# Patient Record
Sex: Female | Born: 1975 | Race: Black or African American | Hispanic: No | Smoking: Former smoker
Health system: Southern US, Community
[De-identification: ages and names within clinical notes are randomized; demographics above are authoritative.]

## PROBLEM LIST (undated history)

## (undated) ENCOUNTER — Inpatient Hospital Stay (HOSPITAL_COMMUNITY): Payer: Self-pay

## (undated) DIAGNOSIS — E079 Disorder of thyroid, unspecified: Secondary | ICD-10-CM

## (undated) DIAGNOSIS — F319 Bipolar disorder, unspecified: Secondary | ICD-10-CM

## (undated) DIAGNOSIS — I10 Essential (primary) hypertension: Secondary | ICD-10-CM

## (undated) DIAGNOSIS — E119 Type 2 diabetes mellitus without complications: Secondary | ICD-10-CM

## (undated) DIAGNOSIS — G709 Myoneural disorder, unspecified: Secondary | ICD-10-CM

## (undated) DIAGNOSIS — F419 Anxiety disorder, unspecified: Secondary | ICD-10-CM

## (undated) DIAGNOSIS — G4700 Insomnia, unspecified: Secondary | ICD-10-CM

## (undated) DIAGNOSIS — F329 Major depressive disorder, single episode, unspecified: Secondary | ICD-10-CM

## (undated) DIAGNOSIS — L508 Other urticaria: Secondary | ICD-10-CM

## (undated) DIAGNOSIS — F32A Depression, unspecified: Secondary | ICD-10-CM

## (undated) HISTORY — PX: THYROID SURGERY: SHX805

## (undated) HISTORY — PX: TUBAL LIGATION: SHX77

## (undated) HISTORY — PX: OTHER SURGICAL HISTORY: SHX169

---

## 2013-03-21 ENCOUNTER — Encounter (HOSPITAL_COMMUNITY): Payer: Self-pay | Admitting: Emergency Medicine

## 2013-03-21 ENCOUNTER — Emergency Department (HOSPITAL_COMMUNITY)
Admission: EM | Admit: 2013-03-21 | Discharge: 2013-03-21 | Disposition: A | Discharge status: Home / Self Care | Payer: Medicare Other | Attending: Emergency Medicine | Admitting: Emergency Medicine

## 2013-03-21 ENCOUNTER — Emergency Department (HOSPITAL_COMMUNITY): Payer: Medicare Other

## 2013-03-21 DIAGNOSIS — M545 Low back pain, unspecified: Secondary | ICD-10-CM | POA: Insufficient documentation

## 2013-03-21 DIAGNOSIS — E079 Disorder of thyroid, unspecified: Secondary | ICD-10-CM | POA: Insufficient documentation

## 2013-03-21 DIAGNOSIS — R319 Hematuria, unspecified: Secondary | ICD-10-CM | POA: Insufficient documentation

## 2013-03-21 DIAGNOSIS — F319 Bipolar disorder, unspecified: Secondary | ICD-10-CM | POA: Insufficient documentation

## 2013-03-21 DIAGNOSIS — M549 Dorsalgia, unspecified: Secondary | ICD-10-CM

## 2013-03-21 DIAGNOSIS — I10 Essential (primary) hypertension: Secondary | ICD-10-CM | POA: Insufficient documentation

## 2013-03-21 DIAGNOSIS — F3289 Other specified depressive episodes: Secondary | ICD-10-CM | POA: Insufficient documentation

## 2013-03-21 DIAGNOSIS — Z3202 Encounter for pregnancy test, result negative: Secondary | ICD-10-CM | POA: Insufficient documentation

## 2013-03-21 DIAGNOSIS — F329 Major depressive disorder, single episode, unspecified: Secondary | ICD-10-CM | POA: Insufficient documentation

## 2013-03-21 DIAGNOSIS — R109 Unspecified abdominal pain: Secondary | ICD-10-CM | POA: Insufficient documentation

## 2013-03-21 DIAGNOSIS — F17200 Nicotine dependence, unspecified, uncomplicated: Secondary | ICD-10-CM | POA: Insufficient documentation

## 2013-03-21 DIAGNOSIS — Z880 Allergy status to penicillin: Secondary | ICD-10-CM | POA: Insufficient documentation

## 2013-03-21 DIAGNOSIS — F411 Generalized anxiety disorder: Secondary | ICD-10-CM | POA: Insufficient documentation

## 2013-03-21 HISTORY — DX: Insomnia, unspecified: G47.00

## 2013-03-21 HISTORY — DX: Bipolar disorder, unspecified: F31.9

## 2013-03-21 HISTORY — DX: Major depressive disorder, single episode, unspecified: F32.9

## 2013-03-21 HISTORY — DX: Anxiety disorder, unspecified: F41.9

## 2013-03-21 HISTORY — DX: Depression, unspecified: F32.A

## 2013-03-21 HISTORY — DX: Essential (primary) hypertension: I10

## 2013-03-21 HISTORY — DX: Disorder of thyroid, unspecified: E07.9

## 2013-03-21 LAB — CBC WITH DIFFERENTIAL/PLATELET
Basophils Absolute: 0 10*3/uL (ref 0.0–0.1)
Basophils Relative: 0 % (ref 0–1)
Eosinophils Relative: 3 % (ref 0–5)
HCT: 44.8 % (ref 36.0–46.0)
Lymphocytes Relative: 23 % (ref 12–46)
MCHC: 33.3 g/dL (ref 30.0–36.0)
MCV: 79.6 fL (ref 78.0–100.0)
Monocytes Absolute: 0.4 10*3/uL (ref 0.1–1.0)
Monocytes Relative: 5 % (ref 3–12)
RDW: 15.2 % (ref 11.5–15.5)

## 2013-03-21 LAB — URINALYSIS, ROUTINE W REFLEX MICROSCOPIC
Ketones, ur: NEGATIVE mg/dL
Leukocytes, UA: NEGATIVE
Nitrite: NEGATIVE
Protein, ur: 100 mg/dL — AB

## 2013-03-21 LAB — BASIC METABOLIC PANEL
BUN: 13 mg/dL (ref 6–23)
CO2: 25 mEq/L (ref 19–32)
Calcium: 9.4 mg/dL (ref 8.4–10.5)
Creatinine, Ser: 0.91 mg/dL (ref 0.50–1.10)

## 2013-03-21 MED ORDER — OXYCODONE-ACETAMINOPHEN 5-325 MG PO TABS: 2.0000 | ORAL_TABLET | ORAL | Status: DC | PRN

## 2013-03-21 MED ORDER — METHOCARBAMOL 750 MG PO TABS: 750.0000 mg | ORAL_TABLET | Freq: Four times a day (QID) | ORAL | Status: DC

## 2013-03-21 MED ORDER — IBUPROFEN 200 MG PO TABS
600.0000 mg | ORAL_TABLET | Freq: Once | ORAL | Status: AC
  Administered 2013-03-21: 600 mg via ORAL
  Filled 2013-03-21: qty 3

## 2013-03-21 NOTE — ED Provider Notes (Signed)
CSN: [id number]     Arrival date & time 03/21/13  1637 History     First MD Initiated Contact with Patient 03/21/13 1713     Chief Complaint  Patient presents with  . Flank Pain   (Consider location/radiation/quality/duration/timing/severity/associated sxs/prior Treatment) Patient is a 37 y.o. female presenting with flank pain. The history is provided by the patient.  Flank Pain   patient here complaining of bilateral lower lumbar pain x8 weeks characterized as sharp and worse with standing. Also notes intermittent hematuria without fever chills or vomiting. No treatment used prior to arrival. Has not been seen by a provider. Symptoms have been intermittent and nothing makes them worse. No vaginal bleeding or discharge. No abdominal pain. Denies any dysuria. No prior history of same. Denies any colicky component to her symptoms  Past Medical History  Diagnosis Date  . Hypertension   . Anxiety   . Depression   . Bipolar 1 disorder   . Insomnia   . Thyroid disease    Past Surgical History  Procedure Laterality Date  . Thyroid surgery     No family history on file. History  Substance Use Topics  . Smoking status: Current Every Day Smoker -- 0.50 packs/day    Types: Cigarettes  . Smokeless tobacco: Not on file  . Alcohol Use: No   OB History   Grav Para Term Preterm Abortions TAB SAB Ect Mult Living                 Review of Systems  Genitourinary: Positive for flank pain.  All other systems reviewed and are negative.    Allergies  Penicillins  Home Medications   Current Outpatient Rx  Name  Route  Sig  Dispense  Refill  . Aspirin-Salicylamide-Caffeine (BC HEADACHE PO)   Oral   Take 2 packets by mouth every 4 (four) hours.          BP 149/94  Pulse 89  Temp(Src) 98.8 F (37.1 C) (Oral)  Resp 18  SpO2 97% Physical Exam  Nursing note and vitals reviewed. Constitutional: She is oriented to person, place, and time. She appears well-developed and  well-nourished.  Non-toxic appearance. No distress.  HENT:  Head: Normocephalic and atraumatic.  Eyes: Conjunctivae, EOM and lids are normal. Pupils are equal, round, and reactive to light.  Neck: Normal range of motion. Neck supple. No tracheal deviation present. No mass present.  Cardiovascular: Normal rate, regular rhythm and normal heart sounds.  Exam reveals no gallop.   No murmur heard. Pulmonary/Chest: Effort normal and breath sounds normal. No stridor. No respiratory distress. She has no decreased breath sounds. She has no wheezes. She has no rhonchi. She has no rales.  Abdominal: Soft. Normal appearance and bowel sounds are normal. She exhibits no distension. There is no tenderness. There is no rebound and no CVA tenderness.  Musculoskeletal: Normal range of motion. She exhibits no edema and no tenderness.       Arms: Neurological: She is alert and oriented to person, place, and time. She has normal strength. No cranial nerve deficit or sensory deficit. GCS eye subscore is 4. GCS verbal subscore is 5. GCS motor subscore is 6.  Skin: Skin is warm and dry. No abrasion and no rash noted.  Psychiatric: She has a normal mood and affect. Her speech is normal and behavior is normal.    ED Course   Procedures (including critical care time)  Labs Reviewed  URINALYSIS, ROUTINE W REFLEX MICROSCOPIC  No results found. No diagnosis found.  MDM  Pt with likely musculoskeletal back pain, no evidence of uti, stable for d/c  Toy Baker, MD 03/21/13 [phone]

## 2013-03-21 NOTE — ED Notes (Signed)
Pt states that she has bilat flank pain and blood in her urine that has been going on for 8 weeks. Pt gets Depo shot and thought it was her menstrual cycle.  Pt doesn't think it is now since the blood is only when she urinates.  Pt also c/o headaches and was getting injections in her head from neurologist but that was while she lived in [place] and has now moved to [place].

## 2013-04-27 ENCOUNTER — Emergency Department (HOSPITAL_COMMUNITY)
Admission: EM | Admit: 2013-04-27 | Discharge: 2013-04-28 | Disposition: A | Discharge status: Home / Self Care | Payer: Medicare Other | Attending: Emergency Medicine | Admitting: Emergency Medicine

## 2013-04-27 DIAGNOSIS — K089 Disorder of teeth and supporting structures, unspecified: Secondary | ICD-10-CM | POA: Insufficient documentation

## 2013-04-27 DIAGNOSIS — Z862 Personal history of diseases of the blood and blood-forming organs and certain disorders involving the immune mechanism: Secondary | ICD-10-CM | POA: Insufficient documentation

## 2013-04-27 DIAGNOSIS — Z8669 Personal history of other diseases of the nervous system and sense organs: Secondary | ICD-10-CM | POA: Insufficient documentation

## 2013-04-27 DIAGNOSIS — Z8639 Personal history of other endocrine, nutritional and metabolic disease: Secondary | ICD-10-CM | POA: Insufficient documentation

## 2013-04-27 DIAGNOSIS — Z8659 Personal history of other mental and behavioral disorders: Secondary | ICD-10-CM | POA: Insufficient documentation

## 2013-04-27 DIAGNOSIS — Z792 Long term (current) use of antibiotics: Secondary | ICD-10-CM | POA: Insufficient documentation

## 2013-04-27 DIAGNOSIS — K047 Periapical abscess without sinus: Secondary | ICD-10-CM | POA: Insufficient documentation

## 2013-04-27 DIAGNOSIS — R599 Enlarged lymph nodes, unspecified: Secondary | ICD-10-CM | POA: Insufficient documentation

## 2013-04-27 DIAGNOSIS — Z880 Allergy status to penicillin: Secondary | ICD-10-CM | POA: Insufficient documentation

## 2013-04-27 DIAGNOSIS — I10 Essential (primary) hypertension: Secondary | ICD-10-CM | POA: Insufficient documentation

## 2013-04-27 DIAGNOSIS — Z79899 Other long term (current) drug therapy: Secondary | ICD-10-CM | POA: Insufficient documentation

## 2013-04-27 DIAGNOSIS — F17200 Nicotine dependence, unspecified, uncomplicated: Secondary | ICD-10-CM | POA: Insufficient documentation

## 2013-04-28 ENCOUNTER — Encounter (HOSPITAL_COMMUNITY): Payer: Self-pay | Admitting: Family Medicine

## 2013-04-28 MED ORDER — CLINDAMYCIN HCL 300 MG PO CAPS: 300.0000 mg | ORAL_CAPSULE | Freq: Three times a day (TID) | ORAL | Status: DC

## 2013-04-28 MED ORDER — HYDROCODONE-ACETAMINOPHEN 5-325 MG PO TABS
2.0000 | ORAL_TABLET | Freq: Once | ORAL | Status: AC
  Administered 2013-04-28: 2 via ORAL
  Filled 2013-04-28: qty 2

## 2013-04-28 MED ORDER — HYDROCODONE-ACETAMINOPHEN 5-325 MG PO TABS: 1.0000 | ORAL_TABLET | ORAL | Status: DC | PRN

## 2013-04-28 NOTE — ED Provider Notes (Signed)
Medical screening examination/treatment/procedure(s) were performed by non-physician practitioner and as supervising physician I was immediately available for consultation/collaboration.  Martel Galvan R. Zema Lizardo, MD 04/28/13 0712 

## 2013-04-28 NOTE — ED Notes (Signed)
Patient states that she "has 4 bad teeth, one has broken off and I have a bubble on my gum." C/o left face and jaw pain. Has taken clove oil, tea tree oil, soaked in whiskey and BC powder without relief of symptoms.

## 2013-04-28 NOTE — ED Provider Notes (Signed)
CSN: [id number]     Arrival date & time 04/27/13  2248 History   None    Chief Complaint  Patient presents with  . Abscess   (Consider location/radiation/quality/duration/timing/severity/associated sxs/prior Treatment) HPI History provided by pt.   Pt presents w/ severe L upper dental pain w/ radiation to jaw and entire L side of face x 2 days.  Broke this tooth in her sleep last week.  Has been applying clove oil, tea tree oil, cotton soaked in whiskey and bc powder w/out relief.  No associated fever, nasal congestion, sore throat.  Does not currently have a dentist.  Past Medical History  Diagnosis Date  . Hypertension   . Anxiety   . Depression   . Bipolar 1 disorder   . Insomnia   . Thyroid disease    Past Surgical History  Procedure Laterality Date  . Thyroid surgery     No family history on file. History  Substance Use Topics  . Smoking status: Current Every Day Smoker -- 0.50 packs/day    Types: Cigarettes  . Smokeless tobacco: Not on file  . Alcohol Use: No   OB History   Grav Para Term Preterm Abortions TAB SAB Ect Mult Living                 Review of Systems  All other systems reviewed and are negative.    Allergies  Penicillins  Home Medications   Current Outpatient Rx  Name  Route  Sig  Dispense  Refill  . Aspirin-Salicylamide-Caffeine (BC HEADACHE PO)   Oral   Take 2 packets by mouth every 4 (four) hours.         . clindamycin (CLEOCIN) 300 MG capsule   Oral   Take 1 capsule (300 mg total) by mouth 3 (three) times daily.   21 capsule   0   . HYDROcodone-acetaminophen (NORCO/VICODIN) 5-325 MG per tablet   Oral   Take 1 tablet by mouth every 4 (four) hours as needed for pain.   20 tablet   0   . methocarbamol (ROBAXIN-750) 750 MG tablet   Oral   Take 1 tablet (750 mg total) by mouth 4 (four) times daily.   30 tablet   0   . oxyCODONE-acetaminophen (PERCOCET/ROXICET) 5-325 MG per tablet   Oral   Take 2 tablets by mouth every 4  (four) hours as needed for pain.   16 tablet   0    BP 144/93  Pulse 83  Temp(Src) 99.1 F (37.3 C) (Oral)  Resp 14  SpO2 100%  LMP 04/27/2013 Physical Exam  Nursing note and vitals reviewed. Constitutional: She is oriented to person, place, and time. She appears well-developed and well-nourished. No distress.  HENT:  Head: Normocephalic and atraumatic.  Left upper second premolar w/ Rennis Harding type 3 fracture and advanced carie.  Severely ttp w/ guarding.  Diffusely poor dentition and gingivitis.  No edema buccal mucosa.  Diffuse L-facial tenderness.  Full ROM jaw w/out pain.   Eyes:  Normal appearance  Neck: Normal range of motion.  Cardiovascular: Normal rate.   Pulmonary/Chest: Effort normal and breath sounds normal.  Musculoskeletal: Normal range of motion.  Lymphadenopathy:    She has cervical adenopathy.  Neurological: She is alert and oriented to person, place, and time.  Psychiatric: She has a normal mood and affect. Her behavior is normal.    ED Course  Procedures (including critical care time) Labs Review Labs Reviewed - No data to display Imaging  Review No results found.  MDM   1. Periapical abscess    Healthy 36yo F presents w/ L upper dental pain.  Exam concerning for periapical abscess.  Prescribed clindamycin and hydrocodone and dentist referral line provided.  Return precautions discussed. 2:46 AM     Otilio Miu, PA-C 04/28/13 [phone]

## 2013-08-19 ENCOUNTER — Encounter (HOSPITAL_COMMUNITY): Payer: Self-pay | Admitting: Emergency Medicine

## 2013-08-19 DIAGNOSIS — F17200 Nicotine dependence, unspecified, uncomplicated: Secondary | ICD-10-CM | POA: Insufficient documentation

## 2013-08-19 DIAGNOSIS — E079 Disorder of thyroid, unspecified: Secondary | ICD-10-CM | POA: Insufficient documentation

## 2013-08-19 DIAGNOSIS — F411 Generalized anxiety disorder: Secondary | ICD-10-CM | POA: Insufficient documentation

## 2013-08-19 DIAGNOSIS — G4700 Insomnia, unspecified: Secondary | ICD-10-CM | POA: Insufficient documentation

## 2013-08-19 DIAGNOSIS — I10 Essential (primary) hypertension: Secondary | ICD-10-CM | POA: Insufficient documentation

## 2013-08-19 DIAGNOSIS — IMO0002 Reserved for concepts with insufficient information to code with codable children: Secondary | ICD-10-CM | POA: Insufficient documentation

## 2013-08-19 DIAGNOSIS — T7840XA Allergy, unspecified, initial encounter: Secondary | ICD-10-CM | POA: Insufficient documentation

## 2013-08-19 DIAGNOSIS — F329 Major depressive disorder, single episode, unspecified: Secondary | ICD-10-CM | POA: Insufficient documentation

## 2013-08-19 DIAGNOSIS — Y929 Unspecified place or not applicable: Secondary | ICD-10-CM | POA: Insufficient documentation

## 2013-08-19 DIAGNOSIS — F3289 Other specified depressive episodes: Secondary | ICD-10-CM | POA: Insufficient documentation

## 2013-08-19 DIAGNOSIS — Y999 Unspecified external cause status: Secondary | ICD-10-CM | POA: Insufficient documentation

## 2013-08-19 DIAGNOSIS — F319 Bipolar disorder, unspecified: Secondary | ICD-10-CM | POA: Insufficient documentation

## 2013-08-19 NOTE — ED Notes (Signed)
This evening about 3pm she noticed her arms looked swollen.  Painful to touch.  She noticed this after she was holding her daughter

## 2013-08-20 ENCOUNTER — Emergency Department (HOSPITAL_COMMUNITY)
Admission: EM | Admit: 2013-08-20 | Discharge: 2013-08-20 | Disposition: A | Discharge status: Home / Self Care | Payer: Medicare Other | Attending: Emergency Medicine | Admitting: Emergency Medicine

## 2013-08-20 DIAGNOSIS — T7840XA Allergy, unspecified, initial encounter: Secondary | ICD-10-CM

## 2013-08-20 MED ORDER — PREDNISONE 20 MG PO TABS
60.0000 mg | ORAL_TABLET | Freq: Once | ORAL | Status: AC
  Administered 2013-08-20: 60 mg via ORAL
  Filled 2013-08-20: qty 3

## 2013-08-20 MED ORDER — FAMOTIDINE 20 MG PO TABS
20.0000 mg | ORAL_TABLET | Freq: Once | ORAL | Status: AC
  Administered 2013-08-20: 20 mg via ORAL
  Filled 2013-08-20: qty 1

## 2013-08-20 MED ORDER — PREDNISONE 20 MG PO TABS
20.0000 mg | ORAL_TABLET | Freq: Every day | ORAL | Status: DC
Start: 2013-08-20 — End: 2013-10-24

## 2013-08-20 MED ORDER — HYDROCODONE-ACETAMINOPHEN 5-325 MG PO TABS: 1.0000 | ORAL_TABLET | ORAL | Status: DC | PRN

## 2013-08-20 NOTE — Discharge Instructions (Signed)
Take prednisone as prescribed.  Take pepcid AC twice a day for the next 3 days and benadryl at least twice, up to four times a day, for the next three days.  You should also elevate and use cold compresses.  Avoid scratching if possible.  Return to the ER if you develop fever, pain worsens or swelling/redness spreads.

## 2013-08-20 NOTE — ED Provider Notes (Signed)
CSN: [id number]     Arrival date & time 08/19/13  2327 History   None    Chief Complaint  Patient presents with  . Swollen Arms    (Consider location/radiation/quality/duration/timing/severity/associated sxs/prior Treatment) HPI History provided by pt.   Pt developed pruritis, pain and edema of bilateral proximal forearms at 3pm today.  Sx have gradually worsened and pain aggravated by elbow flexion and palpation.  Has not taken anything for sx.  No associated lip/tonge edema, throat tightness, fever, other skin changes.  Only known allergy is penicillin and no new contacts.   Past Medical History  Diagnosis Date  . Hypertension   . Anxiety   . Depression   . Bipolar 1 disorder   . Insomnia   . Thyroid disease    Past Surgical History  Procedure Laterality Date  . Thyroid surgery     History reviewed. No pertinent family history. History  Substance Use Topics  . Smoking status: Current Every Day Smoker -- 0.50 packs/day    Types: Cigarettes  . Smokeless tobacco: Not on file  . Alcohol Use: No   OB History   Grav Para Term Preterm Abortions TAB SAB Ect Mult Living                 Review of Systems  All other systems reviewed and are negative.    Allergies  Penicillins  Home Medications   Current Outpatient Rx  Name  Route  Sig  Dispense  Refill  . Aspirin-Salicylamide-Caffeine (BC HEADACHE PO)   Oral   Take 2 packets by mouth every 4 (four) hours.         . clindamycin (CLEOCIN) 300 MG capsule   Oral   Take 1 capsule (300 mg total) by mouth 3 (three) times daily.   21 capsule   0   . HYDROcodone-acetaminophen (NORCO/VICODIN) 5-325 MG per tablet   Oral   Take 1 tablet by mouth every 4 (four) hours as needed for pain.   20 tablet   0   . HYDROcodone-acetaminophen (NORCO/VICODIN) 5-325 MG per tablet   Oral   Take 1 tablet by mouth every 4 (four) hours as needed for moderate pain.   6 tablet   0   . methocarbamol (ROBAXIN-750) 750 MG tablet   Oral  Take 1 tablet (750 mg total) by mouth 4 (four) times daily.   30 tablet   0   . oxyCODONE-acetaminophen (PERCOCET/ROXICET) 5-325 MG per tablet   Oral   Take 2 tablets by mouth every 4 (four) hours as needed for pain.   16 tablet   0   . predniSONE (DELTASONE) 20 MG tablet   Oral   Take 1 tablet (20 mg total) by mouth daily.   10 tablet   0    BP 153/90  Pulse 91  Temp(Src) 98.1 F (36.7 C) (Oral)  Resp 18  Ht 5\' 6"  (1.676 m)  Wt 177 lb (80.287 kg)  BMI 28.58 kg/m2  SpO2 100%  LMP 08/19/2013 Physical Exam  Nursing note and vitals reviewed. Constitutional: She is oriented to person, place, and time. She appears well-developed and well-nourished. No distress.  HENT:  Head: Normocephalic and atraumatic.  Mouth/Throat: Oropharynx is clear and moist and mucous membranes are normal. No posterior oropharyngeal edema.  No lip or tongue edema.  Eyes:  Normal appearance  Neck: Normal range of motion.  Cardiovascular: Normal rate and regular rhythm.   Pulmonary/Chest: Effort normal and breath sounds normal. No stridor. No  respiratory distress.  Musculoskeletal: Normal range of motion.  Edema proximal aspect of flexor surface of both left and right forearms w/ overlying erythema.  Mildly ttp.  2+ radial pulse and distal sensation intact bilaterally.      Neurological: She is alert and oriented to person, place, and time.  Skin: Skin is warm and dry. No rash noted.  Psychiatric: She has a normal mood and affect. Her behavior is normal.    ED Course  Procedures (including critical care time) Labs Review Labs Reviewed - No data to display Imaging Review No results found.  EKG Interpretation   None       MDM   1. Allergic reaction, initial encounter    38yo F presents w/ atraumatic edema, pruritis, pain and erythema bilateral proximal forearms.  Suspect allergic contact dermatitis.  Pt received prednisone and pepcid in ED.  She will pick up benadryl on way home.   Recommended ice and elevation and prescribed 6 vicodin for the pain, that I suspect is secondary to the edema.  Strict return precautions discussed.  1:24 AM   Remer Macho, PA-C 08/20/13 0124

## 2013-08-21 NOTE — ED Provider Notes (Signed)
Medical screening examination/treatment/procedure(s) were performed by non-physician practitioner and as supervising physician I was immediately available for consultation/collaboration.  EKG Interpretation   None         Julianne Rice, MD 08/21/13 [phone]

## 2013-10-24 ENCOUNTER — Inpatient Hospital Stay (HOSPITAL_COMMUNITY)
Admission: AD | Admit: 2013-10-24 | Discharge: 2013-10-24 | Disposition: A | Discharge status: Home / Self Care | Payer: Medicare Other | Source: Ambulatory Visit | Attending: Obstetrics & Gynecology | Admitting: Obstetrics & Gynecology

## 2013-10-24 ENCOUNTER — Encounter (HOSPITAL_COMMUNITY): Payer: Self-pay | Admitting: *Deleted

## 2013-10-24 DIAGNOSIS — Z87898 Personal history of other specified conditions: Secondary | ICD-10-CM

## 2013-10-24 DIAGNOSIS — IMO0002 Reserved for concepts with insufficient information to code with codable children: Secondary | ICD-10-CM

## 2013-10-24 DIAGNOSIS — R300 Dysuria: Secondary | ICD-10-CM | POA: Insufficient documentation

## 2013-10-24 DIAGNOSIS — N888 Other specified noninflammatory disorders of cervix uteri: Secondary | ICD-10-CM

## 2013-10-24 LAB — URINALYSIS, ROUTINE W REFLEX MICROSCOPIC
Bilirubin Urine: NEGATIVE
Glucose, UA: NEGATIVE mg/dL
HGB URINE DIPSTICK: NEGATIVE
Ketones, ur: NEGATIVE mg/dL
Leukocytes, UA: NEGATIVE
NITRITE: NEGATIVE
PH: 5.5 (ref 5.0–8.0)
Protein, ur: NEGATIVE mg/dL
UROBILINOGEN UA: 0.2 mg/dL (ref 0.0–1.0)

## 2013-10-24 LAB — WET PREP, GENITAL
CLUE CELLS WET PREP: NONE SEEN
TRICH WET PREP: NONE SEEN
Yeast Wet Prep HPF POC: NONE SEEN

## 2013-10-24 LAB — COMPREHENSIVE METABOLIC PANEL
ALT: 14 U/L (ref 0–35)
AST: 15 U/L (ref 0–37)
Albumin: 3.5 g/dL (ref 3.5–5.2)
Alkaline Phosphatase: 100 U/L (ref 39–117)
BUN: 9 mg/dL (ref 6–23)
CHLORIDE: 103 meq/L (ref 96–112)
CO2: 24 meq/L (ref 19–32)
CREATININE: 0.71 mg/dL (ref 0.50–1.10)
Calcium: 9.1 mg/dL (ref 8.4–10.5)
GFR calc Af Amer: >90 mL/min (ref >90)
GLUCOSE: 102 mg/dL — AB (ref 70–99)
Potassium: 3.9 mEq/L (ref 3.7–5.3)
Sodium: 138 mEq/L (ref 137–147)
Total Protein: 7 g/dL (ref 6.0–8.3)

## 2013-10-24 LAB — POCT PREGNANCY, URINE: Preg Test, Ur: NEGATIVE

## 2013-10-24 MED ORDER — PHENAZOPYRIDINE HCL 100 MG PO TABS
100.0000 mg | ORAL_TABLET | Freq: Once | ORAL | Status: DC
Start: 2013-10-24 — End: 2013-10-24

## 2013-10-24 NOTE — MAU Provider Note (Signed)
Attestation of Attending Supervision of Advanced Practitioner (CNM/NP): Evaluation and management procedures were performed by the Advanced Practitioner under my supervision and collaboration. I have reviewed the Advanced Practitioner's note and chart, and I agree with the management and plan.  Urijah Arko H. 10:42 PM

## 2013-10-24 NOTE — MAU Note (Signed)
Pt presents with complaints of having UTI symptoms for approximately 2 months, burning with urination, pain with intercourse but she has been evaluated at a physicians office here in [place] but the pain is still there and is not getting any better.

## 2013-10-24 NOTE — MAU Provider Note (Signed)
History     CSN: [id number]  Arrival date and time: 10/24/13 0867   First Provider Initiated Contact with Patient 10/24/13 0913      No chief complaint on file.  HPI  Crystal Mora is a 38 y.o. female who presents to MAU with "problems with my kidneys". Pt has been going back and fourth to her [facility]?) for the last 2 months for a "kidney infection". Pt was placed on antibiotics; followed up in 2 weeks, urine was unchanged and patient's antibiotics were changed again. Pt again, went back 3 weeks later and was told again that she had a kidney infection. Pt was given a third antibiotic and tramadol for pain.   The pain is in her back; bilateral and in her lower abdomen. She has slight burning during intercourse and urination.  Pt feels she is urinating less than normal. She drinks 3-4 bottle of water per day along with tea; she urinates 1-2 times per day.    OB History   Grav Para Term Preterm Abortions TAB SAB Ect Mult Living   10 8 3 5 2 2  0 0 0 8      Past Medical History  Diagnosis Date  . Hypertension   . Anxiety   . Depression   . Bipolar 1 disorder   . Insomnia   . Thyroid disease     Past Surgical History  Procedure Laterality Date  . Thyroid surgery    . Cesarean section    . Tubal ligation      History reviewed. No pertinent family history.  History  Substance Use Topics  . Smoking status: Current Every Day Smoker -- 0.50 packs/day    Types: Cigarettes  . Smokeless tobacco: Not on file  . Alcohol Use: No    Allergies:  Allergies  Allergen Reactions  . Penicillins Hives and Itching    childhood  . Vicodin [Hydrocodone-Acetaminophen] Hives and Itching    Pt states tolerated with benadryl    Prescriptions prior to admission  Medication Sig Dispense Refill  . amitriptyline (ELAVIL) 10 MG tablet Take 30 mg by mouth at bedtime.      . Aspirin-Salicylamide-Caffeine (BC HEADACHE PO) Take 2 packets by mouth daily as needed  (headaches).       Marland Kitchen OVER THE COUNTER MEDICATION Take 1 each by mouth daily. Pt states she uses an OTC B-Complex powder daily.      Marland Kitchen PRESCRIPTION MEDICATION Take 1 tablet by mouth 2 (two) times daily. Pt states she is taking a medication to treat Hypertension; unknown drug/strength pharmacy has no record of BP medications      . QUEtiapine Fumarate (SEROQUEL XR) 150 MG 24 hr tablet Take 75 mg by mouth every morning.      . traMADol (ULTRAM) 50 MG tablet Take 50 mg by mouth every 6 (six) hours as needed for moderate pain.      . traZODone (DESYREL) 150 MG tablet Take 150 mg by mouth at bedtime.       Results for orders placed during the hospital encounter of 10/24/13 (from the past 48 hour(s))  URINALYSIS, ROUTINE W REFLEX MICROSCOPIC     Status: Abnormal   Collection Time    10/24/13  8:20 AM      Result Value Ref Range   Color, Urine YELLOW  YELLOW   APPearance CLEAR  CLEAR   Specific Gravity, Urine >1.030 (*) 1.005 - 1.030   pH 5.5  5.0 - 8.0  Glucose, UA NEGATIVE  NEGATIVE mg/dL   Hgb urine dipstick NEGATIVE  NEGATIVE   Bilirubin Urine NEGATIVE  NEGATIVE   Ketones, ur NEGATIVE  NEGATIVE mg/dL   Protein, ur NEGATIVE  NEGATIVE mg/dL   Urobilinogen, UA 0.2  0.0 - 1.0 mg/dL   Nitrite NEGATIVE  NEGATIVE   Leukocytes, UA NEGATIVE  NEGATIVE   Comment: MICROSCOPIC NOT DONE ON URINES WITH NEGATIVE PROTEIN, BLOOD, LEUKOCYTES, NITRITE, OR GLUCOSE <1000 mg/dL.  POCT PREGNANCY, URINE     Status: None   Collection Time    10/24/13  8:51 AM      Result Value Ref Range   Preg Test, Ur NEGATIVE  NEGATIVE   Comment:            THE SENSITIVITY OF THIS     METHODOLOGY IS >24 mIU/mL  WET PREP, GENITAL     Status: Abnormal   Collection Time    10/24/13  9:20 AM      Result Value Ref Range   Yeast Wet Prep HPF POC NONE SEEN  NONE SEEN   Trich, Wet Prep NONE SEEN  NONE SEEN   Clue Cells Wet Prep HPF POC NONE SEEN  NONE SEEN   WBC, Wet Prep HPF POC FEW (*) NONE SEEN   Comment: FEW BACTERIA SEEN    COMPREHENSIVE METABOLIC PANEL     Status: Abnormal   Collection Time    10/24/13 10:25 AM      Result Value Ref Range   Sodium 138  137 - 147 mEq/L   Potassium 3.9  3.7 - 5.3 mEq/L   Chloride 103  96 - 112 mEq/L   CO2 24  19 - 32 mEq/L   Glucose, Bld 102 (*) 70 - 99 mg/dL   BUN 9  6 - 23 mg/dL   Creatinine, Ser 0.71  0.50 - 1.10 mg/dL   Calcium 9.1  8.4 - 10.5 mg/dL   Total Protein 7.0  6.0 - 8.3 g/dL   Albumin 3.5  3.5 - 5.2 g/dL   AST 15  0 - 37 U/L   ALT 14  0 - 35 U/L   Alkaline Phosphatase 100  39 - 117 U/L   Total Bilirubin <0.2 (*) 0.3 - 1.2 mg/dL   Comment: REPEATED TO VERIFY   GFR calc non Af Amer >90  >90 mL/min   GFR calc Af Amer >90  >90 mL/min   Comment: (NOTE)     The eGFR has been calculated using the CKD EPI equation.     This calculation has not been validated in all clinical situations.     eGFR's persistently <90 mL/min signify possible Chronic Kidney     Disease.    Review of Systems  Constitutional: Negative for fever and chills.  Gastrointestinal: Positive for abdominal pain (+ Bilateral lower abdominal pain ). Negative for nausea and vomiting.  Musculoskeletal: Positive for back pain (+Bilateral lower back pain ).   Physical Exam   Blood pressure 132/71, pulse 92, temperature 98.7 F (37.1 C), temperature source Oral, resp. rate 18, last menstrual period 09/28/2013.  Physical Exam  Constitutional: She is oriented to person, place, and time. She appears well-developed and well-nourished. No distress.  HENT:  Head: Normocephalic.  Eyes: Pupils are equal, round, and reactive to light.  Neck: Neck supple.  GI: Soft. There is no CVA tenderness.  Genitourinary: Cervix exhibits friability. Cervix exhibits no motion tenderness and no discharge.    Speculum exam: Vagina - Small amount of creamy  discharge, no odor Cervix - + contact bleeding, cervix appears friable  Bimanual exam: Cervix closed, no CMT  Uterus non tender, normal size Adnexa non  tender, no masses bilaterally GC/Chlam, wet prep done Chaperone present for exam.  2 mm size cyst like lesion at 10 o'clock on the cervix; non tender to touch; pale color.    Musculoskeletal: Normal range of motion.  Neurological: She is alert and oriented to person, place, and time.  Skin: Skin is warm. She is not diaphoretic.  Psychiatric: Her behavior is normal.    MAU Course  Procedures None  MDM Wet prep UA: no sign of infection. High SG.  Upt HSV culture  CMP; normal kidney function . Cyst likely a nabothian cyst; low suspicion for genital herpes; culture sent as a precautionary.   Assessment and Plan   Assessment:  1. History of painful urination   2. Friable cervix    Plan:  Discharge home in stable condition Pt has a PCP and is encouraged to follow up with PCP regarding painful urination if continues   Darrelyn Hillock Rasch, NP  10/24/2013, 5:16 PM

## 2013-10-24 NOTE — Discharge Instructions (Signed)
Dysuria Dysuria is the medical term for pain with urination. There are many causes for dysuria, but urinary tract infection is the most common. If a urinalysis was performed it can show that there is a urinary tract infection. A urine culture confirms that you or your child is sick. You will need to follow up with a healthcare provider because:  If a urine culture was done you will need to know the culture results and treatment recommendations.  If the urine culture was positive, you or your child will need to be put on antibiotics or know if the antibiotics prescribed are the right antibiotics for your urinary tract infection.  If the urine culture is negative (no urinary tract infection), then other causes may need to be explored or antibiotics need to be stopped. Today laboratory work may have been done and there does not seem to be an infection. If cultures were done they will take at least 24 to 48 hours to be completed. Today x-rays may have been taken and they read as normal. No cause can be found for the problems. The x-rays may be re-read by a radiologist and you will be contacted if additional findings are made. You or your child may have been put on medications to help with this problem until you can see your primary caregiver. If the problems get better, see your primary caregiver if the problems return. If you were given antibiotics (medications which kill germs), take all of the mediations as directed for the full course of treatment.  If laboratory work was done, you need to find the results. Leave a telephone number where you can be reached. If this is not possible, make sure you find out how you are to get test results. HOME CARE INSTRUCTIONS   Drink lots of fluids. For adults, drink eight, 8 ounce glasses of clear juice or water a day. For children, replace fluids as suggested by your caregiver.  Empty the bladder often. Avoid holding urine for long periods of time.  After a bowel  movement, women should cleanse front to back, using each tissue only once.  Empty your bladder before and after sexual intercourse.  Take all the medicine given to you until it is gone. You may feel better in a few days, but TAKE ALL MEDICINE.  Avoid caffeine, tea, alcohol and carbonated beverages, because they tend to irritate the bladder.  In men, alcohol may irritate the prostate.  Only take over-the-counter or prescription medicines for pain, discomfort, or fever as directed by your caregiver.  If your caregiver has given you a follow-up appointment, it is very important to keep that appointment. Not keeping the appointment could result in a chronic or permanent injury, pain, and disability. If there is any problem keeping the appointment, you must call back to this facility for assistance. SEEK IMMEDIATE MEDICAL CARE IF:   Back pain develops.  A fever develops.  There is nausea (feeling sick to your stomach) or vomiting (throwing up).  Problems are no better with medications or are getting worse. MAKE SURE YOU:   Understand these instructions.  Will watch your condition.  Will get help right away if you are not doing well or get worse. Document Released: 04/27/2004 Document Revised: 10/22/2011 Document Reviewed: 03/04/2008 [facility] Patient Information 2014 [place].  Urine Culture Collection, Female  You will collect a sample of pee (urine) in a cup. Read the instructions below before beginning. If you have any questions, ask the nurse before you  begin. Follow the instructions carefully. 1. Wash your hands with soap and water and dry them thoroughly. 2. Open the lid of the cup. Be careful not to touch the inside. 3. Clean the private (genital) area.  Sit over the toilet. Use the fingers of one hand to separate and hold open the folds of the skin in your private area.  Clean the pee (urinary) opening and surrounding area with the gauze, wiping from front to back.  Throw away the gauze in the trash, not the toilet. Repeat this step two times. 4. With the folds of skin still separated, pee a small amount into toilet. STOP, then pee into the cup. Fill the cup half way. 5. Put the lid on the cup tightly. 6. Wash your hands with soap and water. 7. If you were given a label, put the label on the cup. 8. Give the cup to the nurse. Document Released: 07/12/2008 Document Revised: 07/16/2012 Document Reviewed: 07/12/2008 [facility] Patient Information 2014 [place].

## 2013-10-25 LAB — URINE CULTURE: Special Requests: NORMAL

## 2013-10-26 LAB — GC/CHLAMYDIA PROBE AMP
CT PROBE, AMP APTIMA: NEGATIVE
GC PROBE AMP APTIMA: NEGATIVE

## 2013-10-26 LAB — HERPES SIMPLEX VIRUS CULTURE
Culture: NOT DETECTED
Special Requests: NORMAL

## 2014-01-27 ENCOUNTER — Emergency Department (HOSPITAL_COMMUNITY)
Admission: EM | Admit: 2014-01-27 | Discharge: 2014-01-27 | Disposition: A | Discharge status: Home / Self Care | Payer: Medicare Other | Attending: Emergency Medicine | Admitting: Emergency Medicine

## 2014-01-27 ENCOUNTER — Encounter (HOSPITAL_COMMUNITY): Payer: Self-pay | Admitting: Emergency Medicine

## 2014-01-27 DIAGNOSIS — F411 Generalized anxiety disorder: Secondary | ICD-10-CM | POA: Insufficient documentation

## 2014-01-27 DIAGNOSIS — IMO0002 Reserved for concepts with insufficient information to code with codable children: Secondary | ICD-10-CM | POA: Diagnosis not present

## 2014-01-27 DIAGNOSIS — I10 Essential (primary) hypertension: Secondary | ICD-10-CM | POA: Diagnosis not present

## 2014-01-27 DIAGNOSIS — F3289 Other specified depressive episodes: Secondary | ICD-10-CM | POA: Diagnosis not present

## 2014-01-27 DIAGNOSIS — F17200 Nicotine dependence, unspecified, uncomplicated: Secondary | ICD-10-CM | POA: Diagnosis not present

## 2014-01-27 DIAGNOSIS — G4700 Insomnia, unspecified: Secondary | ICD-10-CM | POA: Insufficient documentation

## 2014-01-27 DIAGNOSIS — Z79899 Other long term (current) drug therapy: Secondary | ICD-10-CM | POA: Diagnosis not present

## 2014-01-27 DIAGNOSIS — M79603 Pain in arm, unspecified: Secondary | ICD-10-CM

## 2014-01-27 DIAGNOSIS — Z862 Personal history of diseases of the blood and blood-forming organs and certain disorders involving the immune mechanism: Secondary | ICD-10-CM | POA: Insufficient documentation

## 2014-01-27 DIAGNOSIS — Z8639 Personal history of other endocrine, nutritional and metabolic disease: Secondary | ICD-10-CM | POA: Insufficient documentation

## 2014-01-27 DIAGNOSIS — M79609 Pain in unspecified limb: Secondary | ICD-10-CM | POA: Diagnosis not present

## 2014-01-27 DIAGNOSIS — Z880 Allergy status to penicillin: Secondary | ICD-10-CM | POA: Insufficient documentation

## 2014-01-27 DIAGNOSIS — F329 Major depressive disorder, single episode, unspecified: Secondary | ICD-10-CM | POA: Insufficient documentation

## 2014-01-27 DIAGNOSIS — L0291 Cutaneous abscess, unspecified: Secondary | ICD-10-CM

## 2014-01-27 MED ORDER — OXYCODONE-ACETAMINOPHEN 5-325 MG PO TABS: 2.0000 | ORAL_TABLET | Freq: Four times a day (QID) | ORAL | Status: DC | PRN

## 2014-01-27 MED ORDER — SULFAMETHOXAZOLE-TRIMETHOPRIM 800-160 MG PO TABS: 1.0000 | ORAL_TABLET | Freq: Two times a day (BID) | ORAL | Status: DC

## 2014-01-27 MED ORDER — ENOXAPARIN SODIUM 100 MG/ML ~~LOC~~ SOLN
1.0000 mg/kg | Freq: Once | SUBCUTANEOUS | Status: AC
  Administered 2014-01-27: 80 mg via SUBCUTANEOUS
  Filled 2014-01-27: qty 1

## 2014-01-27 NOTE — Discharge Instructions (Signed)

## 2014-01-27 NOTE — ED Provider Notes (Signed)
CSN: [id number]     Arrival date & time 01/27/14  1929 History  This chart was scribed for non-physician practitioner Montine Circle, PA-C working with Richarda Blade, MD by Eston Mould, ED Scribe. This patient was seen in room TR09C/TR09C and the patient's care was started at 8:34 PM .   Chief Complaint  Patient presents with  . Abscess   The history is provided by the patient. No language interpreter was used.   HPI Comments: Crystal Mora is a 38 y.o. female who presents to the Emergency Department complaining of abscess to L axilla. Pt states she had a "hair bump" to her L axilla; states she popped the bump and is still having drainage. She also c/o pain with extension; states she is unable to extend her L arm to baseline due to having unusual pain. She has not taken anything to alleviate her symptoms. Denies fevers, chills, nausea, or vomiting.  Past Medical History  Diagnosis Date  . Hypertension   . Anxiety   . Depression   . Bipolar 1 disorder   . Insomnia   . Thyroid disease    Past Surgical History  Procedure Laterality Date  . Thyroid surgery    . Cesarean section    . Tubal ligation     History reviewed. No pertinent family history. History  Substance Use Topics  . Smoking status: Current Every Day Smoker -- 0.50 packs/day    Types: Cigarettes  . Smokeless tobacco: Not on file  . Alcohol Use: No   OB History   Grav Para Term Preterm Abortions TAB SAB Ect Mult Living   10 8 3 5 2 2  0 0 0 8     Review of Systems  Constitutional: Negative for fever and chills.  Respiratory: Negative for shortness of breath.   Cardiovascular: Negative for chest pain.  Gastrointestinal: Negative for nausea, vomiting, diarrhea and constipation.  Genitourinary: Negative for dysuria.  Skin: Positive for wound.    Allergies  Penicillins and Vicodin  Home Medications   Prior to Admission medications   Medication Sig Start Date End Date Taking? Authorizing  Provider  amitriptyline (ELAVIL) 10 MG tablet Take 30 mg by mouth at bedtime.    Historical Provider, MD  Aspirin-Salicylamide-Caffeine (BC HEADACHE PO) Take 2 packets by mouth daily as needed (headaches).     Historical Provider, MD  OVER THE COUNTER MEDICATION Take 1 each by mouth daily. Pt states she uses an OTC B-Complex powder daily.    Historical Provider, MD  PRESCRIPTION MEDICATION Take 1 tablet by mouth 2 (two) times daily. Pt states she is taking a medication to treat Hypertension; unknown drug/strength pharmacy has no record of BP medications    Historical Provider, MD  QUEtiapine Fumarate (SEROQUEL XR) 150 MG 24 hr tablet Take 75 mg by mouth every morning.    Historical Provider, MD  traMADol (ULTRAM) 50 MG tablet Take 50 mg by mouth every 6 (six) hours as needed for moderate pain.    Historical Provider, MD  traZODone (DESYREL) 150 MG tablet Take 150 mg by mouth at bedtime.    Historical Provider, MD   BP 140/88  Pulse 95  Temp(Src) 98.9 F (37.2 C) (Oral)  Resp 16  Wt 176 lb 14.4 oz (80.241 kg)  SpO2 100%  Physical Exam  Nursing note and vitals reviewed. Constitutional: She is oriented to person, place, and time. She appears well-developed and well-nourished. No distress.  HENT:  Head: Normocephalic and atraumatic.  Eyes: EOM are  normal.  Neck: Neck supple. No tracheal deviation present.  Cardiovascular: Normal rate.   Pulmonary/Chest: Effort normal. No respiratory distress.  Musculoskeletal: Normal range of motion.  L arm pain with full extension and moderate tenderness to palpation over the medial aspect. No erythema.  Neurological: She is alert and oriented to person, place, and time.  Skin: Skin is warm and dry.  1 x 4 cm abscess to the L axilla with mild discharge. No surrounding erythema or cellulitis.   Psychiatric: She has a normal mood and affect. Her behavior is normal.   ED Course  Procedures (including critical care time) DIAGNOSTIC STUDIES: Oxygen  Saturation is 100% on RA, normal by my interpretation.    COORDINATION OF CARE: 8:38 PM-Discussed treatment plan which includes speak with attending for further tx. Pt agreed to plan.   9:22 PM-Dr. Jeneen Rinks evaluated pt.  Some concern for upper extremity DVT.  Will treat with lovenox and drain abscess.  Recommend returning tomorrow for [place].  9:46 PM- INCISION AND DRAINAGE Performed by: Montine Circle, PA-C Consent: Verbal consent obtained. Risks and benefits: risks, benefits and alternatives were discussed  Sterile Prep and Drape  Type: abscess  Body area: L axilla  Local anesthetic: lidocaine 2 % with epinephrine  Anesthetic total: 2.5 ml  Incision: 11 Blade  Complexity: complex Blunt dissection to breakup loculations  Drainage amount: minimal   Flushed with copious amount of sterile saline  Patient tolerance: Patient tolerated the procedure well with no immediate complications.  9:58 PM-Will discharge with pain medication and abx. Informed pt to return tomorrow morning for a repeat ultrasound. Pt agreed to plan.  Labs Review Labs Reviewed - No data to display  Imaging Review No results found.   EKG Interpretation None     MDM   Final diagnoses:  Abscess  Arm pain    Patient with abscess in the left axilla. This was drained in the emergency department. She also has some tenderness over the left arm, with a palpable venous cord. Concern for extremity DVT. Patient seen by and discussed with Dr. Jeneen Rinks. Will treat with Lovenox. Recommend ultrasound morning.  I personally performed the services described in this documentation, which was scribed in my presence. The recorded information has been reviewed and is accurate.     Montine Circle, PA-C 01/28/14 [phone]

## 2014-01-27 NOTE — ED Provider Notes (Signed)
Patient seen and evaluated. I agree with incision and drainage of her axillary abscess that appears to be secondary to her hidradenitis supperativa.  Tender cord down her arm. On limited bedside ultrasound does not show obvious noncompressible vein. However, we'll ask her to return in the a.m. for formal ultrasound. We'll give single dose Lovenox tonight until definitive study in the morning.  Tanna Furry, MD 01/27/14 2127

## 2014-01-27 NOTE — ED Notes (Signed)
PA assistant  at bedside.

## 2014-01-27 NOTE — ED Notes (Signed)
Pt states a hair follicle was infected under her left arm. Pt states that it popped and drained. Pt states foul smell as well. Pt has applied ointment and tried OTC medications with no relief from pain.

## 2014-01-27 NOTE — ED Notes (Signed)
PA at bedside.

## 2014-01-28 ENCOUNTER — Other Ambulatory Visit (HOSPITAL_COMMUNITY): Payer: Self-pay | Admitting: Emergency Medicine

## 2014-01-28 ENCOUNTER — Ambulatory Visit (HOSPITAL_COMMUNITY)
Admission: RE | Admit: 2014-01-28 | Discharge: 2014-01-28 | Disposition: A | Discharge status: Home / Self Care | Payer: Medicare Other | Source: Ambulatory Visit | Attending: Diagnostic Radiology | Admitting: Diagnostic Radiology

## 2014-01-28 DIAGNOSIS — M79609 Pain in unspecified limb: Secondary | ICD-10-CM

## 2014-01-28 DIAGNOSIS — M25529 Pain in unspecified elbow: Secondary | ICD-10-CM

## 2014-01-28 NOTE — Progress Notes (Signed)
*  Preliminary Results* Left upper extremity venous duplex completed. Left upper extremity is negative for deep and superficial vein thrombosis.  01/28/2014 9:55 AM  Maudry Mayhew, RVT, RDCS, RDMS

## 2014-01-31 NOTE — ED Provider Notes (Signed)
Medical screening examination/treatment/procedure(s) were conducted as a shared visit with non-physician practitioner(s) and myself.  I personally evaluated the patient during the encounter.   EKG Interpretation None      Patient seen and evaluated.  Discussed care with PA R. Browning.  Agree with I&D.  Pt to return in am for Doppler.  Single dose lovenox given.  Tanna Furry, MD 01/31/14 [phone]

## 2014-06-14 ENCOUNTER — Encounter (HOSPITAL_COMMUNITY): Payer: Self-pay | Admitting: Emergency Medicine

## 2014-08-28 ENCOUNTER — Emergency Department (HOSPITAL_COMMUNITY): Payer: Medicare Other

## 2014-08-28 ENCOUNTER — Emergency Department (HOSPITAL_COMMUNITY)
Admission: EM | Admit: 2014-08-28 | Discharge: 2014-08-28 | Disposition: A | Discharge status: Home / Self Care | Payer: Medicare Other | Attending: Emergency Medicine | Admitting: Emergency Medicine

## 2014-08-28 ENCOUNTER — Encounter (HOSPITAL_COMMUNITY): Payer: Self-pay

## 2014-08-28 DIAGNOSIS — R0602 Shortness of breath: Secondary | ICD-10-CM | POA: Diagnosis not present

## 2014-08-28 DIAGNOSIS — F319 Bipolar disorder, unspecified: Secondary | ICD-10-CM | POA: Insufficient documentation

## 2014-08-28 DIAGNOSIS — Z3202 Encounter for pregnancy test, result negative: Secondary | ICD-10-CM | POA: Diagnosis not present

## 2014-08-28 DIAGNOSIS — F419 Anxiety disorder, unspecified: Secondary | ICD-10-CM | POA: Diagnosis not present

## 2014-08-28 DIAGNOSIS — I10 Essential (primary) hypertension: Secondary | ICD-10-CM | POA: Insufficient documentation

## 2014-08-28 DIAGNOSIS — Z880 Allergy status to penicillin: Secondary | ICD-10-CM | POA: Insufficient documentation

## 2014-08-28 DIAGNOSIS — R05 Cough: Secondary | ICD-10-CM | POA: Insufficient documentation

## 2014-08-28 DIAGNOSIS — Z720 Tobacco use: Secondary | ICD-10-CM | POA: Diagnosis not present

## 2014-08-28 DIAGNOSIS — Z7982 Long term (current) use of aspirin: Secondary | ICD-10-CM | POA: Insufficient documentation

## 2014-08-28 DIAGNOSIS — Z79899 Other long term (current) drug therapy: Secondary | ICD-10-CM | POA: Insufficient documentation

## 2014-08-28 DIAGNOSIS — R109 Unspecified abdominal pain: Secondary | ICD-10-CM | POA: Diagnosis not present

## 2014-08-28 DIAGNOSIS — Z8639 Personal history of other endocrine, nutritional and metabolic disease: Secondary | ICD-10-CM | POA: Diagnosis not present

## 2014-08-28 DIAGNOSIS — M545 Low back pain, unspecified: Secondary | ICD-10-CM

## 2014-08-28 DIAGNOSIS — G4700 Insomnia, unspecified: Secondary | ICD-10-CM | POA: Insufficient documentation

## 2014-08-28 DIAGNOSIS — R059 Cough, unspecified: Secondary | ICD-10-CM

## 2014-08-28 LAB — URINALYSIS, ROUTINE W REFLEX MICROSCOPIC
Bilirubin Urine: NEGATIVE
Glucose, UA: NEGATIVE mg/dL
Hgb urine dipstick: NEGATIVE
Ketones, ur: NEGATIVE mg/dL
Nitrite: NEGATIVE
PH: 6 (ref 5.0–8.0)
Protein, ur: NEGATIVE mg/dL
SPECIFIC GRAVITY, URINE: 1.023 (ref 1.005–1.030)
UROBILINOGEN UA: 0.2 mg/dL (ref 0.0–1.0)

## 2014-08-28 LAB — CBC WITH DIFFERENTIAL/PLATELET
Basophils Absolute: 0 10*3/uL (ref 0.0–0.1)
Basophils Relative: 0 % (ref 0–1)
EOS PCT: 2 % (ref 0–5)
Eosinophils Absolute: 0.1 10*3/uL (ref 0.0–0.7)
HEMATOCRIT: 37.3 % (ref 36.0–46.0)
HEMOGLOBIN: 11.9 g/dL — AB (ref 12.0–15.0)
LYMPHS PCT: 18 % (ref 12–46)
Lymphs Abs: 1.3 10*3/uL (ref 0.7–4.0)
MCH: 23.6 pg — ABNORMAL LOW (ref 26.0–34.0)
MCHC: 31.9 g/dL (ref 30.0–36.0)
MCV: 74 fL — ABNORMAL LOW (ref 78.0–100.0)
MONO ABS: 0.4 10*3/uL (ref 0.1–1.0)
MONOS PCT: 6 % (ref 3–12)
NEUTROS ABS: 5.1 10*3/uL (ref 1.7–7.7)
Neutrophils Relative %: 74 % (ref 43–77)
Platelets: 207 10*3/uL (ref 150–400)
RBC: 5.04 MIL/uL (ref 3.87–5.11)
RDW: 15.7 % — ABNORMAL HIGH (ref 11.5–15.5)
WBC: 6.9 10*3/uL (ref 4.0–10.5)

## 2014-08-28 LAB — URINE MICROSCOPIC-ADD ON

## 2014-08-28 LAB — BASIC METABOLIC PANEL
ANION GAP: 6 (ref 5–15)
BUN: 5 mg/dL — AB (ref 6–23)
CHLORIDE: 105 meq/L (ref 96–112)
CO2: 27 mmol/L (ref 19–32)
Calcium: 8.7 mg/dL (ref 8.4–10.5)
Creatinine, Ser: 0.74 mg/dL (ref 0.50–1.10)
GFR calc Af Amer: >90 mL/min (ref >90)
GFR calc non Af Amer: >90 mL/min (ref >90)
Glucose, Bld: 91 mg/dL (ref 70–99)
Potassium: 3.4 mmol/L — ABNORMAL LOW (ref 3.5–5.1)
SODIUM: 138 mmol/L (ref 135–145)

## 2014-08-28 LAB — D-DIMER, QUANTITATIVE: D-Dimer, Quant: 0.39 ug/mL-FEU (ref 0.00–0.48)

## 2014-08-28 LAB — PREGNANCY, URINE: Preg Test, Ur: NEGATIVE

## 2014-08-28 MED ORDER — OXYCODONE-ACETAMINOPHEN 5-325 MG PO TABS: 2.0000 | ORAL_TABLET | ORAL | Status: DC | PRN

## 2014-08-28 MED ORDER — IBUPROFEN 800 MG PO TABS: 800.0000 mg | ORAL_TABLET | Freq: Three times a day (TID) | ORAL | Status: DC

## 2014-08-28 NOTE — ED Notes (Signed)
Pt reports she has been having right side flank pain and shortness of breath X2 days. Pt denies pain/burning during urination. Ambulatory to room B19.

## 2014-08-28 NOTE — Discharge Instructions (Signed)

## 2014-08-28 NOTE — ED Provider Notes (Signed)
CSN: [id number]     Arrival date & time 08/28/14  7902 History   First MD Initiated Contact with Patient 08/28/14 0757     Chief Complaint  Patient presents with  . Flank Pain  . Shortness of Breath     (Consider location/radiation/quality/duration/timing/severity/associated sxs/prior Treatment) Patient is a 39 y.o. female presenting with back pain. The history is provided by the patient. No language interpreter was used.  Back Pain Location:  Lumbar spine Quality:  Aching Radiates to:  Does not radiate Pain severity:  Moderate Onset quality:  Gradual Timing:  Constant Progression:  Worsening Chronicity:  New Relieved by:  Nothing Worsened by:  Nothing tried Ineffective treatments:  None tried Associated symptoms: no abdominal pain, no abdominal swelling, no chest pain, no dysuria and no fever   Risk factors: not pregnant   Pt complains of pain in her right back.   Pt reports she feels like she has a kidney infection but she also has pain when she takes a deep breath.  Past Medical History  Diagnosis Date  . Hypertension   . Anxiety   . Depression   . Bipolar 1 disorder   . Insomnia   . Thyroid disease    Past Surgical History  Procedure Laterality Date  . Thyroid surgery    . Cesarean section    . Tubal ligation     History reviewed. No pertinent family history. History  Substance Use Topics  . Smoking status: Current Every Day Smoker -- 0.50 packs/day    Types: Cigarettes  . Smokeless tobacco: Not on file  . Alcohol Use: Yes     Comment: Rare   OB History    Gravida Para Term Preterm AB TAB SAB Ectopic Multiple Living   10 8 3 5 2 2  0 0 0 8     Review of Systems  Constitutional: Negative for fever.  Cardiovascular: Negative for chest pain.  Gastrointestinal: Negative for abdominal pain.  Genitourinary: Negative for dysuria.  Musculoskeletal: Positive for back pain.  All other systems reviewed and are negative.     Allergies  Penicillins and  Vicodin  Home Medications   Prior to Admission medications   Medication Sig Start Date End Date Taking? Authorizing Provider  amitriptyline (ELAVIL) 10 MG tablet Take 30 mg by mouth at bedtime.    Historical Provider, MD  Aspirin-Salicylamide-Caffeine (BC HEADACHE PO) Take 2 packets by mouth daily as needed (headaches).     Historical Provider, MD  OVER THE COUNTER MEDICATION Take 1 each by mouth daily. Pt states she uses an OTC B-Complex powder daily.    Historical Provider, MD  oxyCODONE-acetaminophen (PERCOCET/ROXICET) 5-325 MG per tablet Take 2 tablets by mouth every 6 (six) hours as needed for severe pain. 01/27/14   Montine Circle, PA-C  PRESCRIPTION MEDICATION Take 1 tablet by mouth 2 (two) times daily. Pt states she is taking a medication to treat Hypertension; unknown drug/strength pharmacy has no record of BP medications    Historical Provider, MD  QUEtiapine Fumarate (SEROQUEL XR) 150 MG 24 hr tablet Take 75 mg by mouth every morning.    Historical Provider, MD  sulfamethoxazole-trimethoprim (SEPTRA DS) 800-160 MG per tablet Take 1 tablet by mouth every 12 (twelve) hours. 01/27/14   Montine Circle, PA-C  traMADol (ULTRAM) 50 MG tablet Take 50 mg by mouth every 6 (six) hours as needed for moderate pain.    Historical Provider, MD  traZODone (DESYREL) 150 MG tablet Take 150 mg by mouth at bedtime.  Historical Provider, MD   BP 132/82 mmHg  Pulse 103  Temp(Src) 98.1 F (36.7 C) (Oral)  Resp 18  SpO2 99%  LMP 08/08/2014 Physical Exam  Constitutional: She is oriented to person, place, and time. She appears well-developed and well-nourished.  HENT:  Head: Normocephalic and atraumatic.  Right Ear: External ear normal.  Left Ear: External ear normal.  Nose: Nose normal.  Mouth/Throat: Oropharynx is clear and moist.  Eyes: Conjunctivae and EOM are normal. Pupils are equal, round, and reactive to light.  Neck: Normal range of motion.  Cardiovascular: Normal rate and normal heart  sounds.   Pulmonary/Chest: Effort normal.  Abdominal: She exhibits no distension.  Musculoskeletal:  Tender right flank, pain with movement  Neurological: She is alert and oriented to person, place, and time.  Skin: Skin is warm.  Psychiatric: She has a normal mood and affect.  Nursing note and vitals reviewed.   ED Course  Procedures (including critical care time) Labs Review Labs Reviewed - No data to display  Imaging Review Dg Chest 2 View  08/28/2014   CLINICAL DATA:  39 year old female with right-sided flank pain and shortness of breath for the past 2 days  EXAM: CHEST  2 VIEW  COMPARISON:  Prior CT abdomen/ pelvis 03/21/2013  FINDINGS: The lungs are clear and negative for focal airspace consolidation, pulmonary edema or suspicious pulmonary nodule. No pleural effusion or pneumothorax. Cardiac and mediastinal contours are within normal limits. No acute fracture or lytic or blastic osseous lesions. The visualized upper abdominal bowel gas pattern is unremarkable.  IMPRESSION: Negative chest x-ray.   Electronically Signed   By: Jacqulynn Cadet M.D.   On: 08/28/2014 09:17   D dimer is normal.  Urine no blood negative urine  pregancy  EKG Interpretation None     MDM   Final diagnoses:  Cough  Midline low back pain without sciatica    Pt given rx for ibuprofen and percocet Pt advised follow up with her Md for recheck    Fransico Meadow, PA-C 08/28/14 [place], MD 08/28/14 1430

## 2014-10-31 ENCOUNTER — Emergency Department (HOSPITAL_COMMUNITY)
Admission: EM | Admit: 2014-10-31 | Discharge: 2014-11-01 | Disposition: A | Discharge status: Home / Self Care | Payer: Medicare Other | Attending: Emergency Medicine | Admitting: Emergency Medicine

## 2014-10-31 DIAGNOSIS — Z79899 Other long term (current) drug therapy: Secondary | ICD-10-CM | POA: Diagnosis not present

## 2014-10-31 DIAGNOSIS — Z720 Tobacco use: Secondary | ICD-10-CM | POA: Insufficient documentation

## 2014-10-31 DIAGNOSIS — G4700 Insomnia, unspecified: Secondary | ICD-10-CM | POA: Diagnosis not present

## 2014-10-31 DIAGNOSIS — Z7982 Long term (current) use of aspirin: Secondary | ICD-10-CM | POA: Insufficient documentation

## 2014-10-31 DIAGNOSIS — F319 Bipolar disorder, unspecified: Secondary | ICD-10-CM | POA: Insufficient documentation

## 2014-10-31 DIAGNOSIS — Z8639 Personal history of other endocrine, nutritional and metabolic disease: Secondary | ICD-10-CM | POA: Diagnosis not present

## 2014-10-31 DIAGNOSIS — K047 Periapical abscess without sinus: Secondary | ICD-10-CM | POA: Diagnosis not present

## 2014-10-31 DIAGNOSIS — R519 Headache, unspecified: Secondary | ICD-10-CM

## 2014-10-31 DIAGNOSIS — I10 Essential (primary) hypertension: Secondary | ICD-10-CM | POA: Insufficient documentation

## 2014-10-31 DIAGNOSIS — F419 Anxiety disorder, unspecified: Secondary | ICD-10-CM | POA: Diagnosis not present

## 2014-10-31 DIAGNOSIS — R51 Headache: Secondary | ICD-10-CM | POA: Insufficient documentation

## 2014-11-01 ENCOUNTER — Encounter (HOSPITAL_COMMUNITY): Payer: Self-pay | Admitting: Emergency Medicine

## 2014-11-01 MED ORDER — CLINDAMYCIN HCL 150 MG PO CAPS: 300.0000 mg | ORAL_CAPSULE | Freq: Three times a day (TID) | ORAL | Status: DC

## 2014-11-01 MED ORDER — TRAMADOL HCL 50 MG PO TABS: 50.0000 mg | ORAL_TABLET | Freq: Four times a day (QID) | ORAL | Status: DC | PRN

## 2014-11-01 NOTE — Discharge Instructions (Signed)
Sinus Headache A sinus headache is when your sinuses become clogged or swollen. Sinus headaches can range from mild to severe.  CAUSES A sinus headache can have different causes, such as:  Colds.  Sinus infections.  Allergies. SYMPTOMS  Symptoms of a sinus headache may vary and can include:  Headache.  Pain or pressure in the face.  Congested or runny nose.  Fever.  Inability to smell.  Pain in upper teeth. Weather changes can make symptoms worse. TREATMENT  The treatment of a sinus headache depends on the cause.  Sinus pain caused by a sinus infection may be treated with antibiotic medicine.  Sinus pain caused by allergies may be helped by allergy medicines (antihistamines) and medicated nasal sprays.  Sinus pain caused by congestion may be helped by flushing the nose and sinuses with saline solution. HOME CARE INSTRUCTIONS   If antibiotics are prescribed, take them as directed. Finish them even if you start to feel better.  Only take over-the-counter or prescription medicines for pain, discomfort, or fever as directed by your caregiver.  If you have congestion, use a nasal spray to help reduce pressure. SEEK IMMEDIATE MEDICAL CARE IF:  You have a fever.  You have headaches more than once a week.  You have sensitivity to light or sound.  You have repeated nausea and vomiting.  You have vision problems.  You have sudden, severe pain in your face or head.  You have a seizure.  You are confused.  Your sinus headaches do not get better after treatment. Many people think they have a sinus headache when they actually have migraines or tension headaches. MAKE SURE YOU:   Understand these instructions.  Will watch your condition.  Will get help right away if you are not doing well or get worse. Document Released: 09/06/2004 Document Revised: 10/22/2011 Document Reviewed: 10/28/2010 [facility] Patient Information 2015 [place], [place]. This information is not  intended to replace advice given to you by your health care provider. Make sure you discuss any questions you have with your health care provider. Abscessed Tooth An abscessed tooth is an infection around your tooth. It may be caused by holes or damage to the tooth (cavity) or a dental disease. An abscessed tooth causes mild to very bad pain in and around the tooth. See your dentist right away if you have tooth or gum pain. HOME CARE  Take your medicine as told. Finish it even if you start to feel better.  Do not drive after taking pain medicine.  Rinse your mouth (gargle) often with salt water ( teaspoon salt in 8 ounces of warm water).  Do not apply heat to the outside of your face. GET HELP RIGHT AWAY IF:   You have a temperature by mouth above 102 F (38.9 C), not controlled by medicine.  You have chills and a very bad headache.  You have problems breathing or swallowing.  Your mouth will not open.  You develop puffiness (swelling) on the neck or around the eye.  Your pain is not helped by medicine.  Your pain is getting worse instead of better. MAKE SURE YOU:   Understand these instructions.  Will watch your condition.  Will get help right away if you are not doing well or get worse. Document Released: 01/16/2008 Document Revised: 10/22/2011 Document Reviewed: 11/07/2010 [facility] Patient Information 2015 [place], [place]. This information is not intended to replace advice given to you by your health care provider. Make sure you discuss any questions you have  with your health care provider. ° °

## 2014-11-01 NOTE — ED Notes (Signed)
C/o frontal headache and nausea since 3pm.  States she took her BP prior to arrival and it was elevated.  No neuro deficits noted.

## 2014-11-01 NOTE — ED Provider Notes (Signed)
CSN: [id number]     Arrival date & time 10/31/14  2355 History   First MD Initiated Contact with Patient 11/01/14 0002     Chief Complaint  Patient presents with  . Hypertension  . Headache     (Consider location/radiation/quality/duration/timing/severity/associated sxs/prior Treatment) Patient is a 39 y.o. female presenting with hypertension and headaches. The history is provided by the patient. No language interpreter was used.  Hypertension This is a new problem. Associated symptoms include headaches. Associated symptoms comments: She presents with concern over elevated blood pressure after multiple checks with a BP cuff at home. She has a history of pre-eclampsia but did not require ongoing medications for blood pressure. She denies CP, SOB. She has a headache she describes as frontal, worse with standing position and states it felt worse when her blood pressure was elevated and is still present but better now. No nausea, vomiting, vision changes. No syncope or near syncope. Marland Kitchen  Headache   Past Medical History  Diagnosis Date  . Anxiety   . Depression   . Bipolar 1 disorder   . Insomnia   . Thyroid disease   . Hypertension     states she has not took BP medication for 2 years   Past Surgical History  Procedure Laterality Date  . Thyroid surgery    . Cesarean section    . Tubal ligation     No family history on file. History  Substance Use Topics  . Smoking status: Current Every Day Smoker -- 0.50 packs/day    Types: Cigarettes  . Smokeless tobacco: Not on file  . Alcohol Use: Yes     Comment: Rare   OB History    Gravida Para Term Preterm AB TAB SAB Ectopic Multiple Living   10 8 3 5 2 2  0 0 0 8     Review of Systems  Neurological: Positive for headaches.      Allergies  Penicillins and Vicodin  Home Medications   Prior to Admission medications   Medication Sig Start Date End Date Taking? Authorizing Provider  amitriptyline (ELAVIL) 10 MG tablet Take 30  mg by mouth at bedtime.    Historical Provider, MD  Aspirin-Salicylamide-Caffeine (BC HEADACHE PO) Take 2 packets by mouth daily as needed (headaches).     Historical Provider, MD  buPROPion (WELLBUTRIN SR) 150 MG 12 hr tablet Take 150 mg by mouth daily.  07/09/14   Historical Provider, MD  busPIRone (BUSPAR) 10 MG tablet Take 10 mg by mouth 2 (two) times daily. 06/03/14   Historical Provider, MD  ibuprofen (ADVIL,MOTRIN) 800 MG tablet Take 1 tablet (800 mg total) by mouth 3 (three) times daily. 08/28/14   Fransico Meadow, PA-C  lamoTRIgine (LAMICTAL) 200 MG tablet Take 200 mg by mouth daily. 07/09/14   Historical Provider, MD  OVER THE COUNTER MEDICATION Take 1 each by mouth daily. Pt states she uses an OTC B-Complex powder daily.    Historical Provider, MD  oxyCODONE-acetaminophen (PERCOCET/ROXICET) 5-325 MG per tablet Take 2 tablets by mouth every 4 (four) hours as needed for severe pain. 08/28/14   Fransico Meadow, PA-C  PARoxetine (PAXIL) 20 MG tablet Take 20 mg by mouth daily. 07/09/14   Historical Provider, MD  QUEtiapine Fumarate (SEROQUEL XR) 150 MG 24 hr tablet Take 75 mg by mouth every morning.    Historical Provider, MD  sulfamethoxazole-trimethoprim (SEPTRA DS) 800-160 MG per tablet Take 1 tablet by mouth every 12 (twelve) hours. Patient not taking: Reported on  08/28/2014 01/27/14   Montine Circle, PA-C  traZODone (DESYREL) 150 MG tablet Take 150 mg by mouth at bedtime.    Historical Provider, MD  verapamil (CALAN-SR) 120 MG CR tablet Take 120 mg by mouth daily.  07/19/14   Historical Provider, MD   BP 137/89 mmHg  Pulse 90  Temp(Src) 98.3 F (36.8 C) (Oral)  Resp 18  Ht 5\' 5"  (1.651 m)  Wt 170 lb (77.111 kg)  BMI 28.29 kg/m2  SpO2 100%  LMP 10/25/2014 Physical Exam  Constitutional: She is oriented to person, place, and time. She appears well-developed and well-nourished. No distress.  HENT:  Head: Normocephalic and atraumatic.  Nose: Mucosal edema present. Right sinus exhibits  frontal sinus tenderness. Left sinus exhibits frontal sinus tenderness.  Abscess over #11.   Eyes: Conjunctivae are normal. Pupils are equal, round, and reactive to light.  Neck: Normal range of motion. Neck supple.  Cardiovascular: Normal rate.   No murmur heard. Pulmonary/Chest: Effort normal. She has no wheezes. She has no rales.  Abdominal: Soft. There is no tenderness.  Musculoskeletal: Normal range of motion.  Neurological: She is oriented to person, place, and time. Coordination normal.  CN's 3-12 grossly intact. No deficits of coordination.   Skin: Skin is warm and dry.  Psychiatric: She has a normal mood and affect.    ED Course  Procedures (including critical care time) Labs Review Labs Reviewed - No data to display  Imaging Review No results found.   EKG Interpretation None      MDM   Final diagnoses:  None    1. Sinus headache 2. Dental abscess  Blood pressure is normal here - no concern for hypertensive crisis. Headache appears to be sinus, possibly related to dental abscess but unclear. No fever. No facial swelling. Will treat with abx for abscess, provide pain relief (Ultram).     Charlann Lange, PA-C 11/01/14 0038  Veatrice Kells, MD 11/01/14 [phone]

## 2014-12-20 ENCOUNTER — Emergency Department (HOSPITAL_COMMUNITY)
Admission: EM | Admit: 2014-12-20 | Discharge: 2014-12-20 | Disposition: A | Discharge status: Home / Self Care | Payer: Medicare Other | Attending: Emergency Medicine | Admitting: Emergency Medicine

## 2014-12-20 ENCOUNTER — Encounter (HOSPITAL_COMMUNITY): Payer: Self-pay | Admitting: Emergency Medicine

## 2014-12-20 DIAGNOSIS — Z8639 Personal history of other endocrine, nutritional and metabolic disease: Secondary | ICD-10-CM | POA: Diagnosis not present

## 2014-12-20 DIAGNOSIS — Z880 Allergy status to penicillin: Secondary | ICD-10-CM | POA: Insufficient documentation

## 2014-12-20 DIAGNOSIS — F319 Bipolar disorder, unspecified: Secondary | ICD-10-CM | POA: Insufficient documentation

## 2014-12-20 DIAGNOSIS — I10 Essential (primary) hypertension: Secondary | ICD-10-CM | POA: Diagnosis not present

## 2014-12-20 DIAGNOSIS — G4700 Insomnia, unspecified: Secondary | ICD-10-CM | POA: Diagnosis not present

## 2014-12-20 DIAGNOSIS — F419 Anxiety disorder, unspecified: Secondary | ICD-10-CM | POA: Insufficient documentation

## 2014-12-20 DIAGNOSIS — Z792 Long term (current) use of antibiotics: Secondary | ICD-10-CM | POA: Insufficient documentation

## 2014-12-20 DIAGNOSIS — L500 Allergic urticaria: Secondary | ICD-10-CM | POA: Insufficient documentation

## 2014-12-20 DIAGNOSIS — Z720 Tobacco use: Secondary | ICD-10-CM | POA: Insufficient documentation

## 2014-12-20 DIAGNOSIS — Z79899 Other long term (current) drug therapy: Secondary | ICD-10-CM | POA: Insufficient documentation

## 2014-12-20 DIAGNOSIS — R21 Rash and other nonspecific skin eruption: Secondary | ICD-10-CM | POA: Diagnosis present

## 2014-12-20 DIAGNOSIS — L509 Urticaria, unspecified: Secondary | ICD-10-CM

## 2014-12-20 MED ORDER — FAMOTIDINE 20 MG PO TABS
10.0000 mg | ORAL_TABLET | Freq: Once | ORAL | Status: AC
  Administered 2014-12-20: 10 mg via ORAL
  Filled 2014-12-20: qty 1

## 2014-12-20 MED ORDER — PREDNISONE 20 MG PO TABS
20.0000 mg | ORAL_TABLET | Freq: Once | ORAL | Status: AC
  Administered 2014-12-20: 20 mg via ORAL
  Filled 2014-12-20: qty 1

## 2014-12-20 MED ORDER — LORATADINE 10 MG PO TABS: 10.0000 mg | ORAL_TABLET | Freq: Every day | ORAL | Status: DC

## 2014-12-20 MED ORDER — PREDNISONE 10 MG PO TABS
20.0000 mg | ORAL_TABLET | Freq: Every day | ORAL | Status: DC
Start: 2014-12-20 — End: 2014-12-29

## 2014-12-20 MED ORDER — FAMOTIDINE 20 MG PO TABS: 20.0000 mg | ORAL_TABLET | Freq: Two times a day (BID) | ORAL | Status: DC

## 2014-12-20 MED ORDER — LORATADINE 10 MG PO TABS
10.0000 mg | ORAL_TABLET | Freq: Every day | ORAL | Status: DC
  Administered 2014-12-20: 10 mg via ORAL
  Filled 2014-12-20: qty 1

## 2014-12-20 NOTE — Discharge Instructions (Signed)
Angioedema °Angioedema is a sudden swelling of tissues, often of the skin. It can occur on the face or genitals or in the abdomen or other body parts. The swelling usually develops over a short period and gets better in 24 to 48 hours. It often begins during the night and is found when the person wakes up. The person may also get red, itchy patches of skin (hives). Angioedema can be dangerous if it involves swelling of the air passages.  °Depending on the cause, episodes of angioedema may only happen once, come back in unpredictable patterns, or repeat for several years and then gradually fade away.  °CAUSES  °Angioedema can be caused by an allergic reaction to various triggers. It can also result from nonallergic causes, including reactions to drugs, immune system disorders, viral infections, or an abnormal gene that is passed to you from your parents (hereditary). For some people with angioedema, the cause is unknown.  °Some things that can trigger angioedema include:  °· Foods.   °· Medicines, such as ACE inhibitors, ARBs, nonsteroidal anti-inflammatory agents, or estrogen.   °· Latex.   °· Animal saliva.   °· Insect stings.   °· Dyes used in X-rays.   °· Mild injury.   °· Dental work. °· Surgery. °· Stress.   °· Sudden changes in temperature.   °· Exercise. °SIGNS AND SYMPTOMS  °· Swelling of the skin. °· Hives. If these are present, there is also intense itching. °· Redness in the affected area.   °· Pain in the affected area. °· Swollen lips or tongue. °· Breathing problems. This may happen if the air passages swell. °· Wheezing. °If internal organs are involved, there may be:  °· Nausea.   °· Abdominal pain.   °· Vomiting.   °· Difficulty swallowing.   °· Difficulty passing urine. °DIAGNOSIS  °· Your health care provider will examine the affected area and take a medical and family history. °· Various tests may be done to help determine the cause. Tests may include: °¨ Allergy skin tests to see if the problem  is an allergic reaction.   °¨ Blood tests to check for hereditary angioedema.   °¨ Tests to check for underlying diseases that could cause the condition.   °· A review of your medicines, including over-the-counter medicines, may be done. °TREATMENT  °Treatment will depend on the cause of the angioedema. Possible treatments include:  °· Removal of anything that triggered the condition (such as stopping certain medicines).   °· Medicines to treat symptoms or prevent attacks. Medicines given may include:   °¨ Antihistamines.   °¨ Epinephrine injection.   °¨ Steroids.   °· Hospitalization may be required for severe attacks. If the air passages are affected, it can be an emergency. Tubes may need to be placed to keep the airway open. °HOME CARE INSTRUCTIONS  °· Take all medicines as directed by your health care provider. °· If you were given medicines for emergency allergy treatment, always carry them with you. °· Wear a medical bracelet as directed by your health care provider.   °· Avoid known triggers. °SEEK MEDICAL CARE IF:  °· You have repeat attacks of angioedema.   °· Your attacks are more frequent or more severe despite preventive measures.   °· You have hereditary angioedema and are considering having children. It is important to discuss with your health care provider the risks of passing the condition on to your children. °SEEK IMMEDIATE MEDICAL CARE IF:  °· You have severe swelling of the mouth, tongue, or lips. °· You have difficulty breathing.   °· You have difficulty swallowing.   °· You faint. °MAKE   SURE YOU:  Understand these instructions.  Will watch your condition.  Will get help right away if you are not doing well or get worse. Document Released: 10/08/2001 Document Revised: 12/14/2013 Document Reviewed: 03/23/2013 [facility] Patient Information 2015 [place], [place]. This information is not intended to replace advice given to you by your health care provider. Make sure you discuss any questions  you have with your health care provider.  Please read the formation above. If you develop new worsening signs or symptoms please call [phone]. Please take medication as directed, follow up with her primary care provider this week or [facility] for further evaluation and management.

## 2014-12-20 NOTE — ED Notes (Signed)
Pt. reports allergic reaction to [place] food eaten 2 days ago , reports generalized itchy rashes and lips tingling , airway intact / respirations unlabored .

## 2014-12-20 NOTE — ED Notes (Signed)
Pt states she is currently on

## 2014-12-20 NOTE — ED Provider Notes (Signed)
CSN: [id number]     Arrival date & time 12/20/14  2133 History   None    Chief Complaint  Patient presents with  . Rash  . Allergic Reaction   HPI   39 year old female presents today with complaints of urticaria. Patient reports that Friday night after eating at a steakhouse she developed urticaria to her chest, back, arms, and buttock. She reports that  this has happened previously after consuming cheese. She put she was seen in the emergency room and discharged home with appropriate medication. She reports she believes that it was the shrimp she was eating, no previous reaction to shrimp. Addition to urticaria she reports tingling in her lips with minor swelling, no swelling of her tongue throat, denies difficulty breathing, shortness of breath, chest pain, increased salivation, changes in bowel or bladder functioning. She denies fever, exposure to abnormal food or drinks in addition to those noted above, no new exposures, no changes in medication, not taking any Ace inhibitors, no ibuprofen use. Patient denies headache, chest pain, changes in vision, abdominal pain, or any other concerning signs or symptoms. Ports that she try taking Benadryl at home with no relief of itching and hives. Patient questioned about pregnancy status reports she is currently on her menstrual cycle, states she is not pregnant.   Past Medical History  Diagnosis Date  . Anxiety   . Depression   . Bipolar 1 disorder   . Insomnia   . Thyroid disease   . Hypertension     states she has not took BP medication for 2 years   Past Surgical History  Procedure Laterality Date  . Thyroid surgery    . Cesarean section    . Tubal ligation     No family history on file. History  Substance Use Topics  . Smoking status: Current Every Day Smoker -- 0.50 packs/day    Types: Cigarettes  . Smokeless tobacco: Not on file  . Alcohol Use: Yes     Comment: Rare   OB History    Gravida Para Term Preterm AB TAB SAB Ectopic  Multiple Living   '10 8 3 5 2 2 '$ 0 0 0 8     Review of Systems  All other systems reviewed and are negative.   Allergies  Penicillins and Vicodin  Home Medications   Prior to Admission medications   Medication Sig Start Date End Date Taking? Authorizing Provider  amitriptyline (ELAVIL) 10 MG tablet Take 30 mg by mouth at bedtime.    Historical Provider, MD  Aspirin-Salicylamide-Caffeine (BC HEADACHE PO) Take 2 packets by mouth daily as needed (headaches).     Historical Provider, MD  buPROPion (WELLBUTRIN SR) 150 MG 12 hr tablet Take 150 mg by mouth daily.  07/09/14   Historical Provider, MD  busPIRone (BUSPAR) 10 MG tablet Take 10 mg by mouth 2 (two) times daily. 06/03/14   Historical Provider, MD  clindamycin (CLEOCIN) 150 MG capsule Take 2 capsules (300 mg total) by mouth 3 (three) times daily. 11/01/14   Charlann Lange, PA-C  ibuprofen (ADVIL,MOTRIN) 800 MG tablet Take 1 tablet (800 mg total) by mouth 3 (three) times daily. 08/28/14   Fransico Meadow, PA-C  lamoTRIgine (LAMICTAL) 200 MG tablet Take 200 mg by mouth daily. 07/09/14   Historical Provider, MD  OVER THE COUNTER MEDICATION Take 1 each by mouth daily. Pt states she uses an OTC B-Complex powder daily.    Historical Provider, MD  oxyCODONE-acetaminophen (PERCOCET/ROXICET) 5-325 MG per tablet Take  2 tablets by mouth every 4 (four) hours as needed for severe pain. 08/28/14   Fransico Meadow, PA-C  PARoxetine (PAXIL) 20 MG tablet Take 20 mg by mouth daily. 07/09/14   Historical Provider, MD  predniSONE (DELTASONE) 10 MG tablet Take 2 tablets (20 mg total) by mouth daily. 12/20/14   Okey Regal, PA-C  QUEtiapine Fumarate (SEROQUEL XR) 150 MG 24 hr tablet Take 75 mg by mouth every morning.    Historical Provider, MD  sulfamethoxazole-trimethoprim (SEPTRA DS) 800-160 MG per tablet Take 1 tablet by mouth every 12 (twelve) hours. Patient not taking: Reported on 08/28/2014 01/27/14   Montine Circle, PA-C  traMADol (ULTRAM) 50 MG tablet Take 1  tablet (50 mg total) by mouth every 6 (six) hours as needed. 11/01/14   Charlann Lange, PA-C  traZODone (DESYREL) 150 MG tablet Take 150 mg by mouth at bedtime.    Historical Provider, MD  verapamil (CALAN-SR) 120 MG CR tablet Take 120 mg by mouth daily.  07/19/14   Historical Provider, MD   BP 156/95 mmHg  Pulse 82  Temp(Src) 98.5 F (36.9 C) (Oral)  Resp 16  Ht '5\' 5"'$  (1.651 m)  Wt 170 lb (77.111 kg)  BMI 28.29 kg/m2  SpO2 99%  LMP 12/17/2014 Physical Exam  Constitutional: She is oriented to person, place, and time. She appears well-developed and well-nourished.  HENT:  Head: Normocephalic and atraumatic.  Mouth/Throat: Uvula is midline and mucous membranes are normal. No oral lesions. No trismus in the jaw. No uvula swelling. No oropharyngeal exudate, posterior oropharyngeal edema, posterior oropharyngeal erythema or tonsillar abscesses.  No visible swelling to lips, mouth, throat  Eyes: Pupils are equal, round, and reactive to light.  Neck: Normal range of motion. Neck supple. No JVD present. No tracheal deviation present. No thyromegaly present.  Cardiovascular: Normal rate, regular rhythm, normal heart sounds and intact distal pulses.  Exam reveals no gallop and no friction rub.   No murmur heard. Pulmonary/Chest: Effort normal and breath sounds normal. No stridor. No respiratory distress. She has no wheezes. She has no rales. She exhibits no tenderness.  Abdominal: Soft. There is no tenderness.  Musculoskeletal: Normal range of motion.  Lymphadenopathy:    She has no cervical adenopathy.  Neurological: She is alert and oriented to person, place, and time. Coordination normal.  Skin: Skin is warm and dry.  Urticaria to chest, arms, back, abdomen, buttocks, no signs of infection  Psychiatric: She has a normal mood and affect. Her behavior is normal. Judgment and thought content normal.  Nursing note and vitals reviewed.   ED Course  Procedures (including critical care  time) Labs Review Labs Reviewed - No data to display  Imaging Review No results found.   EKG Interpretation None      MDM   Final diagnoses:  Urticaria    Labs: None indicated  Imaging: None indicated  Consults: None indicated  Therapeutics: Loratadine, prednisone, famotidine  Assessment: Urticaria  Plan: Patient presents with urticaria, with complaints of angioedema to her lips. No obvious signs of angioedema to lips or oropharynx. Patient denies any respiratory complaints, no wheezing. Patient was treated with ranitidine, famotidine, prednisone. She is discharged home with instructions to continue using H1/ H2 antihistamine, and prednisone therapy. Patient is instructed to monitor for worsening signs symptoms including respiratory distress, swelling of the face mouth tongue, call 911 immediately if any of the above symptoms present. Patient is instructed follow-up with current health and wellness if urticaria symptoms continue to persist  in spite of outpatient therapy. Patient verbalized her understanding to today's plan, she verbalized understanding potentially lethal outcomes if follow-up evaluation does not taken. Patient is advised to avoid shrimp or any other foods that may cause a return of symptoms. Patient had no further questions at the time of discharge.      Okey Regal, PA-C 12/20/14 [place], MD 12/21/14 [phone]

## 2014-12-28 ENCOUNTER — Emergency Department (HOSPITAL_COMMUNITY)
Admission: EM | Admit: 2014-12-28 | Discharge: 2014-12-29 | Disposition: A | Discharge status: Home / Self Care | Payer: Medicare Other | Attending: Emergency Medicine | Admitting: Emergency Medicine

## 2014-12-28 ENCOUNTER — Encounter (HOSPITAL_COMMUNITY): Payer: Self-pay | Admitting: Emergency Medicine

## 2014-12-28 DIAGNOSIS — Z792 Long term (current) use of antibiotics: Secondary | ICD-10-CM | POA: Diagnosis not present

## 2014-12-28 DIAGNOSIS — Z8669 Personal history of other diseases of the nervous system and sense organs: Secondary | ICD-10-CM | POA: Diagnosis not present

## 2014-12-28 DIAGNOSIS — Z8639 Personal history of other endocrine, nutritional and metabolic disease: Secondary | ICD-10-CM | POA: Insufficient documentation

## 2014-12-28 DIAGNOSIS — R21 Rash and other nonspecific skin eruption: Secondary | ICD-10-CM | POA: Diagnosis present

## 2014-12-28 DIAGNOSIS — I10 Essential (primary) hypertension: Secondary | ICD-10-CM | POA: Diagnosis not present

## 2014-12-28 DIAGNOSIS — Z720 Tobacco use: Secondary | ICD-10-CM | POA: Insufficient documentation

## 2014-12-28 DIAGNOSIS — Z880 Allergy status to penicillin: Secondary | ICD-10-CM | POA: Insufficient documentation

## 2014-12-28 DIAGNOSIS — F419 Anxiety disorder, unspecified: Secondary | ICD-10-CM | POA: Diagnosis not present

## 2014-12-28 DIAGNOSIS — L509 Urticaria, unspecified: Secondary | ICD-10-CM | POA: Insufficient documentation

## 2014-12-28 DIAGNOSIS — R110 Nausea: Secondary | ICD-10-CM | POA: Insufficient documentation

## 2014-12-28 DIAGNOSIS — F319 Bipolar disorder, unspecified: Secondary | ICD-10-CM | POA: Diagnosis not present

## 2014-12-28 DIAGNOSIS — Z79899 Other long term (current) drug therapy: Secondary | ICD-10-CM | POA: Insufficient documentation

## 2014-12-28 MED ORDER — DIPHENHYDRAMINE HCL 25 MG PO CAPS
25.0000 mg | ORAL_CAPSULE | Freq: Once | ORAL | Status: AC
  Administered 2014-12-28: 25 mg via ORAL
  Filled 2014-12-28: qty 1

## 2014-12-28 MED ORDER — ONDANSETRON 4 MG PO TBDP
4.0000 mg | ORAL_TABLET | Freq: Once | ORAL | Status: AC
  Administered 2014-12-28: 4 mg via ORAL
  Filled 2014-12-28: qty 1

## 2014-12-28 MED ORDER — DIPHENHYDRAMINE HCL 50 MG/ML IJ SOLN
25.0000 mg | Freq: Once | INTRAMUSCULAR | Status: AC
  Administered 2014-12-28: 25 mg via INTRAVENOUS
  Filled 2014-12-28: qty 1

## 2014-12-28 MED ORDER — PREDNISONE 20 MG PO TABS
60.0000 mg | ORAL_TABLET | Freq: Once | ORAL | Status: AC
  Administered 2014-12-28: 60 mg via ORAL
  Filled 2014-12-28: qty 3

## 2014-12-28 MED ORDER — SODIUM CHLORIDE 0.9 % IV BOLUS (SEPSIS)
1000.0000 mL | Freq: Once | INTRAVENOUS | Status: AC
  Administered 2014-12-28: 1000 mL via INTRAVENOUS

## 2014-12-28 MED ORDER — ONDANSETRON HCL 4 MG/2ML IJ SOLN
4.0000 mg | Freq: Once | INTRAMUSCULAR | Status: AC
  Administered 2014-12-28: 4 mg via INTRAVENOUS
  Filled 2014-12-28: qty 2

## 2014-12-28 NOTE — ED Notes (Signed)
Pt. reports itchy rashes/hives at neck , arms and chest onset today unrelieved by OTC Benadryl , airway intact / respirations unlabored . Denies fever or chills.

## 2014-12-28 NOTE — ED Provider Notes (Signed)
CSN: [id number]     Arrival date & time 12/28/14  2103 History  This chart was scribed for Dahlia Bailiff, PA-C, working with Daleen Bo, MD by Girtha Hake, ED Scribe. The patient was seen in [facility]. The patient's care was started at 9:23 PM.     Chief Complaint  Patient presents with  . Urticaria   Patient is a 39 y.o. female presenting with urticaria. The history is provided by the patient. No language interpreter was used.  Urticaria Pertinent negatives include no chest pain, no abdominal pain and no shortness of breath.   HPI Comments: Crystal Mora is a 39 y.o. female who presents to the Emergency Department complaining of a rash on her arms, face, neck, and back beginning approximately 6-7 hours ago. She reports associated itching and also complains of nausea. Patient reports that she has taken Benadryl with no relief of her symptoms. She believes that she is having an allergic reaction but is unsure what caused it. Patient visited the ED last week for similar symptoms and was prescribed prednisone and Claritin. She reports that these medications were effective at treating her rash, but she reports when the medication regimen and that her symptoms have returned and worsened. She denies nausea, dysphagia, shortness of breath, vomiting, dizziness, lightheadedness, weakness. Patient reports hoarseness of her voice.  Past Medical History  Diagnosis Date  . Anxiety   . Depression   . Bipolar 1 disorder   . Insomnia   . Thyroid disease   . Hypertension     states she has not took BP medication for 2 years   Past Surgical History  Procedure Laterality Date  . Thyroid surgery    . Cesarean section    . Tubal ligation     No family history on file. History  Substance Use Topics  . Smoking status: Current Every Day Smoker -- 0.50 packs/day    Types: Cigarettes  . Smokeless tobacco: Not on file  . Alcohol Use: Yes     Comment: Rare   OB History    Gravida Para Term  Preterm AB TAB SAB Ectopic Multiple Living   '10 8 3 5 2 2 '$ 0 0 0 8     Review of Systems  Constitutional: Negative for fever.  HENT: Positive for voice change. Negative for trouble swallowing.   Eyes: Negative for visual disturbance.  Respiratory: Negative for shortness of breath.   Cardiovascular: Negative for chest pain.  Gastrointestinal: Positive for nausea. Negative for vomiting and abdominal pain.  Genitourinary: Negative for dysuria.  Musculoskeletal: Negative for neck pain.  Skin: Positive for rash.  Neurological: Negative for dizziness, weakness and numbness.  Psychiatric/Behavioral: Negative.       Allergies  Penicillins and Vicodin  Home Medications   Prior to Admission medications   Medication Sig Start Date End Date Taking? Authorizing Provider  amitriptyline (ELAVIL) 10 MG tablet Take 30 mg by mouth at bedtime.    Historical Provider, MD  Aspirin-Salicylamide-Caffeine (BC HEADACHE PO) Take 2 packets by mouth daily as needed (headaches).     Historical Provider, MD  buPROPion (WELLBUTRIN SR) 150 MG 12 hr tablet Take 150 mg by mouth daily.  07/09/14   Historical Provider, MD  busPIRone (BUSPAR) 10 MG tablet Take 10 mg by mouth 2 (two) times daily. 06/03/14   Historical Provider, MD  clindamycin (CLEOCIN) 150 MG capsule Take 2 capsules (300 mg total) by mouth 3 (three) times daily. 11/01/14   Charlann Lange, PA-C  famotidine (PEPCID)  40 MG tablet Take 1 tablet (40 mg total) by mouth daily. 12/29/14   Dahlia Bailiff, PA-C  ibuprofen (ADVIL,MOTRIN) 800 MG tablet Take 1 tablet (800 mg total) by mouth 3 (three) times daily. 08/28/14   Fransico Meadow, PA-C  lamoTRIgine (LAMICTAL) 200 MG tablet Take 200 mg by mouth daily. 07/09/14   Historical Provider, MD  loratadine (CLARITIN) 10 MG tablet Take 1 tablet (10 mg total) by mouth daily. 12/20/14   Okey Regal, PA-C  OVER THE COUNTER MEDICATION Take 1 each by mouth daily. Pt states she uses an OTC B-Complex powder daily.    Historical  Provider, MD  oxyCODONE-acetaminophen (PERCOCET/ROXICET) 5-325 MG per tablet Take 2 tablets by mouth every 4 (four) hours as needed for severe pain. 08/28/14   Fransico Meadow, PA-C  PARoxetine (PAXIL) 20 MG tablet Take 20 mg by mouth daily. 07/09/14   Historical Provider, MD  predniSONE (DELTASONE) 20 MG tablet 3 tabs po daily x 3 days, then 2 tabs x 3 days, then 1.5 tabs x 3 days, then 1 tab x 3 days, then 0.5 tabs x 3 days 12/29/14   Dahlia Bailiff, PA-C  QUEtiapine Fumarate (SEROQUEL XR) 150 MG 24 hr tablet Take 75 mg by mouth every morning.    Historical Provider, MD  sulfamethoxazole-trimethoprim (SEPTRA DS) 800-160 MG per tablet Take 1 tablet by mouth every 12 (twelve) hours. Patient not taking: Reported on 08/28/2014 01/27/14   Montine Circle, PA-C  traMADol (ULTRAM) 50 MG tablet Take 1 tablet (50 mg total) by mouth every 6 (six) hours as needed. 11/01/14   Charlann Lange, PA-C  traZODone (DESYREL) 150 MG tablet Take 150 mg by mouth at bedtime.    Historical Provider, MD  verapamil (CALAN-SR) 120 MG CR tablet Take 120 mg by mouth daily.  07/19/14   Historical Provider, MD   Triage Vitals: BP 155/96 mmHg  Pulse 99  Temp(Src) 98.1 F (36.7 C) (Oral)  Resp 16  SpO2 99%  LMP 12/17/2014 Physical Exam  Constitutional: She is oriented to person, place, and time. She appears well-developed and well-nourished. No distress.  HENT:  Head: Normocephalic and atraumatic.  Mouth/Throat: Oropharynx is clear and moist. No oropharyngeal exudate.  Eyes: Right eye exhibits no discharge. Left eye exhibits no discharge. No scleral icterus.  Neck: Normal range of motion and full passive range of motion without pain. Neck supple. No spinous process tenderness and no muscular tenderness present. No rigidity. No edema, no erythema and normal range of motion present. No Brudzinski's sign and no Kernig's sign noted.  Cardiovascular: Normal rate, regular rhythm and normal heart sounds.   No murmur heard. Pulmonary/Chest:  Effort normal and breath sounds normal. No accessory muscle usage. No tachypnea. No respiratory distress. She has no wheezes. She has no rales.  Lungs clear equally bilaterally.  Abdominal: Soft. There is no tenderness.  Musculoskeletal: Normal range of motion. She exhibits no edema or tenderness.  Neurological: She is alert and oriented to person, place, and time. No cranial nerve deficit. Coordination normal.  Skin: Skin is warm and dry. Rash noted. She is not diaphoretic.  Maculo-papular raised non-erythematous lesions to anterior forearms bilaterally, neck circumferentially, upper chest, upper back, and upper abdomen.  Psychiatric: She has a normal mood and affect.  Nursing note and vitals reviewed.   ED Course  Procedures (including critical care time) DIAGNOSTIC STUDIES: Oxygen Saturation is 99% on room air, normal by my interpretation.    COORDINATION OF CARE:    Labs Review Labs  Reviewed - No data to display  Imaging Review No results found.   EKG Interpretation None      MDM   Final diagnoses:  Urticaria    No evidence of SJS or necrotizing fasciitis. Due to pruritic and not painful nature of blisters do not suspect pemphigus vulgaris. Pustules do not resemble scabies as per pt hx.  No blisters, no pustules, no warmth, no draining sinus tracts, no superficial abscesses, no bullous impetigo, no vesicles, no desquamation, no target lesions with dusky purpura or a central bulla. Not tender to touch. Rash is urticarial in appearance, patient does describe some hoarseness of her voice and throat pain, continues to deny dysphasia or shortness of breath. With multiple examinations, there is no angioedema noted, patient remains hemodynamically stable. Oral medications were attempted here in the ER, however patient vomited 2. IV medications were given in place. After symptomatically therapy patient reports  improvement of her symptoms. On reexamination patient still does not  have any signs or symptoms of anaphylaxis. She is hemodynamically stable and in no acute distress. Patient discharged with instruction to follow up with lobe hour allergy clinic. Patient placed on prednisone taper. Return precautions discussed with patient, patient verbalizes understanding and agreement of this plan. I encouraged patient to call or return to the ER with any worsening of symptoms or should she have any questions or concerns.  I personally performed the services described in this documentation, which was scribed in my presence. The recorded information has been reviewed and is accurate.  BP 128/79 mmHg  Pulse 108  Temp(Src) 99.8 F (37.7 C) (Oral)  Resp 18  SpO2 100%  LMP 12/17/2014  Signed,  Dahlia Bailiff, PA-C 2:33 AM  Patient seen and discussed with Dr. Rolland Porter, M.D.  Dahlia Bailiff, PA-C 12/29/14 8833  Rolland Porter, MD 12/29/14 [phone]

## 2014-12-29 DIAGNOSIS — L509 Urticaria, unspecified: Secondary | ICD-10-CM | POA: Diagnosis not present

## 2014-12-29 MED ORDER — METHYLPREDNISOLONE SODIUM SUCC 125 MG IJ SOLR
125.0000 mg | Freq: Once | INTRAMUSCULAR | Status: AC
  Administered 2014-12-29: 125 mg via INTRAVENOUS
  Filled 2014-12-29: qty 2

## 2014-12-29 MED ORDER — PREDNISONE 20 MG PO TABS: ORAL_TABLET | ORAL | Status: DC

## 2014-12-29 MED ORDER — FAMOTIDINE 40 MG PO TABS: 40.0000 mg | ORAL_TABLET | Freq: Every day | ORAL | Status: DC

## 2014-12-29 NOTE — Discharge Instructions (Signed)
Hives Hives are itchy, red, swollen areas of the skin. They can vary in size and location on your body. Hives can come and go for hours or several days (acute hives) or for several weeks (chronic hives). Hives do not spread from person to person (noncontagious). They may get worse with scratching, exercise, and emotional stress. CAUSES   Allergic reaction to food, additives, or drugs.  Infections, including the common cold.  Illness, such as vasculitis, lupus, or thyroid disease.  Exposure to sunlight, heat, or cold.  Exercise.  Stress.  Contact with chemicals. SYMPTOMS   Red or white swollen patches on the skin. The patches may change size, shape, and location quickly and repeatedly.  Itching.  Swelling of the hands, feet, and face. This may occur if hives develop deeper in the skin. DIAGNOSIS  Your caregiver can usually tell what is wrong by performing a physical exam. Skin or blood tests may also be done to determine the cause of your hives. In some cases, the cause cannot be determined. TREATMENT  Mild cases usually get better with medicines such as antihistamines. Severe cases may require an emergency epinephrine injection. If the cause of your hives is known, treatment includes avoiding that trigger.  HOME CARE INSTRUCTIONS   Avoid causes that trigger your hives.  Take antihistamines as directed by your caregiver to reduce the severity of your hives. Non-sedating or low-sedating antihistamines are usually recommended. Do not drive while taking an antihistamine.  Take any other medicines prescribed for itching as directed by your caregiver.  Wear loose-fitting clothing.  Keep all follow-up appointments as directed by your caregiver. SEEK MEDICAL CARE IF:   You have persistent or severe itching that is not relieved with medicine.  You have painful or swollen joints. SEEK IMMEDIATE MEDICAL CARE IF:   You have a fever.  Your tongue or lips are swollen.  You have  trouble breathing or swallowing.  You feel tightness in the throat or chest.  You have abdominal pain. These problems may be the first sign of a life-threatening allergic reaction. Call your local emergency services (911 in U.S.). MAKE SURE YOU:   Understand these instructions.  Will watch your condition.  Will get help right away if you are not doing well or get worse. Document Released: 07/30/2005 Document Revised: 08/04/2013 Document Reviewed: 10/23/2011 [facility] Patient Information 2015 [place], [place]. This information is not intended to replace advice given to you by your health care provider. Make sure you discuss any questions you have with your health care provider.   Emergency Department Resource Guide 1) Find a Doctor and Pay Out of Pocket Although you won't have to find out who is covered by your insurance plan, it is a good idea to ask around and get recommendations. You will then need to call the office and see if the doctor you have chosen will accept you as a new patient and what types of options they offer for patients who are self-pay. Some doctors offer discounts or will set up payment plans for their patients who do not have insurance, but you will need to ask so you aren't surprised when you get to your appointment.  2) Contact Your Local Health Department Not all health departments have doctors that can see patients for sick visits, but many do, so it is worth a call to see if yours does. If you don't know where your local health department is, you can check in your phone book. The CDC also has a  tool to help you locate your state's health department, and many state websites also have listings of all of their local health departments.  3) Find a [facility] If your illness is not likely to be very severe or complicated, you may want to try a walk in clinic. These are popping up all over the country in pharmacies, drugstores, and shopping centers. They're usually  staffed by nurse practitioners or physician assistants that have been trained to treat common illnesses and complaints. They're usually fairly quick and inexpensive. However, if you have serious medical issues or chronic medical problems, these are probably not your best option.  No Primary Care Doctor: - Call Health Connect at  [phone] - they can help you locate a primary care doctor that  accepts your insurance, provides certain services, etc. - Physician Referral Service- [phone]  Chronic Pain Problems: Organization         Address  Phone   Notes  [facility]  [phone] Patients need to be referred by their primary care doctor.   Medication Assistance: Organization         Address  Phone   Notes  [facility]ication [facility] [address] [place], [place] [address] [phone] --Must be a resident of [facility] -- Must have NO insurance coverage whatsoever (no Medicaid/ Medicare, etc.) -- The pt. MUST have a primary care doctor that directs their care regularly and follows them in the community   [facility]ist  [phone]   [employer]  [phone]    Agencies that provide inexpensive medical care: Organization         Address  Phone   Notes  [place]  [phone]   [facility]    [phone]   [facility] [place], [place] [address] [phone]   [place] [address] [place] [phone]   Planned Parenthood    [phone]   [facility]    [phone]   [place] and [place] [address], [place] Phone:  [phone], Fax:  [phone] Hours of Operation:  9 am - 6 pm, M-F.  Also accepts Medicaid/Medicare and self-pay.  [facility] for [place] [address], [address], [place] Phone: [phone], Fax: [phone]. Hours of  Operation:  8:30 am - 5:30 pm, M-F.  Also accepts Medicaid and self-pay.  [facility] [place] [address], [place] Phone: [phone]   [place], [place], [place] [phone], Ext. [phone] Mondays & Thursdays: 7-9 AM.  First 15 patients are seen on a first come, first serve basis.    [place] Providers:  Organization         Address  Phone   Notes  [facility] [address], [place] [phone] Also accepts self-pay patients.  [facility] [address], [place]  [phone]   [place], [address], [place] [phone]   [facility] [address], [place] [phone]   Lucianne Lei [address], [place]   [phone] Only accepts [place] Access [place] patients after they have their name applied to their card.   Self-Pay (no insurance) in [place]:  Organization         Address  Phone   Notes  Sickle Cell Patients, [facility] [place] [phone]   [facility] [place] [phone]   [facility] [place]  [place], [address], [place] [phone]   [facility]/Dr. Osei-Bonsu  [address] [place] or [address], [place] [phone] Phone number for both [place] and [place] locations is the same.  [facility] [address] [place] [phone]   [facility] [address] [place] or [address] [phone]   [facility] [address] [place] [phone], phone; [phone], fax Sees patients 1st and 3rd Saturday of every month.  Must not qualify for public or private insurance (i.e. Medicaid, Medicare,  [place] Health Choice, Veterans' Benefits)  Household income should be no more than 200% of the poverty level The clinic cannot treat you if you are pregnant or think you are pregnant  Sexually transmitted diseases are not treated at the clinic.    Dental Care: Organization         Address  Phone  Notes  [facility] [place] [phone] Accepts children up to age 29 who are enrolled in [place] or [place]; pregnant women with a Medicaid card; and children who have applied for Medicaid or [place] Health Choice, but were declined, whose parents can pay a reduced fee at time of service.  [facility] Department of [facility]  [address], [place] [phone] Accepts children up to age 75 who are enrolled in [place] or [place]; pregnant women with a Medicaid card; and children who have applied for Medicaid or [place] Health Choice, but were declined, whose parents can pay a reduced fee at time of service.  [place] Adult [facility] Access PROGRAM  [place] [phone] Patients are seen by appointment only. Walk-ins are not accepted. [place] will see patients 81 years of age and older. Monday - Tuesday (8am-5pm) Most Wednesdays (8:30-5pm) $30 per visit, cash only  [facility] Adult Dental Access PROGRAM  [address], [facility] [phone] Patients are seen by appointment only. Walk-ins are not accepted. [place] will see patients 61 years of age and older. One Wednesday Evening (Monthly: Volunteer Based).  $30 per visit, cash only  [place]  [phone] for adults; Children under age 69, call Graduate Pediatric Dentistry at [phone]. Children aged 25-14, please call [phone] to request a pediatric application.  Dental services are provided in all areas of dental care including fillings, crowns and bridges,  complete and partial dentures, implants, gum treatment, root canals, and extractions. Preventive care is also provided. Treatment is provided to both adults and children. Patients are selected via a lottery and there is often a waiting list.   [facility] [address], [place]  [phone] [email or web address]   [facility] [address] Eugenio Saenz, [place] [phone], Ext. [phone] Second and Fourth Thursday of each month, opens at 6:30 AM; Clinic ends at 9 AM.  Patients are seen on a first-come first-served basis, and a limited number are seen during each clinic.  [facility]  [address] Hillard Danker [place], [place] [phone]   Eligibility Requirements You must have lived in [place], [place], or [place] counties for at least the last three months.   You cannot be eligible for state or federal sponsored [employer], including [employer], [place], or [employer].   You generally cannot be eligible for healthcare insurance through your employer.    How to apply: Eligibility screenings are held every Tuesday and Wednesday afternoon from 1:00 pm until 4:00 pm. You do not need an appointment for the interview!  [facility] [address] [place], [place]   [place]  [place] Department  [place]  [phone]    Behavioral Health Resources in the Community: Intensive Outpatient Programs Organization         Address  Phone  Notes  [place] [address], [place], [place] [phone]   [facility] [address] [place], [place]   ADS: Alcohol & Drug Svcs [address], [place], [place]   [place] [address]  [place], [place] or [phone]   Substance Abuse Resources Organization          Address  Phone  Notes  Alcohol and Drug Services  [phone]   [place]  [phone]   [place]   Chinita Pester  [phone]   Residential & Outpatient Substance Abuse Program  [phone]   Psychological Services Organization         Address  Phone  Notes  [facility] [place]  [place]  [phone]   [place] [address] [place] or [phone]    Mobile Crisis Teams Organization         Address  Phone  Notes  [facility], Mobile Crisis Care Unit  [phone]   [facility] Psychotherapeutic Services  [address] [place], [place]   [place] [address], [place] [phone]    Self-Help/Support Groups Organization         Address  Phone             Notes  [place]. of [place] - variety of support groups  [place] Call for more information  Narcotics Anonymous (NA), [facility] [address], [employer] [place]  2 meetings at this location   Special educational needs teacher         Address  Phone  Notes  ASAP Residential Treatment [place],    [place]  [phone])[phone]   [facility]  [address], [place] [address], [place], [place]   [place], [place] [phone] Admissions: 8am-3pm M-F  Incentives Substance [place] [address]    [place], [place] [phone]   [facility] [address] Jadene Pierini [place], [place]   [facility] [address],  [place], [place]   [facility] Programs - Intensive Outpatient [address], [place], [place]   [facility] ([place].,  [place], [place] or [phone]   [facility]idential Treatment Services (RTS) [address] [place], [place] Accepts Medicaid  [facility] [place] [address]  [place] [phone])648-7522 Substance Abuse/Addiction  [place] Resources Organization         Address  Phone  Notes  [facility]  [phone]   Domenic Schwab, PhD [address] Arlis Porta [place], [place]   [phone] or [phone]   [place], [place] [phone]   [address], [place], [place] [phone] Insurance/Medicaid/sponsorship through [facility] and Families [address], [address] [place]                                    [place], [place] [phone] [place] [address] [place], [place] [phone]    Dr. Adele Schilder  [phone]   [facility] Dept. 1) [address], [place] 2) [place] 3)  [place] [address], [place] [phone]  [phone]   [facility] [phone] or [phone] (After Hours)

## 2014-12-29 NOTE — ED Provider Notes (Signed)
Patient was seen in the ED on April 9 with urticarial type rash. She was discharged on low-dose prednisone and Claritin with some improvement. However she reports about 2 PM this afternoon she started getting more itching and rash. She is noted to have rash on her neck and lower face, along her bra line and on her legs. She denies any trouble breathing but states it's hard to swallow and she indicates underneath her tonsillar areas and coming around to the front of her neck. She denies having swelling of her lips or tongue. She does not appear to be short of breath. She denies anything new in her home environment. She denies any changes in her medications.  Medical screening examination/treatment/procedure(s) were conducted as a shared visit with non-physician practitioner(s) and myself.  I personally evaluated the patient during the encounter.   EKG Interpretation None       Rolland Porter, MD, Barb[place] Or, MD 12/29/14 [phone]

## 2015-04-26 ENCOUNTER — Other Ambulatory Visit (HOSPITAL_COMMUNITY): Payer: Self-pay | Admitting: Nurse Practitioner

## 2015-04-26 DIAGNOSIS — Z3682 Encounter for antenatal screening for nuchal translucency: Secondary | ICD-10-CM

## 2015-04-26 DIAGNOSIS — Z3A13 13 weeks gestation of pregnancy: Secondary | ICD-10-CM

## 2015-04-26 DIAGNOSIS — O09521 Supervision of elderly multigravida, first trimester: Secondary | ICD-10-CM

## 2015-05-12 ENCOUNTER — Ambulatory Visit (HOSPITAL_COMMUNITY)
Admission: RE | Admit: 2015-05-12 | Discharge: 2015-05-12 | Disposition: A | Discharge status: Home / Self Care | Payer: Medicare Other | Source: Ambulatory Visit | Attending: Family Medicine | Admitting: Family Medicine

## 2015-05-12 ENCOUNTER — Other Ambulatory Visit: Payer: Self-pay | Admitting: Family Medicine

## 2015-05-12 ENCOUNTER — Encounter: Payer: Self-pay | Admitting: Family Medicine

## 2015-05-12 ENCOUNTER — Ambulatory Visit (INDEPENDENT_AMBULATORY_CARE_PROVIDER_SITE_OTHER): Payer: Medicare Other | Admitting: Family Medicine

## 2015-05-12 DIAGNOSIS — O0992 Supervision of high risk pregnancy, unspecified, second trimester: Secondary | ICD-10-CM

## 2015-05-12 DIAGNOSIS — Z3A08 8 weeks gestation of pregnancy: Secondary | ICD-10-CM | POA: Insufficient documentation

## 2015-05-12 DIAGNOSIS — N831 Corpus luteum cyst: Secondary | ICD-10-CM | POA: Diagnosis not present

## 2015-05-12 DIAGNOSIS — O208 Other hemorrhage in early pregnancy: Secondary | ICD-10-CM | POA: Insufficient documentation

## 2015-05-12 DIAGNOSIS — O021 Missed abortion: Secondary | ICD-10-CM | POA: Diagnosis present

## 2015-05-12 DIAGNOSIS — O3481 Maternal care for other abnormalities of pelvic organs, first trimester: Secondary | ICD-10-CM | POA: Insufficient documentation

## 2015-05-12 DIAGNOSIS — O3680X0 Pregnancy with inconclusive fetal viability, not applicable or unspecified: Secondary | ICD-10-CM

## 2015-05-12 DIAGNOSIS — O10919 Unspecified pre-existing hypertension complicating pregnancy, unspecified trimester: Secondary | ICD-10-CM

## 2015-05-12 LAB — POCT URINALYSIS DIP (DEVICE)
GLUCOSE, UA: NEGATIVE mg/dL
LEUKOCYTES UA: NEGATIVE
NITRITE: NEGATIVE
Specific Gravity, Urine: >=1.03 (ref 1.005–1.030)
UROBILINOGEN UA: 0.2 mg/dL (ref 0.0–1.0)
pH: 6 (ref 5.0–8.0)

## 2015-05-12 MED ORDER — MISOPROSTOL 200 MCG PO TABS: 600.0000 ug | ORAL_TABLET | Freq: Once | ORAL | Status: DC

## 2015-05-12 NOTE — Progress Notes (Signed)
Initial prenatal education packet given Breastfeeding tip of the week reviewed Flu vaccine declined FHT unable to determine

## 2015-05-12 NOTE — Progress Notes (Signed)
Subjective:  Crystal Mora is a 39 y.o. [id number] at 11w5dby LMP.  She presented for her initial OB appt.  She denies vaginal bleeding, vaginal discharge.  Unable to get FHT in clinic.  Sent patient for [place]  The following portions of the patient's history were reviewed and updated as appropriate: allergies, current medications, past family history, past medical history, past social history, past surgical history and problem list.   Objective:   Filed Vitals:   05/12/15 0827  BP: 124/63  Pulse: 85  Temp: 98.7 F (37.1 C)  Weight: 190 lb 9.6 oz (86.456 kg)    Fetal Status:           General:  Alert, oriented and cooperative. Patient is in no acute distress.  Skin: Skin is warm and dry. No rash noted.   Cardiovascular: Normal heart rate noted  Respiratory: Normal respiratory effort, no problems with respiration noted  Abdomen: Soft, gravid, appropriate for gestational age. Pain/Pressure: Present     Pelvic: Vag. Bleeding: None     Cervical exam deferred        Extremities: Normal range of motion.  Edema: None  Mental Status: Normal mood and affect. Normal behavior. Normal judgment and thought content.   Urinalysis: Urine Protein: 3+ Urine Glucose: Negative  Assessment and Plan:  Pregnancy: [id number] 163w5d1. Missed abortion No FHT on [place] failed pregnancy.  Discussed options.  Will give Cytotec '600mg'$  orally.  Discussed symptoms and what to look for.  F/u 1 week. - [place] Transvaginal; Future Return in about 1 week (around 05/19/2015) for Missed AB.   JaTruett MainlandDO

## 2015-05-16 ENCOUNTER — Inpatient Hospital Stay (HOSPITAL_COMMUNITY): Admission: RE | Admit: 2015-05-16 | Payer: Medicare Other | Source: Ambulatory Visit

## 2015-05-16 ENCOUNTER — Ambulatory Visit (HOSPITAL_COMMUNITY): Payer: Medicare Other

## 2015-05-23 ENCOUNTER — Encounter: Payer: Self-pay | Admitting: Obstetrics & Gynecology

## 2015-05-23 ENCOUNTER — Ambulatory Visit (INDEPENDENT_AMBULATORY_CARE_PROVIDER_SITE_OTHER): Payer: Medicare Other | Admitting: Obstetrics & Gynecology

## 2015-05-23 VITALS — BP 128/72 | HR 94 | Temp 98.5°F | Ht 66.0 in | Wt 187.0 lb

## 2015-05-23 DIAGNOSIS — O021 Missed abortion: Secondary | ICD-10-CM

## 2015-05-23 NOTE — Progress Notes (Signed)
CLINIC ENCOUNTER NOTE  History:  39 y.o. [id number] here today for follow up after recent diagnosis and medical management of early missed abortion. She reports passing products of conception, has minimal lochia currently.  Feels appropriately sad, does not want to talk to anyone as she already has a psychiatrist.  Wants Clomid so she can conceive quickly; it took her two years to conceive this pregnancy. She denies any abnormal vaginal discharge, bleeding, pelvic pain or other concerns.   Past Medical History  Diagnosis Date  . Anxiety   . Depression   . Bipolar 1 disorder ([place])   . Insomnia   . Thyroid disease   . Hypertension     states she has not took BP medication for 2 years    Past Surgical History  Procedure Laterality Date  . Thyroid surgery    . Cesarean section    . Tubal ligation    . Tubal reversal Bilateral    The following portions of the patient's history were reviewed and updated as appropriate: allergies, current medications, past family history, past medical history, past social history, past surgical history and problem list.    Review of Systems:  Pertinent items noted in HPI and remainder of comprehensive ROS otherwise negative.  Objective:  Physical Exam BP 128/72 mmHg  Pulse 94  Temp(Src) 98.5 F (36.9 C) (Oral)  Ht '5\' 6"'$  (1.676 m)  Wt 187 lb (84.823 kg)  BMI 30.20 kg/m2  LMP 02/12/2015  Breastfeeding? Unknown CONSTITUTIONAL: Well-developed, well-nourished female in no acute distress.  HENT:  Normocephalic, atraumatic. External right and left ear normal. Oropharynx is clear and moist EYES: Conjunctivae and EOM are normal. Pupils are equal, round, and reactive to light. No scleral icterus.  NECK: Normal range of motion, supple, no masses SKIN: Skin is warm and dry. No rash noted. Not diaphoretic. No erythema. No pallor. [place]: Alert and oriented to person, place, and time. Normal reflexes, muscle tone coordination. No cranial nerve deficit  noted. PSYCHIATRIC: Normal mood and affect. Normal behavior. Normal judgment and thought content. CARDIOVASCULAR: Normal heart rate noted RESPIRATORY: Effort and breath sounds normal, no problems with respiration noted ABDOMEN: Soft, no distention noted.   PELVIC: Deferred MUSCULOSKELETAL: Normal range of motion. No edema noted.  Labs and Imaging US Ob Comp Less 14 Wks  05/12/2015   CLINICAL DATA:  Pregnant, absent fetal heart tones  EXAM: OBSTETRIC <14 WK [place] AND TRANSVAGINAL OB US  TECHNIQUE: Both transabdominal and transvaginal ultrasound examinations were performed for complete evaluation of the gestation as well as the maternal uterus, adnexal regions, and pelvic cul-de-sac. Transvaginal technique was performed to assess early pregnancy.  COMPARISON:  None.  FINDINGS: Intrauterine gestational sac: Visualize, irregular in shape with angular margins.  Yolk sac:  Present  Embryo:  Present  Cardiac Activity: Absent  MSD: 36.9  mm   9 w   0 d  CRL:  15.8  mm   8 w   0 d  Maternal uterus/adnexae: Moderate chorionic hemorrhage (chorionic bump sign).  Right ovary is not discretely visualized.  Left ovary is notable for a corpus luteal cyst.  No free fluid.  IMPRESSION: Single intrauterine gestation, measuring 8 weeks 0 days by crown-rump length, without cardiac activity.  Findings meet definitive criteria for failed pregnancy. This follows SRU consensus guidelines: Diagnostic Criteria for Nonviable Pregnancy Early in the First Trimester. Alison Stalling J Med [phone].  These results will be called to the ordering clinician or representative by the Radiologist Assistant, and  communication documented in the PACS or zVision Dashboard.   Electronically Signed   By: Julian Hy M.D.   On: 05/12/2015 10:47   US Ob Transvaginal  05/12/2015   CLINICAL DATA:  Pregnant, absent fetal heart tones  EXAM: OBSTETRIC <14 WK [place] AND TRANSVAGINAL OB US  TECHNIQUE: Both transabdominal and transvaginal ultrasound  examinations were performed for complete evaluation of the gestation as well as the maternal uterus, adnexal regions, and pelvic cul-de-sac. Transvaginal technique was performed to assess early pregnancy.  COMPARISON:  None.  FINDINGS: Intrauterine gestational sac: Visualize, irregular in shape with angular margins.  Yolk sac:  Present  Embryo:  Present  Cardiac Activity: Absent  MSD: 36.9  mm   9 w   0 d  CRL:  15.8  mm   8 w   0 d  Maternal uterus/adnexae: Moderate chorionic hemorrhage (chorionic bump sign).  Right ovary is not discretely visualized.  Left ovary is notable for a corpus luteal cyst.  No free fluid.  IMPRESSION: Single intrauterine gestation, measuring 8 weeks 0 days by crown-rump length, without cardiac activity.  Findings meet definitive criteria for failed pregnancy. This follows SRU consensus guidelines: Diagnostic Criteria for Nonviable Pregnancy Early in the First Trimester. Alison Stalling J Med [phone].  These results will be called to the ordering clinician or representative by the Radiologist Assistant, and communication documented in the PACS or zVision Dashboard.   Electronically Signed   By: Julian Hy M.D.   On: 05/12/2015 10:47    Assessment & Plan:  Missed abortion Resolved.  Patient to continue prenatal vitamins as she wants to conceive again.  Told to call once she is pregnant. Counseled about recurrent SAB risk. Patient was told there was no current indication for Clomid; she was told she can follow up with any REI office for a second opinion and evaluation. Routine preventative health maintenance measures emphasized. Please refer to After Visit Summary for other counseling recommendations.    Total face-to-face time with patient: 15 minutes. Over 50% of encounter was spent on counseling and coordination of care.   Verita Schneiders, MD, [place] Attending Obstetrician & Gynecologist, [place] for  [facility]

## 2015-05-23 NOTE — Patient Instructions (Signed)
Return to clinic for any scheduled appointments or for any gynecologic concerns as needed.   

## 2015-06-06 ENCOUNTER — Encounter: Payer: Self-pay | Admitting: *Deleted

## 2015-06-09 ENCOUNTER — Encounter (HOSPITAL_COMMUNITY): Payer: Self-pay | Admitting: *Deleted

## 2015-06-09 ENCOUNTER — Emergency Department (HOSPITAL_COMMUNITY)
Admission: EM | Admit: 2015-06-09 | Discharge: 2015-06-09 | Disposition: A | Discharge status: Home / Self Care | Payer: Medicare Other | Attending: Emergency Medicine | Admitting: Emergency Medicine

## 2015-06-09 DIAGNOSIS — Z720 Tobacco use: Secondary | ICD-10-CM | POA: Insufficient documentation

## 2015-06-09 DIAGNOSIS — J069 Acute upper respiratory infection, unspecified: Secondary | ICD-10-CM | POA: Diagnosis not present

## 2015-06-09 DIAGNOSIS — Z8669 Personal history of other diseases of the nervous system and sense organs: Secondary | ICD-10-CM | POA: Diagnosis not present

## 2015-06-09 DIAGNOSIS — F319 Bipolar disorder, unspecified: Secondary | ICD-10-CM | POA: Diagnosis not present

## 2015-06-09 DIAGNOSIS — Z79899 Other long term (current) drug therapy: Secondary | ICD-10-CM | POA: Insufficient documentation

## 2015-06-09 DIAGNOSIS — Z8639 Personal history of other endocrine, nutritional and metabolic disease: Secondary | ICD-10-CM | POA: Diagnosis not present

## 2015-06-09 DIAGNOSIS — Z880 Allergy status to penicillin: Secondary | ICD-10-CM | POA: Insufficient documentation

## 2015-06-09 DIAGNOSIS — K047 Periapical abscess without sinus: Secondary | ICD-10-CM | POA: Diagnosis not present

## 2015-06-09 DIAGNOSIS — I10 Essential (primary) hypertension: Secondary | ICD-10-CM | POA: Insufficient documentation

## 2015-06-09 DIAGNOSIS — J029 Acute pharyngitis, unspecified: Secondary | ICD-10-CM | POA: Diagnosis present

## 2015-06-09 MED ORDER — CLINDAMYCIN HCL 150 MG PO CAPS: 300.0000 mg | ORAL_CAPSULE | Freq: Three times a day (TID) | ORAL | Status: DC

## 2015-06-09 NOTE — ED Notes (Signed)
Peds agreed to see pt

## 2015-06-09 NOTE — ED Notes (Signed)
Pt in c/o sore throat since yesterday, no distress noted, denies fever

## 2015-06-09 NOTE — ED Notes (Signed)
Patient is alert and orientedx4.  Patient was explained discharge instructions and they understood them with no questions.   

## 2015-06-09 NOTE — ED Provider Notes (Signed)
CSN: [id number]     Arrival date & time 06/09/15  1651 History  By signing my name below, I, Meriel Pica, attest that this documentation has been prepared under the direction and in the presence of Debroah Baller, NP.  Electronically Signed: Meriel Pica, ED Scribe. 06/09/2015. 5:41 PM.  Chief Complaint  Patient presents with  . Sore Throat   Patient is a 39 y.o. female presenting with pharyngitis. The history is provided by the patient. No language interpreter was used.  Sore Throat This is a new problem. The current episode started yesterday. The problem occurs constantly. The problem has not changed since onset.Pertinent negatives include no abdominal pain and no shortness of breath. The symptoms are aggravated by swallowing. Nothing relieves the symptoms. Treatments tried: Dayquil; peppermint and lemon tea. The treatment provided no relief.   HPI Comments: Crystal Mora is a 39 y.o. female who presents to the Emergency Department complaining of a sudden onset, constant, 5/10 sore throat that she describes as a tingling/tight feeling onset 1 day ago. She attributes her pain to an abscess to the roof of her mouth that she inserted a needle in a drained herself yesterday. Pt notes a history of oral abscesses. She associates swelling to her anterior neck, pain with swallowing but no difficulty swallowing, a mild cough, and chills.  Pt has been drinking apple cider vinegar, lemon and peppermint tea, and taking dayquil without relief. She is positive for a sick contact with daughter who is ill with a URI. Denies any urinary symptoms, fevers, nausea, vomiting, or diarrhea.   Past Medical History  Diagnosis Date  . Anxiety   . Depression   . Bipolar 1 disorder ([place])   . Insomnia   . Thyroid disease   . Hypertension     states she has not took BP medication for 2 years   Past Surgical History  Procedure Laterality Date  . Thyroid surgery    . Cesarean section    . Tubal ligation    .  Tubal reversal Bilateral    Family History  Problem Relation Age of Onset  . Depression Mother   . Mental illness Mother   . Asthma Mother   . COPD Mother   . Diabetes Mother   . Mental illness Brother   . Mental illness Daughter   . Diabetes Maternal Aunt   . Mental illness Maternal Aunt   . Depression Maternal Aunt   . Diabetes Maternal Grandmother    Social History  Substance Use Topics  . Smoking status: Current Every Day Smoker -- 0.50 packs/day    Types: Cigarettes  . Smokeless tobacco: Never Used  . Alcohol Use: No     Comment: Rare   OB History    Gravida Para Term Preterm AB TAB SAB Ectopic Multiple Living   '11 8 3 5 2 2 '$ 0 0 0 8     Review of Systems  Constitutional: Negative for fever.  HENT: Positive for dental problem and sore throat. Negative for trouble swallowing.        Abscess to mouth  Respiratory: Positive for cough. Negative for shortness of breath.   Gastrointestinal: Negative for nausea, vomiting, abdominal pain and diarrhea.  Genitourinary: Negative for dysuria, urgency and frequency.  All other systems reviewed and are negative.  Allergies  Penicillins and Vicodin  Home Medications   Prior to Admission medications   Medication Sig Start Date End Date Taking? Authorizing Provider  cholecalciferol (VITAMIN D) 1000 UNITS tablet  Take 1,000 Units by mouth daily.    Historical Provider, MD  clindamycin (CLEOCIN) 150 MG capsule Take 2 capsules (300 mg total) by mouth 3 (three) times daily. 06/09/15   Addilee Neu Bunnie Pion, NP  loratadine (CLARITIN) 10 MG tablet Take 1 tablet (10 mg total) by mouth daily. 12/20/14   Okey Regal, PA-C  nicotine (NICOTROL) 10 MG inhaler Inhale 1 continuous puffing into the lungs as needed for smoking cessation.    Historical Provider, MD  Prenatal Vit-Fe Fumarate-FA (PRENATAL MULTIVITAMIN) TABS tablet Take 1 tablet by mouth daily at 12 noon.    Historical Provider, MD  ranitidine (ZANTAC) 150 MG capsule Take 150 mg by mouth 3  (three) times daily.    Historical Provider, MD  verapamil (CALAN-SR) 120 MG CR tablet Take 120 mg by mouth daily.  07/19/14   Historical Provider, MD   BP 133/65 mmHg  Pulse 82  Temp(Src) 98.7 F (37.1 C) (Oral)  Resp 20  Wt 188 lb (85.276 kg)  SpO2 100%  LMP 02/12/2015 Physical Exam  Constitutional: She is oriented to person, place, and time. She appears well-developed and well-nourished. No distress.  HENT:  Right Ear: Tympanic membrane and external ear normal.  Left Ear: Tympanic membrane and external ear normal.  Nose: Nose normal.  Mouth/Throat: Uvula is midline and oropharynx is clear and moist. No oropharyngeal exudate.  Small abscess above the right central incisor. No oropharynx swelling. Mild cervical LAD, no anterior swelling of the neck.   Eyes: Conjunctivae and EOM are normal. Pupils are equal, round, and reactive to light.  Neck: Neck supple.  Cardiovascular: Normal rate and regular rhythm.   Pulmonary/Chest: Effort normal and breath sounds normal.  Abdominal: Soft. There is no tenderness.  Musculoskeletal: Normal range of motion.  Lymphadenopathy:    She has cervical adenopathy.  Neurological: She is alert and oriented to person, place, and time. No cranial nerve deficit.  Skin: Skin is warm and dry.  Psychiatric: She has a normal mood and affect. Her behavior is normal.  Nursing note and vitals reviewed.   ED Course  Procedures  DIAGNOSTIC STUDIES: Oxygen Saturation is 100% on RA, normal by my interpretation.    COORDINATION OF CARE: 5:36 PM Discussed treatment plan with pt at bedside and pt agreed to plan. Will prescribe an oral antibiotic course.   MDM  39 y.o. female with congestion and abscessed tooth stable for d/c without fever and does not appear toxic. Will treat with antibiotics and she will take ibuprofen as needed for pain. She will follow up with her dentist as soon as possible. She will continue to take OTC medication for her URI symptoms.    Final diagnoses:  Dental abscess  URI (upper respiratory infection)   I personally performed the services described in this documentation, which was scribed in my presence. The recorded information has been reviewed and is accurate.    [place], NP 06/10/15 [place], MD 06/14/15 [phone]

## 2015-07-25 DIAGNOSIS — H5203 Hypermetropia, bilateral: Secondary | ICD-10-CM | POA: Insufficient documentation

## 2015-07-25 DIAGNOSIS — H0288A Meibomian gland dysfunction right eye, upper and lower eyelids: Secondary | ICD-10-CM | POA: Insufficient documentation

## 2015-07-25 DIAGNOSIS — L508 Other urticaria: Secondary | ICD-10-CM | POA: Insufficient documentation

## 2015-07-25 DIAGNOSIS — G4440 Drug-induced headache, not elsewhere classified, not intractable: Secondary | ICD-10-CM | POA: Insufficient documentation

## 2015-07-25 DIAGNOSIS — L732 Hidradenitis suppurativa: Secondary | ICD-10-CM | POA: Insufficient documentation

## 2015-07-25 DIAGNOSIS — F99 Mental disorder, not otherwise specified: Secondary | ICD-10-CM | POA: Insufficient documentation

## 2015-07-25 DIAGNOSIS — L501 Idiopathic urticaria: Secondary | ICD-10-CM | POA: Insufficient documentation

## 2015-09-18 ENCOUNTER — Inpatient Hospital Stay (HOSPITAL_COMMUNITY): Payer: Medicare Other

## 2015-09-18 ENCOUNTER — Inpatient Hospital Stay (HOSPITAL_COMMUNITY)
Admission: AD | Admit: 2015-09-18 | Discharge: 2015-09-19 | Disposition: A | Discharge status: Home / Self Care | Payer: Medicare Other | Source: Ambulatory Visit | Attending: Obstetrics & Gynecology | Admitting: Obstetrics & Gynecology

## 2015-09-18 ENCOUNTER — Encounter (HOSPITAL_COMMUNITY): Payer: Self-pay

## 2015-09-18 DIAGNOSIS — O26891 Other specified pregnancy related conditions, first trimester: Secondary | ICD-10-CM | POA: Diagnosis not present

## 2015-09-18 DIAGNOSIS — Z3A01 Less than 8 weeks gestation of pregnancy: Secondary | ICD-10-CM | POA: Insufficient documentation

## 2015-09-18 DIAGNOSIS — O3680X0 Pregnancy with inconclusive fetal viability, not applicable or unspecified: Secondary | ICD-10-CM

## 2015-09-18 DIAGNOSIS — O99331 Smoking (tobacco) complicating pregnancy, first trimester: Secondary | ICD-10-CM | POA: Insufficient documentation

## 2015-09-18 DIAGNOSIS — R102 Pelvic and perineal pain: Secondary | ICD-10-CM | POA: Insufficient documentation

## 2015-09-18 LAB — URINALYSIS, ROUTINE W REFLEX MICROSCOPIC
Bilirubin Urine: NEGATIVE
Glucose, UA: NEGATIVE mg/dL
Hgb urine dipstick: NEGATIVE
Ketones, ur: NEGATIVE mg/dL
LEUKOCYTES UA: NEGATIVE
NITRITE: NEGATIVE
Protein, ur: 100 mg/dL — AB
pH: 6.5 (ref 5.0–8.0)

## 2015-09-18 LAB — CBC
HEMATOCRIT: 32.4 % — AB (ref 36.0–46.0)
HEMOGLOBIN: 10.1 g/dL — AB (ref 12.0–15.0)
MCH: 21.9 pg — ABNORMAL LOW (ref 26.0–34.0)
MCHC: 31.2 g/dL (ref 30.0–36.0)
MCV: 70.3 fL — AB (ref 78.0–100.0)
Platelets: 268 10*3/uL (ref 150–400)
RBC: 4.61 MIL/uL (ref 3.87–5.11)
RDW: 17.6 % — ABNORMAL HIGH (ref 11.5–15.5)
WBC: 8.5 10*3/uL (ref 4.0–10.5)

## 2015-09-18 LAB — URINE MICROSCOPIC-ADD ON: WBC, UA: NONE SEEN WBC/hpf (ref 0–5)

## 2015-09-18 LAB — WET PREP, GENITAL
Clue Cells Wet Prep HPF POC: NONE SEEN
SPERM: NONE SEEN
Trich, Wet Prep: NONE SEEN
YEAST WET PREP: NONE SEEN

## 2015-09-18 LAB — POCT PREGNANCY, URINE: Preg Test, Ur: POSITIVE — AB

## 2015-09-18 LAB — HCG, QUANTITATIVE, PREGNANCY: hCG, Beta Chain, Quant, S: 4599 m[IU]/mL — ABNORMAL HIGH (ref <5)

## 2015-09-18 NOTE — Discharge Instructions (Signed)
Ectopic Pregnancy An ectopic pregnancy is when the fertilized egg attaches (implants) outside the uterus. Most ectopic pregnancies occur in the fallopian tube. Rarely do ectopic pregnancies occur on the ovary, intestine, pelvis, or cervix. In an ectopic pregnancy, the fertilized egg does not have the ability to develop into a normal, healthy baby.  A ruptured ectopic pregnancy is one in which the fallopian tube gets torn or bursts and results in internal bleeding. Often there is intense abdominal pain, and sometimes, vaginal bleeding. Having an ectopic pregnancy can be life threatening. If left untreated, this dangerous condition can lead to a blood transfusion, abdominal surgery, or even death. CAUSES  Damage to the fallopian tubes is the suspected cause in most ectopic pregnancies.  RISK FACTORS Depending on your circumstances, the risk of having an ectopic pregnancy will vary. The level of risk can be divided into three categories. High Risk  You have gone through infertility treatment.  You have had a previous ectopic pregnancy.  You have had previous tubal surgery.  You have had previous surgery to have the fallopian tubes tied (tubal ligation).  You have tubal problems or diseases.  You have been exposed to DES. DES is a medicine that was used until 1971 and had effects on babies whose mothers took the medicine.  You become pregnant while using an intrauterine device (IUD) for birth control. Moderate Risk  You have a history of infertility.  You have a history of a sexually transmitted infection (STI).  You have a history of pelvic inflammatory disease (PID).  You have scarring from endometriosis.  You have multiple sexual partners.  You smoke. Low Risk  You have had previous pelvic surgery.  You use vaginal douching.  You became sexually active before 39 years of age. SIGNS AND SYMPTOMS  An ectopic pregnancy should be suspected in anyone who has missed a period and  has abdominal pain or bleeding.  You may experience normal pregnancy symptoms, such as:  Nausea.  Tiredness.  Breast tenderness.  Other symptoms may include:  Pain with intercourse.  Irregular vaginal bleeding or spotting.  Cramping or pain on one side or in the lower abdomen.  Fast heartbeat.  Passing out while having a bowel movement.  Symptoms of a ruptured ectopic pregnancy and internal bleeding may include:  Sudden, severe pain in the abdomen and pelvis.  Dizziness or fainting.  Pain in the shoulder area. DIAGNOSIS  Tests that may be performed include:  A pregnancy test.  An ultrasound test.  Testing the specific level of pregnancy hormone in the bloodstream.  Taking a sample of uterus tissue (dilation and curettage, D&C).  Surgery to perform a visual exam of the inside of the abdomen using a thin, lighted tube with a tiny camera on the end (laparoscope). TREATMENT  An injection of a medicine called methotrexate may be given. This medicine causes the pregnancy tissue to be absorbed. It is given if:  The diagnosis is made early.  The fallopian tube has not ruptured.  You are considered to be a good candidate for the medicine. Usually, pregnancy hormone blood levels are checked after methotrexate treatment. This is to be sure the medicine is effective. It may take 4-6 weeks for the pregnancy to be absorbed (though most pregnancies will be absorbed by 3 weeks). Surgical treatment may be needed. A laparoscope may be used to remove the pregnancy tissue. If severe internal bleeding occurs, a cut (incision) may be made in the lower abdomen (laparotomy), and the ectopic  pregnancy is removed. This stops the bleeding. Part of the fallopian tube, or the whole tube, may be removed as well (salpingectomy). After surgery, pregnancy hormone tests may be done to be sure there is no pregnancy tissue left. You may receive a Rho (D) immune globulin shot if you are Rh negative and  the father is Rh positive, or if you do not know the Rh type of the father. This is to prevent problems with any future pregnancy. SEEK IMMEDIATE MEDICAL CARE IF:  You have any symptoms of an ectopic pregnancy. This is a medical emergency. MAKE SURE YOU:  Understand these instructions.  Will watch your condition.  Will get help right away if you are not doing well or get worse.   This information is not intended to replace advice given to you by your health care provider. Make sure you discuss any questions you have with your health care provider.   Document Released: 09/06/2004 Document Revised: 08/20/2014 Document Reviewed: 02/26/2013 Elsevier Interactive Patient Education [employer].

## 2015-09-18 NOTE — MAU Note (Signed)
Pt presents complaining of lower abdominal pain that started today. 3 +HPT. Also having nausea.

## 2015-09-18 NOTE — MAU Provider Note (Signed)
History     CSN: [id number]  Arrival date and time: 09/18/15 2145   First Provider Initiated Contact with Patient 09/18/15 2216      Chief Complaint  Patient presents with  . Abdominal Pain  . Nausea   HPI  Pelvic Pain The patient's primary symptoms include pelvic pain. This is a new problem. The current episode started today. The problem occurs constantly. The problem has been unchanged. Pain severity now: 7/10. The problem affects both sides. She is pregnant. Associated symptoms include abdominal pain. Pertinent negatives include no chills, constipation, diarrhea, dysuria, fever, frequency, nausea, urgency or vomiting. There has been no bleeding. Nothing aggravates the symptoms. Treatments tried: BC powder  The treatment provided no relief. She is sexually active. It is unknown whether or not her partner has an STD. She uses nothing for contraception. Menstrual history: LMP 08/05/15     Past Medical History  Diagnosis Date  . Anxiety   . Depression   . Bipolar 1 disorder ([place])   . Insomnia   . Thyroid disease   . Hypertension     states she has not took BP medication for 2 years    Past Surgical History  Procedure Laterality Date  . Thyroid surgery    . Cesarean section    . Tubal ligation    . Tubal reversal Bilateral     Family History  Problem Relation Age of Onset  . Depression Mother   . Mental illness Mother   . Asthma Mother   . COPD Mother   . Diabetes Mother   . Mental illness Brother   . Mental illness Daughter   . Diabetes Maternal Aunt   . Mental illness Maternal Aunt   . Depression Maternal Aunt   . Diabetes Maternal Grandmother     Social History  Substance Use Topics  . Smoking status: Current Every Day Smoker -- 0.50 packs/day    Types: Cigarettes  . Smokeless tobacco: Never Used  . Alcohol Use: No     Comment: Rare    Allergies:  Allergies  Allergen Reactions  . Penicillins Hives, Itching and Swelling    Has patient had a PCN  reaction causing immediate rash, facial/tongue/throat swelling, SOB or lightheadedness with hypotension: No Has patient had a PCN reaction causing severe rash involving mucus membranes or skin necrosis: No Has patient had a PCN reaction that required hospitalization No Has patient had a PCN reaction occurring within the last 10 years: Yes If all of the above answers are "NO", then may proceed with Cephalosporin use.   . Vicodin [Hydrocodone-Acetaminophen] Hives and Itching    Pt states tolerated with benadryl    Prescriptions prior to admission  Medication Sig Dispense Refill Last Dose  . Aspirin-Salicylamide-Caffeine (BC HEADACHE POWDER PO) Take 1 Package by mouth daily as needed (headache).   09/18/2015 at Unknown time  . BIOTIN PO Take 1 tablet by mouth daily.   09/18/2015 at Unknown time  . clindamycin (CLEOCIN) 150 MG capsule Take 2 capsules (300 mg total) by mouth 3 (three) times daily. (Patient not taking: Reported on 09/18/2015) 42 capsule 0 Completed Course at Unknown time  . loratadine (CLARITIN) 10 MG tablet Take 1 tablet (10 mg total) by mouth daily. (Patient not taking: Reported on 09/18/2015) 30 tablet 0 Not Taking at Unknown time    ROS Physical Exam   Blood pressure 148/82, pulse 105, temperature 98.6 F (37 C), temperature source Oral, resp. rate 18, height '5\' 6"'$  (1.676 m), weight  82.555 kg (182 lb), last menstrual period 08/05/2015, unknown if currently breastfeeding.  Physical Exam  Nursing note and vitals reviewed. Constitutional: She is oriented to person, place, and time. She appears well-developed and well-nourished. No distress.  HENT:  Head: Normocephalic.  Cardiovascular: Normal rate.   Respiratory: Effort normal and breath sounds normal.  GI: Bowel sounds are normal. There is no tenderness. There is no rebound.  Genitourinary:  External: no lesion Vagina: small amount of white discharge Cervix: pink, smooth, no CMT Uterus: NSSC Adnexa: NT   Neurological: She  is alert and oriented to person, place, and time.  Skin: Skin is warm and dry.  Psychiatric: She has a normal mood and affect.   Results for orders placed or performed during the hospital encounter of 09/18/15 (from the past 24 hour(s))  Urinalysis, Routine w reflex microscopic (not at [facility])     Status: Abnormal   Collection Time: 09/18/15  9:50 PM  Result Value Ref Range   Color, Urine YELLOW YELLOW   APPearance CLEAR CLEAR   Specific Gravity, Urine >1.030 (H) 1.005 - 1.030   pH 6.5 5.0 - 8.0   Glucose, UA NEGATIVE NEGATIVE mg/dL   Hgb urine dipstick NEGATIVE NEGATIVE   Bilirubin Urine NEGATIVE NEGATIVE   Ketones, ur NEGATIVE NEGATIVE mg/dL   Protein, ur 100 (A) NEGATIVE mg/dL   Nitrite NEGATIVE NEGATIVE   Leukocytes, UA NEGATIVE NEGATIVE  Urine microscopic-add on     Status: Abnormal   Collection Time: 09/18/15  9:50 PM  Result Value Ref Range   Squamous Epithelial / LPF 0-5 (A) NONE SEEN   WBC, UA NONE SEEN 0 - 5 WBC/hpf   RBC / HPF 0-5 0 - 5 RBC/hpf   Bacteria, UA FEW (A) NONE SEEN  Pregnancy, urine POC     Status: Abnormal   Collection Time: 09/18/15  9:56 PM  Result Value Ref Range   Preg Test, Ur POSITIVE (A) NEGATIVE  Wet prep, genital     Status: Abnormal   Collection Time: 09/18/15 10:44 PM  Result Value Ref Range   Yeast Wet Prep HPF POC NONE SEEN NONE SEEN   Trich, Wet Prep NONE SEEN NONE SEEN   Clue Cells Wet Prep HPF POC NONE SEEN NONE SEEN   WBC, Wet Prep HPF POC MODERATE (A) NONE SEEN   Sperm NONE SEEN   CBC     Status: Abnormal   Collection Time: 09/18/15 10:49 PM  Result Value Ref Range   WBC 8.5 4.0 - 10.5 K/uL   RBC 4.61 3.87 - 5.11 MIL/uL   Hemoglobin 10.1 (L) 12.0 - 15.0 g/dL   HCT 32.4 (L) 36.0 - 46.0 %   MCV 70.3 (L) 78.0 - 100.0 fL   MCH 21.9 (L) 26.0 - 34.0 pg   MCHC 31.2 30.0 - 36.0 g/dL   RDW 17.6 (H) 11.5 - 15.5 %   Platelets 268 150 - 400 K/uL  ABO/Rh     Status: None (Preliminary result)   Collection Time: 09/18/15 10:49 PM  Result  Value Ref Range   ABO/RH(D) B POS   hCG, quantitative, pregnancy     Status: Abnormal   Collection Time: 09/18/15 10:49 PM  Result Value Ref Range   hCG, Beta Chain, Quant, S 4599 (H) <5 mIU/mL   US Ob Comp Less 14 Wks  09/18/2015  CLINICAL DATA:  Acute onset of pelvic pain.  Initial encounter. EXAM: OBSTETRIC <14 WK [place] AND TRANSVAGINAL OB US TECHNIQUE: Both transabdominal and transvaginal ultrasound  examinations were performed for complete evaluation of the gestation as well as the maternal uterus, adnexal regions, and pelvic cul-de-sac. Transvaginal technique was performed to assess early pregnancy. COMPARISON:  None. FINDINGS: Intrauterine gestational sac: Visualized/normal in shape. Yolk sac:  No Embryo:  No Cardiac Activity: N/A MSD: 6.5  mm   5 w   2  d Subchorionic hemorrhage:  None visualized. Maternal uterus/adnexae: The uterus is otherwise unremarkable. The ovaries are within normal limits. The right ovary measures 3.4 x 2.0 x 2.4 cm, while the left ovary measures 3.7 x 2.5 x 2.2 cm. No suspicious adnexal masses are seen; there is no evidence for ovarian torsion. No free fluid is seen within the pelvic cul-de-sac. IMPRESSION: Single intrauterine gestational sac noted, with a mean sac diameter of 7 mm, corresponding to a gestational age of [redacted] weeks 2 days. This matches the gestational age of [redacted] weeks 2 days by LMP, reflecting an estimated date of delivery of May 11, 2016. No yolk sac or embryo is yet seen, still within normal limits. Electronically Signed   By: Garald Balding M.D.   On: 09/18/2015 23:46   US Ob Transvaginal  09/18/2015  CLINICAL DATA:  Acute onset of pelvic pain.  Initial encounter. EXAM: OBSTETRIC <14 WK [place] AND TRANSVAGINAL OB US TECHNIQUE: Both transabdominal and transvaginal ultrasound examinations were performed for complete evaluation of the gestation as well as the maternal uterus, adnexal regions, and pelvic cul-de-sac. Transvaginal technique was performed to assess  early pregnancy. COMPARISON:  None. FINDINGS: Intrauterine gestational sac: Visualized/normal in shape. Yolk sac:  No Embryo:  No Cardiac Activity: N/A MSD: 6.5  mm   5 w   2  d Subchorionic hemorrhage:  None visualized. Maternal uterus/adnexae: The uterus is otherwise unremarkable. The ovaries are within normal limits. The right ovary measures 3.4 x 2.0 x 2.4 cm, while the left ovary measures 3.7 x 2.5 x 2.2 cm. No suspicious adnexal masses are seen; there is no evidence for ovarian torsion. No free fluid is seen within the pelvic cul-de-sac. IMPRESSION: Single intrauterine gestational sac noted, with a mean sac diameter of 7 mm, corresponding to a gestational age of [redacted] weeks 2 days. This matches the gestational age of [redacted] weeks 2 days by LMP, reflecting an estimated date of delivery of May 11, 2016. No yolk sac or embryo is yet seen, still within normal limits. Electronically Signed   By: Garald Balding M.D.   On: 09/18/2015 23:46     MAU Course  Procedures  MDM   Assessment and Plan   1. Pregnancy, location unknown   2. Pelvic pain affecting pregnancy in first trimester, antepartum    DC home Comfort measures reviewed  1st Trimester precautions  Bleeding precautions Ectopic precautions RX: none  Return to MAU as needed FU with OB as planned  Follow-up Information    Follow up with [facility].   Specialty:  Obstetrics and Gynecology   Why:  Wed 09/21/15 at 11:00 for blood work   Contact information:   [place] [phone]        Mathis Bud 09/18/2015, 11:05 PM

## 2015-09-19 DIAGNOSIS — O26891 Other specified pregnancy related conditions, first trimester: Secondary | ICD-10-CM | POA: Diagnosis not present

## 2015-09-19 LAB — ABO/RH: ABO/RH(D): B POS

## 2015-09-19 LAB — HIV ANTIBODY (ROUTINE TESTING W REFLEX): HIV SCREEN 4TH GENERATION: NONREACTIVE

## 2015-09-19 LAB — RPR: RPR Ser Ql: NONREACTIVE

## 2015-09-19 LAB — GC/CHLAMYDIA PROBE AMP (~~LOC~~) NOT AT ARMC
CHLAMYDIA, DNA PROBE: NEGATIVE
NEISSERIA GONORRHEA: NEGATIVE

## 2015-09-21 ENCOUNTER — Ambulatory Visit (INDEPENDENT_AMBULATORY_CARE_PROVIDER_SITE_OTHER): Payer: Medicare Other | Admitting: Family

## 2015-09-21 DIAGNOSIS — O0281 Inappropriate change in quantitative human chorionic gonadotropin (hCG) in early pregnancy: Secondary | ICD-10-CM | POA: Diagnosis present

## 2015-09-21 DIAGNOSIS — O3680X0 Pregnancy with inconclusive fetal viability, not applicable or unspecified: Secondary | ICD-10-CM

## 2015-09-21 LAB — HCG, QUANTITATIVE, PREGNANCY: hCG, Beta Chain, Quant, S: 5452 m[IU]/mL — ABNORMAL HIGH

## 2015-09-21 NOTE — Progress Notes (Signed)
Crystal Mora  is a 40 y.o. [id number] at 68w5dwho [facility]. The patient was seen in MAU on 09/18/15 for pelvic pain and had quant hCG of 4599 and UKoreashowed gestational sac with mean sac diameter of 798m  No yolk sac or embryo seen. She denies pain, vaginal bleeding or fever today.   OB History  Gravida Para Term Preterm AB SAB TAB Ectopic Multiple Living  '12 8 3 5 3 1 2 '$ 0 0 8    # Outcome Date GA Lbr Len/2nd Weight Sex Delivery Anes PTL Lv  12 Current           11 Term 09/24/12 [id number] CS-LTranv   Y  10 Term 08/25/09 37w12w0d CS-LTranv   Y  9 Term 09/01/08 37w073w0dCS-LTranv   Y  8 Preterm 06/25/07 53w0d29w0dS-LTranv   Y  7 Preterm 09/17/02 [redacted]w[redacted]d 26w0dg-Spont   Y  6 Preterm 12/27/00 [redacted]w[redacted]d  [redacted]w[redacted]d-Spont   Y  5 Preterm 08/14/98 [redacted]w[redacted]d   32w0dSpont   Y  4 Preterm 12/04/94 [redacted]w[redacted]d   F11w0dpont   Y  3 SAB           2 TAB           1 TAB               Past Medical History  Diagnosis Date  . Anxiety   . Depression   . Bipolar 1 disorder ([facility])   . IBrooksmnia   . Thyroid disease   . Hypertension     states she has not took BP medication for 2 years     LMP 08/05/2015  CONSTITUTIONAL: Well-developed, well-nourished female in no acute distress.  ENT: External right and left ear normal.  EYES: EOM intact, conjunctivae normal.  MUSCULOSKELETAL: Normal range of motion.  CARDIOVASCULAR: Regular heart rate RESPIRATORY: Normal effort NEUROLOGICAL: Alert and oriented to person, place, and time.  SKIN: Skin is warm and dry. No rash noted. Not diaphoretic. No erythema. No pallor. PSYCH: Normal mood and affect. Normal behavior. Normal judgment and thought content.  Results for orders placed or performed in visit on 09/21/15 (from the past 24 hour(s))  hCG, quantitative, pregnancy     Status: Abnormal   Collection Time: 09/21/15  8:36 AM  Result Value Ref Range   hCG, Beta Chain, Quant, S 5452 (H) <5 mIU/mL     A: Inappropriate rise in quant hCG after 48 hours  P: Discharge home First trimester/ectopic precautions discussed Patient will return for follow-up US in 1 we[place]. Order placed and RN to schedule. [facility]  [name] may return to MAU as needed or if her condition were to change or worsen   Harve Spradley N Gwen Pounds2017 10:11 AM

## 2015-09-28 ENCOUNTER — Ambulatory Visit (INDEPENDENT_AMBULATORY_CARE_PROVIDER_SITE_OTHER): Payer: Medicare Other | Admitting: Advanced Practice Midwife

## 2015-09-28 ENCOUNTER — Ambulatory Visit (HOSPITAL_COMMUNITY)
Admission: RE | Admit: 2015-09-28 | Discharge: 2015-09-28 | Disposition: A | Discharge status: Home / Self Care | Payer: Medicare Other | Source: Ambulatory Visit | Attending: Family | Admitting: Family

## 2015-09-28 DIAGNOSIS — Z36 Encounter for antenatal screening of mother: Secondary | ICD-10-CM | POA: Diagnosis present

## 2015-09-28 DIAGNOSIS — O208 Other hemorrhage in early pregnancy: Secondary | ICD-10-CM | POA: Insufficient documentation

## 2015-09-28 DIAGNOSIS — Z3A01 Less than 8 weeks gestation of pregnancy: Secondary | ICD-10-CM | POA: Diagnosis not present

## 2015-09-28 DIAGNOSIS — O020 Blighted ovum and nonhydatidiform mole: Secondary | ICD-10-CM

## 2015-09-28 DIAGNOSIS — O3680X0 Pregnancy with inconclusive fetal viability, not applicable or unspecified: Secondary | ICD-10-CM

## 2015-09-28 MED ORDER — OXYCODONE-ACETAMINOPHEN 5-325 MG PO TABS: 1.0000 | ORAL_TABLET | Freq: Four times a day (QID) | ORAL | Status: DC | PRN

## 2015-09-28 MED ORDER — PROMETHAZINE HCL 25 MG PO TABS: 25.0000 mg | ORAL_TABLET | Freq: Four times a day (QID) | ORAL | Status: DC | PRN

## 2015-09-28 MED ORDER — MISOPROSTOL 200 MCG PO TABS: ORAL_TABLET | ORAL | Status: DC

## 2015-09-28 NOTE — Progress Notes (Signed)
Subjective:  Ultrasounds Results Note  SUBJECTIVE HPI:  [name] is a 40 y.o. [id number] at 64w5dby LMP [facility]. The patient denies abdominal pain or vaginal bleeding.  Upon review of the patient's records, patient was first seen in MAU on 09/18/15 for abd pain.   BHCG on that day was 4500.  Ultrasound showed 5.2 GS w/out YS.  Last seen in MAU on 09/21/15. BHCG was 5400.  Repeat ultrasound was performed earlier today.   Past Medical History  Diagnosis Date  . Anxiety   . Depression   . Bipolar 1 disorder ([place]   . Insomnia   . Thyroid disease   . Hypertension     states she has not took BP medication for 2 years   Past Surgical History  Procedure Laterality Date  . Thyroid surgery    . Cesarean section    . Tubal ligation    . Tubal reversal Bilateral    Social History   Social History  . Marital Status: Divorced    Spouse Name: N/A  . Number of Children: N/A  . Years of Education: N/A   Occupational History  . Not on file.   Social History Main Topics  . Smoking status: Current Every Day Smoker -- 0.50 packs/day    Types: Cigarettes  . Smokeless tobacco: Never Used  . Alcohol Use: No     Comment: Rare  . Drug Use: No  . Sexual Activity: Yes    Birth Control/ Protection: None   Other Topics Concern  . Not on file   Social History Narrative   Current Outpatient Prescriptions on File Prior to Visit  Medication Sig Dispense Refill  . Aspirin-Salicylamide-Caffeine (BC HEADACHE POWDER PO) Take 1 Package by mouth daily as needed (headache).    .Marland KitchenBIOTIN PO Take 1 tablet by mouth daily.     No current facility-administered medications on file prior to visit.   Allergies  Allergen Reactions  . Penicillins Hives, Itching and Swelling    Has patient had a PCN reaction causing immediate rash, facial/tongue/throat swelling, SOB or lightheadedness with hypotension: No Has patient had a PCN  reaction causing severe rash involving mucus membranes or skin necrosis: No Has patient had a PCN reaction that required hospitalization No Has patient had a PCN reaction occurring within the last 10 years: Yes If all of the above answers are "NO", then may proceed with Cephalosporin use.   . Vicodin [Hydrocodone-Acetaminophen] Hives and Itching    Pt states tolerated with benadryl    I have reviewed patient's Past Medical Hx, Surgical Hx, Family Hx, Social Hx, medications and allergies.   Review of Systems Review of Systems  Constitutional: Negative for fever and chills.  Gastrointestinal: Negative for nausea, vomiting, abdominal pain, diarrhea and constipation.  Genitourinary: Negative for dysuria.  Musculoskeletal: Negative for back pain.  Neurological: Negative for dizziness and weakness.    Physical Exam  LMP 08/05/2015  GENERAL: Well-developed, well-nourished female in no acute distress.  HEENT: Normocephalic, atraumatic.   LUNGS: Effort normal HEART: Regular rate  SKIN: Warm, dry and without erythema PSYCH: Normal mood and affect NEURO: Alert and oriented x 4 PELVIC: Deferred  LAB RESULTS No results found for this or any previous visit (from the past 24 hour(s)).  IMAGING [place]Ob Comp Less 14 Wks  09/18/2015  CLINICAL DATA:  Acute onset of pelvic pain.  Initial encounter. EXAM: OBSTETRIC <14 WK [place]  TRANSVAGINAL OB US TECHNIQUE: Both transabdominal and transvaginal ultrasound examinations were performed for complete evaluation of the gestation as well as the maternal uterus, adnexal regions, and pelvic cul-de-sac. Transvaginal technique was performed to assess early pregnancy. COMPARISON:  None. FINDINGS: Intrauterine gestational sac: Visualized/normal in shape. Yolk sac:  No Embryo:  No Cardiac Activity: N/A MSD: 6.5  mm   5 w   2  d Subchorionic hemorrhage:  None visualized. Maternal uterus/adnexae: The uterus is otherwise unremarkable. The ovaries are within normal  limits. The right ovary measures 3.4 x 2.0 x 2.4 cm, while the left ovary measures 3.7 x 2.5 x 2.2 cm. No suspicious adnexal masses are seen; there is no evidence for ovarian torsion. No free fluid is seen within the pelvic cul-de-sac. IMPRESSION: Single intrauterine gestational sac noted, with a mean sac diameter of 7 mm, corresponding to a gestational age of [redacted] weeks 2 days. This matches the gestational age of [redacted] weeks 2 days by LMP, reflecting an estimated date of delivery of May 11, 2016. No yolk sac or embryo is yet seen, still within normal limits. Electronically Signed   By: Garald Balding M.D.   On: 09/18/2015 23:46   US Ob Transvaginal  09/28/2015  CLINICAL DATA:  Pregnancy of unknown anatomic location EXAM: TRANSVAGINAL OB ULTRASOUND TECHNIQUE: Transvaginal ultrasound was performed for complete evaluation of the gestation as well as the maternal uterus, adnexal regions, and pelvic cul-de-sac. COMPARISON:  09/18/2015. FINDINGS: Intrauterine gestational sac: Single Yolk sac:  No Embryo:  No Cardiac Activity: Not applicable MSD: 12  mm   6 w   0  d Subchorionic hemorrhage:  Small measuring 2.7 x 1.4 x 2.9 cm. Maternal uterus/adnexae: Right ovary: Normal Left ovary: Normal Other :None Free fluid:  Trace IMPRESSION: 1. Single intrauterine gestational sac within though he of sac or embryo. Findings are suspicious but not yet definitive for failed pregnancy. Recommend follow-up US in 10-14 days for definitive diagnosis. This recommendation follows SRU consensus guidelines: Diagnostic Criteria for Nonviable Pregnancy Early in the First Trimester. [employer] Med 2013; 701:7793-90. 2. Small subchorionic hemorrhage. Electronically Signed   By: Kerby Moors M.D.   On: 09/28/2015 08:48   US Ob Transvaginal  09/18/2015  CLINICAL DATA:  Acute onset of pelvic pain.  Initial encounter. EXAM: OBSTETRIC <14 WK [place] AND TRANSVAGINAL OB US TECHNIQUE: Both transabdominal and transvaginal ultrasound examinations were  performed for complete evaluation of the gestation as well as the maternal uterus, adnexal regions, and pelvic cul-de-sac. Transvaginal technique was performed to assess early pregnancy. COMPARISON:  None. FINDINGS: Intrauterine gestational sac: Visualized/normal in shape. Yolk sac:  No Embryo:  No Cardiac Activity: N/A MSD: 6.5  mm   5 w   2  d Subchorionic hemorrhage:  None visualized. Maternal uterus/adnexae: The uterus is otherwise unremarkable. The ovaries are within normal limits. The right ovary measures 3.4 x 2.0 x 2.4 cm, while the left ovary measures 3.7 x 2.5 x 2.2 cm. No suspicious adnexal masses are seen; there is no evidence for ovarian torsion. No free fluid is seen within the pelvic cul-de-sac. IMPRESSION: Single intrauterine gestational sac noted, with a mean sac diameter of 7 mm, corresponding to a gestational age of [redacted] weeks 2 days. This matches the gestational age of [redacted] weeks 2 days by LMP, reflecting an estimated date of delivery of May 11, 2016. No yolk sac or embryo is yet seen, still within normal limits. Electronically Signed   By: Francoise Schaumann.D.  On: 09/18/2015 23:46         Early Intrauterine Pregnancy Failure  _x__  Documented intrauterine pregnancy failure less than or equal to [redacted] weeks gestation  _x__  No serious current illness  _x__  Baseline Hgb greater than or equal to 10g/dl  _x__  Patient has easily accessible transportation to the hospital  _x__  Clear preference  _x__  Practitioner/physician deems patient reliable  _x__  Counseling by practitioner or physician  _NA__  Patient education by RN  _Verbal consent__  Consent form signed  _NA__  Rho-Gam given by RN if indicated  _x__ Medication dispensed   _x__   Cytotec 800 mcg  _x_   Intravaginally or buccally by patient at home         __   Intravaginally by RN in MAU        __   Rectally by patient at home        __   Rectally by RN in MAU  ___  Ibuprofen 600 mg 1 tablet by mouth every 6  hours as needed #30  _x__  Hydrocodone/acetaminophen 5/325 mg by mouth every 4 to 6 hours as needed  _x__  Phenergan 12.5 mg by mouth every 4 hours as needed for nausea    ASSESSMENT 1. Anembryonic pregnancy     PLAN Discussed minimal increase in GS size, no appearance of YS or FP in 10 days and very inadequate rise in quant suggestive of failed pregnancy. Pt asked for Cytotec. Dicussed option for further expectant management, but pt declined. Requesting workup for SAB x 2. Will refer to Dr. Kerin Perna.  Discharge home in stable condition Patient advised to start/continue taking prenatal vitamins since TTC. Support given. Bleeding precautions. MAU for emergencies   Medication List       This list is accurate as of: 09/28/15 10:19 AM.  Always use your most recent med list.               BC HEADACHE POWDER PO  Take 1 Package by mouth daily as needed (headache).     BIOTIN PO  Take 1 tablet by mouth daily.     misoprostol 200 MCG tablet  Commonly known as:  CYTOTEC  Place four tablets in between your gums and cheeks (two tablets on each side) as instructed OR insert four tablets vaginally     oxyCODONE-acetaminophen 5-325 MG tablet  Commonly known as:  PERCOCET/ROXICET  Take 1-2 tablets by mouth every 6 (six) hours as needed.     promethazine 25 MG tablet  Commonly known as:  PHENERGAN  Take 1 tablet (25 mg total) by mouth every 6 (six) hours as needed for nausea or vomiting.         [place], CNM  09/28/2015  10:14 AM

## 2015-09-28 NOTE — Patient Instructions (Signed)
Incomplete Miscarriage A miscarriage is the sudden loss of an unborn baby (fetus) before the 20th week of pregnancy. In an incomplete miscarriage, parts of the fetus or placenta (afterbirth) remain in the body.  Having a miscarriage can be an emotional experience. Talk with your health care provider about any questions you may have about miscarrying, the grieving process, and your future pregnancy plans. CAUSES   Problems with the fetal chromosomes that make it impossible for the baby to develop normally. Problems with the baby's genes or chromosomes are most often the result of errors that occur by chance as the embryo divides and grows. The problems are not inherited from the parents.  Infection of the cervix or uterus.  Hormone problems.  Problems with the cervix, such as having an incompetent cervix. This is when the tissue in the cervix is not strong enough to hold the pregnancy.  Problems with the uterus, such as an abnormally shaped uterus, uterine fibroids, or congenital abnormalities.  Certain medical conditions.  Smoking, drinking alcohol, or taking illegal drugs.  Trauma. SYMPTOMS   Vaginal bleeding or spotting, with or without cramps or pain.  Pain or cramping in the abdomen or lower back.  Passing fluid, tissue, or blood clots from the vagina. DIAGNOSIS  Your health care provider will perform a physical exam. You may also have an ultrasound to confirm the miscarriage. Blood or urine tests may also be ordered. TREATMENT   Usually, a dilation and curettage (D&C) procedure is performed. During a D&C procedure, the cervix is widened (dilated) and any remaining fetal or placental tissue is gently removed from the uterus.  Antibiotic medicines are prescribed if there is an infection. Other medicines may be given to reduce the size of the uterus (contract) if there is a lot of bleeding.  If you have Rh negative blood and your baby was Rh positive, you will need a Rho (D)  immune globulin shot. This shot will protect any future baby from having Rh blood problems in future pregnancies.  You may be confined to bed rest. This means you should stay in bed and only get up to use the bathroom. HOME CARE INSTRUCTIONS   Rest as directed by your health care provider.  Restrict activity as directed by your health care provider. You may be allowed to continue light activity if curettage was not done but you require further treatment.  Keep track of the number of pads you use each day. Keep track of how soaked (saturated) they are. Record this information.  Do not  use tampons.  Do not douche or have sexual intercourse until approved by your health care provider.  Keep all follow-up appointments for reevaluation and continuing management.  Only take over-the-counter or prescription medicines for pain, fever, or discomfort as directed by your health care provider.  Take antibiotic medicine as directed by your health care provider. Make sure you finish it even if you start to feel better. SEEK IMMEDIATE MEDICAL CARE IF:   You experience severe cramps in your stomach, back, or abdomen.  You have an unexplained temperature (make sure to record these temperatures).  You pass large clots or tissue (save these for your health care provider to inspect).  Your bleeding increases.  You become light-headed, weak, or have fainting episodes. MAKE SURE YOU:   Understand these instructions.  Will watch your condition.  Will get help right away if you are not doing well or get worse.   This information is not intended to  replace advice given to you by your health care provider. Make sure you discuss any questions you have with your health care provider.   Document Released: 07/30/2005 Document Revised: 08/20/2014 Document Reviewed: 02/26/2013 Elsevier Interactive Patient Education [employer].

## 2015-10-13 ENCOUNTER — Ambulatory Visit: Payer: Medicare Other | Admitting: Obstetrics & Gynecology

## 2015-11-10 DIAGNOSIS — K219 Gastro-esophageal reflux disease without esophagitis: Secondary | ICD-10-CM | POA: Insufficient documentation

## 2015-12-02 ENCOUNTER — Emergency Department (HOSPITAL_COMMUNITY)
Admission: EM | Admit: 2015-12-02 | Discharge: 2015-12-02 | Disposition: A | Discharge status: Home / Self Care | Payer: Medicare Other | Attending: Emergency Medicine | Admitting: Emergency Medicine

## 2015-12-02 ENCOUNTER — Encounter (HOSPITAL_COMMUNITY): Payer: Self-pay | Admitting: Emergency Medicine

## 2015-12-02 DIAGNOSIS — Z8659 Personal history of other mental and behavioral disorders: Secondary | ICD-10-CM | POA: Diagnosis not present

## 2015-12-02 DIAGNOSIS — Z880 Allergy status to penicillin: Secondary | ICD-10-CM | POA: Diagnosis not present

## 2015-12-02 DIAGNOSIS — Z8669 Personal history of other diseases of the nervous system and sense organs: Secondary | ICD-10-CM | POA: Insufficient documentation

## 2015-12-02 DIAGNOSIS — R21 Rash and other nonspecific skin eruption: Secondary | ICD-10-CM | POA: Insufficient documentation

## 2015-12-02 DIAGNOSIS — Z79899 Other long term (current) drug therapy: Secondary | ICD-10-CM | POA: Insufficient documentation

## 2015-12-02 DIAGNOSIS — I10 Essential (primary) hypertension: Secondary | ICD-10-CM | POA: Diagnosis not present

## 2015-12-02 DIAGNOSIS — L539 Erythematous condition, unspecified: Secondary | ICD-10-CM | POA: Insufficient documentation

## 2015-12-02 DIAGNOSIS — F17210 Nicotine dependence, cigarettes, uncomplicated: Secondary | ICD-10-CM | POA: Insufficient documentation

## 2015-12-02 DIAGNOSIS — Z8639 Personal history of other endocrine, nutritional and metabolic disease: Secondary | ICD-10-CM | POA: Diagnosis not present

## 2015-12-02 LAB — CBC WITH DIFFERENTIAL/PLATELET
BASOS ABS: 0 10*3/uL (ref 0.0–0.1)
BASOS PCT: 0 %
EOS ABS: 0.1 10*3/uL (ref 0.0–0.7)
Eosinophils Relative: 1 %
HCT: 32.2 % — ABNORMAL LOW (ref 36.0–46.0)
Hemoglobin: 9.6 g/dL — ABNORMAL LOW (ref 12.0–15.0)
LYMPHS PCT: 24 %
Lymphs Abs: 2.4 10*3/uL (ref 0.7–4.0)
MCH: 21 pg — AB (ref 26.0–34.0)
MCHC: 29.8 g/dL — AB (ref 30.0–36.0)
MCV: 70.5 fL — ABNORMAL LOW (ref 78.0–100.0)
MONO ABS: 0.5 10*3/uL (ref 0.1–1.0)
Monocytes Relative: 5 %
NEUTROS ABS: 6.8 10*3/uL (ref 1.7–7.7)
NEUTROS PCT: 70 %
PLATELETS: 338 10*3/uL (ref 150–400)
RBC: 4.57 MIL/uL (ref 3.87–5.11)
RDW: 17 % — ABNORMAL HIGH (ref 11.5–15.5)
WBC: 9.8 10*3/uL (ref 4.0–10.5)

## 2015-12-02 LAB — COMPREHENSIVE METABOLIC PANEL
ALK PHOS: 78 U/L (ref 38–126)
ALT: 11 U/L — AB (ref 14–54)
ANION GAP: 9 (ref 5–15)
AST: 18 U/L (ref 15–41)
Albumin: 2.7 g/dL — ABNORMAL LOW (ref 3.5–5.0)
BILIRUBIN TOTAL: 0.3 mg/dL (ref 0.3–1.2)
BUN: 9 mg/dL (ref 6–20)
CALCIUM: 8.2 mg/dL — AB (ref 8.9–10.3)
CO2: 22 mmol/L (ref 22–32)
CREATININE: 0.94 mg/dL (ref 0.44–1.00)
Chloride: 105 mmol/L (ref 101–111)
Glucose, Bld: 120 mg/dL — ABNORMAL HIGH (ref 65–99)
Potassium: 3.2 mmol/L — ABNORMAL LOW (ref 3.5–5.1)
SODIUM: 136 mmol/L (ref 135–145)
TOTAL PROTEIN: 5.8 g/dL — AB (ref 6.5–8.1)

## 2015-12-02 MED ORDER — DEXAMETHASONE 4 MG PO TABS
10.0000 mg | ORAL_TABLET | Freq: Once | ORAL | Status: AC
  Administered 2015-12-02: 10 mg via ORAL
  Filled 2015-12-02: qty 3

## 2015-12-02 MED ORDER — HYDROXYZINE HCL 25 MG PO TABS: 25.0000 mg | ORAL_TABLET | Freq: Four times a day (QID) | ORAL | Status: DC

## 2015-12-02 MED ORDER — HYDROXYZINE HCL 25 MG PO TABS
50.0000 mg | ORAL_TABLET | Freq: Once | ORAL | Status: AC
  Administered 2015-12-02: 50 mg via ORAL
  Filled 2015-12-02: qty 2

## 2015-12-02 NOTE — ED Notes (Signed)
Started 2 weeks ago with itching hives-like rash. Started this am with swelling, tongue and lips tingling.

## 2015-12-02 NOTE — Discharge Instructions (Signed)

## 2015-12-02 NOTE — ED Provider Notes (Signed)
CSN: [id number]     Arrival date & time 12/02/15  9678 History   First MD Initiated Contact with Patient 12/02/15 [phone]     Chief Complaint  Patient presents with  . Allergic Reaction     (Consider location/radiation/quality/duration/timing/severity/associated sxs/prior Treatment) Patient is a 40 y.o. female presenting with allergic reaction and general illness. The history is provided by the patient, a relative and a parent.  Allergic Reaction Presenting symptoms: rash   Presenting symptoms: no wheezing   Severity:  Moderate Prior allergic episodes:  No prior episodes Context comment:  Unknown Relieved by:  Nothing Worsened by:  Nothing tried Ineffective treatments:  None tried Illness Severity:  Mild Onset quality:  Gradual Duration:  2 months Timing:  Intermittent Progression:  Waxing and waning Associated symptoms: rash   Associated symptoms: no chest pain, no congestion, no fever, no headaches, no myalgias, no nausea, no rhinorrhea, no shortness of breath, no vomiting and no wheezing    40 yo F with A chief complaint of an itchy rash. This been going on for at least 2 months. Patient has seen her family doctor as well as an allergist. The allergist that this was not allergic reaction. Patient has continued to have symptoms. Usually worse in the morning. Takes Benadryl with some improvement. Was just on a 21 day steroid taper and feels like it has recurred.  Past Medical History  Diagnosis Date  . Anxiety   . Depression   . Bipolar 1 disorder ([place])   . Insomnia   . Thyroid disease   . Hypertension     states she has not took BP medication for 2 years   Past Surgical History  Procedure Laterality Date  . Thyroid surgery    . Cesarean section    . Tubal ligation    . Tubal reversal Bilateral    Family History  Problem Relation Age of Onset  . Depression Mother   . Mental illness Mother   . Asthma Mother   . COPD Mother   . Diabetes Mother   . Mental illness  Brother   . Mental illness Daughter   . Diabetes Maternal Aunt   . Mental illness Maternal Aunt   . Depression Maternal Aunt   . Diabetes Maternal Grandmother    Social History  Substance Use Topics  . Smoking status: Current Every Day Smoker -- 0.50 packs/day    Types: Cigarettes  . Smokeless tobacco: Never Used  . Alcohol Use: Yes     Comment: Rare   OB History    Gravida Para Term Preterm AB TAB SAB Ectopic Multiple Living   '12 8 3 5 3 2 1 '$ 0 0 8     Review of Systems  Constitutional: Negative for fever and chills.  HENT: Negative for congestion and rhinorrhea.   Eyes: Negative for redness and visual disturbance.  Respiratory: Negative for shortness of breath and wheezing.   Cardiovascular: Negative for chest pain and palpitations.  Gastrointestinal: Negative for nausea and vomiting.  Genitourinary: Negative for dysuria and urgency.  Musculoskeletal: Negative for myalgias and arthralgias.  Skin: Positive for rash. Negative for pallor and wound.  Neurological: Negative for dizziness and headaches.      Allergies  Biaxin; Penicillins; Percocet; and Vicodin  Home Medications   Prior to Admission medications   Medication Sig Start Date End Date Taking? Authorizing Provider  Aspirin-Salicylamide-Caffeine (BC HEADACHE POWDER PO) Take 1 Package by mouth daily as needed (headache).   Yes Historical Provider, MD  BIOTIN PO Take 1 tablet by mouth daily.   Yes Historical Provider, MD  hydrOXYzine (ATARAX/VISTARIL) 25 MG tablet Take 1 tablet (25 mg total) by mouth every 6 (six) hours. 12/02/15   Deno Etienne, DO  misoprostol (CYTOTEC) 200 MCG tablet Place four tablets in between your gums and cheeks (two tablets on each side) as instructed OR insert four tablets vaginally Patient not taking: Reported on 12/02/2015 09/28/15   Manya Silvas, CNM  oxyCODONE-acetaminophen (PERCOCET/ROXICET) 5-325 MG tablet Take 1-2 tablets by mouth every 6 (six) hours as needed. Patient not taking:  Reported on 12/02/2015 09/28/15   Manya Silvas, CNM  promethazine (PHENERGAN) 25 MG tablet Take 1 tablet (25 mg total) by mouth every 6 (six) hours as needed for nausea or vomiting. Patient not taking: Reported on 12/02/2015 09/28/15   Manya Silvas, CNM   BP 125/83 mmHg  Pulse 92  Temp(Src) 98.6 F (37 C) (Oral)  Resp 16  SpO2 100%  LMP 12/02/2015  Breastfeeding? Unknown Physical Exam  Constitutional: She is oriented to person, place, and time. She appears well-developed and well-nourished. No distress.  HENT:  Head: Normocephalic and atraumatic.  No signs of posterior oropharyngeal swelling. No difficulty swallowing. No stridor.  Eyes: EOM are normal. Pupils are equal, round, and reactive to light.  Neck: Normal range of motion. Neck supple.  Cardiovascular: Normal rate and regular rhythm.  Exam reveals no gallop and no friction rub.   No murmur heard. Pulmonary/Chest: Effort normal. She has no wheezes. She has no rales.  Abdominal: Soft. She exhibits no distension. There is no tenderness.  Musculoskeletal: She exhibits no edema or tenderness.  Neurological: She is alert and oriented to person, place, and time.  Skin: Skin is warm and dry. She is not diaphoretic.  Diffuse raised erythematous plaques. Pruritic.  Psychiatric: She has a normal mood and affect. Her behavior is normal.  Nursing note and vitals reviewed.   ED Course  Procedures (including critical care time) Labs Review Labs Reviewed  COMPREHENSIVE METABOLIC PANEL - Abnormal; Notable for the following:    Potassium 3.2 (*)    Glucose, Bld 120 (*)    Calcium 8.2 (*)    Total Protein 5.8 (*)    Albumin 2.7 (*)    ALT 11 (*)    All other components within normal limits  CBC WITH DIFFERENTIAL/PLATELET - Abnormal; Notable for the following:    Hemoglobin 9.6 (*)    HCT 32.2 (*)    MCV 70.5 (*)    MCH 21.0 (*)    MCHC 29.8 (*)    RDW 17.0 (*)    All other components within normal limits    Imaging Review No  results found. I have personally reviewed and evaluated these images and lab results as part of my medical decision-making.   EKG Interpretation None      MDM   Final diagnoses:  Skin rash    40 yo M with a chief complaint of an itchy rash. Been going on for 2 months. Rash appears hive-like in nature. We'll obtain a CMP to evaluate for hyperbilirubinemia. If negative feel the patient likely needs to follow-up with a dermatologist. She has recently tested for HIV and was negative so will not repeat today.   Bili normal.  D/c home.   3:20 PM:  I have discussed the diagnosis/risks/treatment options with the patient and family and believe the pt to be eligible for discharge home to follow-up with PCP. We also discussed returning to the  ED immediately if new or worsening sx occur. We discussed the sx which are most concerning (e.g., sudden worsening pain, fever, inability to tolerate by mouth) that necessitate immediate return. Medications administered to the patient during their visit and any new prescriptions provided to the patient are listed below.  Medications given during this visit Medications  hydrOXYzine (ATARAX/VISTARIL) tablet 50 mg (50 mg Oral Given 12/02/15 1055)  dexamethasone (DECADRON) tablet 10 mg (10 mg Oral Given 12/02/15 1054)    Discharge Medication List as of 12/02/2015 11:03 AM    START taking these medications   Details  hydrOXYzine (ATARAX/VISTARIL) 25 MG tablet Take 1 tablet (25 mg total) by mouth every 6 (six) hours., Starting 12/02/2015, Until Discontinued, Print        The patient appears reasonably screen and/or stabilized for discharge and I doubt any other medical condition or other [facility] requiring further screening, evaluation, or treatment in the ED at this time prior to discharge.    Deno Etienne, DO 12/02/15 1520

## 2015-12-09 ENCOUNTER — Encounter (HOSPITAL_COMMUNITY): Payer: Self-pay | Admitting: *Deleted

## 2015-12-09 ENCOUNTER — Emergency Department (HOSPITAL_COMMUNITY)
Admission: EM | Admit: 2015-12-09 | Discharge: 2015-12-09 | Disposition: A | Discharge status: Home / Self Care | Payer: Medicare Other | Attending: Emergency Medicine | Admitting: Emergency Medicine

## 2015-12-09 DIAGNOSIS — F319 Bipolar disorder, unspecified: Secondary | ICD-10-CM | POA: Insufficient documentation

## 2015-12-09 DIAGNOSIS — R2232 Localized swelling, mass and lump, left upper limb: Secondary | ICD-10-CM | POA: Diagnosis not present

## 2015-12-09 DIAGNOSIS — X58XXXD Exposure to other specified factors, subsequent encounter: Secondary | ICD-10-CM | POA: Insufficient documentation

## 2015-12-09 DIAGNOSIS — Z79899 Other long term (current) drug therapy: Secondary | ICD-10-CM | POA: Insufficient documentation

## 2015-12-09 DIAGNOSIS — Z880 Allergy status to penicillin: Secondary | ICD-10-CM | POA: Diagnosis not present

## 2015-12-09 DIAGNOSIS — Z8619 Personal history of other infectious and parasitic diseases: Secondary | ICD-10-CM | POA: Diagnosis not present

## 2015-12-09 DIAGNOSIS — Z8739 Personal history of other diseases of the musculoskeletal system and connective tissue: Secondary | ICD-10-CM | POA: Diagnosis not present

## 2015-12-09 DIAGNOSIS — M79642 Pain in left hand: Secondary | ICD-10-CM

## 2015-12-09 DIAGNOSIS — M7989 Other specified soft tissue disorders: Secondary | ICD-10-CM

## 2015-12-09 DIAGNOSIS — F419 Anxiety disorder, unspecified: Secondary | ICD-10-CM | POA: Diagnosis not present

## 2015-12-09 DIAGNOSIS — I10 Essential (primary) hypertension: Secondary | ICD-10-CM | POA: Diagnosis not present

## 2015-12-09 DIAGNOSIS — T7840XD Allergy, unspecified, subsequent encounter: Secondary | ICD-10-CM | POA: Diagnosis not present

## 2015-12-09 DIAGNOSIS — D649 Anemia, unspecified: Secondary | ICD-10-CM | POA: Diagnosis not present

## 2015-12-09 MED ORDER — FERROUS SULFATE 325 (65 FE) MG PO TABS: 325.0000 mg | ORAL_TABLET | Freq: Every day | ORAL | Status: DC

## 2015-12-09 MED ORDER — IMAK COMPRESSION GLOVES MISC: Status: DC

## 2015-12-09 MED ORDER — HYDROCODONE-ACETAMINOPHEN 5-325 MG PO TABS: 1.0000 | ORAL_TABLET | Freq: Four times a day (QID) | ORAL | Status: DC | PRN

## 2015-12-09 NOTE — ED Notes (Signed)
Pt states she was tx for an allergic rxn on Wed when here "whole body swelled up".  Today experiencing L hand pain and continued swelling only in her hand and she states she cannot move the 3rd and 4th digits of her L hand.

## 2015-12-09 NOTE — Discharge Instructions (Signed)
Continue taking ibuprofen as directed for inflammation and pain with norco for breakthrough pain. Do not drive or operate machinery with pain medication use. Use a frozen water bottle to ice and stretch the area by applying gentle pressure and rolling it along the palm into the fingers. Don't use the ice bottle for any more than 20 minutes at a time every hour for each. Use compression glove or ace wrap to provide some compression to the swollen area. Elevate your arm above your heart to improve the swelling. Your labs from the other day showed mild anemia, start taking iron supplementation as directed, take with orange juice. Follow up with your primary care physician for recheck of ongoing symptoms and recheck of your anemia in the next 1-2 weeks. Follow up with the hand specialist in 1-2 weeks for ongoing management of your hand pain. Return to ER for emergent changing or worsening of symptoms.     Edema Edema is an abnormal buildup of fluids. It is more common in your legs and thighs. Painless swelling of the feet and ankles is more likely as a person ages. It also is common in looser skin, like around your eyes. HOME CARE   Keep the affected body part above the level of the heart while lying down.  Do not sit still or stand for a long time.  Do not put anything right under your knees when you lie down.  Do not wear tight clothes on your upper legs.  Exercise your legs to help the puffiness (swelling) go down.  Wear elastic bandages or support stockings as told by your doctor.  A low-salt diet may help lessen the puffiness.  Only take medicine as told by your doctor. GET HELP IF:  Treatment is not working.  You have heart, liver, or kidney disease and notice that your skin looks puffy or shiny.  You have puffiness in your legs that does not get better when you raise your legs.  You have sudden weight gain for no reason. GET HELP RIGHT AWAY IF:   You have shortness of breath or  chest pain.  You cannot breathe when you lie down.  You have pain, redness, or warmth in the areas that are puffy.  You have heart, liver, or kidney disease and get edema all of a sudden.  You have a fever and your symptoms get worse all of a sudden. MAKE SURE YOU:   Understand these instructions.  Will watch your condition.  Will get help right away if you are not doing well or get worse.   This information is not intended to replace advice given to you by your health care provider. Make sure you discuss any questions you have with your health care provider.   Document Released: 01/16/2008 Document Revised: 08/04/2013 Document Reviewed: 05/22/2013 Elsevier Interactive Patient Education 2016 [employer]  Musculoskeletal Pain Musculoskeletal pain is muscle and boney aches and pains. These pains can occur in any part of the body. Your caregiver may treat you without knowing the cause of the pain. They may treat you if blood or urine tests, X-rays, and other tests were normal.  CAUSES There is often not a definite cause or reason for these pains. These pains may be caused by a type of germ (virus). The discomfort may also come from overuse. Overuse includes working out too hard when your body is not fit. Boney aches also come from weather changes. Bone is sensitive to atmospheric pressure changes. HOME CARE INSTRUCTIONS  Ask when your test results will be ready. Make sure you get your test results.  Only take over-the-counter or prescription medicines for pain, discomfort, or fever as directed by your caregiver. If you were given medications for your condition, do not drive, operate machinery or power tools, or sign legal documents for 24 hours. Do not drink alcohol. Do not take sleeping pills or other medications that may interfere with treatment.  Continue all activities unless the activities cause more pain. When the pain lessens, slowly resume normal activities. Gradually  increase the intensity and duration of the activities or exercise.  During periods of severe pain, bed rest may be helpful. Lay or sit in any position that is comfortable.  Putting ice on the injured area.  Put ice in a bag.  Place a towel between your skin and the bag.  Leave the ice on for 15 to 20 minutes, 3 to 4 times a day.  Follow up with your caregiver for continued problems and no reason can be found for the pain. If the pain becomes worse or does not go away, it may be necessary to repeat tests or do additional testing. Your caregiver may need to look further for a possible cause. SEEK IMMEDIATE MEDICAL CARE IF:  You have pain that is getting worse and is not relieved by medications.  You develop chest pain that is associated with shortness or breath, sweating, feeling sick to your stomach (nauseous), or throw up (vomit).  Your pain becomes localized to the abdomen.  You develop any new symptoms that seem different or that concern you. MAKE SURE YOU:   Understand these instructions.  Will watch your condition.  Will get help right away if you are not doing well or get worse.   This information is not intended to replace advice given to you by your health care provider. Make sure you discuss any questions you have with your health care provider.   Document Released: 07/30/2005 Document Revised: 10/22/2011 Document Reviewed: 04/03/2013 Elsevier Interactive Patient Education 2016 [employer].  Cryotherapy Cryotherapy means treatment with cold. Ice or gel packs can be used to reduce both pain and swelling. Ice is the most helpful within the first 24 to 48 hours after an injury or flare-up from overusing a muscle or joint. Sprains, strains, spasms, burning pain, shooting pain, and aches can all be eased with ice. Ice can also be used when recovering from surgery. Ice is effective, has very few side effects, and is safe for most people to use. PRECAUTIONS  Ice is not a safe  treatment option for people with:  Raynaud phenomenon. This is a condition affecting small blood vessels in the extremities. Exposure to cold may cause your problems to return.  Cold hypersensitivity. There are many forms of cold hypersensitivity, including:  Cold urticaria. Red, itchy hives appear on the skin when the tissues begin to warm after being iced.  Cold erythema. This is a red, itchy rash caused by exposure to cold.  Cold hemoglobinuria. Red blood cells break down when the tissues begin to warm after being iced. The hemoglobin that carry oxygen are passed into the urine because they cannot combine with blood proteins fast enough.  Numbness or altered sensitivity in the area being iced. If you have any of the following conditions, do not use ice until you have discussed cryotherapy with your caregiver:  Heart conditions, such as arrhythmia, angina, or chronic heart disease.  High blood pressure.  Healing wounds or open  skin in the area being iced.  Current infections.  Rheumatoid arthritis.  Poor circulation.  Diabetes. Ice slows the blood flow in the region it is applied. This is beneficial when trying to stop inflamed tissues from spreading irritating chemicals to surrounding tissues. However, if you expose your skin to cold temperatures for too long or without the proper protection, you can damage your skin or nerves. Watch for signs of skin damage due to cold. HOME CARE INSTRUCTIONS Follow these tips to use ice and cold packs safely.  Place a dry or damp towel between the ice and skin. A damp towel will cool the skin more quickly, so you may need to shorten the time that the ice is used.  For a more rapid response, add gentle compression to the ice.  Ice for no more than 10 to 20 minutes at a time. The bonier the area you are icing, the less time it will take to get the benefits of ice.  Check your skin after 5 minutes to make sure there are no signs of a poor  response to cold or skin damage.  Rest 20 minutes or more between uses.  Once your skin is numb, you can end your treatment. You can test numbness by very lightly touching your skin. The touch should be so light that you do not see the skin dimple from the pressure of your fingertip. When using ice, most people will feel these normal sensations in this order: cold, burning, aching, and numbness.  Do not use ice on someone who cannot communicate their responses to pain, such as small children or people with dementia. HOW TO MAKE AN ICE PACK Ice packs are the most common way to use ice therapy. Other methods include ice massage, ice baths, and cryosprays. Muscle creams that cause a cold, tingly feeling do not offer the same benefits that ice offers and should not be used as a substitute unless recommended by your caregiver. To make an ice pack, do one of the following:  Place crushed ice or a bag of frozen vegetables in a sealable plastic bag. Squeeze out the excess air. Place this bag inside another plastic bag. Slide the bag into a pillowcase or place a damp towel between your skin and the bag.  Mix 3 parts water with 1 part rubbing alcohol. Freeze the mixture in a sealable plastic bag. When you remove the mixture from the freezer, it will be slushy. Squeeze out the excess air. Place this bag inside another plastic bag. Slide the bag into a pillowcase or place a damp towel between your skin and the bag. SEEK MEDICAL CARE IF:  You develop white spots on your skin. This may give the skin a blotchy (mottled) appearance.  Your skin turns blue or pale.  Your skin becomes waxy or hard.  Your swelling gets worse. MAKE SURE YOU:   Understand these instructions.  Will watch your condition.  Will get help right away if you are not doing well or get worse.   This information is not intended to replace advice given to you by your health care provider. Make sure you discuss any questions you have  with your health care provider.   Document Released: 03/26/2011 Document Revised: 08/20/2014 Document Reviewed: 03/26/2011 Elsevier Interactive Patient Education 2016 [employer].  Anemia, Nonspecific Anemia is a condition in which the concentration of red blood cells or hemoglobin in the blood is below normal. Hemoglobin is a substance in red blood cells that  carries oxygen to the tissues of the body. Anemia results in not enough oxygen reaching these tissues.  CAUSES  Common causes of anemia include:   Excessive bleeding. Bleeding may be internal or external. This includes excessive bleeding from periods (in women) or from the intestine.   Poor nutrition.   Chronic kidney, thyroid, and liver disease.  Bone marrow disorders that decrease red blood cell production.  Cancer and treatments for cancer.  HIV, AIDS, and their treatments.  Spleen problems that increase red blood cell destruction.  Blood disorders.  Excess destruction of red blood cells due to infection, medicines, and autoimmune disorders. SIGNS AND SYMPTOMS   Minor weakness.   Dizziness.   Headache.  Palpitations.   Shortness of breath, especially with exercise.   Paleness.  Cold sensitivity.  Indigestion.  Nausea.  Difficulty sleeping.  Difficulty concentrating. Symptoms may occur suddenly or they may develop slowly.  DIAGNOSIS  Additional blood tests are often needed. These help your health care provider determine the best treatment. Your health care provider will check your stool for blood and look for other causes of blood loss.  TREATMENT  Treatment varies depending on the cause of the anemia. Treatment can include:   Supplements of iron, vitamin G67, or folic acid.   Hormone medicines.   A blood transfusion. This may be needed if blood loss is severe.   Hospitalization. This may be needed if there is significant continual blood loss.   Dietary changes.  Spleen  removal. HOME CARE INSTRUCTIONS Keep all follow-up appointments. It often takes many weeks to correct anemia, and having your health care provider check on your condition and your response to treatment is very important. SEEK IMMEDIATE MEDICAL CARE IF:   You develop extreme weakness, shortness of breath, or chest pain.   You become dizzy or have trouble concentrating.  You develop heavy vaginal bleeding.   You develop a rash.   You have bloody or black, tarry stools.   You faint.   You vomit up blood.   You vomit repeatedly.   You have abdominal pain.  You have a fever or persistent symptoms for more than 2-3 days.   You have a fever and your symptoms suddenly get worse.   You are dehydrated.  MAKE SURE YOU:  Understand these instructions.  Will watch your condition.  Will get help right away if you are not doing well or get worse.   This information is not intended to replace advice given to you by your health care provider. Make sure you discuss any questions you have with your health care provider.   Document Released: 09/06/2004 Document Revised: 04/01/2013 Document Reviewed: 01/23/2013 Elsevier Interactive Patient Education [employer].

## 2015-12-09 NOTE — ED Provider Notes (Signed)
CSN: [id number]     Arrival date & time 12/09/15  1810 History  By signing my name below, I, Eustaquio Maize, attest that this documentation has been prepared under the direction and in the presence of [address] [employer]. Electronically Signed: Eustaquio Maize, ED Scribe. 12/09/2015. 7:00 PM.   No chief complaint on file.  The history is provided by the patient. No language interpreter was used.    HPI Comments: Crystal Mora is a 40 y.o. female who presents to the Emergency Department complaining of gradual onset, 10/10 constant, sharp, left palmar pain that began this morning. Pt also complains of an inability to extend the 3rd and 4th fingers on the left hand due to pain as well as a tingling sensation.  Pt has been taking 800 mg Ibuprofen, Tylenol, and BC powders for the pain without relief. She has applied heat but has not applied ice or elevated the hand. Her pain is exacerbated with movement of the 3rd and 4th digits of the left hand. Pt complains of swelling to the hand x 1 week. She was seen in the ED on 12/02/2015 (approximatley 1 week ago) for an allergic reaction and diffuse swelling to body. Pt is unsure what caused her allergic reaction, but these allergic reactions that she has had over the last several months always occur after eating foods (shell fish, pizza, etc). She was given Benadryl, an antibiotic, and Prednisone in the remote past with relief, but at her ER visit on 4/21 they switched her to Hydroxyzine. She states the diffuse body swelling has improved but she continues to have swelling to the left hand. She is currently finished with the antibiotic and Prednisone and is currently only on the Hydroxyzine. Denies skin injury or insect bites. Denies weakness, numbness, redness, warmth, fevers, chills, chest pain, shortness of breath, abdominal pain, nausea, vomiting, diarrhea, constipation, dysuria, hematuria, or any other associated symptoms. No hx DVT/PE. No IVDA. No recent  surgery, immobilization, trauma to the hand, or prolonged travel. Pt is not on any estrogens/OCPs. Pt claims she is not allergic to Vicodin but does have an allergy to Percocet despite what's written in her Allergy list.   Of note, in 01/2014 she had an abscess and swelling to her L arm, had an U/S performed which ruled out DVT. No family hx of DVT.   Past Medical History  Diagnosis Date  . Anxiety   . Depression   . Bipolar 1 disorder ([place])   . Insomnia   . Thyroid disease   . Hypertension     states she has not took BP medication for 2 years   Past Surgical History  Procedure Laterality Date  . Thyroid surgery    . Cesarean section    . Tubal ligation    . Tubal reversal Bilateral    Family History  Problem Relation Age of Onset  . Depression Mother   . Mental illness Mother   . Asthma Mother   . COPD Mother   . Diabetes Mother   . Mental illness Brother   . Mental illness Daughter   . Diabetes Maternal Aunt   . Mental illness Maternal Aunt   . Depression Maternal Aunt   . Diabetes Maternal Grandmother    Social History  Substance Use Topics  . Smoking status: Current Every Day Smoker -- 0.50 packs/day    Types: Cigarettes  . Smokeless tobacco: Never Used  . Alcohol Use: Yes     Comment: Rare   OB  History    Gravida Para Term Preterm AB TAB SAB Ectopic Multiple Living   '12 8 3 5 3 2 1 '$ 0 0 8     Review of Systems  Constitutional: Negative for fever and chills.  Respiratory: Negative for shortness of breath.   Cardiovascular: Negative for chest pain.  Gastrointestinal: Negative for nausea, vomiting, abdominal pain, diarrhea and constipation.  Genitourinary: Negative for dysuria and hematuria.  Musculoskeletal: Positive for joint swelling and arthralgias (left hand).  Skin: Negative for color change.       Negative for warmth or erythema  Allergic/Immunologic: Negative for immunocompromised state.  Neurological: Negative for weakness and numbness.       +  Tingling sensation to left 3rd and 4th digits  Psychiatric/Behavioral: Negative for confusion.   A complete 10 system review of systems was obtained and all systems are negative except as noted in the HPI and PMH.   Allergies  Biaxin; Penicillins; Percocet; and Vicodin  Home Medications   Prior to Admission medications   Medication Sig Start Date End Date Taking? Authorizing Provider  Aspirin-Salicylamide-Caffeine (BC HEADACHE POWDER PO) Take 1 Package by mouth daily as needed (headache).    Historical Provider, MD  BIOTIN PO Take 1 tablet by mouth daily.    Historical Provider, MD  hydrOXYzine (ATARAX/VISTARIL) 25 MG tablet Take 1 tablet (25 mg total) by mouth every 6 (six) hours. 12/02/15   Deno Etienne, DO  misoprostol (CYTOTEC) 200 MCG tablet Place four tablets in between your gums and cheeks (two tablets on each side) as instructed OR insert four tablets vaginally Patient not taking: Reported on 12/02/2015 09/28/15   Manya Silvas, CNM  oxyCODONE-acetaminophen (PERCOCET/ROXICET) 5-325 MG tablet Take 1-2 tablets by mouth every 6 (six) hours as needed. Patient not taking: Reported on 12/02/2015 09/28/15   Manya Silvas, CNM  promethazine (PHENERGAN) 25 MG tablet Take 1 tablet (25 mg total) by mouth every 6 (six) hours as needed for nausea or vomiting. Patient not taking: Reported on 12/02/2015 09/28/15   Manya Silvas, CNM   BP 135/83 mmHg  Pulse 104  Temp(Src) 98.6 F (37 C) (Oral)  Resp 19  SpO2 100%  LMP 12/02/2015   Physical Exam  Constitutional: She is oriented to person, place, and time. Vital signs are normal. She appears well-developed and well-nourished.  Non-toxic appearance. No distress.  Afebrile, nontoxic, NAD  HENT:  Head: Normocephalic and atraumatic.  Mouth/Throat: Mucous membranes are normal.  Eyes: Conjunctivae and EOM are normal. Right eye exhibits no discharge. Left eye exhibits no discharge.  Neck: Normal range of motion. Neck supple.  Cardiovascular: Normal  rate and intact distal pulses.   Pulmonary/Chest: Effort normal. No respiratory distress.  Abdominal: Normal appearance. She exhibits no distension.  Musculoskeletal:       Left hand: She exhibits decreased range of motion (due to pain), tenderness and swelling. She exhibits normal capillary refill, no deformity and no laceration. Normal sensation noted. Normal strength noted.       Hands: Left hand with mild amount of swelling noted in the palm and fingers, slightly decreased extension of the 3rd and 4th digits due to pain, with preserved flexion. Mild TTP to the palmar aspect of the hand proximal to the 3rd MCP with a ?cyst palpable along the tendon sheath, no overlying skin changes, no induration or fluctuance of the skin, no abrasions or puncture marks, no deformity. Grip strength preserved, sensation grossly intact in all digits with adequate cap refill and distal pulses intact. No  tenderness or swelling to the forearm. Mild tenderness with passive extension of the 3rd and 4th digits but no pain with passive flexion.  Neurological: She is alert and oriented to person, place, and time. She has normal strength. No sensory deficit.  Skin: Skin is warm, dry and intact. No rash noted.  Psychiatric: She has a normal mood and affect. Her behavior is normal.  Nursing note and vitals reviewed.   ED Course  Procedures (including critical care time)  DIAGNOSTIC STUDIES: Oxygen Saturation is 100% on RA, normal by my interpretation.    COORDINATION OF CARE: 6:59 PM-Discussed treatment plan with pt at bedside and pt agreed to plan.   Labs Review Labs Reviewed - No data to display Results for orders placed or performed during the hospital encounter of 12/02/15  Comprehensive metabolic panel  Result Value Ref Range   Sodium 136 135 - 145 mmol/L   Potassium 3.2 (L) 3.5 - 5.1 mmol/L   Chloride 105 101 - 111 mmol/L   CO2 22 22 - 32 mmol/L   Glucose, Bld 120 (H) 65 - 99 mg/dL   BUN 9 6 - 20 mg/dL    Creatinine, Ser 0.94 0.44 - 1.00 mg/dL   Calcium 8.2 (L) 8.9 - 10.3 mg/dL   Total Protein 5.8 (L) 6.5 - 8.1 g/dL   Albumin 2.7 (L) 3.5 - 5.0 g/dL   AST 18 15 - 41 U/L   ALT 11 (L) 14 - 54 U/L   Alkaline Phosphatase 78 38 - 126 U/L   Total Bilirubin 0.3 0.3 - 1.2 mg/dL   GFR calc non Af Amer >60 >60 mL/min   GFR calc Af Amer >60 >60 mL/min   Anion gap 9 5 - 15  CBC with Differential  Result Value Ref Range   WBC 9.8 4.0 - 10.5 K/uL   RBC 4.57 3.87 - 5.11 MIL/uL   Hemoglobin 9.6 (L) 12.0 - 15.0 g/dL   HCT 32.2 (L) 36.0 - 46.0 %   MCV 70.5 (L) 78.0 - 100.0 fL   MCH 21.0 (L) 26.0 - 34.0 pg   MCHC 29.8 (L) 30.0 - 36.0 g/dL   RDW 17.0 (H) 11.5 - 15.5 %   Platelets 338 150 - 400 K/uL   Neutrophils Relative % 70 %   Lymphocytes Relative 24 %   Monocytes Relative 5 %   Eosinophils Relative 1 %   Basophils Relative 0 %   Neutro Abs 6.8 1.7 - 7.7 K/uL   Lymphs Abs 2.4 0.7 - 4.0 K/uL   Monocytes Absolute 0.5 0.1 - 1.0 K/uL   Eosinophils Absolute 0.1 0.0 - 0.7 K/uL   Basophils Absolute 0.0 0.0 - 0.1 K/uL   Smear Review MORPHOLOGY UNREMARKABLE     Imaging Review No results found.   EKG Interpretation None      MDM   Final diagnoses:  Swelling of left hand  Allergic reaction, subsequent encounter  Left hand pain  Anemia, unspecified anemia type    40 y.o. female here with ongoing L hand swelling after an allergic reaction several days ago. On hydroxyzine which helped with the rest of the swelling she had in her body, but the L hand continues to be swollen. Has tenderness in the palm and a knot/?cyst along the tendon sheath, but no abscess palpable. Skin without erythema/warmth or abnormal changes. Doubt DVT, but this hand swelling is very odd. Doubt flexor tenosynovitis, especially given that there was no skin injury to the hand. Very abnormal presentation, and  unclear etiology, but will treat symptomatically with pain meds, compression glove, elevation of extremity, and f/up  with hand specialist for ongoing management of possible ganglion cyst. Discussed ice and massage with water bottle, in order to help ROM of fingers. Of note, pt had labs on 12/02/15 which showed anemia, which has gradually worsened over the course of the last 3 years (Hgb 14 down to 9.6). Will start on iron supplements and have her f/up with PCP. Doubt need for emergent imaging or any other evaluations at this time. I explained the diagnosis and have given explicit precautions to return to the ER including for any other new or worsening symptoms. The patient understands and accepts the medical plan as it's been dictated and I have answered their questions. Discharge instructions concerning home care and prescriptions have been given. The patient is STABLE and is discharged to home in good condition.   I personally performed the services described in this documentation, which was scribed in my presence. The recorded information has been reviewed and is accurate.  BP 135/83 mmHg  Pulse 104  Temp(Src) 98.6 F (37 C) (Oral)  Resp 19  SpO2 100%  LMP 12/02/2015  Meds ordered this encounter  Medications  . HYDROcodone-acetaminophen (NORCO) 5-325 MG tablet    Sig: Take 1-2 tablets by mouth every 6 (six) hours as needed for severe pain.    Dispense:  10 tablet    Refill:  0    Order Specific Question:  Supervising Provider    Answer:  Sabra Heck, BRIAN [[id number]]  . Elastic Bandages & Supports (IMAK COMPRESSION GLOVES) MISC    Sig: Please dispense 1 (one) pair of hand compression gloves    Dispense:  1 each    Refill:  0    Order Specific Question:  Supervising Provider    Answer:  MILLER, BRIAN [[id number]]  . ferrous sulfate 325 (65 FE) MG tablet    Sig: Take 1 tablet (325 mg total) by mouth daily with breakfast. Take with orange juice    Dispense:  30 tablet    Refill:  0    Order Specific Question:  Supervising Provider    Answer:  Jenny Reichmann Camprubi-Soms, PA-C 12/09/15  1913  Elnora Morrison, MD 12/10/15 [phone]

## 2016-01-12 DIAGNOSIS — D649 Anemia, unspecified: Secondary | ICD-10-CM | POA: Insufficient documentation

## 2016-03-01 ENCOUNTER — Encounter (HOSPITAL_COMMUNITY): Payer: Self-pay | Admitting: Emergency Medicine

## 2016-03-01 ENCOUNTER — Emergency Department (HOSPITAL_COMMUNITY)
Admission: EM | Admit: 2016-03-01 | Discharge: 2016-03-01 | Disposition: A | Discharge status: Home / Self Care | Payer: Medicare Other | Attending: Emergency Medicine | Admitting: Emergency Medicine

## 2016-03-01 DIAGNOSIS — F17210 Nicotine dependence, cigarettes, uncomplicated: Secondary | ICD-10-CM | POA: Insufficient documentation

## 2016-03-01 DIAGNOSIS — I10 Essential (primary) hypertension: Secondary | ICD-10-CM | POA: Insufficient documentation

## 2016-03-01 DIAGNOSIS — R21 Rash and other nonspecific skin eruption: Secondary | ICD-10-CM | POA: Diagnosis present

## 2016-03-01 DIAGNOSIS — Z7982 Long term (current) use of aspirin: Secondary | ICD-10-CM | POA: Insufficient documentation

## 2016-03-01 DIAGNOSIS — Z3A01 Less than 8 weeks gestation of pregnancy: Secondary | ICD-10-CM | POA: Insufficient documentation

## 2016-03-01 DIAGNOSIS — L509 Urticaria, unspecified: Secondary | ICD-10-CM | POA: Diagnosis not present

## 2016-03-01 DIAGNOSIS — O26891 Other specified pregnancy related conditions, first trimester: Secondary | ICD-10-CM | POA: Diagnosis not present

## 2016-03-01 DIAGNOSIS — Z79899 Other long term (current) drug therapy: Secondary | ICD-10-CM | POA: Diagnosis not present

## 2016-03-01 DIAGNOSIS — O99331 Smoking (tobacco) complicating pregnancy, first trimester: Secondary | ICD-10-CM | POA: Diagnosis not present

## 2016-03-01 DIAGNOSIS — F319 Bipolar disorder, unspecified: Secondary | ICD-10-CM | POA: Insufficient documentation

## 2016-03-01 LAB — POC URINE PREG, ED: PREG TEST UR: POSITIVE — AB

## 2016-03-01 MED ORDER — CETIRIZINE HCL 10 MG PO TABS: 10.0000 mg | ORAL_TABLET | Freq: Every day | ORAL | Status: DC

## 2016-03-01 NOTE — ED Notes (Addendum)
Pt states she has had frequent outbreaks of unkown cause, the latest being 3 days ago. Hives noted to back and trunk. Trying benadryl and xantac at home w/o relief. Pt is reportedly [redacted] weeks pregnant. Alert and oriented.

## 2016-03-01 NOTE — ED Provider Notes (Signed)
CSN: [id number]     Arrival date & time 03/01/16  2226 History  By signing my name below, I, Ephriam Jenkins, attest that this documentation has been prepared under the direction and in the presence of [employer].  Electronically Signed: Ephriam Jenkins, ED Scribe. 03/01/2016. 11:18 PM.     Chief Complaint  Patient presents with  . Urticaria    The history is provided by the patient. No language interpreter was used.   HPI Comments: Crystal Mora is a 40 y.o. female, currently [redacted] weeks pregnant, who presents to the Emergency Department complaining of a gradually worsening, pruritic rash; unspecified onset. Pt reports that these areas have been spreading. Pt states she has a Hx of similar outbreaks within the past year; this is the 6th episode. Pt saw an allergist recently and states she had no specific allergies. Pt has been taking Benadryl and Zantac with no relief. Pt has applied Aloe Vera and [employer]	[place], with no relief.   Past Medical History  Diagnosis Date  . Anxiety   . Depression   . Bipolar 1 disorder ([place])   . Insomnia   . Thyroid disease   . Hypertension     states she has not took BP medication for 2 years   Past Surgical History  Procedure Laterality Date  . Thyroid surgery    . Cesarean section    . Tubal ligation    . Tubal reversal Bilateral    Family History  Problem Relation Age of Onset  . Depression Mother   . Mental illness Mother   . Asthma Mother   . COPD Mother   . Diabetes Mother   . Mental illness Brother   . Mental illness Daughter   . Diabetes Maternal Aunt   . Mental illness Maternal Aunt   . Depression Maternal Aunt   . Diabetes Maternal Grandmother    Social History  Substance Use Topics  . Smoking status: Current Every Day Smoker -- 0.50 packs/day    Types: Cigarettes  . Smokeless tobacco: Never Used  . Alcohol Use: Yes     Comment: Rare   OB History    Gravida Para Term Preterm AB TAB SAB Ectopic Multiple Living   '12 8 3 5  3 2 1 '$ 0 1 8     Review of Systems  Respiratory: Negative for shortness of breath.   Skin: Positive for rash (diffuse).  All other systems reviewed and are negative.     Allergies  Biaxin; Penicillins; Percocet; and Vicodin  Home Medications   Prior to Admission medications   Medication Sig Start Date End Date Taking? Authorizing Provider  Aspirin-Salicylamide-Caffeine (BC HEADACHE POWDER PO) Take 1 Package by mouth daily as needed (headache).   Yes Historical Provider, MD  diphenhydrAMINE (BENADRYL) 25 MG tablet Take 25 mg by mouth every 6 (six) hours as needed for itching.   Yes Historical Provider, MD  ranitidine (ZANTAC) 150 MG tablet Take 150 mg by mouth at bedtime.   Yes Historical Provider, MD  Elastic Bandages & Supports (IMAK COMPRESSION GLOVES) MISC Please dispense 1 (one) pair of hand compression gloves Patient not taking: Reported on 03/01/2016 12/09/15   Mercedes Camprubi-Soms, PA-C  ferrous sulfate 325 (65 FE) MG tablet Take 1 tablet (325 mg total) by mouth daily with breakfast. Take with orange juice Patient not taking: Reported on 03/01/2016 12/09/15   Mercedes Camprubi-Soms, PA-C  HYDROcodone-acetaminophen (NORCO) 5-325 MG tablet Take 1-2 tablets by mouth every 6 (six) hours  as needed for severe pain. Patient not taking: Reported on 03/01/2016 12/09/15   Mercedes Camprubi-Soms, PA-C  hydrOXYzine (ATARAX/VISTARIL) 25 MG tablet Take 1 tablet (25 mg total) by mouth every 6 (six) hours. Patient not taking: Reported on 03/01/2016 12/02/15   Deno Etienne, DO  misoprostol (CYTOTEC) 200 MCG tablet Place four tablets in between your gums and cheeks (two tablets on each side) as instructed OR insert four tablets vaginally Patient not taking: Reported on 12/02/2015 09/28/15   Manya Silvas, CNM  oxyCODONE-acetaminophen (PERCOCET/ROXICET) 5-325 MG tablet Take 1-2 tablets by mouth every 6 (six) hours as needed. Patient not taking: Reported on 12/02/2015 09/28/15   Manya Silvas, CNM   promethazine (PHENERGAN) 25 MG tablet Take 1 tablet (25 mg total) by mouth every 6 (six) hours as needed for nausea or vomiting. Patient not taking: Reported on 12/02/2015 09/28/15   Manya Silvas, CNM   BP 127/80 mmHg  Pulse 102  Temp(Src) 98.6 F (37 C) (Oral)  Resp 18  SpO2 100%  LMP 01/18/2016 (Exact Date) Physical Exam  Constitutional: She is oriented to person, place, and time. She appears well-developed and well-nourished. No distress.  HENT:  Head: Normocephalic and atraumatic.  Oropharynx is clear No stridor  Neck: Normal range of motion.  Cardiovascular: Normal rate and regular rhythm.   Pulmonary/Chest: Effort normal and breath sounds normal.  Abdominal: Soft. She exhibits no distension. There is no tenderness.  Neurological: She is alert and oriented to person, place, and time.  Skin: Skin is warm and dry. Rash noted. She is not diaphoretic.  Diffuse urticaria  Psychiatric: She has a normal mood and affect. Judgment normal.  Nursing note and vitals reviewed.   ED Course  Procedures  DIAGNOSTIC STUDIES: Oxygen Saturation is 100% on RA, normal by my interpretation.  COORDINATION OF CARE: 11:13 PM-Will order medication. Discussed treatment plan with pt at bedside and pt agreed to plan.     MDM   Final diagnoses:  Urticaria    Patient with hives.  No evidence of anaphylaxis.  VSS.  ~[redacted] weeks pregnant.  No abdominal pain.  Taking benadryl and zantac.  Will add zyrtec.  Recommend dermatology follow-up.  No steroids due to 1st trimester pregnancy.  I personally performed the services described in this documentation, which was scribed in my presence. The recorded information has been reviewed and is accurate.      Montine Circle, PA-C 03/01/16 [place], DO 03/01/16 2333

## 2016-03-01 NOTE — Discharge Instructions (Signed)
Hives Hives are itchy, red, swollen areas of the skin. They can vary in size and location on your body. Hives can come and go for hours or several days (acute hives) or for several weeks (chronic hives). Hives do not spread from person to person (noncontagious). They may get worse with scratching, exercise, and emotional stress. CAUSES   Allergic reaction to food, additives, or drugs.  Infections, including the common cold.  Illness, such as vasculitis, lupus, or thyroid disease.  Exposure to sunlight, heat, or cold.  Exercise.  Stress.  Contact with chemicals. SYMPTOMS   Red or white swollen patches on the skin. The patches may change size, shape, and location quickly and repeatedly.  Itching.  Swelling of the hands, feet, and face. This may occur if hives develop deeper in the skin. DIAGNOSIS  Your caregiver can usually tell what is wrong by performing a physical exam. Skin or blood tests may also be done to determine the cause of your hives. In some cases, the cause cannot be determined. TREATMENT  Mild cases usually get better with medicines such as antihistamines. Severe cases may require an emergency epinephrine injection. If the cause of your hives is known, treatment includes avoiding that trigger.  HOME CARE INSTRUCTIONS   Avoid causes that trigger your hives.  Take antihistamines as directed by your caregiver to reduce the severity of your hives. Non-sedating or low-sedating antihistamines are usually recommended. Do not drive while taking an antihistamine.  Take any other medicines prescribed for itching as directed by your caregiver.  Wear loose-fitting clothing.  Keep all follow-up appointments as directed by your caregiver. SEEK MEDICAL CARE IF:   You have persistent or severe itching that is not relieved with medicine.  You have painful or swollen joints. SEEK IMMEDIATE MEDICAL CARE IF:   You have a fever.  Your tongue or lips are swollen.  You have  trouble breathing or swallowing.  You feel tightness in the throat or chest.  You have abdominal pain. These problems may be the first sign of a life-threatening allergic reaction. Call your local emergency services (911 in [place] MAKE SURE YOU:   Understand these instructions.  Will watch your condition.  Will get help right away if you are not doing well or get worse.   This information is not intended to replace advice given to you by your health care provider. Make sure you discuss any questions you have with your health care provider.   Document Released: 07/30/2005 Document Revised: 08/04/2013 Document Reviewed: 10/23/2011 Elsevier Interactive Patient Education 2016 [employer]  

## 2016-03-05 ENCOUNTER — Emergency Department (HOSPITAL_COMMUNITY)
Admission: EM | Admit: 2016-03-05 | Discharge: 2016-03-05 | Disposition: A | Discharge status: Home / Self Care | Payer: Medicare Other | Attending: Emergency Medicine | Admitting: Emergency Medicine

## 2016-03-05 DIAGNOSIS — L509 Urticaria, unspecified: Secondary | ICD-10-CM | POA: Diagnosis present

## 2016-03-05 DIAGNOSIS — Z79899 Other long term (current) drug therapy: Secondary | ICD-10-CM | POA: Insufficient documentation

## 2016-03-05 DIAGNOSIS — F17210 Nicotine dependence, cigarettes, uncomplicated: Secondary | ICD-10-CM | POA: Diagnosis not present

## 2016-03-05 DIAGNOSIS — I10 Essential (primary) hypertension: Secondary | ICD-10-CM | POA: Diagnosis not present

## 2016-03-05 DIAGNOSIS — Z7982 Long term (current) use of aspirin: Secondary | ICD-10-CM | POA: Diagnosis not present

## 2016-03-05 LAB — COMPREHENSIVE METABOLIC PANEL
ALBUMIN: 2.9 g/dL — AB (ref 3.5–5.0)
ALK PHOS: 70 U/L (ref 38–126)
ALT: 14 U/L (ref 14–54)
ANION GAP: 8 (ref 5–15)
AST: 19 U/L (ref 15–41)
BILIRUBIN TOTAL: 0.4 mg/dL (ref 0.3–1.2)
BUN: 8 mg/dL (ref 6–20)
CALCIUM: 7.8 mg/dL — AB (ref 8.9–10.3)
CO2: 19 mmol/L — ABNORMAL LOW (ref 22–32)
Chloride: 105 mmol/L (ref 101–111)
Creatinine, Ser: 0.75 mg/dL (ref 0.44–1.00)
GLUCOSE: 160 mg/dL — AB (ref 65–99)
POTASSIUM: 3.4 mmol/L — AB (ref 3.5–5.1)
Sodium: 132 mmol/L — ABNORMAL LOW (ref 135–145)
TOTAL PROTEIN: 5.5 g/dL — AB (ref 6.5–8.1)

## 2016-03-05 LAB — URINALYSIS, DIPSTICK ONLY
Bilirubin Urine: NEGATIVE
GLUCOSE, UA: NEGATIVE mg/dL
Hgb urine dipstick: NEGATIVE
KETONES UR: NEGATIVE mg/dL
Leukocytes, UA: NEGATIVE
NITRITE: NEGATIVE
PROTEIN: 30 mg/dL — AB
Specific Gravity, Urine: 1.021 (ref 1.005–1.030)
pH: 6 (ref 5.0–8.0)

## 2016-03-05 MED ORDER — PREDNISONE 20 MG PO TABS
60.0000 mg | ORAL_TABLET | Freq: Once | ORAL | Status: AC
  Administered 2016-03-05: 60 mg via ORAL
  Filled 2016-03-05: qty 3

## 2016-03-05 MED ORDER — FAMOTIDINE 20 MG PO TABS
20.0000 mg | ORAL_TABLET | Freq: Once | ORAL | Status: AC
  Administered 2016-03-05: 20 mg via ORAL
  Filled 2016-03-05: qty 1

## 2016-03-05 MED ORDER — DIPHENHYDRAMINE HCL 25 MG PO TABS: ORAL_TABLET | ORAL | 0 refills | Status: DC

## 2016-03-05 MED ORDER — PREDNISONE 10 MG (21) PO TBPK: 10.0000 mg | ORAL_TABLET | Freq: Every day | ORAL | 0 refills | Status: DC

## 2016-03-05 MED ORDER — FAMOTIDINE 20 MG PO TABS: 20.0000 mg | ORAL_TABLET | Freq: Two times a day (BID) | ORAL | 0 refills | Status: DC

## 2016-03-05 MED ORDER — DIPHENHYDRAMINE HCL 25 MG PO CAPS
25.0000 mg | ORAL_CAPSULE | Freq: Once | ORAL | Status: AC
  Administered 2016-03-05: 25 mg via ORAL
  Filled 2016-03-05: qty 1

## 2016-03-05 NOTE — ED Notes (Signed)
Pt states she feels improved and rash seems less prominent over torso.

## 2016-03-05 NOTE — ED Triage Notes (Signed)
Pt has hives over entire body and extremities- was seen at [facility] 4 days ago for same=-- started 1 week ago-- has been taking benadryl and zyrtec without relief.

## 2016-03-05 NOTE — ED Notes (Signed)
Pt also c/o boil at r axilla x  3 weeks.

## 2016-03-05 NOTE — ED Notes (Signed)
Pt oob to br with steaDY GAIT.

## 2016-03-05 NOTE — ED Provider Notes (Signed)
[facility] DEPT Provider Note   CSN: [id number] Arrival date & time: 03/05/16  6195  First Provider Contact:  First MD Initiated Contact with Patient 03/05/16 0827        History   Chief Complaint Chief Complaint  Patient presents with  . Urticaria    HPI Crystal Mora is a 40 y.o. female.  Patient is a 40 year old female presents emergency Department with 1 week of urticarial rash which has not improved with high doses of Benadryl multiple times per day. She is currently 53w5dpregnant.  She states she has had urticaria before, as recently as a few months ago with a more severe episode that resolved after a few days. She is also seen allergist in the past without any known allergies or triggers.  She was seen 4 days ago in the ER was prescribed 50 mg of Benadryl every 4-6 hours which she has been taking without any relief.  She also reports taking ibuprofen multiple times per day for "swelling."  She has not seen OB/GYN with this pregnancy.  She denies any other associated symptoms including no fever, no stridor, shortness of breath, swelling of lips, sensation of throat closure, wheeze, chest pain, nausea or vomiting. She reports drinking lots of water and having mild decreased appetite. She denies any vaginal symptoms including no vaginal discharge, vaginal bleeding, no pelvic pain or cramping.    The history is provided by the patient.  Rash   This is a recurrent problem. Episode onset: one week ago. The problem has not changed since onset.The problem is associated with nothing. There has been no fever. The rash is present on the right upper leg, right arm, left arm, abdomen, back, torso and head. The pain is at a severity of 0/10. The patient is experiencing no pain. Associated symptoms include itching. Pertinent negatives include no blisters, no pain and no weeping. She has tried antihistamines for the symptoms. The treatment provided no relief. Risk factors: no medications  changes, no new meds, no change in soaps, no new foods, no environmental exposures.       Past Medical History:  Diagnosis Date  . Anxiety   . Bipolar 1 disorder ([place]   . Depression   . Hypertension    states she has not took BP medication for 2 years  . Insomnia   . Thyroid disease     There are no active problems to display for this patient.   Past Surgical History:  Procedure Laterality Date  . CESAREAN SECTION    . THYROID SURGERY    . TUBAL LIGATION    . tubal reversal Bilateral     OB History    Gravida Para Term Preterm AB Living   '12 8 3 5 3 8   '$ SAB TAB Ectopic Multiple Live Births   1 2 0 1         Home Medications    Prior to Admission medications   Medication Sig Start Date End Date Taking? Authorizing Provider  Aspirin-Salicylamide-Caffeine (BC HEADACHE POWDER PO) Take 1 Package by mouth daily as needed (headache).    Historical Provider, MD  cetirizine (ZYRTEC ALLERGY) 10 MG tablet Take 1 tablet (10 mg total) by mouth daily. 03/01/16   RMontine Circle PA-C  diphenhydrAMINE (BENADRYL) 25 MG tablet Take 1-2 tablets (25 to 50 mg) PO Q 4 hours PRN for itching or rash (do not exceed 300 mg in 24 hours) 03/05/16   LDelsa Grana PA-C  Elastic Bandages &  Supports (IMAK COMPRESSION GLOVES) MISC Please dispense 1 (one) pair of hand compression gloves Patient not taking: Reported on 03/01/2016 12/09/15   Mercedes Camprubi-Soms, PA-C  famotidine (PEPCID) 20 MG tablet Take 1 tablet (20 mg total) by mouth 2 (two) times daily. 03/05/16   Delsa Grana, PA-C  ferrous sulfate 325 (65 FE) MG tablet Take 1 tablet (325 mg total) by mouth daily with breakfast. Take with orange juice Patient not taking: Reported on 03/01/2016 12/09/15   Mercedes Camprubi-Soms, PA-C  HYDROcodone-acetaminophen (NORCO) 5-325 MG tablet Take 1-2 tablets by mouth every 6 (six) hours as needed for severe pain. Patient not taking: Reported on 03/01/2016 12/09/15   Mercedes Camprubi-Soms, PA-C  hydrOXYzine  (ATARAX/VISTARIL) 25 MG tablet Take 1 tablet (25 mg total) by mouth every 6 (six) hours. Patient not taking: Reported on 03/01/2016 12/02/15   Deno Etienne, DO  misoprostol (CYTOTEC) 200 MCG tablet Place four tablets in between your gums and cheeks (two tablets on each side) as instructed OR insert four tablets vaginally Patient not taking: Reported on 12/02/2015 09/28/15   Manya Silvas, CNM  oxyCODONE-acetaminophen (PERCOCET/ROXICET) 5-325 MG tablet Take 1-2 tablets by mouth every 6 (six) hours as needed. Patient not taking: Reported on 12/02/2015 09/28/15   Manya Silvas, CNM  predniSONE (STERAPRED UNI-PAK 21 TAB) 10 MG (21) TBPK tablet Take 1 tablet (10 mg total) by mouth daily. Take 6 tabs by mouth daily  for 2 days, then 5 tabs for 2 days, then 4 tabs for 2 days, then 3 tabs for 2 days, 2 tabs for 2 days, then 1 tab by mouth daily for 2 days 03/05/16   Delsa Grana, PA-C  promethazine (PHENERGAN) 25 MG tablet Take 1 tablet (25 mg total) by mouth every 6 (six) hours as needed for nausea or vomiting. Patient not taking: Reported on 12/02/2015 09/28/15   Manya Silvas, CNM  ranitidine (ZANTAC) 150 MG tablet Take 150 mg by mouth at bedtime.    Historical Provider, MD    Family History Family History  Problem Relation Age of Onset  . Depression Mother   . Mental illness Mother   . Asthma Mother   . COPD Mother   . Diabetes Mother   . Mental illness Brother   . Mental illness Daughter   . Diabetes Maternal Aunt   . Mental illness Maternal Aunt   . Depression Maternal Aunt   . Diabetes Maternal Grandmother     Social History Social History  Substance Use Topics  . Smoking status: Current Every Day Smoker    Packs/day: 0.50    Types: Cigarettes  . Smokeless tobacco: Never Used  . Alcohol use Yes     Comment: Rare     Allergies   Biaxin [clarithromycin]; Penicillins; Percocet [oxycodone-acetaminophen]; and Vicodin [hydrocodone-acetaminophen]   Review of Systems Review of Systems    Skin: Positive for itching and rash.  All other systems reviewed and are negative.    Physical Exam Updated Vital Signs BP 115/62   Pulse 97   Temp 98.3 F (36.8 C) (Oral)   Resp 16   Ht '5\' 6"'$  (1.676 m)   Wt 81.6 kg   LMP 01/18/2016 (Exact Date)   SpO2 97%   BMI 29.05 kg/m   Physical Exam  Constitutional: She is oriented to person, place, and time. She appears well-developed and well-nourished. No distress.  HENT:  Head: Normocephalic and atraumatic.  Nose: Nose normal.  Mouth/Throat: Oropharynx is clear and moist. No oropharyngeal exudate.  Eyes: Conjunctivae and  EOM are normal. Pupils are equal, round, and reactive to light. Right eye exhibits no discharge. Left eye exhibits no discharge. No scleral icterus.  Neck: Normal range of motion. No JVD present. No tracheal deviation present. No thyromegaly present.  Cardiovascular: Normal rate, regular rhythm, normal heart sounds and intact distal pulses.  Exam reveals no gallop and no friction rub.   No murmur heard. Pulmonary/Chest: Effort normal and breath sounds normal. No respiratory distress. She has no wheezes. She has no rales. She exhibits no tenderness.  Abdominal: Soft. Bowel sounds are normal. She exhibits no distension and no mass. There is no tenderness. There is no rebound and no guarding.  Musculoskeletal: Normal range of motion. She exhibits no tenderness.  Lymphadenopathy:    She has no cervical adenopathy.  Neurological: She is alert and oriented to person, place, and time. She has normal reflexes. She displays normal reflexes. No cranial nerve deficit. She exhibits normal muscle tone. Coordination normal.  Skin: Skin is warm and dry. Capillary refill takes less than 2 seconds. Rash noted. She is not diaphoretic. There is erythema. No pallor.  Large diffuse erythematous wheals to the back, bilateral upper extremities, few smaller on forehead, large wheal and right thigh (see picture) Bilateral forearms and hands  edematous No lower extremity edema  Psychiatric: She has a normal mood and affect. Her behavior is normal. Judgment and thought content normal.  Nursing note and vitals reviewed.      ED Treatments / Results  Labs (all labs ordered are listed, but only abnormal results are displayed) Labs Reviewed  COMPREHENSIVE METABOLIC PANEL - Abnormal; Notable for the following:       Result Value   Sodium 132 (*)    Potassium 3.4 (*)    CO2 19 (*)    Glucose, Bld 160 (*)    Calcium 7.8 (*)    Total Protein 5.5 (*)    Albumin 2.9 (*)    All other components within normal limits  URINALYSIS, DIPSTICK ONLY - Abnormal; Notable for the following:    APPearance CLOUDY (*)    Protein, ur 30 (*)    All other components within normal limits    EKG  EKG Interpretation None       Radiology No results found.  Procedures Procedures (including critical care time)  Medications Ordered in ED Medications  predniSONE (DELTASONE) tablet 60 mg (60 mg Oral Given 03/05/16 0909)  famotidine (PEPCID) tablet 20 mg (20 mg Oral Given 03/05/16 0909)  diphenhydrAMINE (BENADRYL) capsule 25 mg (25 mg Oral Given 03/05/16 0909)     Initial Impression / Assessment and Plan / ED Course  I have reviewed the triage vital signs and the nursing notes.  Pertinent labs & imaging results that were available during my care of the patient were reviewed by me and considered in my medical decision making (see chart for details).  Clinical Course  Value Comment By Time  Glucose: (!) 160 Random blood glucose elevated will need further monitoring, patient advised that steroids will cause elevated blood sugars temporarily Delsa Grana, PA-C 07/24 5361  Protein: (!) 30 Protenuria, decreased from most recent UA's Urine culture added for pregnancy Delsa Grana, PA-C 07/24 1115  BP: (!) 80/46 BP monitored and WNL thoughout remaineder of pt's time in the ER Delsa Grana, PA-C 07/24 1115   Pt with hives x 1 week, unimproved  with benadryl.  Patient given steroids, Pepcid and Benadryl in the ER.  Patient encouraged to call PCP and requested  OB/GYN referral for follow-up.  All medications reviewed with the patient including all pregnancy possible side effects. She states she was taking ibuprofen she was encouraged to stop this in the first trimester pregnancy as a discomfort indicated. Basic labs and urinalysis obtained to evaluate kidney function.  Pt with + protein in urine, however decreased from most recent labs. Kidney function normal. Patient had mild electrolyte abnormalities, low calcium and protein, will follow up with PCP. Patient had significant improvement of her hives, itching and swelling with treatment in the ER, upon reevaluation, airway continues to be intact without any stridor, posterior oropharyngeal edema, no wheeze or respiratory distress. Tablet intake patient had one low blood pressure reading however has had blood pressure within normal limits. Rest of her stay in the ER.    History discharged home in good condition with stable vital signs.  Given steroid taper, Pepcid and Benadryl, encouraged follow-up PCP and requested OB/GYN referral.    Final Clinical Impressions(s) / ED Diagnoses   Final diagnoses:  Urticaria    New Prescriptions New Prescriptions   DIPHENHYDRAMINE (BENADRYL) 25 MG TABLET    Take 1-2 tablets (25 to 50 mg) PO Q 4 hours PRN for itching or rash (do not exceed 300 mg in 24 hours)   FAMOTIDINE (PEPCID) 20 MG TABLET    Take 1 tablet (20 mg total) by mouth 2 (two) times daily.   PREDNISONE (STERAPRED UNI-PAK 21 TAB) 10 MG (21) TBPK TABLET    Take 1 tablet (10 mg total) by mouth daily. Take 6 tabs by mouth daily  for 2 days, then 5 tabs for 2 days, then 4 tabs for 2 days, then 3 tabs for 2 days, 2 tabs for 2 days, then 1 tab by mouth daily for 2 days     Delsa Grana, PA-C 03/05/16 [place], MD 03/05/16 2138

## 2016-03-05 NOTE — ED Notes (Signed)
Pt states she understands instructions and ambulates home stable with steadfy gait.

## 2016-03-20 ENCOUNTER — Emergency Department (HOSPITAL_COMMUNITY)
Admission: EM | Admit: 2016-03-20 | Discharge: 2016-03-20 | Disposition: A | Discharge status: Home / Self Care | Payer: Medicare Other | Attending: Emergency Medicine | Admitting: Emergency Medicine

## 2016-03-20 ENCOUNTER — Encounter (HOSPITAL_COMMUNITY): Payer: Self-pay | Admitting: Emergency Medicine

## 2016-03-20 DIAGNOSIS — O2686 Pruritic urticarial papules and plaques of pregnancy (PUPPP): Secondary | ICD-10-CM | POA: Insufficient documentation

## 2016-03-20 DIAGNOSIS — Z7982 Long term (current) use of aspirin: Secondary | ICD-10-CM | POA: Insufficient documentation

## 2016-03-20 DIAGNOSIS — Z79899 Other long term (current) drug therapy: Secondary | ICD-10-CM | POA: Insufficient documentation

## 2016-03-20 DIAGNOSIS — I10 Essential (primary) hypertension: Secondary | ICD-10-CM | POA: Diagnosis not present

## 2016-03-20 DIAGNOSIS — O26891 Other specified pregnancy related conditions, first trimester: Secondary | ICD-10-CM | POA: Diagnosis not present

## 2016-03-20 DIAGNOSIS — Z3A08 8 weeks gestation of pregnancy: Secondary | ICD-10-CM | POA: Diagnosis not present

## 2016-03-20 DIAGNOSIS — O99331 Smoking (tobacco) complicating pregnancy, first trimester: Secondary | ICD-10-CM | POA: Insufficient documentation

## 2016-03-20 DIAGNOSIS — F17210 Nicotine dependence, cigarettes, uncomplicated: Secondary | ICD-10-CM | POA: Insufficient documentation

## 2016-03-20 DIAGNOSIS — K5900 Constipation, unspecified: Secondary | ICD-10-CM | POA: Insufficient documentation

## 2016-03-20 MED ORDER — DOCUSATE SODIUM 100 MG PO CAPS: 100.0000 mg | ORAL_CAPSULE | Freq: Two times a day (BID) | ORAL | 0 refills | Status: DC

## 2016-03-20 MED ORDER — TRIAMCINOLONE ACETONIDE 0.1 % EX CREA: 1.0000 "application " | TOPICAL_CREAM | Freq: Two times a day (BID) | CUTANEOUS | 0 refills | Status: DC

## 2016-03-20 MED ORDER — DIPHENHYDRAMINE HCL 25 MG PO CAPS: 25.0000 mg | ORAL_CAPSULE | Freq: Four times a day (QID) | ORAL | 0 refills | Status: DC | PRN

## 2016-03-20 NOTE — ED Triage Notes (Signed)
Pt. reports persistent generalized  itchy skin hives for >2 weeks unrelieved by Benadryl , Pepcid and Prednisone . Airway intact /respirations unlabored / no oral swelling . Denies fever or chills.

## 2016-03-20 NOTE — ED Provider Notes (Signed)
TIME SEEN:  By signing my name below, I, Crystal Mora, attest that this documentation has been prepared under the direction and in the presence of [employer], DO.  Electronically Signed: Julien Nordmann, ED Scribe. 03/20/16. 3:34 AM.   CHIEF COMPLAINT:  Chief Complaint  Patient presents with  . Urticaria     HPI:  HPI Comments: Crystal Mora is a 40 y.o. female who presents to the Emergency Department complaining of recurrent, unchanged, moderate, generalized urticaria onset one month ago. Pt is currently 8 weeks 6 days pregnant. She was seen on 7/24 for the same symptoms and was prescribed benadryl, pepcid, and prednisone which she notes has not given her any relief. Her last menstrual period was June 7th. She will be getting her prenatal care at [facility] starting on Wednesday.  She denies any new soaps, lotions, discharge or medications. No shortness of breath or wheezing. No tongue or lip swelling. No difficulty swallowing or speaking.  Pt also complains of abdominal pain that she describes as a fullness which she believes is due to being constipated. She has a past surgical hx of four cesarean sections and BTL and tubal reversal. Denies vomiting, fever, dysuria, hematuria, vaginal discharge, vaginal bleeding.    [id number]   ROS: See HPI Constitutional: no fever  Eyes: no drainage  ENT: no runny nose   Cardiovascular:  no chest pain  Resp: no SOB  GI: no vomiting GU: no dysuria Integumentary: no rash  Allergy: no hives  Musculoskeletal: no leg swelling  Neurological: no slurred speech ROS otherwise negative  PAST MEDICAL HISTORY/PAST SURGICAL HISTORY:  Past Medical History:  Diagnosis Date  . Anxiety   . Bipolar 1 disorder ([place])   . Depression   . Hypertension    states she has not took BP medication for 2 years  . Insomnia   . Thyroid disease     MEDICATIONS:  Prior to Admission medications   Medication Sig Start Date End Date Taking? Authorizing  Provider  Aspirin-Salicylamide-Caffeine (BC HEADACHE POWDER PO) Take 1 Package by mouth daily as needed (headache).    Historical Provider, MD  cetirizine (ZYRTEC ALLERGY) 10 MG tablet Take 1 tablet (10 mg total) by mouth daily. 03/01/16   Montine Circle, PA-C  diphenhydrAMINE (BENADRYL) 25 MG tablet Take 1-2 tablets (25 to 50 mg) PO Q 4 hours PRN for itching or rash (do not exceed 300 mg in 24 hours) 03/05/16   Delsa Grana, PA-C  Elastic Bandages & Supports (IMAK COMPRESSION GLOVES) MISC Please dispense 1 (one) pair of hand compression gloves Patient not taking: Reported on 03/01/2016 12/09/15   Mercedes Camprubi-Soms, PA-C  famotidine (PEPCID) 20 MG tablet Take 1 tablet (20 mg total) by mouth 2 (two) times daily. 03/05/16   Delsa Grana, PA-C  ferrous sulfate 325 (65 FE) MG tablet Take 1 tablet (325 mg total) by mouth daily with breakfast. Take with orange juice Patient not taking: Reported on 03/01/2016 12/09/15   Mercedes Camprubi-Soms, PA-C  HYDROcodone-acetaminophen (NORCO) 5-325 MG tablet Take 1-2 tablets by mouth every 6 (six) hours as needed for severe pain. Patient not taking: Reported on 03/01/2016 12/09/15   Mercedes Camprubi-Soms, PA-C  hydrOXYzine (ATARAX/VISTARIL) 25 MG tablet Take 1 tablet (25 mg total) by mouth every 6 (six) hours. Patient not taking: Reported on 03/01/2016 12/02/15   Deno Etienne, DO  misoprostol (CYTOTEC) 200 MCG tablet Place four tablets in between your gums and cheeks (two tablets on each side) as instructed OR insert four tablets  vaginally Patient not taking: Reported on 12/02/2015 09/28/15   Manya Silvas, CNM  oxyCODONE-acetaminophen (PERCOCET/ROXICET) 5-325 MG tablet Take 1-2 tablets by mouth every 6 (six) hours as needed. Patient not taking: Reported on 12/02/2015 09/28/15   Manya Silvas, CNM  predniSONE (STERAPRED UNI-PAK 21 TAB) 10 MG (21) TBPK tablet Take 1 tablet (10 mg total) by mouth daily. Take 6 tabs by mouth daily  for 2 days, then 5 tabs for 2 days, then 4 tabs  for 2 days, then 3 tabs for 2 days, 2 tabs for 2 days, then 1 tab by mouth daily for 2 days 03/05/16   Delsa Grana, PA-C  promethazine (PHENERGAN) 25 MG tablet Take 1 tablet (25 mg total) by mouth every 6 (six) hours as needed for nausea or vomiting. Patient not taking: Reported on 12/02/2015 09/28/15   Manya Silvas, CNM  ranitidine (ZANTAC) 150 MG tablet Take 150 mg by mouth at bedtime.    Historical Provider, MD    ALLERGIES:  Allergies  Allergen Reactions  . Biaxin [Clarithromycin]   . Penicillins Hives, Itching and Swelling    Has patient had a PCN reaction causing immediate rash, facial/tongue/throat swelling, SOB or lightheadedness with hypotension: No Has patient had a PCN reaction causing severe rash involving mucus membranes or skin necrosis: No Has patient had a PCN reaction that required hospitalization No Has patient had a PCN reaction occurring within the last 10 years: Yes If all of the above answers are "NO", then may proceed with Cephalosporin use.   Marland Kitchen Percocet [Oxycodone-Acetaminophen] Hives and Itching  . Vicodin [Hydrocodone-Acetaminophen] Hives and Itching    Pt states tolerated with benadryl    SOCIAL HISTORY:  Social History  Substance Use Topics  . Smoking status: Current Every Day Smoker    Packs/day: 0.50    Types: Cigarettes  . Smokeless tobacco: Never Used  . Alcohol use Yes     Comment: Rare    FAMILY HISTORY: Family History  Problem Relation Age of Onset  . Depression Mother   . Mental illness Mother   . Asthma Mother   . COPD Mother   . Diabetes Mother   . Mental illness Brother   . Mental illness Daughter   . Diabetes Maternal Aunt   . Mental illness Maternal Aunt   . Depression Maternal Aunt   . Diabetes Maternal Grandmother     EXAM: BP 145/81 (BP Location: Right Arm)   Pulse (!) 123   Temp 99 F (37.2 C) (Oral)   Resp 16   Ht '5\' 6"'$  (1.676 m)   Wt 187 lb 8 oz (85 kg)   LMP 01/18/2016 (Exact Date)   SpO2 97%   BMI 30.26 kg/m   CONSTITUTIONAL: Alert and oriented and responds appropriately to questions. Well-appearing; well-nourished HEAD: Normocephalic EYES: Conjunctivae clear, PERRL ENT: normal nose; no rhinorrhea; moist mucous membranes; No pharyngeal erythema or petechiae, no tonsillar hypertrophy or exudate, no uvular deviation, no trismus or drooling, normal phonation, no stridor, no dental caries or abscess noted, no Ludwig's angina, tongue sits flat in the bottom of the mouth; no angioedema NECK: Supple, no meningismus, no LAD  CARD: RRR; S1 and S2 appreciated; no murmurs, no clicks, no rubs, no gallops RESP: Normal chest excursion without splinting or tachypnea; breath sounds clear and equal bilaterally; no wheezes, no rhonchi, no rales, no hypoxia or respiratory distress, speaking full sentences ABD/GI: Normal bowel sounds; non-distended; soft, non-tender, no rebound, no guarding, no peritoneal signs BACK:  The back appears  normal and is non-tender to palpation, there is no CVA tenderness EXT: Normal ROM in all joints; non-tender to palpation; no edema; normal capillary refill; no cyanosis, no calf tenderness or swelling    SKIN: Normal color for age and race; warm; diffuse urticaria, no blisters or desquamation, no petechiae or purpura, no rash involving her palms, soles or mucous membranes, no eczematous lesions or lesions consistent with psoriasis NEURO: Moves all extremities equally, sensation to light touch intact diffusely, cranial nerves II through XII intact PSYCH: The patient's mood and manner are appropriate. Grooming and personal hygiene are appropriate.  MEDICAL DECISION MAKING: Patient here with complaints of persistent hives. I do not think this is an allergic reaction. She has had symptoms since finding out she is pregnant. No new soaps, lotions, detergents or medications. I feel this is pruritic urticarial papules and plaques of pregnancy. She has had recent labs that showed normal LFTs. No jaundice  or scleral icterus. Doubt intrahepatic cholestasis. No blisters to suggest pemphigoid gestationis.  This does not look like pustular psoriasis. Have advised her she can continue antihistamines for symptom control. I do not feel she needs another round of oral steroids. Have recommended low potency topical steroid such as triamcinolone cream and I will discharge her with a prescription for the same. She has a follow-up appointment with her OB/GYN tomorrow.   She is complaining of abdominal fullness and not having a bowel movement for several days. Is passing gas. Abdomen is nontender and nondistended. Have offered to check labs, urine today the patient declined saying she will follow-up as an outpatient. Discussed with her that Colace is safe in pregnancy and have recommended increased water and fiber intake. She has no dysuria, hematuria, urinary frequency or urgency, vaginal bleeding or discharge. Again just recently had labs are unremarkable.    At this time, I do not feel there is any life-threatening condition present. I have reviewed and discussed all results (EKG, imaging, lab, urine as appropriate), exam findings with patient/family. I have reviewed nursing notes and appropriate previous records.  I feel the patient is safe to be discharged home without further emergent workup and can continue workup as an outpatient. Discussed usual and customary return precautions. Patient/family verbalize understanding and are comfortable with this plan.  Outpatient follow-up has been provided. All questions have been answered.   I personally performed the services described in this documentation, which was scribed in my presence. The recorded information has been reviewed and is accurate.    [place], DO 03/20/16 [id number]

## 2016-03-28 DIAGNOSIS — Z9889 Other specified postprocedural states: Secondary | ICD-10-CM | POA: Insufficient documentation

## 2016-06-07 ENCOUNTER — Inpatient Hospital Stay (HOSPITAL_COMMUNITY)
Admission: AD | Admit: 2016-06-07 | Discharge: 2016-06-07 | Disposition: A | Discharge status: Home / Self Care | Payer: Medicare Other | Source: Ambulatory Visit | Attending: Obstetrics and Gynecology | Admitting: Obstetrics and Gynecology

## 2016-06-07 ENCOUNTER — Inpatient Hospital Stay (HOSPITAL_COMMUNITY): Payer: Medicare Other

## 2016-06-07 ENCOUNTER — Encounter (HOSPITAL_COMMUNITY): Payer: Self-pay | Admitting: *Deleted

## 2016-06-07 DIAGNOSIS — R109 Unspecified abdominal pain: Secondary | ICD-10-CM | POA: Diagnosis not present

## 2016-06-07 DIAGNOSIS — O26891 Other specified pregnancy related conditions, first trimester: Secondary | ICD-10-CM | POA: Diagnosis not present

## 2016-06-07 DIAGNOSIS — Z3A01 Less than 8 weeks gestation of pregnancy: Secondary | ICD-10-CM | POA: Insufficient documentation

## 2016-06-07 DIAGNOSIS — Z880 Allergy status to penicillin: Secondary | ICD-10-CM | POA: Insufficient documentation

## 2016-06-07 DIAGNOSIS — Z3490 Encounter for supervision of normal pregnancy, unspecified, unspecified trimester: Secondary | ICD-10-CM

## 2016-06-07 DIAGNOSIS — O26899 Other specified pregnancy related conditions, unspecified trimester: Secondary | ICD-10-CM

## 2016-06-07 DIAGNOSIS — O99331 Smoking (tobacco) complicating pregnancy, first trimester: Secondary | ICD-10-CM | POA: Insufficient documentation

## 2016-06-07 DIAGNOSIS — F17210 Nicotine dependence, cigarettes, uncomplicated: Secondary | ICD-10-CM | POA: Insufficient documentation

## 2016-06-07 DIAGNOSIS — R1031 Right lower quadrant pain: Secondary | ICD-10-CM | POA: Insufficient documentation

## 2016-06-07 HISTORY — DX: Myoneural disorder, unspecified: G70.9

## 2016-06-07 LAB — URINALYSIS, ROUTINE W REFLEX MICROSCOPIC
Bilirubin Urine: NEGATIVE
GLUCOSE, UA: NEGATIVE mg/dL
Hgb urine dipstick: NEGATIVE
Ketones, ur: NEGATIVE mg/dL
LEUKOCYTES UA: NEGATIVE
NITRITE: NEGATIVE
PH: 5.5 (ref 5.0–8.0)
Protein, ur: NEGATIVE mg/dL

## 2016-06-07 LAB — POCT PREGNANCY, URINE: Preg Test, Ur: POSITIVE — AB

## 2016-06-07 LAB — CBC
HEMATOCRIT: 37.7 % (ref 36.0–46.0)
Hemoglobin: 12.3 g/dL (ref 12.0–15.0)
MCH: 25.7 pg — ABNORMAL LOW (ref 26.0–34.0)
MCHC: 32.6 g/dL (ref 30.0–36.0)
MCV: 78.7 fL (ref 78.0–100.0)
Platelets: 235 10*3/uL (ref 150–400)
RBC: 4.79 MIL/uL (ref 3.87–5.11)
RDW: 18 % — ABNORMAL HIGH (ref 11.5–15.5)
WBC: 10.5 10*3/uL (ref 4.0–10.5)

## 2016-06-07 LAB — WET PREP, GENITAL
CLUE CELLS WET PREP: NONE SEEN
SPERM: NONE SEEN
TRICH WET PREP: NONE SEEN
Yeast Wet Prep HPF POC: NONE SEEN

## 2016-06-07 LAB — HCG, QUANTITATIVE, PREGNANCY: hCG, Beta Chain, Quant, S: 18518 m[IU]/mL — ABNORMAL HIGH (ref <5)

## 2016-06-07 NOTE — MAU Note (Signed)
Pt reports she has been having abd cramping on her RLQ for 2-3 days. LMP 04/21/16. Has not taken a pregnancy test. 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

## 2016-06-07 NOTE — MAU Provider Note (Signed)
History     CSN: [id number]  Arrival date and time: 06/07/16 1813   First Provider Initiated Contact with Patient 06/07/16 1940      Chief Complaint  Patient presents with  . Abdominal Pain   HPI Crystal Mora is a 40 y.o. [id number] at 78w5dby LMP who presents with abdominal pain. Reports intermittent RLQ pain x 3 days. Describes pain as cramp like and rates 6/10. Has not treated. Nothing makes better or worse. Denies vaginal bleeding, vaginal discharge, n/v/d, constipation, or dysuria. 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   OB History    Gravida Para Term Preterm AB Living   '14 8 3 5 3 8   '$ SAB TAB Ectopic Multiple Live Births   1 2 0 1 8      Past Medical History:  Diagnosis Date  . Anxiety   . Bipolar 1 disorder ([place]   . Depression   . Hypertension    states she has not took BP medication for 2 years  . Insomnia   . Thyroid disease     Past Surgical History:  Procedure Laterality Date  . CESAREAN SECTION    . THYROID SURGERY    . TUBAL LIGATION    . tubal reversal Bilateral     Family History  Problem Relation Age of Onset  . Depression Mother   . Mental illness Mother   . Asthma Mother   . COPD Mother   . Diabetes Mother   . Mental illness Brother   . Mental illness Daughter   . Diabetes Maternal Aunt   . Mental illness Maternal Aunt   . Depression Maternal Aunt   . Diabetes Maternal Grandmother     Social History  Substance Use Topics  . Smoking status: Current Every Day Smoker    Packs/day: 0.50    Types: Cigarettes  . Smokeless tobacco: Never Used  . Alcohol use Yes     Comment: Rare    Allergies:  Allergies  Allergen Reactions  . Biaxin [Clarithromycin]   . Penicillins Hives, Itching and Swelling    Has patient had a PCN reaction causing immediate rash, facial/tongue/throat swelling, SOB or lightheadedness with hypotension: No Has patient had a PCN reaction causing severe rash  involving mucus membranes or skin necrosis: No Has patient had a PCN reaction that required hospitalization No Has patient had a PCN reaction occurring within the last 10 years: Yes If all of the above answers are "NO", then may proceed with Cephalosporin use.   .Marland KitchenPercocet [Oxycodone-Acetaminophen] Hives and Itching  . Vicodin [Hydrocodone-Acetaminophen] Hives and Itching    Pt states tolerated with benadryl    Prescriptions Prior to Admission  Medication Sig Dispense Refill Last Dose  . Aspirin-Salicylamide-Caffeine (BC HEADACHE POWDER PO) Take 1 Package by mouth daily as needed (headache).   Past Month at Unknown time  . cetirizine (ZYRTEC ALLERGY) 10 MG tablet Take 1 tablet (10 mg total) by mouth daily. 30 tablet 0   . diphenhydrAMINE (BENADRYL) 25 mg capsule Take 1-2 capsules (25-50 mg total) by mouth every 6 (six) hours as needed. 60 capsule 0   . docusate sodium (COLACE) 100 MG capsule Take 1 capsule (100 mg total) by mouth every 12 (twelve) hours. 60 capsule 0   . Elastic Bandages & Supports (IMAK COMPRESSION GLOVES) MISC Please dispense 1 (one) pair of hand compression gloves (Patient not taking: Reported on 03/01/2016) 1 each 0   .  famotidine (PEPCID) 20 MG tablet Take 1 tablet (20 mg total) by mouth 2 (two) times daily. 30 tablet 0   . triamcinolone cream (KENALOG) 0.1 % Apply 1 application topically 2 (two) times daily. 30 g 0     Review of Systems  Constitutional: Negative.   Gastrointestinal: Positive for abdominal pain. Negative for constipation, diarrhea, nausea and vomiting.  Genitourinary: Negative.    Physical Exam   Blood pressure 126/84, pulse 102, temperature 98.3 F (36.8 C), resp. rate 18, height '5\' 6"'$  (1.676 m), weight 190 lb 12.8 oz (86.5 kg), last menstrual period 04/21/2016, unknown if currently breastfeeding.  Physical Exam  Nursing note and vitals reviewed. Constitutional: She is oriented to person, place, and time. She appears well-developed and  well-nourished. No distress.  HENT:  Head: Normocephalic and atraumatic.  Eyes: Conjunctivae are normal. Right eye exhibits no discharge. Left eye exhibits no discharge. No scleral icterus.  Neck: Normal range of motion.  Respiratory: Effort normal. No respiratory distress.  GI: Soft. There is no tenderness. There is no rebound.  Genitourinary: Uterus normal. Cervix exhibits no motion tenderness.  Neurological: She is alert and oriented to person, place, and time.  Skin: Skin is warm and dry. She is not diaphoretic.  Psychiatric: She has a normal mood and affect. Her behavior is normal. Judgment and thought content normal.   Results for orders placed or performed during the hospital encounter of 06/07/16 (from the past 24 hour(s))  Urinalysis, Routine w reflex microscopic (not at [facility])     Status: Abnormal   Collection Time: 06/07/16  6:34 PM  Result Value Ref Range   Color, Urine YELLOW YELLOW   APPearance CLEAR CLEAR   Specific Gravity, Urine <1.005 (L) 1.005 - 1.030   pH 5.5 5.0 - 8.0   Glucose, UA NEGATIVE NEGATIVE mg/dL   Hgb urine dipstick NEGATIVE NEGATIVE   Bilirubin Urine NEGATIVE NEGATIVE   Ketones, ur NEGATIVE NEGATIVE mg/dL   Protein, ur NEGATIVE NEGATIVE mg/dL   Nitrite NEGATIVE NEGATIVE   Leukocytes, UA NEGATIVE NEGATIVE  Pregnancy, urine POC     Status: Abnormal   Collection Time: 06/07/16  6:49 PM  Result Value Ref Range   Preg Test, Ur POSITIVE (A) NEGATIVE  CBC     Status: Abnormal   Collection Time: 06/07/16  7:11 PM  Result Value Ref Range   WBC 10.5 4.0 - 10.5 K/uL   RBC 4.79 3.87 - 5.11 MIL/uL   Hemoglobin 12.3 12.0 - 15.0 g/dL   HCT 37.7 36.0 - 46.0 %   MCV 78.7 78.0 - 100.0 fL   MCH 25.7 (L) 26.0 - 34.0 pg   MCHC 32.6 30.0 - 36.0 g/dL   RDW 18.0 (H) 11.5 - 15.5 %   Platelets 235 150 - 400 K/uL  hCG, quantitative, pregnancy     Status: Abnormal   Collection Time: 06/07/16  7:11 PM  Result Value Ref Range   hCG, Beta Chain, Quant, S 18,518 (H) <5  mIU/mL  Wet prep, genital     Status: Abnormal   Collection Time: 06/07/16  7:50 PM  Result Value Ref Range   Yeast Wet Prep HPF POC NONE SEEN NONE SEEN   Trich, Wet Prep NONE SEEN NONE SEEN   Clue Cells Wet Prep HPF POC NONE SEEN NONE SEEN   WBC, Wet Prep HPF POC FEW (A) NONE SEEN   Sperm NONE SEEN    US Ob Comp Less 14 Wks  Result Date: 06/07/2016 CLINICAL DATA:  Right  lower quadrant pain and cramping for 3 days. EXAM: OBSTETRIC <14 WK [place] AND TRANSVAGINAL OB US TECHNIQUE: Both transabdominal and transvaginal ultrasound examinations were performed for complete evaluation of the gestation as well as the maternal uterus, adnexal regions, and pelvic cul-de-sac. Transvaginal technique was performed to assess early pregnancy. COMPARISON:  None. FINDINGS: Intrauterine gestational sac: Single Yolk sac:  Visualized. Embryo:  Visualized. Cardiac Activity: Visualized. Heart Rate: 121  bpm CRL:  4  mm   6 w   1 d                  [place] EDC: 01/30/2017 Subchorionic hemorrhage:  None visualized. Maternal uterus/adnexae: Simple appearing cyst is seen in the right adnexa which measures 5.9 x 5.5 x 5.6 cm. No complex features identified. Left ovary is not directly visualized, however no left adnexal mass identified. No evidence of free fluid. IMPRESSION: Single living IUP measuring 6 weeks 1 day with [place] EDC of 01/30/2017. 5.9 cm simple appearing cyst in right adnexa, which has benign sonographic features. Recommend continued followup by ultrasound in 6 weeks. Electronically Signed   By: Earle Gell M.D.   On: 06/07/2016 20:45   US Ob Transvaginal  Result Date: 06/07/2016 CLINICAL DATA:  Right lower quadrant pain and cramping for 3 days. EXAM: OBSTETRIC <14 WK [place] AND TRANSVAGINAL OB US TECHNIQUE: Both transabdominal and transvaginal ultrasound examinations were performed for complete evaluation of the gestation as well as the maternal uterus, adnexal regions, and pelvic cul-de-sac. Transvaginal technique was  performed to assess early pregnancy. COMPARISON:  None. FINDINGS: Intrauterine gestational sac: Single Yolk sac:  Visualized. Embryo:  Visualized. Cardiac Activity: Visualized. Heart Rate: 121  bpm CRL:  4  mm   6 w   1 d                  [place] EDC: 01/30/2017 Subchorionic hemorrhage:  None visualized. Maternal uterus/adnexae: Simple appearing cyst is seen in the right adnexa which measures 5.9 x 5.5 x 5.6 cm. No complex features identified. Left ovary is not directly visualized, however no left adnexal mass identified. No evidence of free fluid. IMPRESSION: Single living IUP measuring 6 weeks 1 day with [place] EDC of 01/30/2017. 5.9 cm simple appearing cyst in right adnexa, which has benign sonographic features. Recommend continued followup by ultrasound in 6 weeks. Electronically Signed   By: Earle Gell M.D.   On: 06/07/2016 20:45    MAU Course  Procedures MDM +UPT UA, wet prep, GC/chlamydia, CBC, ABO/Rh, quant hCG, HIV, and [place] today to rule out ectopic pregnancy  Waiting for patient to go to ultrasound. Care turned over to [place], NP 06/07/2016 7:56 PM   Assessment and Plan   1. Intrauterine pregnancy   2. Abdominal pain during pregnancy   3. [redacted] weeks gestation of pregnancy    DC home Comfort measures reviewed  1st Trimester precautions  Bleeding precautions RX: none  Return to MAU as needed FU with OB as planned Pregnancy verification letter given   Follow-up [facility] .   Contact information: [place] [address] [phone]

## 2016-06-07 NOTE — Discharge Instructions (Signed)
Prenatal Care Providers [facility]  [phone]     Phone: [place]                      [facility]  '@[place]'$  [place]     Phone: [phone]  Phone: [place]     Phone: [phone]  Phone: [phone]           [facility] [place]                hone: [phone]  Phone: [phone]         [facility]                                          Phone: [place] Dept.                Phone: [phone]  [place])          Phone: [phone] [facility]   Phone: [phone] Safe Medications in Pregnancy   Acne: Benzoyl Peroxide Salicylic Acid  Backache/Headache: Tylenol: 2 regular strength every 4 hours OR              2 Extra strength every 6 hours  Colds/Coughs/Allergies: Benadryl (alcohol free) 25 mg every 6 hours as needed Breath right strips Claritin Cepacol throat lozenges Chloraseptic throat spray Cold-Eeze- up to three times per day Cough drops, alcohol free Flonase (by prescription only) Guaifenesin Mucinex Robitussin DM (plain only, alcohol free) Saline nasal spray/drops Sudafed (pseudoephedrine) & Actifed ** use only after [redacted] weeks gestation and if you do not have high blood pressure Tylenol Vicks Vaporub Zinc lozenges Zyrtec   Constipation: Colace Ducolax suppositories Fleet enema Glycerin suppositories Metamucil Milk of magnesia Miralax Senokot Smooth move tea  Diarrhea: Kaopectate Imodium A-D  *NO pepto Bismol  Hemorrhoids: Anusol Anusol HC Preparation H Tucks  Indigestion: Tums Maalox Mylanta Zantac  Pepcid  Insomnia: Benadryl (alcohol free) '25mg'$  every 6 hours as needed Tylenol  PM Unisom, no Gelcaps  Leg Cramps: Tums MagGel  Nausea/Vomiting:  Bonine Dramamine Emetrol Ginger extract Sea bands Meclizine  Nausea medication to take during pregnancy:  Unisom (doxylamine succinate 25 mg tablets) Take one tablet daily at bedtime. If symptoms are not adequately controlled, the dose can be increased to a maximum recommended dose of two tablets daily (1/2 tablet in the morning, 1/2 tablet mid-afternoon and one at bedtime). Vitamin B6 '100mg'$  tablets. Take one tablet twice a day (up to 200 mg per day).  Skin Rashes: Aveeno products Benadryl cream or '25mg'$  every 6 hours as needed Calamine Lotion 1% cortisone cream  Yeast infection: Gyne-lotrimin 7 Monistat 7   **If taking multiple medications, please check labels to avoid duplicating the same active ingredients **take medication as directed on the label ** Do not exceed 4000 mg of tylenol in 24 hours **Do not take medications that contain aspirin or ibuprofen   First Trimester of Pregnancy The first trimester of pregnancy is from week  1 until the end of week 12 (months 1 through 3). A week after a sperm fertilizes an egg, the egg will implant on the wall of the uterus. This embryo will begin to develop into a baby. Genes from you and your partner are forming the baby. The female genes determine whether the baby is a boy or a girl. At 6-8 weeks, the eyes and face are formed, and the heartbeat can be seen on ultrasound. At the end of 12 weeks, all the baby's organs are formed.  Now that you are pregnant, you will want to do everything you can to have a healthy baby. Two of the most important things are to get good prenatal care and to follow your health care provider's instructions. Prenatal care is all the medical care you receive before the baby's birth. This care will help prevent, find, and treat any problems during the pregnancy and childbirth. BODY CHANGES Your body goes through many changes during pregnancy. The  changes vary from woman to woman.   You may gain or lose a couple of pounds at first.  You may feel sick to your stomach (nauseous) and throw up (vomit). If the vomiting is uncontrollable, call your health care provider.  You may tire easily.  You may develop headaches that can be relieved by medicines approved by your health care provider.  You may urinate more often. Painful urination may mean you have a bladder infection.  You may develop heartburn as a result of your pregnancy.  You may develop constipation because certain hormones are causing the muscles that push waste through your intestines to slow down.  You may develop hemorrhoids or swollen, bulging veins (varicose veins).  Your breasts may begin to grow larger and become tender. Your nipples may stick out more, and the tissue that surrounds them (areola) may become darker.  Your gums may bleed and may be sensitive to brushing and flossing.  Dark spots or blotches (chloasma, mask of pregnancy) may develop on your face. This will likely fade after the baby is born.  Your menstrual periods will stop.  You may have a loss of appetite.  You may develop cravings for certain kinds of food.  You may have changes in your emotions from day to day, such as being excited to be pregnant or being concerned that something may go wrong with the pregnancy and baby.  You may have more vivid and strange dreams.  You may have changes in your hair. These can include thickening of your hair, rapid growth, and changes in texture. Some women also have hair loss during or after pregnancy, or hair that feels dry or thin. Your hair will most likely return to normal after your baby is born. WHAT TO EXPECT AT YOUR PRENATAL VISITS During a routine prenatal visit:  You will be weighed to make sure you and the baby are growing normally.  Your blood pressure will be taken.  Your abdomen will be measured to track your baby's growth.  The fetal  heartbeat will be listened to starting around week 10 or 12 of your pregnancy.  Test results from any previous visits will be discussed. Your health care provider may ask you:  How you are feeling.  If you are feeling the baby move.  If you have had any abnormal symptoms, such as leaking fluid, bleeding, severe headaches, or abdominal cramping.  If you are using any tobacco products, including cigarettes, chewing tobacco, and electronic cigarettes.  If you have  any questions. Other tests that may be performed during your first trimester include:  Blood tests to find your blood type and to check for the presence of any previous infections. They will also be used to check for low iron levels (anemia) and Rh antibodies. Later in the pregnancy, blood tests for diabetes will be done along with other tests if problems develop.  Urine tests to check for infections, diabetes, or protein in the urine.  An ultrasound to confirm the proper growth and development of the baby.  An amniocentesis to check for possible genetic problems.  Fetal screens for spina bifida and Down syndrome.  You may need other tests to make sure you and the baby are doing well.  HIV (human immunodeficiency virus) testing. Routine prenatal testing includes screening for HIV, unless you choose not to have this test. HOME CARE INSTRUCTIONS  Medicines  Follow your health care provider's instructions regarding medicine use. Specific medicines may be either safe or unsafe to take during pregnancy.  Take your prenatal vitamins as directed.  If you develop constipation, try taking a stool softener if your health care provider approves. Diet  Eat regular, well-balanced meals. Choose a variety of foods, such as meat or vegetable-based protein, fish, milk and low-fat dairy products, vegetables, fruits, and whole grain breads and cereals. Your health care provider will help you determine the amount of weight gain that is  right for you.  Avoid raw meat and uncooked cheese. These carry germs that can cause birth defects in the baby.  Eating four or five small meals rather than three large meals a day may help relieve nausea and vomiting. If you start to feel nauseous, eating a few soda crackers can be helpful. Drinking liquids between meals instead of during meals also seems to help nausea and vomiting.  If you develop constipation, eat more high-fiber foods, such as fresh vegetables or fruit and whole grains. Drink enough fluids to keep your urine clear or pale yellow. Activity and Exercise  Exercise only as directed by your health care provider. Exercising will help you:  Control your weight.  Stay in shape.  Be prepared for labor and delivery.  Experiencing pain or cramping in the lower abdomen or low back is a good sign that you should stop exercising. Check with your health care provider before continuing normal exercises.  Try to avoid standing for long periods of time. Move your legs often if you must stand in one place for a long time.  Avoid heavy lifting.  Wear low-heeled shoes, and practice good posture.  You may continue to have sex unless your health care provider directs you otherwise. Relief of Pain or Discomfort  Wear a good support bra for breast tenderness.   Take warm sitz baths to soothe any pain or discomfort caused by hemorrhoids. Use hemorrhoid cream if your health care provider approves.   Rest with your legs elevated if you have leg cramps or low back pain.  If you develop varicose veins in your legs, wear support hose. Elevate your feet for 15 minutes, 3-4 times a day. Limit salt in your diet. Prenatal Care  Schedule your prenatal visits by the twelfth week of pregnancy. They are usually scheduled monthly at first, then more often in the last 2 months before delivery.  Write down your questions. Take them to your prenatal visits.  Keep all your prenatal visits as  directed by your health care provider. Safety  Wear your seat belt at all  times when driving.  Make a list of emergency phone numbers, including numbers for family, friends, the hospital, and police and fire departments. General Tips  Ask your health care provider for a referral to a local prenatal education class. Begin classes no later than at the beginning of month 6 of your pregnancy.  Ask for help if you have counseling or nutritional needs during pregnancy. Your health care provider can offer advice or refer you to specialists for help with various needs.  Do not use hot tubs, steam rooms, or saunas.  Do not douche or use tampons or scented sanitary pads.  Do not cross your legs for long periods of time.  Avoid cat litter boxes and soil used by cats. These carry germs that can cause birth defects in the baby and possibly loss of the fetus by miscarriage or stillbirth.  Avoid all smoking, herbs, alcohol, and medicines not prescribed by your health care provider. Chemicals in these affect the formation and growth of the baby.  Do not use any tobacco products, including cigarettes, chewing tobacco, and electronic cigarettes. If you need help quitting, ask your health care provider. You may receive counseling support and other resources to help you quit.  Schedule a dentist appointment. At home, brush your teeth with a soft toothbrush and be gentle when you floss. SEEK MEDICAL CARE IF:   You have dizziness.  You have mild pelvic cramps, pelvic pressure, or nagging pain in the abdominal area.  You have persistent nausea, vomiting, or diarrhea.  You have a bad smelling vaginal discharge.  You have pain with urination.  You notice increased swelling in your face, hands, legs, or ankles. SEEK IMMEDIATE MEDICAL CARE IF:   You have a fever.  You are leaking fluid from your vagina.  You have spotting or bleeding from your vagina.  You have severe abdominal cramping or  pain.  You have rapid weight gain or loss.  You vomit blood or material that looks like coffee grounds.  You are exposed to [place] measles and have never had them.  You are exposed to fifth disease or chickenpox.  You develop a severe headache.  You have shortness of breath.  You have any kind of trauma, such as from a fall or a car accident.   This information is not intended to replace advice given to you by your health care provider. Make sure you discuss any questions you have with your health care provider.   Document Released: 07/24/2001 Document Revised: 08/20/2014 Document Reviewed: 06/09/2013 Elsevier Interactive Patient Education [employer].

## 2016-06-08 LAB — GC/CHLAMYDIA PROBE AMP (~~LOC~~) NOT AT ARMC
CHLAMYDIA, DNA PROBE: NEGATIVE
NEISSERIA GONORRHEA: NEGATIVE

## 2016-06-08 LAB — HIV ANTIBODY (ROUTINE TESTING W REFLEX): HIV Screen 4th Generation wRfx: NONREACTIVE

## 2016-06-29 DIAGNOSIS — F1190 Opioid use, unspecified, uncomplicated: Secondary | ICD-10-CM | POA: Insufficient documentation

## 2016-06-29 DIAGNOSIS — Z8759 Personal history of other complications of pregnancy, childbirth and the puerperium: Secondary | ICD-10-CM | POA: Insufficient documentation

## 2016-09-13 ENCOUNTER — Inpatient Hospital Stay (HOSPITAL_COMMUNITY)
Admission: AD | Admit: 2016-09-13 | Discharge: 2016-09-13 | Disposition: A | Discharge status: Home / Self Care | Payer: Medicare Other | Source: Ambulatory Visit | Attending: Obstetrics and Gynecology | Admitting: Obstetrics and Gynecology

## 2016-09-13 NOTE — MAU Note (Signed)
NOT IN LOBBY

## 2016-09-15 ENCOUNTER — Emergency Department (HOSPITAL_COMMUNITY)
Admission: EM | Admit: 2016-09-15 | Discharge: 2016-09-15 | Disposition: A | Discharge status: Home / Self Care | Payer: Medicare Other | Attending: Emergency Medicine | Admitting: Emergency Medicine

## 2016-09-15 ENCOUNTER — Encounter (HOSPITAL_COMMUNITY): Payer: Self-pay | Admitting: Emergency Medicine

## 2016-09-15 DIAGNOSIS — R102 Pelvic and perineal pain: Secondary | ICD-10-CM | POA: Insufficient documentation

## 2016-09-15 DIAGNOSIS — Z3A26 26 weeks gestation of pregnancy: Secondary | ICD-10-CM | POA: Diagnosis not present

## 2016-09-15 DIAGNOSIS — R519 Headache, unspecified: Secondary | ICD-10-CM

## 2016-09-15 DIAGNOSIS — O9989 Other specified diseases and conditions complicating pregnancy, childbirth and the puerperium: Secondary | ICD-10-CM | POA: Insufficient documentation

## 2016-09-15 DIAGNOSIS — Z7982 Long term (current) use of aspirin: Secondary | ICD-10-CM | POA: Diagnosis not present

## 2016-09-15 DIAGNOSIS — Z87891 Personal history of nicotine dependence: Secondary | ICD-10-CM | POA: Insufficient documentation

## 2016-09-15 DIAGNOSIS — Z79899 Other long term (current) drug therapy: Secondary | ICD-10-CM | POA: Insufficient documentation

## 2016-09-15 DIAGNOSIS — R51 Headache: Secondary | ICD-10-CM | POA: Insufficient documentation

## 2016-09-15 DIAGNOSIS — I10 Essential (primary) hypertension: Secondary | ICD-10-CM | POA: Insufficient documentation

## 2016-09-15 DIAGNOSIS — O26892 Other specified pregnancy related conditions, second trimester: Secondary | ICD-10-CM | POA: Insufficient documentation

## 2016-09-15 LAB — URINALYSIS, ROUTINE W REFLEX MICROSCOPIC
BILIRUBIN URINE: NEGATIVE
GLUCOSE, UA: NEGATIVE mg/dL
KETONES UR: NEGATIVE mg/dL
Nitrite: NEGATIVE
PROTEIN: NEGATIVE mg/dL
Specific Gravity, Urine: 1.015 (ref 1.005–1.030)
pH: 5 (ref 5.0–8.0)

## 2016-09-15 NOTE — ED Notes (Signed)
Patient given sprite.

## 2016-09-15 NOTE — ED Notes (Signed)
Patient tolerating PO fluids 

## 2016-09-15 NOTE — Progress Notes (Signed)
Pt's hx given to Dr. Roselie Awkward. Pt is OB cleared and can be d/c home. Follow up with her OB. [facility] ED staff notified.

## 2016-09-15 NOTE — ED Triage Notes (Addendum)
Pt complaint of ongoing constant headache and intermittent lower abdominal cramping for 2 days. Seen for same of Thursday but left related to wait time. Pt verbalizing some spotting on Thursday night but none since. Pt is [redacted] weeks pregnant. Pt OBGYN is [facility] in [place].

## 2016-09-15 NOTE — Progress Notes (Signed)
Pt is a [id number]. Says she has had 4 C/S., 1 classical, a tubal ligation, and a tubal reversal. Says all of her babies were delivered before term. She has a hx of aC/S with a classical incision at 26 weeks because she had preeclampsia. She says that she was delivered 3 weeks Crystal Mora with her last baby at ? 37 weeks because she had eclampsia. She gets her care at [facility]. Says she has a Complete previa complicating this pregnancy. She is here today because she is feeling pressure that comes and goes. Also she is complaining of a slight headache. Denies blurred vision, spots before her eyes, or epigastric pain. No vaginal bleeding. She did say she noticed a pink-tinged d/c when she wiped.  FHR 144 BPM by doppler. Will assess for UC's. So far none palpated.BP 122/75.

## 2016-09-15 NOTE — ED Notes (Addendum)
OB Rapid Response called by Dianna Limbo.

## 2016-09-16 LAB — URINE CULTURE

## 2016-09-20 NOTE — ED Provider Notes (Signed)
[facility] DEPT Provider Note   CSN: [id number] Arrival date & time: 09/15/16  0086     History   Chief Complaint Chief Complaint  Patient presents with  . Abdominal Cramping  . Headache    HPI Crystal Mora is a 41 y.o. female.  HPI  Pt is a G14P8 with hx of multiple c-sections, a tubal ligation with subsequent reversal who is [redacted] weeks pregnant comes in to the ER with headaches and lower abdominal pain During my evaluation, pt reported that the headache is not her main concern. She has a generalized headache for the past few days. Pt has no associated nausea, vomiting, seizures, loss of consciousness or new visual complains, weakness, numbness, dizziness or gait instability. Pt hasnt taken any meds for the headache. No family hx of brain AN in the family.   Pt reports that her abd pain started this morning. Pain is intermittent, but cramping and she had some spotting with it.  There is feeling of pressure that comes and goes. Pt has no back pain. Pt denies intercourse or any trauma.  Past Medical History:  Diagnosis Date  . Anxiety   . Bipolar 1 disorder ([place])   . Depression   . Hypertension    states she has not took BP medication for 2 years  . Insomnia   . 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 Thyroid disease     There are no active problems to display for this patient.   Past Surgical History:  Procedure Laterality Date  . CESAREAN SECTION    . THYROID SURGERY    . TUBAL LIGATION    . tubal reversal Bilateral     OB History    Gravida Para Term Preterm AB Living   '14 8 3 5 3 8   '$ SAB TAB Ectopic Multiple Live Births   1 2 0 1 8       Home Medications    Prior to Admission medications   Medication Sig Start Date End Date Taking? Authorizing Provider  aspirin EC 81 MG tablet Take 81 mg by mouth daily.   Yes Historical Provider, MD  Aspirin-Salicylamide-Caffeine (BC HEADACHE POWDER PO) Take 1 Package by mouth daily as needed (headache).   Yes  Historical Provider, MD  HYDROcodone-acetaminophen (NORCO) 7.5-325 MG tablet Take 1 tablet by mouth 3 (three) times daily. 08/22/16  Yes Historical Provider, MD  cetirizine (ZYRTEC ALLERGY) 10 MG tablet Take 1 tablet (10 mg total) by mouth daily. Patient not taking: Reported on 09/15/2016 03/01/16   Montine Circle, PA-C  diphenhydrAMINE (BENADRYL) 25 mg capsule Take 1-2 capsules (25-50 mg total) by mouth every 6 (six) hours as needed. Patient not taking: Reported on 09/15/2016 03/20/16   Delice Bison Ward, DO  docusate sodium (COLACE) 100 MG capsule Take 1 capsule (100 mg total) by mouth every 12 (twelve) hours. Patient not taking: Reported on 09/15/2016 03/20/16   Delice Bison Ward, DO  famotidine (PEPCID) 20 MG tablet Take 1 tablet (20 mg total) by mouth 2 (two) times daily. Patient not taking: Reported on 09/15/2016 03/05/16   Delsa Grana, PA-C  triamcinolone cream (KENALOG) 0.1 % Apply 1 application topically 2 (two) times daily. Patient not taking: Reported on 09/15/2016 03/20/16   Delice Bison Ward, DO    Family History Family History  Problem Relation Age of Onset  . Depression Mother   . Mental illness Mother   . Asthma Mother   . COPD Mother   . Diabetes  Mother   . Mental illness Brother   . Mental illness Daughter   . Diabetes Maternal Aunt   . Mental illness Maternal Aunt   . Depression Maternal Aunt   . Diabetes Maternal Grandmother     Social History Social History  Substance Use Topics  . Smoking status: Former Smoker    Packs/day: 0.50    Types: Cigarettes  . Smokeless tobacco: Current User  . Alcohol use Yes     Comment: Rare     Allergies   Biaxin [clarithromycin]; Penicillins; Percocet [oxycodone-acetaminophen]; and Vicodin [hydrocodone-acetaminophen]   Review of Systems Review of Systems  ROS 10 Systems reviewed and are negative for acute change except as noted in the HPI.     Physical Exam Updated Vital Signs BP 126/83   Pulse 90   Temp 98.2 F (36.8 C) (Oral)    Resp 16   Ht '5\' 6"'$  (1.676 m)   Wt 199 lb (90.3 kg)   LMP 04/21/2016   SpO2 100%   BMI 32.12 kg/m   Physical Exam  Constitutional: She is oriented to person, place, and time. She appears well-developed and well-nourished.  HENT:  Head: Normocephalic and atraumatic.  Eyes: EOM are normal. Pupils are equal, round, and reactive to light.  Neck: Neck supple.  Cardiovascular: Normal rate, regular rhythm and normal heart sounds.   Pulmonary/Chest: Effort normal. No respiratory distress.  Abdominal: Soft. She exhibits no distension. There is tenderness. There is no rebound and no guarding. No hernia.  Tenderness over the suprapubic region  Neurological: She is alert and oriented to person, place, and time. No cranial nerve deficit. Coordination normal.  Skin: Skin is warm and dry.  Nursing note and vitals reviewed.    ED Treatments / Results  Labs (Mora labs ordered are listed, but only abnormal results are displayed) Labs Reviewed  URINE CULTURE - Abnormal; Notable for the following:       Result Value   Culture MULTIPLE SPECIES PRESENT, SUGGEST RECOLLECTION (*)    Mora other components within normal limits  URINALYSIS, ROUTINE W REFLEX MICROSCOPIC - Abnormal; Notable for the following:    APPearance CLOUDY (*)    Hgb urine dipstick SMALL (*)    Leukocytes, UA TRACE (*)    Bacteria, UA MANY (*)    Squamous Epithelial / LPF 6-30 (*)    Mora other components within normal limits    EKG  EKG Interpretation None       Radiology No results found.  Procedures Procedures (including critical care time)  Medications Ordered in ED Medications - No data to display   Initial Impression / Assessment and Plan / ED Course  I have reviewed the triage vital signs and the nursing notes.  Pertinent labs & imaging results that were available during my care of the patient were reviewed by me and considered in my medical decision making (see chart for details).     Pt comes in with  cc of abd pain - that is her main complain. Pt is pregnant, she has had multipe C-sections, she has had pre-eclampsia, she is G14 and at age 88 - so high risk pregnancy. Pt is having some spotting with her abd pain. Pt is not having intercourse, as she has been advised not to and has no vaginal discharge, dysuria, hematuria. Pt has no back pain. Her pain is not consistent with labor type pain, which is reassuring. We called rapid OB to assess the patient. FHT are reassuring and there  is no labor type contraction. Pt's exam not consistent with biliary colic, ureteral colic. Pt actually has no upper quadrant tenderness at Mora. Abd exam reveal no peritoneal signs. Pt has no uti like symptoms, but we will get UA and cultures. Pt and I discussed of risk for STDs, but she reported that she is not having intercourse as per the advise of her OB.  Strict ER return precautions have been discussed, and patient is agreeing with the plan and is comfortable with the workup done and the recommendations from the ER.  [facility] and she is appreciative of that info.   Final Clinical Impressions(s) / ED Diagnoses   Final diagnoses:  Pelvic pain in female  Bad headache    New Prescriptions Discharge Medication List as of 09/15/2016  1:54 PM       Varney Biles, MD 09/20/16 1655

## 2016-10-19 ENCOUNTER — Encounter (HOSPITAL_COMMUNITY): Payer: Self-pay | Admitting: *Deleted

## 2016-10-19 ENCOUNTER — Inpatient Hospital Stay (HOSPITAL_COMMUNITY)
Admission: AD | Admit: 2016-10-19 | Discharge: 2016-10-19 | Disposition: A | Discharge status: Home / Self Care | Payer: Medicare Other | Source: Ambulatory Visit | Attending: Obstetrics & Gynecology | Admitting: Obstetrics & Gynecology

## 2016-10-19 DIAGNOSIS — Z7982 Long term (current) use of aspirin: Secondary | ICD-10-CM | POA: Diagnosis not present

## 2016-10-19 DIAGNOSIS — Z885 Allergy status to narcotic agent status: Secondary | ICD-10-CM | POA: Insufficient documentation

## 2016-10-19 DIAGNOSIS — Z87891 Personal history of nicotine dependence: Secondary | ICD-10-CM | POA: Diagnosis not present

## 2016-10-19 DIAGNOSIS — R51 Headache: Secondary | ICD-10-CM | POA: Insufficient documentation

## 2016-10-19 DIAGNOSIS — O99342 Other mental disorders complicating pregnancy, second trimester: Secondary | ICD-10-CM | POA: Diagnosis not present

## 2016-10-19 DIAGNOSIS — O09522 Supervision of elderly multigravida, second trimester: Secondary | ICD-10-CM | POA: Insufficient documentation

## 2016-10-19 DIAGNOSIS — Z833 Family history of diabetes mellitus: Secondary | ICD-10-CM | POA: Insufficient documentation

## 2016-10-19 DIAGNOSIS — Z818 Family history of other mental and behavioral disorders: Secondary | ICD-10-CM | POA: Insufficient documentation

## 2016-10-19 DIAGNOSIS — Z880 Allergy status to penicillin: Secondary | ICD-10-CM | POA: Insufficient documentation

## 2016-10-19 DIAGNOSIS — O26892 Other specified pregnancy related conditions, second trimester: Secondary | ICD-10-CM | POA: Diagnosis not present

## 2016-10-19 DIAGNOSIS — R519 Headache, unspecified: Secondary | ICD-10-CM

## 2016-10-19 DIAGNOSIS — Z3A25 25 weeks gestation of pregnancy: Secondary | ICD-10-CM | POA: Diagnosis not present

## 2016-10-19 DIAGNOSIS — O9989 Other specified diseases and conditions complicating pregnancy, childbirth and the puerperium: Secondary | ICD-10-CM

## 2016-10-19 DIAGNOSIS — Z825 Family history of asthma and other chronic lower respiratory diseases: Secondary | ICD-10-CM | POA: Diagnosis not present

## 2016-10-19 DIAGNOSIS — Z79899 Other long term (current) drug therapy: Secondary | ICD-10-CM | POA: Insufficient documentation

## 2016-10-19 DIAGNOSIS — F319 Bipolar disorder, unspecified: Secondary | ICD-10-CM | POA: Diagnosis not present

## 2016-10-19 DIAGNOSIS — F419 Anxiety disorder, unspecified: Secondary | ICD-10-CM | POA: Diagnosis not present

## 2016-10-19 LAB — CBC
HCT: 36.8 % (ref 36.0–46.0)
Hemoglobin: 12 g/dL (ref 12.0–15.0)
MCH: 26.4 pg (ref 26.0–34.0)
MCHC: 32.6 g/dL (ref 30.0–36.0)
MCV: 80.9 fL (ref 78.0–100.0)
PLATELETS: 236 10*3/uL (ref 150–400)
RBC: 4.55 MIL/uL (ref 3.87–5.11)
RDW: 15.6 % — ABNORMAL HIGH (ref 11.5–15.5)
WBC: 9.1 10*3/uL (ref 4.0–10.5)

## 2016-10-19 LAB — COMPREHENSIVE METABOLIC PANEL
ALK PHOS: 74 U/L (ref 38–126)
ALT: 9 U/L — AB (ref 14–54)
AST: 11 U/L — ABNORMAL LOW (ref 15–41)
Albumin: 3.1 g/dL — ABNORMAL LOW (ref 3.5–5.0)
Anion gap: 11 (ref 5–15)
BILIRUBIN TOTAL: 0.5 mg/dL (ref 0.3–1.2)
BUN: 9 mg/dL (ref 6–20)
CALCIUM: 8.2 mg/dL — AB (ref 8.9–10.3)
CHLORIDE: 100 mmol/L — AB (ref 101–111)
CO2: 22 mmol/L (ref 22–32)
CREATININE: 0.58 mg/dL (ref 0.44–1.00)
Glucose, Bld: 115 mg/dL — ABNORMAL HIGH (ref 65–99)
Potassium: 3.2 mmol/L — ABNORMAL LOW (ref 3.5–5.1)
Sodium: 133 mmol/L — ABNORMAL LOW (ref 135–145)
TOTAL PROTEIN: 6.4 g/dL — AB (ref 6.5–8.1)

## 2016-10-19 LAB — URINALYSIS, ROUTINE W REFLEX MICROSCOPIC
BILIRUBIN URINE: NEGATIVE
Glucose, UA: NEGATIVE mg/dL
Hgb urine dipstick: NEGATIVE
Ketones, ur: 5 mg/dL — AB
LEUKOCYTES UA: NEGATIVE
NITRITE: NEGATIVE
PH: 6 (ref 5.0–8.0)
Protein, ur: 30 mg/dL — AB
SPECIFIC GRAVITY, URINE: 1.017 (ref 1.005–1.030)

## 2016-10-19 LAB — PROTEIN / CREATININE RATIO, URINE
Creatinine, Urine: 161 mg/dL
Protein Creatinine Ratio: 0.22 mg/mg{Cre} — ABNORMAL HIGH (ref 0.00–0.15)
TOTAL PROTEIN, URINE: 36 mg/dL

## 2016-10-19 MED ORDER — METOCLOPRAMIDE HCL 5 MG/ML IJ SOLN
10.0000 mg | Freq: Once | INTRAMUSCULAR | Status: AC
  Administered 2016-10-19: 10 mg via INTRAVENOUS
  Filled 2016-10-19: qty 2

## 2016-10-19 MED ORDER — CYCLOBENZAPRINE HCL 10 MG PO TABS: 10.0000 mg | ORAL_TABLET | Freq: Three times a day (TID) | ORAL | 1 refills | Status: DC | PRN

## 2016-10-19 MED ORDER — LACTATED RINGERS IV BOLUS (SEPSIS)
500.0000 mL | Freq: Once | INTRAVENOUS | Status: AC
  Administered 2016-10-19: 20:00:00 via INTRAVENOUS

## 2016-10-19 MED ORDER — DEXAMETHASONE SODIUM PHOSPHATE 10 MG/ML IJ SOLN
10.0000 mg | Freq: Once | INTRAMUSCULAR | Status: AC
  Administered 2016-10-19: 10 mg via INTRAVENOUS
  Filled 2016-10-19: qty 1

## 2016-10-19 NOTE — MAU Note (Signed)
Had headache for 2 days. Hx preeclampsia and eclampsia. Has had protein in urine since beginning of pregnancy. Took BC powder about 1600 and did not help. Denies any visual changes and no epigastric pain.

## 2016-10-19 NOTE — Discharge Instructions (Signed)
Recurrent Migraine Headache A migraine headache is very bad, throbbing pain that is usually on one side of your head. Recurrent migraines keep coming back (recurring). Talk with your doctor about what things may bring on (trigger) your migraine headaches. Follow these instructions at home: Medicines   Take over-the-counter and prescription medicines only as told by your doctor.  Do not drive or use heavy machinery while taking prescription pain medicine. Lifestyle   Do not use any products that contain nicotine or tobacco, such as cigarettes and e-cigarettes. If you need help quitting, ask your doctor.  Limit alcohol intake to no more than 1 drink a day for nonpregnant women and 2 drinks a day for men. One drink equals 12 oz of beer, 5 oz of wine, or 1 oz of hard liquor.  Get 7-9 hours of sleep each night.  Lessen any stress in your life. Ask your doctor about ways to lower your stress.  Stay at a healthy weight. Talk with your doctor if you need help losing weight.  Get regular exercise. General instructions   Keep a journal to find out if certain things bring on migraine headaches. For example, write down:  What you eat and drink.  How much sleep you get.  Any change to your diet or medicines.  Lie down in a dark, quiet room when you have a migraine.  Try placing a cool towel over your head when you have a migraine.  Keep lights dim if bright lights bother you or make your migraines worse.  Keep all follow-up visits as told by your doctor. This is important. Contact a doctor if:  Medicine does not help your migraines.  Your pain keeps coming back.  You have a fever.  You have weight loss without trying. Get help right away if:  Your migraine becomes really bad and medicine does not help.  You have a stiff neck.  You have trouble seeing.  Your muscles are weak or you lose control of your muscles.  You lose your balance or have trouble walking.  You feel  like you will pass out (faint) or you pass out.  You have really bad symptoms that are different than your first symptoms.  You start having sudden, very bad headaches that last for one second or less, like a thunderclap. Summary  A migraine headache is very bad, throbbing pain that is usually on one side of your head.  Talk with your doctor about what things may bring on (trigger) your migraine headaches.  Take over-the-counter and prescription medicines only as told by your doctor.  Lie down in a dark, quiet room when you have a migraine.  Keep a journal about what you eat and drink, how much sleep you get, and any changes to your medicines. This can help you find out if certain things make you have migraine headaches. This information is not intended to replace advice given to you by your health care provider. Make sure you discuss any questions you have with your health care provider. Document Released: 05/08/2008 Document Revised: 06/22/2016 Document Reviewed: 06/22/2016 Elsevier Interactive Patient Education  2017 [employer].

## 2016-10-19 NOTE — MAU Provider Note (Signed)
Patient Crystal Mora is a 41 year old [id number] at 25 weeks and 2 days complaining of a headache. She has a history of preeclampsia and subsequent pre-term delivery due to preeclampsia. She is here because she is worried that she might be develioping preeclampsia.  Patient is a patient at [facility] ([place]) History     CSN: [id number]  Arrival date and time: 10/19/16 1859   None     Chief Complaint  Patient presents with  . Headache   Headache   This is a new problem. The current episode started yesterday. The problem occurs intermittently. The problem has been unchanged. The pain is located in the left unilateral region. The pain radiates to the left neck. The pain quality is similar to prior headaches. The quality of the pain is described as aching. The pain is at a severity of 8/10. Pertinent negatives include no abdominal pain, fever, nausea, photophobia, visual change or vomiting. Nothing aggravates the symptoms. Treatments tried: she has tried [facility] powder and it didn't work this time.    OB History    Gravida Para Term Preterm AB Living   '14 8 3 5 3 8   '$ SAB TAB Ectopic Multiple Live Births   1 2 0 1 8      Past Medical History:  Diagnosis Date  . Anxiety   . Bipolar 1 disorder ([place])   . Depression   . Hypertension    states she has not took BP medication for 2 years  . Insomnia   . 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 Thyroid disease     Past Surgical History:  Procedure Laterality Date  . CESAREAN SECTION    . THYROID SURGERY    . TUBAL LIGATION    . tubal reversal Bilateral     Family History  Problem Relation Age of Onset  . Depression Mother   . Mental illness Mother   . Asthma Mother   . COPD Mother   . Diabetes Mother   . Mental illness Brother   . Mental illness Daughter   . Diabetes Maternal Aunt   . Mental illness Maternal Aunt   . Depression Maternal Aunt   . Diabetes Maternal Grandmother     Social History  Substance  Use Topics  . Smoking status: Former Smoker    Packs/day: 0.50    Types: Cigarettes  . Smokeless tobacco: Current User  . Alcohol use Yes     Comment: Rare    Allergies:  Allergies  Allergen Reactions  . Biaxin [Clarithromycin]   . Penicillins Hives, Itching and Swelling    Has patient had a PCN reaction causing immediate rash, facial/tongue/throat swelling, SOB or lightheadedness with hypotension: No Has patient had a PCN reaction causing severe rash involving mucus membranes or skin necrosis: No Has patient had a PCN reaction that required hospitalization No Has patient had a PCN reaction occurring within the last 10 years: Yes If all of the above answers are "NO", then may proceed with Cephalosporin use.   Marland Kitchen Percocet [Oxycodone-Acetaminophen] Hives and Itching  . Vicodin [Hydrocodone-Acetaminophen] Hives and Itching    Pt states tolerated with benadryl    Prescriptions Prior to Admission  Medication Sig Dispense Refill Last Dose  . aspirin EC 81 MG tablet Take 81 mg by mouth daily.   Past Week at Unknown time  . Aspirin-Salicylamide-Caffeine (BC HEADACHE POWDER PO) Take 1 Package by mouth daily as needed (headache).   10/19/2016 at  Unknown time  . HYDROcodone-acetaminophen (NORCO) 7.5-325 MG tablet Take 1 tablet by mouth 3 (three) times daily as needed for severe pain.   0 Past Week at Unknown time  . ranitidine (ZANTAC) 150 MG capsule Take 150 mg by mouth 2 (two) times daily.   10/18/2016 at Unknown time  . sertraline (ZOLOFT) 25 MG tablet Take 25 mg by mouth at bedtime.  1 Past Week at Unknown time  . cetirizine (ZYRTEC ALLERGY) 10 MG tablet Take 1 tablet (10 mg total) by mouth daily. (Patient not taking: Reported on 09/15/2016) 30 tablet 0 Not Taking at Unknown time  . diphenhydrAMINE (BENADRYL) 25 mg capsule Take 1-2 capsules (25-50 mg total) by mouth every 6 (six) hours as needed. (Patient not taking: Reported on 09/15/2016) 60 capsule 0 Not Taking at Unknown time  . famotidine  (PEPCID) 20 MG tablet Take 1 tablet (20 mg total) by mouth 2 (two) times daily. (Patient not taking: Reported on 09/15/2016) 30 tablet 0 Not Taking at Unknown time    Review of Systems  Constitutional: Negative for fever.  Eyes: Negative for photophobia.  Respiratory: Negative.   Cardiovascular: Negative.   Gastrointestinal: Negative for abdominal pain, nausea and vomiting.  Genitourinary: Negative.   Musculoskeletal: Negative.   Neurological: Positive for headaches.       Headache is left-sided by her ear   Physical Exam   Blood pressure 111/59, pulse 91, temperature 98.7 F (37.1 C), resp. rate 18, height '5\' 6"'$  (1.676 m), weight 91.5 kg (201 lb 12.8 oz), last menstrual period 04/21/2016, unknown if currently breastfeeding.  Physical Exam  Constitutional: She appears well-developed.  HENT:  Head: Normocephalic.  Neck: Normal range of motion.  Respiratory: Effort normal. No respiratory distress. She has no wheezes. She has no rales. She exhibits no tenderness.  GI: Soft. Bowel sounds are normal.  Musculoskeletal: Normal range of motion.  Neurological: She is alert.  Skin: Skin is warm and dry.  Psychiatric: She has a normal mood and affect.  Patient is resting in bed, very animated when talking. Does not appear in any distress or discomfort.   MAU Course  Procedures  MDM -Decadron and reglan for Headache, pain now relieved; LR infusion -Pre-eclampsia work up (CBC, CMP, UPC)-normal  -Patient had slightly elevated systolic blood pressures in triage due to pain but blood pressures have come down once she has received pain medication  -NST: 145 bpm, present accelerations, negative decelerations, moderate variability. No contractions  Assessment and Plan   1. Headache disorder    2. Patient stable for discharge with RX for Flexeril. Encouraged patient to make an appointment with her neurologist to discuss headache management.  3. Reviewed when to return to the MAU (bleeding,  leaking of fluid, HA that worsenes, vision changes) 4. Recommended that patient purchase a blood pressure cuff and begin checking her blood pressure once in the morning and once in the day. Also recommended taht patient call [facility] and go in for a blood pressure check this week. Patient verbalized undersatnding and has no questions.  Mervyn Skeeters Sarkis Rhines CNM 10/19/2016, 9:20 PM

## 2017-01-02 DIAGNOSIS — Z8759 Personal history of other complications of pregnancy, childbirth and the puerperium: Secondary | ICD-10-CM | POA: Insufficient documentation

## 2017-04-12 ENCOUNTER — Encounter (HOSPITAL_COMMUNITY): Payer: Self-pay

## 2017-06-24 ENCOUNTER — Encounter (HOSPITAL_COMMUNITY): Payer: Self-pay

## 2017-06-24 ENCOUNTER — Other Ambulatory Visit: Payer: Self-pay

## 2017-06-24 ENCOUNTER — Emergency Department (HOSPITAL_COMMUNITY)
Admission: EM | Admit: 2017-06-24 | Discharge: 2017-06-24 | Disposition: A | Discharge status: Home / Self Care | Payer: Medicare Other | Attending: Emergency Medicine | Admitting: Emergency Medicine

## 2017-06-24 DIAGNOSIS — F17290 Nicotine dependence, other tobacco product, uncomplicated: Secondary | ICD-10-CM | POA: Diagnosis not present

## 2017-06-24 DIAGNOSIS — I10 Essential (primary) hypertension: Secondary | ICD-10-CM | POA: Diagnosis not present

## 2017-06-24 DIAGNOSIS — Z7982 Long term (current) use of aspirin: Secondary | ICD-10-CM | POA: Diagnosis not present

## 2017-06-24 DIAGNOSIS — Z79899 Other long term (current) drug therapy: Secondary | ICD-10-CM | POA: Diagnosis not present

## 2017-06-24 DIAGNOSIS — M7918 Myalgia, other site: Secondary | ICD-10-CM | POA: Diagnosis not present

## 2017-06-24 NOTE — Discharge Instructions (Signed)
Please read attached information. If you experience any new or worsening signs or symptoms please return to the emergency room for evaluation. Please follow-up with your primary care provider or specialist as discussed. Please use medication prescribed only as directed and discontinue taking if you have any concerning signs or symptoms.   °
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
## 2017-07-18 DIAGNOSIS — H903 Sensorineural hearing loss, bilateral: Secondary | ICD-10-CM | POA: Insufficient documentation

## 2017-07-18 DIAGNOSIS — H93A2 Pulsatile tinnitus, left ear: Secondary | ICD-10-CM | POA: Insufficient documentation

## 2017-09-13 ENCOUNTER — Other Ambulatory Visit: Payer: Self-pay | Admitting: Specialist

## 2017-09-13 DIAGNOSIS — M5412 Radiculopathy, cervical region: Secondary | ICD-10-CM

## 2017-10-03 DIAGNOSIS — E611 Iron deficiency: Secondary | ICD-10-CM | POA: Insufficient documentation

## 2017-10-05 ENCOUNTER — Other Ambulatory Visit: Payer: Medicare Other

## 2017-11-25 DIAGNOSIS — N920 Excessive and frequent menstruation with regular cycle: Secondary | ICD-10-CM | POA: Insufficient documentation

## 2017-12-17 ENCOUNTER — Other Ambulatory Visit: Payer: Self-pay

## 2017-12-17 ENCOUNTER — Encounter (HOSPITAL_COMMUNITY): Payer: Self-pay

## 2017-12-17 ENCOUNTER — Ambulatory Visit (HOSPITAL_COMMUNITY)
Admission: EM | Admit: 2017-12-17 | Discharge: 2017-12-17 | Disposition: A | Discharge status: Home / Self Care | Payer: Medicare Other | Attending: Family Medicine | Admitting: Family Medicine

## 2017-12-17 DIAGNOSIS — M659 Synovitis and tenosynovitis, unspecified: Secondary | ICD-10-CM | POA: Diagnosis not present

## 2017-12-17 MED ORDER — PREDNISONE 20 MG PO TABS: ORAL_TABLET | ORAL | 1 refills | Status: DC

## 2017-12-17 NOTE — ED Triage Notes (Signed)
Pt presents with complaints of left finger swelling x 7 days. Denies injury.

## 2017-12-17 NOTE — ED Provider Notes (Signed)
[place]   [id number] 12/17/17 Arrival Time: 66   SUBJECTIVE:  Crystal Mora is a 42 y.o. female who presents to the urgent care with complaint of left finger swelling x 7 days. Denies injury.  Patient takes care of her 7 daughters and does not work outside the house.  Patient had no puncture wound to the hand, redness, fever, or other finger problem.  Patient says that she has trouble flexing the left middle finger and extending it fully.  Pain and some swelling inhibits the range of motion at the MCP joint    Past Medical History:  Diagnosis Date  . Anxiety   . Bipolar 1 disorder ([place])   . Depression   . Hypertension    states she has not took BP medication for 2 years  . Insomnia   . 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 Thyroid disease    Family History  Problem Relation Age of Onset  . Depression Mother   . Mental illness Mother   . Asthma Mother   . COPD Mother   . Diabetes Mother   . Mental illness Brother   . Mental illness Daughter   . Diabetes Maternal Aunt   . Mental illness Maternal Aunt   . Depression Maternal Aunt   . Diabetes Maternal Grandmother    Social History   Socioeconomic History  . Marital status: Divorced    Spouse name: Not on file  . Number of children: Not on file  . Years of education: Not on file  . Highest education level: Not on file  Occupational History  . Not on file  Social Needs  . Financial resource strain: Not on file  . Food insecurity:    Worry: Not on file    Inability: Not on file  . Transportation needs:    Medical: Not on file    Non-medical: Not on file  Tobacco Use  . Smoking status: Current Every Day Smoker    Packs/day: 0.50    Types: Cigars  . Smokeless tobacco: Current User  . Tobacco comment: 1 cigar daily  Substance and Sexual Activity  . Alcohol use: Yes    Comment: Rare  . Drug use: No  . Sexual activity: Yes    Birth control/protection: None  Lifestyle  . Physical  activity:    Days per week: Not on file    Minutes per session: Not on file  . Stress: Not on file  Relationships  . Social connections:    Talks on phone: Not on file    Gets together: Not on file    Attends religious service: Not on file    Active member of club or organization: Not on file    Attends meetings of clubs or organizations: Not on file    Relationship status: Not on file  . Intimate partner violence:    Fear of current or ex partner: Not on file    Emotionally abused: Not on file    Physically abused: Not on file    Forced sexual activity: Not on file  Other Topics Concern  . Not on file  Social History Narrative  . Not on file   Current Meds  Medication Sig  . aspirin EC 81 MG tablet Take 81 mg by mouth daily.  . Aspirin-Salicylamide-Caffeine (BC HEADACHE POWDER PO) Take 1 Package by mouth daily as needed (headache).  . cyclobenzaprine (FLEXERIL) 10 MG tablet Take 1 tablet (10 mg total) by  mouth every 8 (eight) hours as needed for muscle spasms.  . ranitidine (ZANTAC) 150 MG capsule Take 150 mg by mouth 2 (two) times daily.  . sertraline (ZOLOFT) 25 MG tablet Take 25 mg by mouth at bedtime.   Allergies  Allergen Reactions  . Biaxin [Clarithromycin]   . Penicillins Hives, Itching and Swelling    Has patient had a PCN reaction causing immediate rash, facial/tongue/throat swelling, SOB or lightheadedness with hypotension: No Has patient had a PCN reaction causing severe rash involving mucus membranes or skin necrosis: No Has patient had a PCN reaction that required hospitalization No Has patient had a PCN reaction occurring within the last 10 years: Yes If all of the above answers are "NO", then may proceed with Cephalosporin use.   Marland Kitchen Percocet [Oxycodone-Acetaminophen] Hives and Itching  . Vicodin [Hydrocodone-Acetaminophen] Hives and Itching    Pt states tolerated with benadryl      ROS: As per HPI, remainder of ROS negative.   OBJECTIVE:   Vitals:     12/17/17 1748 12/17/17 1749  BP: 139/89   Pulse: (!) 114   Resp: 18   Temp: 98.8 F (37.1 C)   SpO2: 98%   Weight:  191 lb (86.6 kg)     General appearance: alert; no distress Eyes: PERRL; EOMI; conjunctiva normal HENT: normocephalic; atraumatic; oral mucosa normal Neck: supple Back: no CVA tenderness Extremities: no cyanosis or edema; symmetrical with no gross deformities; Some swelling on the volar proximal middle finger of the left hand.  There is mild tenderness over the head of the middle finger metacarpal. Skin: warm and dry Neurologic: normal gait; grossly normal Psychological: alert and cooperative; normal mood and affect      Labs:  Results for orders placed or performed during the hospital encounter of 10/19/16  Urinalysis, Routine w reflex microscopic  Result Value Ref Range   Color, Urine YELLOW YELLOW   APPearance CLEAR CLEAR   Specific Gravity, Urine 1.017 1.005 - 1.030   pH 6.0 5.0 - 8.0   Glucose, UA NEGATIVE NEGATIVE mg/dL   Hgb urine dipstick NEGATIVE NEGATIVE   Bilirubin Urine NEGATIVE NEGATIVE   Ketones, ur 5 (A) NEGATIVE mg/dL   Protein, ur 30 (A) NEGATIVE mg/dL   Nitrite NEGATIVE NEGATIVE   Leukocytes, UA NEGATIVE NEGATIVE   RBC / HPF 0-5 0 - 5 RBC/hpf   WBC, UA 0-5 0 - 5 WBC/hpf   Bacteria, UA RARE (A) NONE SEEN   Squamous Epithelial / LPF 0-5 (A) NONE SEEN   Mucus PRESENT   CBC  Result Value Ref Range   WBC 9.1 4.0 - 10.5 K/uL   RBC 4.55 3.87 - 5.11 MIL/uL   Hemoglobin 12.0 12.0 - 15.0 g/dL   HCT 36.8 36.0 - 46.0 %   MCV 80.9 78.0 - 100.0 fL   MCH 26.4 26.0 - 34.0 pg   MCHC 32.6 30.0 - 36.0 g/dL   RDW 15.6 (H) 11.5 - 15.5 %   Platelets 236 150 - 400 K/uL  Comprehensive metabolic panel  Result Value Ref Range   Sodium 133 (L) 135 - 145 mmol/L   Potassium 3.2 (L) 3.5 - 5.1 mmol/L   Chloride 100 (L) 101 - 111 mmol/L   CO2 22 22 - 32 mmol/L   Glucose, Bld 115 (H) 65 - 99 mg/dL   BUN 9 6 - 20 mg/dL   Creatinine, Ser 0.58 0.44 -  1.00 mg/dL   Calcium 8.2 (L) 8.9 - 10.3 mg/dL   Total  Protein 6.4 (L) 6.5 - 8.1 g/dL   Albumin 3.1 (L) 3.5 - 5.0 g/dL   AST 11 (L) 15 - 41 U/L   ALT 9 (L) 14 - 54 U/L   Alkaline Phosphatase 74 38 - 126 U/L   Total Bilirubin 0.5 0.3 - 1.2 mg/dL   GFR calc non Af Amer >60 >60 mL/min   GFR calc Af Amer >60 >60 mL/min   Anion gap 11 5 - 15  Protein / creatinine ratio, urine  Result Value Ref Range   Creatinine, Urine 161.00 mg/dL   Total Protein, Urine 36 mg/dL   Protein Creatinine Ratio 0.22 (H) 0.00 - 0.15 mg/mg[Cre]    Labs Reviewed - No data to display  No results found.     ASSESSMENT & PLAN:  1. Tenosynovitis of finger     Meds ordered this encounter  Medications  . predniSONE (DELTASONE) 20 MG tablet    Sig: Two daily with food    Dispense:  6 tablet    Refill:  1    Reviewed expectations re: course of current medical issues. Questions answered. Outlined signs and symptoms indicating need for more acute intervention. Patient verbalized understanding. After Visit Summary given.    Procedures:      Robyn Haber, MD 12/17/17 4158

## 2018-01-23 ENCOUNTER — Ambulatory Visit (HOSPITAL_COMMUNITY)
Admission: EM | Admit: 2018-01-23 | Discharge: 2018-01-23 | Disposition: A | Discharge status: Home / Self Care | Payer: Medicare Other | Attending: Family Medicine | Admitting: Family Medicine

## 2018-01-23 ENCOUNTER — Encounter (HOSPITAL_COMMUNITY): Payer: Self-pay

## 2018-01-23 DIAGNOSIS — L02411 Cutaneous abscess of right axilla: Secondary | ICD-10-CM | POA: Diagnosis not present

## 2018-01-23 MED ORDER — CEPHALEXIN 500 MG PO CAPS: 500.0000 mg | ORAL_CAPSULE | Freq: Four times a day (QID) | ORAL | 0 refills | Status: AC

## 2018-01-23 NOTE — ED Provider Notes (Signed)
[place]    CSN: [id number] Arrival date & time: 01/23/18  1101     History   Chief Complaint Chief Complaint  Patient presents with  . Abscess    HPI Crystal Mora is a 42 y.o. female.   Crystal Mora presents with complaints of boil under her right axilla. Hx of HS, right axilla historically worse than left. Has consulted with general surgery in January of this year. Was then referred to dermatology, she is on long term use of bactrim. Still getting boils. Last was three weeks ago. Typically she can get them to open and drain on their own. Current boil present for the past week. She has been trying multiple home remedies for this yet it has still not drained. Very painful. No known fevers. Hx anxiety, bipolar, depression, htn.    ROS per HPI.      Past Medical History:  Diagnosis Date  . Anxiety   . Bipolar 1 disorder ([place])   . Depression   . Hypertension    states she has not took BP medication for 2 years  . Insomnia   . 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 Thyroid disease     There are no active problems to display for this patient.   Past Surgical History:  Procedure Laterality Date  . CESAREAN SECTION    . THYROID SURGERY    . TUBAL LIGATION    . tubal reversal Bilateral     OB History    Gravida  14   Para  8   Term  3   Preterm  5   AB  3   Living  8     SAB  1   TAB  2   Ectopic  0   Multiple  1   Live Births  8            Home Medications    Prior to Admission medications   Medication Sig Start Date End Date Taking? Authorizing Provider  aspirin EC 81 MG tablet Take 81 mg by mouth daily.    [provider]  Aspirin-Salicylamide-Caffeine (BC HEADACHE POWDER PO) Take 1 Package by mouth daily as needed (headache).    [provider]  cephALEXin (KEFLEX) 500 MG capsule Take 1 capsule (500 mg total) by mouth 4 (four) times daily for 10 days. 01/23/18 02/02/18  Zigmund Gottron, NP    cyclobenzaprine (FLEXERIL) 10 MG tablet Take 1 tablet (10 mg total) by mouth every 8 (eight) hours as needed for muscle spasms. 10/19/16   Starr Lake, CNM  cyclobenzaprine (FLEXERIL) 10 MG tablet Take 1 tablet (10 mg total) by mouth every 8 (eight) hours as needed for muscle spasms. 10/19/16   Starr Lake, CNM  predniSONE (DELTASONE) 20 MG tablet Two daily with food 12/17/17   Robyn Haber, MD  ranitidine (ZANTAC) 150 MG capsule Take 150 mg by mouth 2 (two) times daily.    [provider]  sertraline (ZOLOFT) 25 MG tablet Take 25 mg by mouth at bedtime. 09/16/16   [provider]    Family History Family History  Problem Relation Age of Onset  . Depression Mother   . Mental illness Mother   . Asthma Mother   . COPD Mother   . Diabetes Mother   . Mental illness Brother   . Mental illness Daughter   . Diabetes Maternal Aunt   . Mental illness Maternal Aunt   . Depression  Maternal Aunt   . Diabetes Maternal Grandmother     Social History Social History   Tobacco Use  . Smoking status: Current Every Day Smoker    Packs/day: 0.50    Types: Cigars  . Smokeless tobacco: Current User  . Tobacco comment: 1 cigar daily  Substance Use Topics  . Alcohol use: Yes    Comment: Rare  . Drug use: No     Allergies   Biaxin [clarithromycin]; Morphine and related; Penicillins; and Percocet [oxycodone-acetaminophen]   Review of Systems Review of Systems   Physical Exam Triage Vital Signs ED Triage Vitals [01/23/18 1147]  Enc Vitals Group     BP 126/71     Pulse Rate (!) 110     Resp 19     Temp 99 F (37.2 C)     Temp Source Oral     SpO2 100 %     Weight      Height      Head Circumference      Peak Flow      Pain Score 10     Pain Loc      Pain Edu?      Excl. in [place]?    No data found.  Updated Vital Signs BP 126/71   Pulse (!) 110   Temp 99 F (37.2 C) (Oral)   Resp 19   LMP 12/25/2017   SpO2 100%    Physical  Exam  Constitutional: She is oriented to person, place, and time. She appears well-developed and well-nourished. No distress.  Cardiovascular: Regular rhythm and normal heart sounds. Tachycardia present.  Pulmonary/Chest: Effort normal and breath sounds normal.  Neurological: She is alert and oriented to person, place, and time.  Skin: Skin is warm and dry.  Two raised fluctuant tender areas under right axilla, largest approximately 1.5 cm in diameter; noted some brown moist discharge in folds of axilla, but no active drainage from areas affected     UC Treatments / Results  Labs (all labs ordered are listed, but only abnormal results are displayed) Labs Reviewed - No data to display  EKG None  Radiology No results found.  Procedures Procedures (including critical care time)  Medications Ordered in UC Medications - No data to display  Initial Impression / Assessment and Plan / UC Course  I have reviewed the triage vital signs and the nursing notes.  Pertinent labs & imaging results that were available during my care of the patient were reviewed by me and considered in my medical decision making (see chart for details).     Patient adamantly does not want incision completed today. Discussed that this will allow for healing, especially to larger boil. She is on bactrim prophylactically. Course of additional keflex initiated at this time and to continue with supportive cares- warm compresses etc to try to promote drainage. Noted tachycardia, patient in quite a lot of pain. Encouraged follow up in the next few days here or with dermatology for recheck and likely will need incision. Patient verbalized understanding and agreeable to plan.   Final Clinical Impressions(s) / UC Diagnoses   Final diagnoses:  Abscess of axilla, right     Discharge Instructions     Warm compresses Continue with previously prescribed antibiotics. Please complete provided course. This likely will  need to be open, please return here or follow with dermatology for recheck.    ED Prescriptions    Medication Sig Dispense Auth. Provider   cephALEXin (KEFLEX) 500 MG  capsule Take 1 capsule (500 mg total) by mouth 4 (four) times daily for 10 days. 40 capsule Zigmund Gottron, NP     Controlled Substance Prescriptions [place] Controlled Substance Registry consulted? Not Applicable   Zigmund Gottron, NP 01/23/18 1224

## 2018-01-23 NOTE — ED Triage Notes (Signed)
Pt states that she has had an abcess under her right arm that is very painful and has not drained.

## 2018-01-23 NOTE — Discharge Instructions (Signed)
Warm compresses Continue with previously prescribed antibiotics. Please complete provided course. This likely will need to be open, please return here or follow with dermatology for recheck.

## 2018-05-30 DIAGNOSIS — G8929 Other chronic pain: Secondary | ICD-10-CM | POA: Insufficient documentation

## 2018-06-09 ENCOUNTER — Encounter: Payer: Self-pay | Admitting: *Deleted

## 2018-06-19 DIAGNOSIS — G43709 Chronic migraine without aura, not intractable, without status migrainosus: Secondary | ICD-10-CM | POA: Insufficient documentation

## 2018-09-05 DIAGNOSIS — M5136 Other intervertebral disc degeneration, lumbar region: Secondary | ICD-10-CM | POA: Insufficient documentation

## 2018-09-05 DIAGNOSIS — M51369 Other intervertebral disc degeneration, lumbar region without mention of lumbar back pain or lower extremity pain: Secondary | ICD-10-CM | POA: Insufficient documentation

## 2018-09-10 DIAGNOSIS — E890 Postprocedural hypothyroidism: Secondary | ICD-10-CM | POA: Insufficient documentation

## 2018-09-20 ENCOUNTER — Ambulatory Visit (HOSPITAL_COMMUNITY)
Admission: EM | Admit: 2018-09-20 | Discharge: 2018-09-20 | Disposition: A | Discharge status: Home / Self Care | Payer: Medicare Other | Attending: Internal Medicine | Admitting: Internal Medicine

## 2018-09-20 ENCOUNTER — Encounter (HOSPITAL_COMMUNITY): Payer: Self-pay | Admitting: Emergency Medicine

## 2018-09-20 DIAGNOSIS — L500 Allergic urticaria: Secondary | ICD-10-CM

## 2018-09-20 MED ORDER — PREDNISONE 10 MG PO TABS: 20.0000 mg | ORAL_TABLET | Freq: Every day | ORAL | 0 refills | Status: DC

## 2018-09-20 MED ORDER — DIPHENHYDRAMINE HCL 25 MG PO CAPS
25.0000 mg | ORAL_CAPSULE | Freq: Once | ORAL | Status: AC
  Administered 2018-09-20: 25 mg via ORAL

## 2018-09-20 MED ORDER — DIPHENHYDRAMINE HCL 25 MG PO CAPS
ORAL_CAPSULE | ORAL | Status: AC
  Filled 2018-09-20: qty 1

## 2018-09-20 NOTE — ED Triage Notes (Signed)
Pt here for rash after eating shrimp yesterday; pt sts hx of hives in past with itching

## 2018-09-20 NOTE — ED Provider Notes (Signed)
Crystal Mora    CSN: [id number] Arrival date & time: 09/20/18  1030     History   Chief Complaint Chief Complaint  Patient presents with  . Urticaria    HPI Crystal Mora is a 43 y.o. female.   43 year old female with chronic headaches presents to urgent care complaining of hives.  Rash began last night.  She thinks that it is in relation to some shrimp fried rice that she ate.  She has had allergy testing in the past that did not reveal any positive allergens.  She denies swelling of her tongue throat or lips.  She denies shortness of breath, fever or cough.     Past Medical History:  Diagnosis Date  . Anxiety   . Bipolar 1 disorder ([place])   . Depression   . Hypertension    states she has not took BP medication for 2 years  . Insomnia   . 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 Thyroid disease     There are no active problems to display for this patient.   Past Surgical History:  Procedure Laterality Date  . CESAREAN SECTION    . THYROID SURGERY    . TUBAL LIGATION    . tubal reversal Bilateral     OB History    Gravida  14   Para  8   Term  3   Preterm  5   AB  3   Living  8     SAB  1   TAB  2   Ectopic  0   Multiple  1   Live Births  8            Home Medications    Prior to Admission medications   Medication Sig Start Date End Date Taking? Authorizing Provider  aspirin EC 81 MG tablet Take 81 mg by mouth daily.    [provider]  Aspirin-Salicylamide-Caffeine (BC HEADACHE POWDER PO) Take 1 Package by mouth daily as needed (headache).    [provider]  cyclobenzaprine (FLEXERIL) 10 MG tablet Take 1 tablet (10 mg total) by mouth every 8 (eight) hours as needed for muscle spasms. 10/19/16   Starr Lake, CNM  cyclobenzaprine (FLEXERIL) 10 MG tablet Take 1 tablet (10 mg total) by mouth every 8 (eight) hours as needed for muscle spasms. 10/19/16   Starr Lake, CNM  predniSONE  (DELTASONE) 10 MG tablet Take 2 tablets (20 mg total) by mouth daily for 4 days. 09/20/18 09/24/18  Harrie Foreman, MD  ranitidine (ZANTAC) 150 MG capsule Take 150 mg by mouth 2 (two) times daily.    [provider]  sertraline (ZOLOFT) 25 MG tablet Take 25 mg by mouth at bedtime. 09/16/16   [provider]    Family History Family History  Problem Relation Age of Onset  . Depression Mother   . Mental illness Mother   . Asthma Mother   . COPD Mother   . Diabetes Mother   . Mental illness Brother   . Mental illness Daughter   . Diabetes Maternal Aunt   . Mental illness Maternal Aunt   . Depression Maternal Aunt   . Diabetes Maternal Grandmother     Social History Social History   Tobacco Use  . Smoking status: Current Every Day Smoker    Packs/day: 0.50    Types: Cigars  . Smokeless tobacco: Current User  . Tobacco comment: 1 cigar daily  Substance  Use Topics  . Alcohol use: Yes    Comment: Rare  . Drug use: No     Allergies   Biaxin [clarithromycin]; Morphine and related; Penicillins; and Percocet [oxycodone-acetaminophen]   Review of Systems Review of Systems  Constitutional: Negative for chills and fever.  HENT: Negative for sore throat and tinnitus.   Eyes: Negative for redness.  Respiratory: Negative for cough and shortness of breath.   Cardiovascular: Negative for chest pain and palpitations.  Gastrointestinal: Negative for abdominal pain, diarrhea, nausea and vomiting.  Genitourinary: Negative for dysuria, frequency and urgency.  Musculoskeletal: Negative for myalgias.  Skin: Positive for rash.       No lesions  Neurological: Negative for weakness.  Hematological: Does not bruise/bleed easily.  Psychiatric/Behavioral: Negative for suicidal ideas.     Physical Exam Triage Vital Signs ED Triage Vitals [09/20/18 1113]  Enc Vitals Group     BP (!) 144/88     Pulse Rate 100     Resp 18     Temp 98.2 F (36.8 C)     Temp Source  Temporal     SpO2 100 %     Weight      Height      Head Circumference      Peak Flow      Pain Score 6     Pain Loc      Pain Edu?      Excl. in [place]?    No data found.  Updated Vital Signs BP (!) 144/88 (BP Location: Right Arm)   Pulse 100   Temp 98.2 F (36.8 C) (Temporal)   Resp 18   SpO2 100%   Visual Acuity Right Eye Distance:   Left Eye Distance:   Bilateral Distance:    Right Eye Near:   Left Eye Near:    Bilateral Near:     Physical Exam Vitals signs and nursing note reviewed.  Constitutional:      General: She is not in acute distress.    Appearance: She is well-developed.  HENT:     Head: Normocephalic and atraumatic.  Eyes:     General: No scleral icterus.    Conjunctiva/sclera: Conjunctivae normal.     Pupils: Pupils are equal, round, and reactive to light.  Neck:     Musculoskeletal: Normal range of motion and neck supple.     Thyroid: No thyromegaly.     Vascular: No JVD.     Trachea: No tracheal deviation.  Cardiovascular:     Rate and Rhythm: Normal rate and regular rhythm.     Heart sounds: Normal heart sounds. No murmur. No friction rub. No gallop.   Pulmonary:     Effort: Pulmonary effort is normal.     Breath sounds: Normal breath sounds.  Abdominal:     General: Bowel sounds are normal. There is no distension.     Palpations: Abdomen is soft.     Tenderness: There is no abdominal tenderness.  Musculoskeletal: Normal range of motion.  Lymphadenopathy:     Cervical: No cervical adenopathy.  Skin:    General: Skin is warm and dry.     Findings: Rash (Rash in a mapping pattern that comes and goes) present.  Neurological:     Mental Status: She is alert and oriented to person, place, and time.     Cranial Nerves: No cranial nerve deficit.  Psychiatric:        Behavior: Behavior normal.  Thought Content: Thought content normal.        Judgment: Judgment normal.      UC Treatments / Results  Labs (all labs ordered are  listed, but only abnormal results are displayed) Labs Reviewed - No data to display  EKG None  Radiology No results found.  Procedures Procedures (including critical care time)  Medications Ordered in UC Medications  diphenhydrAMINE (BENADRYL) capsule 25 mg (25 mg Oral Given 09/20/18 1202)    Initial Impression / Assessment and Plan / UC Course  I have reviewed the triage vital signs and the nursing notes.  Pertinent labs & imaging results that were available during my care of the patient were reviewed by me and considered in my medical decision making (see chart for details).     Urticarial rash.  Given Benadryl in clinic.  Pulse of prednisone prescribed. Final Clinical Impressions(s) / UC Diagnoses   Final diagnoses:  Allergic urticaria   Discharge Instructions   None    ED Prescriptions    Medication Sig Dispense Auth. Provider   predniSONE (DELTASONE) 10 MG tablet Take 2 tablets (20 mg total) by mouth daily for 4 days. 8 tablet Harrie Foreman, MD     Controlled Substance Prescriptions [place] Controlled Substance Registry consulted? Not Applicable   Harrie Foreman, MD 09/20/18 [phone]

## 2018-09-23 ENCOUNTER — Encounter (HOSPITAL_COMMUNITY): Payer: Self-pay | Admitting: Emergency Medicine

## 2018-09-23 ENCOUNTER — Emergency Department (HOSPITAL_COMMUNITY)
Admission: EM | Admit: 2018-09-23 | Discharge: 2018-09-24 | Disposition: A | Discharge status: Home / Self Care | Payer: Medicare Other | Attending: Emergency Medicine | Admitting: Emergency Medicine

## 2018-09-23 ENCOUNTER — Other Ambulatory Visit: Payer: Self-pay

## 2018-09-23 DIAGNOSIS — Z7982 Long term (current) use of aspirin: Secondary | ICD-10-CM | POA: Diagnosis not present

## 2018-09-23 DIAGNOSIS — Z79899 Other long term (current) drug therapy: Secondary | ICD-10-CM | POA: Insufficient documentation

## 2018-09-23 DIAGNOSIS — I10 Essential (primary) hypertension: Secondary | ICD-10-CM | POA: Insufficient documentation

## 2018-09-23 DIAGNOSIS — R21 Rash and other nonspecific skin eruption: Secondary | ICD-10-CM | POA: Diagnosis present

## 2018-09-23 DIAGNOSIS — F17290 Nicotine dependence, other tobacco product, uncomplicated: Secondary | ICD-10-CM | POA: Insufficient documentation

## 2018-09-23 DIAGNOSIS — T7840XA Allergy, unspecified, initial encounter: Secondary | ICD-10-CM

## 2018-09-23 MED ORDER — SODIUM CHLORIDE 0.9 % IV BOLUS
1000.0000 mL | Freq: Once | INTRAVENOUS | Status: AC
  Administered 2018-09-23: 1000 mL via INTRAVENOUS

## 2018-09-23 MED ORDER — DIPHENHYDRAMINE HCL 50 MG/ML IJ SOLN
25.0000 mg | Freq: Once | INTRAMUSCULAR | Status: AC
  Administered 2018-09-23: 25 mg via INTRAVENOUS
  Filled 2018-09-23: qty 1

## 2018-09-23 MED ORDER — FAMOTIDINE IN NACL 20-0.9 MG/50ML-% IV SOLN
20.0000 mg | INTRAVENOUS | Status: AC
  Administered 2018-09-23: 20 mg via INTRAVENOUS
  Filled 2018-09-23: qty 50

## 2018-09-23 MED ORDER — METHYLPREDNISOLONE SODIUM SUCC 125 MG IJ SOLR
125.0000 mg | Freq: Once | INTRAMUSCULAR | Status: AC
  Administered 2018-09-23: 125 mg via INTRAVENOUS
  Filled 2018-09-23: qty 2

## 2018-09-23 NOTE — ED Provider Notes (Signed)
[facility] EMERGENCY DEPARTMENT Provider Note   CSN: [id number] Arrival date & time: 09/23/18  2142     History   Chief Complaint Chief Complaint  Patient presents with  . Allergic Reaction    HPI Crystal Mora is a 43 y.o. female.  The history is provided by the patient and medical records.  Allergic Reaction  Presenting symptoms: rash      43 y.o. F with history of anxiety, bipolar disorder, depression, HTN, neuromuscular disorder, thyroid disease, presenting to the ED for allergic reaction.  Patient reports this started a few days ago-- seen at urgent care and started on regimen of prednisone and benadryl.  States she was doing ok, finished prednisone this morning and rash has since returned.  She has hives on her neck, chest, and arms.  States her lips are tingling and she feels like she has a sore throat but denies difficulty swallowing, speaking, or SOB.  She thinks reaction is due to shrimp.  She has not eaten any other new foods recently.  No new medications.  No new soaps, detergents, make-up, or other personal care products.  She last took benadryl 5 hours ago.  Has had prior allergy test which did not show any food/environmental allergies.  Not currently on ACEI.  Past Medical History:  Diagnosis Date  . Anxiety   . Bipolar 1 disorder ([place])   . Depression   . Hypertension    states she has not took BP medication for 2 years  . Insomnia   . Neuromuscular disorder ([place])    back  . Thyroid disease     There are no active problems to display for this patient.   Past Surgical History:  Procedure Laterality Date  . CESAREAN SECTION    . THYROID SURGERY    . TUBAL LIGATION    . tubal reversal Bilateral      OB History    Gravida  14   Para  8   Term  3   Preterm  5   AB  3   Living  8     SAB  1   TAB  2   Ectopic  0   Multiple  1   Live Births  8            Home Medications    Prior to Admission medications     Medication Sig Start Date End Date Taking? Authorizing Provider  aspirin EC 81 MG tablet Take 81 mg by mouth daily.    [provider]  Aspirin-Salicylamide-Caffeine (BC HEADACHE POWDER PO) Take 1 Package by mouth daily as needed (headache).    [provider]  cyclobenzaprine (FLEXERIL) 10 MG tablet Take 1 tablet (10 mg total) by mouth every 8 (eight) hours as needed for muscle spasms. 10/19/16   Starr Lake, CNM  cyclobenzaprine (FLEXERIL) 10 MG tablet Take 1 tablet (10 mg total) by mouth every 8 (eight) hours as needed for muscle spasms. 10/19/16   Starr Lake, CNM  predniSONE (DELTASONE) 10 MG tablet Take 2 tablets (20 mg total) by mouth daily for 4 days. 09/20/18 09/24/18  Harrie Foreman, MD  ranitidine (ZANTAC) 150 MG capsule Take 150 mg by mouth 2 (two) times daily.    [provider]  sertraline (ZOLOFT) 25 MG tablet Take 25 mg by mouth at bedtime. 09/16/16   [provider]    Family History Family History  Problem Relation Age of Onset  .  Depression Mother   . Mental illness Mother   . Asthma Mother   . COPD Mother   . Diabetes Mother   . Mental illness Brother   . Mental illness Daughter   . Diabetes Maternal Aunt   . Mental illness Maternal Aunt   . Depression Maternal Aunt   . Diabetes Maternal Grandmother     Social History Social History   Tobacco Use  . Smoking status: Current Every Day Smoker    Packs/day: 0.50    Types: Cigars  . Smokeless tobacco: Current User  . Tobacco comment: 1 cigar daily  Substance Use Topics  . Alcohol use: Yes    Comment: Rare  . Drug use: No     Allergies   Biaxin [clarithromycin]; Morphine and related; Penicillins; and Percocet [oxycodone-acetaminophen]   Review of Systems Review of Systems  Skin: Positive for rash.  All other systems reviewed and are negative.    Physical Exam Updated Vital Signs BP (!) 157/95   Pulse (!) 114   Temp 98.2 F (36.8 C)  (Oral)   Resp 18   SpO2 99%   Physical Exam Vitals signs and nursing note reviewed.  Constitutional:      Appearance: She is well-developed.  HENT:     Head: Normocephalic and atraumatic.     Comments: No lip/tongue swelling, no oral lesions, handling secretions well, normal phonation without stridor Eyes:     Conjunctiva/sclera: Conjunctivae normal.     Pupils: Pupils are equal, round, and reactive to light.  Neck:     Musculoskeletal: Normal range of motion.  Cardiovascular:     Rate and Rhythm: Normal rate and regular rhythm.     Heart sounds: Normal heart sounds.  Pulmonary:     Effort: Pulmonary effort is normal.     Breath sounds: Normal breath sounds.  Abdominal:     General: Bowel sounds are normal.     Palpations: Abdomen is soft.  Musculoskeletal: Normal range of motion.  Skin:    General: Skin is warm and dry.     Findings: Rash present. Rash is urticarial.     Comments: Urticarial rash to neck, chest, and upper arms; no lesions on palms/soles  Neurological:     Mental Status: She is alert and oriented to person, place, and time.      ED Treatments / Results  Labs (all labs ordered are listed, but only abnormal results are displayed) Labs Reviewed - No data to display  EKG None  Radiology No results found.  Procedures Procedures (including critical care time)  Medications Ordered in ED Medications  methylPREDNISolone sodium succinate (SOLU-MEDROL) 125 mg/2 mL injection 125 mg (125 mg Intravenous Given 09/23/18 2327)  diphenhydrAMINE (BENADRYL) injection 25 mg (25 mg Intravenous Given 09/23/18 2328)  famotidine (PEPCID) IVPB 20 mg premix (0 mg Intravenous Stopped 09/24/18 0100)  sodium chloride 0.9 % bolus 1,000 mL (0 mLs Intravenous Stopped 09/24/18 0100)     Initial Impression / Assessment and Plan / ED Course  I have reviewed the triage vital signs and the nursing notes.  Pertinent labs & imaging results that were available during my care of the  patient were reviewed by me and considered in my medical decision making (see chart for details).  43 year old female here with allergic reaction.  This initially started on 09/20/2018, seen at urgent care and started on steroids and Benadryl.  Rash was initially improving but has since recurred.  Thinks it is due to strength.  She  did have allergy testing last year that was negative for any food or environmental allergens.  She has not had any abnormal food intake, changes in soaps, detergents, or other personal care products.  On exam patient does have a urticarial rash to neck, chest, and upper arms.  There are no lesions on the palms or soles.  She is complaining of some lip tingling and a sore throat but no difficulty swallowing at this time.  She has normal phonation without stridor.  Airway is patent, no oral lesions.  Will give trial of IV solu-medrol, benadryl, and pepcid.  Will monitor.  12:43 AM Rash faintly present at this time.  Sore throat symptoms and tingling of lips has fully resolved.   Airway remains widely patent, tolerating water, VSS.  Feel she is stable for discharge.  Will continue prednisone taper, benadryl, and pepcid.  Will also send with epi-pen given new development of oral symptoms today-- she was instructed on indications for use and need for ED evaluation if used.  She will follow-up closely with PCP.  Return here for any new/acute changes.  Final Clinical Impressions(s) / ED Diagnoses   Final diagnoses:  Allergic reaction, initial encounter  Rash    ED Discharge Orders         Ordered    predniSONE (DELTASONE) 20 MG tablet     09/24/18 0051    famotidine (PEPCID) 20 MG tablet  Daily     09/24/18 0051    EPINEPHrine (EPIPEN 2-PAK) 0.3 mg/0.3 mL IJ SOAJ injection  Once PRN     09/24/18 0051           Larene Pickett, PA-C 09/24/18 Layla Maw    Virgel Manifold, MD 09/24/18 1228

## 2018-09-23 NOTE — ED Triage Notes (Addendum)
Patient here with allergic reaction.  She states that she went to the urgent care and was given Benadryl and prednisone.  She took Benadryl 4 hours ago and prednisone this morning and she is continuing with whelps and swelling of the right lower eye lid.  She does have some itchiness at this time.  She states that she is having pain when she swallows, no swelling of the tongue or shortness of breath.

## 2018-09-23 NOTE — ED Notes (Signed)
ED Provider at bedside. 

## 2018-09-24 MED ORDER — PREDNISONE 20 MG PO TABS: ORAL_TABLET | ORAL | 0 refills | Status: DC

## 2018-09-24 MED ORDER — EPINEPHRINE 0.3 MG/0.3ML IJ SOAJ: 0.3000 mg | Freq: Once | INTRAMUSCULAR | 1 refills | Status: AC | PRN

## 2018-09-24 MED ORDER — FAMOTIDINE 20 MG PO TABS: 20.0000 mg | ORAL_TABLET | Freq: Every day | ORAL | 0 refills | Status: DC

## 2018-09-24 NOTE — Discharge Instructions (Signed)
Take the prednisone and pepcid as prescribed.  I would continue benadryl every 6-8 hours as needed, can take 25-50mg  at a time (1-2 tablets). If you start having difficulty swallowing, shortness of breath, sensation of throat closing, then use the epi pen.  Like we discussed, if used then you need to come to the hospital for evaluation/monitoring. Follow-up with you primary care doctor. Return to the ED for new or worsening symptoms.

## 2018-09-24 NOTE — ED Notes (Signed)
Patient verbalizes understanding of discharge instructions. Opportunity for questioning and answers were provided. Armband removed by staff, pt discharged from ED ambulatory.   

## 2018-10-01 ENCOUNTER — Other Ambulatory Visit: Payer: Self-pay

## 2018-10-01 ENCOUNTER — Encounter (HOSPITAL_COMMUNITY): Payer: Self-pay | Admitting: Emergency Medicine

## 2018-10-01 ENCOUNTER — Ambulatory Visit (INDEPENDENT_AMBULATORY_CARE_PROVIDER_SITE_OTHER)
Admission: EM | Admit: 2018-10-01 | Discharge: 2018-10-01 | Disposition: A | Discharge status: Home / Self Care | Payer: Medicare Other | Source: Home / Self Care

## 2018-10-01 ENCOUNTER — Emergency Department (HOSPITAL_COMMUNITY)
Admission: EM | Admit: 2018-10-01 | Discharge: 2018-10-01 | Disposition: A | Discharge status: Home / Self Care | Payer: Medicare Other | Attending: Emergency Medicine | Admitting: Emergency Medicine

## 2018-10-01 ENCOUNTER — Encounter (HOSPITAL_COMMUNITY): Payer: Self-pay | Admitting: Family Medicine

## 2018-10-01 DIAGNOSIS — I10 Essential (primary) hypertension: Secondary | ICD-10-CM

## 2018-10-01 DIAGNOSIS — T7840XD Allergy, unspecified, subsequent encounter: Secondary | ICD-10-CM

## 2018-10-01 DIAGNOSIS — L509 Urticaria, unspecified: Secondary | ICD-10-CM | POA: Insufficient documentation

## 2018-10-01 DIAGNOSIS — Z5321 Procedure and treatment not carried out due to patient leaving prior to being seen by health care provider: Secondary | ICD-10-CM | POA: Insufficient documentation

## 2018-10-01 MED ORDER — METHYLPREDNISOLONE SODIUM SUCC 125 MG IJ SOLR
INTRAMUSCULAR | Status: AC
  Filled 2018-10-01: qty 2

## 2018-10-01 MED ORDER — METHYLPREDNISOLONE SODIUM SUCC 125 MG IJ SOLR
125.0000 mg | Freq: Once | INTRAMUSCULAR | Status: AC
  Administered 2018-10-01: 125 mg via INTRAMUSCULAR

## 2018-10-01 MED ORDER — DIPHENHYDRAMINE HCL 25 MG PO CAPS
25.0000 mg | ORAL_CAPSULE | Freq: Once | ORAL | Status: AC
  Administered 2018-10-01: 25 mg via ORAL

## 2018-10-01 MED ORDER — DIPHENHYDRAMINE HCL 25 MG PO CAPS
ORAL_CAPSULE | ORAL | Status: AC
  Filled 2018-10-01: qty 1

## 2018-10-01 MED ORDER — PREDNISONE 10 MG (21) PO TBPK: ORAL_TABLET | ORAL | 0 refills | Status: DC

## 2018-10-01 NOTE — ED Triage Notes (Signed)
Pt presents to [facility] for assessment of continued rash, facial and lip swelling since an allergic reaction 1 week ago with prednisone completed.  Pt took Benadryl this am at 4am, last dose of pepcid at 4am as well.  Rash noted to back, facial swelling pt states is not worsening, denies SOB at this time.

## 2018-10-01 NOTE — ED Notes (Signed)
Patient able to ambulate independently  

## 2018-10-01 NOTE — Discharge Instructions (Addendum)
We will treat you for an allergic reaction today. Please follow up with your allergist.  Prednisone taper sent to the pharmacy.  Benadryl as needed Continue the Pepcid daily I recommend possibly having some more work-up done to exclude autoimmune disease.

## 2018-10-01 NOTE — ED Notes (Signed)
After triage, pt notified nursing staff she had to leave by 0600. States she'd rather just take Benadryl at home and return for worsening symptoms. Notified of risks upon leaving. Has appt w/her allergist today

## 2018-10-01 NOTE — ED Triage Notes (Signed)
Pt in w/allergic rxn, generalized hives present. Denies any sob, does have throat itching and pain when swallowing. States she came in >1wk ago for rxn to shrimp, has completed prednisone course and cont'd taking benadryl and pepcid w/no relief. No tongue swelling present, sats 98% on RA

## 2018-10-06 NOTE — ED Provider Notes (Signed)
[facility]    CSN: [id number] Arrival date & time: 10/01/18  0806     History   Chief Complaint Chief Complaint  Patient presents with  . Allergic Reaction    HPI Crystal Mora is a 43 y.o. female.   Patient is a 43 year old female presents with possible allergic reaction.  She has widespread urticaria, itching, facial swelling.  This started this morning.  She had same reaction approximately 1 week ago and has completed prednisone.  She took Benadryl this morning at 4 AM and also took Pepcid.  She is been taking Pepcid daily.  Denies any trouble swallowing, breathing or shortness of breath.  She is unsure what is causing his large reactions.  She is scheduled see her allergist in upcoming days. No fever joint pain.  ROS per HPI      Past Medical History:  Diagnosis Date  . Anxiety   . Bipolar 1 disorder ([place])   . Depression   . Hypertension    states she has not took BP medication for 2 years  . Insomnia   . Neuromuscular disorder ([place])    back  . Thyroid disease     There are no active problems to display for this patient.   Past Surgical History:  Procedure Laterality Date  . CESAREAN SECTION    . THYROID SURGERY    . TUBAL LIGATION    . tubal reversal Bilateral     OB History    Gravida  14   Para  8   Term  3   Preterm  5   AB  3   Living  8     SAB  1   TAB  2   Ectopic  0   Multiple  1   Live Births  8            Home Medications    Prior to Admission medications   Medication Sig Start Date End Date Taking? Authorizing Provider  aspirin EC 81 MG tablet Take 81 mg by mouth daily.   Yes [provider]  atorvastatin (LIPITOR) 20 MG tablet Take 20 mg by mouth daily.   Yes [provider]  cetirizine (ZYRTEC) 10 MG tablet Take 10 mg by mouth daily.   Yes [provider]  cyclobenzaprine (FLEXERIL) 10 MG tablet Take 1 tablet (10 mg total) by mouth every 8 (eight) hours as needed for  muscle spasms. 10/19/16  Yes Starr Lake, CNM  DULoxetine (CYMBALTA) 60 MG capsule Take 60 mg by mouth daily.   Yes [provider]  famotidine (PEPCID) 20 MG tablet Take 1 tablet (20 mg total) by mouth daily. 09/24/18  Yes Larene Pickett, PA-C  losartan (COZAAR) 100 MG tablet Take 100 mg by mouth daily.   Yes [provider]  minocycline (MINOCIN,DYNACIN) 100 MG capsule Take 100 mg by mouth 2 (two) times daily.   Yes [provider]  ranitidine (ZANTAC) 150 MG capsule Take 150 mg by mouth 2 (two) times daily.   Yes [provider]  rizatriptan (MAXALT) 10 MG tablet Take 10 mg by mouth as needed for migraine. May repeat in 2 hours if needed   Yes [provider]  tiZANidine (ZANAFLEX) 4 MG capsule Take 4 mg by mouth 3 (three) times daily.   Yes [provider]  Aspirin-Salicylamide-Caffeine (BC HEADACHE POWDER PO) Take 1 Package by mouth daily as needed (headache).    [provider]  cyclobenzaprine (  FLEXERIL) 10 MG tablet Take 1 tablet (10 mg total) by mouth every 8 (eight) hours as needed for muscle spasms. 10/19/16   Starr Lake, CNM  diclofenac (VOLTAREN) 75 MG EC tablet Take 75 mg by mouth 2 (two) times daily.    [provider]  EPINEPHrine (EPIPEN 2-PAK) 0.3 mg/0.3 mL IJ SOAJ injection Inject 0.3 mLs (0.3 mg total) into the muscle once as needed for up to 1 dose (for severe allergic reaction). CAll 911 immediately if you have to use this medicine 09/24/18   [facility], Vilinda Blanks, PA-C  predniSONE (STERAPRED UNI-PAK 21 TAB) 10 MG (21) TBPK tablet 6 tabs for 1 day, then 5 tabs for 1 das, then 4 tabs for 1 day, then 3 tabs for 1 day, 2 tabs for 1 day, then 1 tab for 1 day 10/01/18   Loura Halt A, NP  sertraline (ZOLOFT) 25 MG tablet Take 25 mg by mouth at bedtime. 09/16/16   [provider]    Family History Family History  Problem Relation Age of Onset  . Depression Mother   . Mental illness  Mother   . Asthma Mother   . COPD Mother   . Diabetes Mother   . Mental illness Brother   . Mental illness Daughter   . Diabetes Maternal Aunt   . Mental illness Maternal Aunt   . Depression Maternal Aunt   . Diabetes Maternal Grandmother     Social History Social History   Tobacco Use  . Smoking status: Current Every Day Smoker    Packs/day: 0.50    Types: Cigars  . Smokeless tobacco: Current User  . Tobacco comment: 1 cigar daily  Substance Use Topics  . Alcohol use: Yes    Comment: Rare  . Drug use: No     Allergies   Biaxin [clarithromycin]; Morphine and related; Penicillins; Percocet [oxycodone-acetaminophen]; and Shrimp [shellfish allergy]   Review of Systems Review of Systems   Physical Exam Triage Vital Signs ED Triage Vitals  Enc Vitals Group     BP 10/01/18 0829 (!) 148/97     Pulse Rate 10/01/18 0829 88     Resp 10/01/18 0829 18     Temp 10/01/18 0829 98.4 F (36.9 C)     Temp Source 10/01/18 0829 Oral     SpO2 10/01/18 0829 97 %     Weight --      Height --      Head Circumference --      Peak Flow --      Pain Score 10/01/18 0830 10     Pain Loc --      Pain Edu? --      Excl. in [place]? --    No data found.  Updated Vital Signs BP (!) 148/97 (BP Location: Left Arm)   Pulse 88   Temp 98.4 F (36.9 C) (Oral)   Resp 18   LMP 10/01/2018   SpO2 97%   Visual Acuity Right Eye Distance:   Left Eye Distance:   Bilateral Distance:    Right Eye Near:   Left Eye Near:    Bilateral Near:     Physical Exam Constitutional:      Appearance: Normal appearance.  HENT:     Head: Normocephalic and atraumatic.     Comments: Mild left sided facial swelling.     Nose: Nose normal.     Mouth/Throat:     Pharynx: Oropharynx is clear.     Comments: Mid  lip swelling.  Neck:     Musculoskeletal: Normal range of motion and neck supple.  Pulmonary:     Effort: Pulmonary effort is normal.  Musculoskeletal: Normal range of motion.  Skin:     Findings: Rash present.     Comments: Widespread urticaria.   Neurological:     Mental Status: She is alert.  Psychiatric:        Mood and Affect: Mood normal.      UC Treatments / Results  Labs (all labs ordered are listed, but only abnormal results are displayed) Labs Reviewed - No data to display  EKG None  Radiology No results found.  Procedures Procedures (including critical care time)  Medications Ordered in UC Medications  methylPREDNISolone sodium succinate (SOLU-MEDROL) 125 mg/2 mL injection 125 mg (125 mg Intramuscular Given 10/01/18 0904)  diphenhydrAMINE (BENADRYL) capsule 25 mg (25 mg Oral Given 10/01/18 0903)    Initial Impression / Assessment and Plan / UC Course  I have reviewed the triage vital signs and the nursing notes.  Pertinent labs & imaging results that were available during my care of the patient were reviewed by me and considered in my medical decision making (see chart for details).    Will go ahead and treat pt for allergic reaction of unknown cause.  Solumedrol and benadryl given here in the clinic.  Prednisone taper for home. She can take benadryl as needed and continue with her Pepcid daily.  Monitor and if symptoms worsen she needs to go to the ER.  Follow up with her allergist as planned.  Final Clinical Impressions(s) / UC Diagnoses   Final diagnoses:  Allergic reaction, subsequent encounter     Discharge Instructions     We will treat you for an allergic reaction today. Please follow up with your allergist.  Prednisone taper sent to the pharmacy.  Benadryl as needed Continue the Pepcid daily I recommend possibly having some more work-up done to exclude autoimmune disease.     ED Prescriptions    Medication Sig Dispense Auth. Provider   predniSONE (STERAPRED UNI-PAK 21 TAB) 10 MG (21) TBPK tablet 6 tabs for 1 day, then 5 tabs for 1 das, then 4 tabs for 1 day, then 3 tabs for 1 day, 2 tabs for 1 day, then 1 tab for 1 day 21  tablet Rozanna Box, Breeana Sawtelle A, NP     Controlled Substance Prescriptions [place] Controlled Substance Registry consulted? Not Applicable   Orvan July, NP 10/06/18 [phone]

## 2018-10-30 DIAGNOSIS — E782 Mixed hyperlipidemia: Secondary | ICD-10-CM | POA: Insufficient documentation

## 2018-10-30 DIAGNOSIS — F32A Depression, unspecified: Secondary | ICD-10-CM | POA: Insufficient documentation

## 2018-10-30 DIAGNOSIS — I10 Essential (primary) hypertension: Secondary | ICD-10-CM | POA: Insufficient documentation

## 2019-01-06 DIAGNOSIS — M5412 Radiculopathy, cervical region: Secondary | ICD-10-CM | POA: Insufficient documentation

## 2019-01-28 DIAGNOSIS — Z6828 Body mass index (BMI) 28.0-28.9, adult: Secondary | ICD-10-CM | POA: Insufficient documentation

## 2019-04-30 DIAGNOSIS — H9190 Unspecified hearing loss, unspecified ear: Secondary | ICD-10-CM | POA: Insufficient documentation

## 2019-04-30 DIAGNOSIS — R7301 Impaired fasting glucose: Secondary | ICD-10-CM | POA: Insufficient documentation

## 2019-06-23 ENCOUNTER — Other Ambulatory Visit: Payer: Self-pay

## 2019-06-23 DIAGNOSIS — Z20822 Contact with and (suspected) exposure to covid-19: Secondary | ICD-10-CM

## 2019-06-24 LAB — NOVEL CORONAVIRUS, NAA: SARS-CoV-2, NAA: NOT DETECTED

## 2019-08-01 ENCOUNTER — Ambulatory Visit (HOSPITAL_COMMUNITY)
Admission: EM | Admit: 2019-08-01 | Discharge: 2019-08-01 | Disposition: A | Discharge status: Home / Self Care | Payer: Medicare Other | Attending: Emergency Medicine | Admitting: Emergency Medicine

## 2019-08-01 ENCOUNTER — Other Ambulatory Visit: Payer: Self-pay

## 2019-08-01 ENCOUNTER — Encounter (HOSPITAL_COMMUNITY): Payer: Self-pay | Admitting: *Deleted

## 2019-08-01 DIAGNOSIS — L509 Urticaria, unspecified: Secondary | ICD-10-CM | POA: Diagnosis not present

## 2019-08-01 MED ORDER — DEXAMETHASONE SODIUM PHOSPHATE 10 MG/ML IJ SOLN
INTRAMUSCULAR | Status: AC
  Filled 2019-08-01: qty 1

## 2019-08-01 MED ORDER — DEXAMETHASONE SODIUM PHOSPHATE 10 MG/ML IJ SOLN
10.0000 mg | Freq: Once | INTRAMUSCULAR | Status: AC
  Administered 2019-08-01: 14:00:00 10 mg via INTRAMUSCULAR

## 2019-08-01 MED ORDER — PREDNISONE 10 MG (21) PO TBPK: ORAL_TABLET | ORAL | 0 refills | Status: DC

## 2019-08-01 NOTE — Discharge Instructions (Addendum)
This flare could be caused from your increased exercise.  I would hold off on the prednisone for now.  Go ahead and start it if the dexamethasone has not made you feel any better in the next 12 hours, or if your hives resolve and then return.  You may want to talk to your allergist about possibly starting Singulair, which can be used for urticaria although it is an off label use.

## 2019-08-01 NOTE — ED Triage Notes (Signed)
Reports receiving allergy shot 3 days ago; that day broke out in generalized urticaria.  States Dr has not been in office since then.  Has been taking Zyrtec, Benadryl, Pepcid without any relief.  Denies any oral or throat sensations or wheezing.

## 2019-08-01 NOTE — ED Provider Notes (Signed)
HPI  SUBJECTIVE:  Crystal Mora is a 43 y.o. female who presents recurrent diffuse urticaria over her entire body starting last night.  She reports constant itching, pain secondary to the edema.  She reports sensitivity to touch.  She had a Xolair injection 3 days ago.  She gets Xolair every 2 weeks.  She has been on this for 6 months without any problem.  No new lotions, soaps, detergents, foods, beverages, new medications.  She was started on cyclosporine 2 months ago and has also been fine with this.  No other change in her medications.  She denies lip or tongue swelling, sensation of throat swelling shut, difficulty breathing, nausea, vomiting, diarrhea, syncope.  She states that she is exercising more in an effort to lose weight to help alleviate back pain.  She has been taking 40 mg of Pepcid, Zyrtec twice daily, 50 mg of Benadryl 3 times daily without improvement in her symptoms.  Her symptoms are better and worse with scratching.  She has been seen here before for allergic reactions.  Last visit was in February of this year, with widespread urticaria itching facial swelling.  Was given Solu-Medrol, Benadryl, sent home with prednisone taper.  Advised to continue Benadryl and Pepcid.  Patient did  see her allergist on 12/17 for Xolair injection.  States that she gets it twice a month in an effort to prevent flares in between injections.  last dose of prednisone was 40 mg for 5 days starting 12/9.  Past Medical History:  Diagnosis Date  . Anxiety   . Bipolar 1 disorder ([place])   . Depression   . Hypertension    states she has not took BP medication for 2 years  . Insomnia   . 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 Thyroid disease     Past Surgical History:  Procedure Laterality Date  . CESAREAN SECTION    . THYROID SURGERY    . TUBAL LIGATION    . tubal reversal Bilateral     Family History  Problem Relation Age of Onset  . Depression Mother   . Mental illness Mother   .  Asthma Mother   . COPD Mother   . Diabetes Mother   . Mental illness Brother   . Mental illness Daughter   . Diabetes Maternal Aunt   . Mental illness Maternal Aunt   . Depression Maternal Aunt   . Diabetes Maternal Grandmother     Social History   Tobacco Use  . Smoking status: Current Every Day Smoker    Packs/day: 0.50    Types: Cigars  . Smokeless tobacco: Never Used  . Tobacco comment: 1 cigar daily  Substance Use Topics  . Alcohol use: Yes    Comment: Rare  . Drug use: No    No current facility-administered medications for this encounter.  Current Outpatient Medications:  .  aspirin EC 81 MG tablet, Take 81 mg by mouth daily., Disp: , Rfl:  .  atorvastatin (LIPITOR) 20 MG tablet, Take 20 mg by mouth daily., Disp: , Rfl:  .  cetirizine (ZYRTEC) 10 MG tablet, Take 10 mg by mouth daily., Disp: , Rfl:  .  cyclobenzaprine (FLEXERIL) 10 MG tablet, Take 1 tablet (10 mg total) by mouth every 8 (eight) hours as needed for muscle spasms., Disp: 30 tablet, Rfl: 1 .  CYCLOSPORINE PO, Take by mouth., Disp: , Rfl:  .  diclofenac (VOLTAREN) 75 MG EC tablet, Take 75 mg by mouth 2 (  two) times daily., Disp: , Rfl:  .  DULoxetine (CYMBALTA) 60 MG capsule, Take 60 mg by mouth daily., Disp: , Rfl:  .  famotidine (PEPCID) 20 MG tablet, Take 1 tablet (20 mg total) by mouth daily., Disp: 30 tablet, Rfl: 0 .  losartan (COZAAR) 100 MG tablet, Take 100 mg by mouth daily., Disp: , Rfl:  .  ranitidine (ZANTAC) 150 MG capsule, Take 150 mg by mouth 2 (two) times daily., Disp: , Rfl:  .  sertraline (ZOLOFT) 25 MG tablet, Take 25 mg by mouth at bedtime., Disp: , Rfl: 1 .  tiZANidine (ZANAFLEX) 4 MG capsule, Take 4 mg by mouth 3 (three) times daily., Disp: , Rfl:  .  Aspirin-Salicylamide-Caffeine (BC HEADACHE POWDER PO), Take 1 Package by mouth daily as needed (headache)., Disp: , Rfl:  .  EPINEPHrine (EPIPEN 2-PAK) 0.3 mg/0.3 mL IJ SOAJ injection, Inject 0.3 mLs (0.3 mg total) into the muscle once as  needed for up to 1 dose (for severe allergic reaction). CAll 911 immediately if you have to use this medicine, Disp: 1 Device, Rfl: 1 .  minocycline (MINOCIN,DYNACIN) 100 MG capsule, Take 100 mg by mouth 2 (two) times daily., Disp: , Rfl:  .  predniSONE (STERAPRED UNI-PAK 21 TAB) 10 MG (21) TBPK tablet, Dispense one 6 day pack. Take as directed with food., Disp: 21 tablet, Rfl: 0 .  rizatriptan (MAXALT) 10 MG tablet, Take 10 mg by mouth as needed for migraine. May repeat in 2 hours if needed, Disp: , Rfl:   Allergies  Allergen Reactions  . Biaxin [Clarithromycin]   . Morphine And Related   . Penicillins Hives, Itching and Swelling    Has patient had a PCN reaction causing immediate rash, facial/tongue/throat swelling, SOB or lightheadedness with hypotension: No Has patient had a PCN reaction causing severe rash involving mucus membranes or skin necrosis: No Has patient had a PCN reaction that required hospitalization No Has patient had a PCN reaction occurring within the last 10 years: Yes If all of the above answers are "NO", then may proceed with Cephalosporin use.   Marland Kitchen Percocet [Oxycodone-Acetaminophen] Hives and Itching  . Shrimp [Shellfish Allergy]      ROS  As noted in HPI.   Physical Exam  BP 119/68 (BP Location: Right Arm)   Pulse (!) 118   Temp 98.5 F (36.9 C) (Oral)   LMP 07/16/2019 (Approximate)   SpO2 97%   Breastfeeding No    Constitutional: Well developed, well nourished, no acute distress Eyes:  EOMI, conjunctiva normal bilaterally HENT: Normocephalic, atraumatic,mucus membranes moist.  No angioedema of the lips or tongue.  Airway widely patent. Respiratory: Normal inspiratory effort, lungs clear bilaterally, good air movement Cardiovascular: Mild regular tachycardia GI: nondistended skin: Diffuse urticaria over arms, torso, legs.           Musculoskeletal: no deformities Neurologic: Alert & oriented x 3, no focal neuro deficits Psychiatric: Speech  and behavior appropriate   ED Course   Medications  dexamethasone (DECADRON) injection 10 mg (10 mg Intramuscular Given 08/01/19 1402)    No orders of the defined types were placed in this encounter.   No results found for this or any previous visit (from the past 24 hour(s)). No results found.  ED Clinical Impression  1. Urticaria      ED Assessment/Plan  Outside records reviewed.  As noted in HPI.  Patient with a flare of her chronic idiopathic urticaria.  Could be from exercise which she states that she  has been doing more recently.  No evidence of anaphylaxis.  Patient has an EpiPen at home already.  Will give 10 mg dexamethasone.  Home with a 6-day prednisone taper if the urticaria recurs.  She is to continue Benadryl, Pepcid, Zyrtec.  Advised her to talk with her allergist about possibly starting Singulair.  Discussed MDM, treatment plan, and plan for follow-up with patient. Discussed sn/sx that should prompt return to the ED. patient agrees with plan.   Meds ordered this encounter  Medications  . dexamethasone (DECADRON) injection 10 mg  . predniSONE (STERAPRED UNI-PAK 21 TAB) 10 MG (21) TBPK tablet    Sig: Dispense one 6 day pack. Take as directed with food.    Dispense:  21 tablet    Refill:  0    *This clinic note was created using [employer]. Therefore, there may be occasional mistakes despite careful proofreading.   ?    Melynda Ripple, MD 08/01/19 1501

## 2019-11-06 ENCOUNTER — Ambulatory Visit: Payer: Medicare Other | Attending: Internal Medicine

## 2019-11-06 DIAGNOSIS — Z23 Encounter for immunization: Secondary | ICD-10-CM

## 2019-11-06 NOTE — Progress Notes (Signed)
   Covid-19 Vaccination Clinic  Name:  MILANY GECK    MRN: [id number] DOB: Jan 08, 1976  11/06/2019  Ms. Gwinner was observed post Covid-19 immunization for 15 minutes without incident. She was provided with Vaccine Information Sheet and instruction to access the V-Safe system.   Ms. Coye was instructed to call 911 with any severe reactions post vaccine: Marland Kitchen Difficulty breathing  . Swelling of face and throat  . A fast heartbeat  . A bad rash all over body  . Dizziness and weakness   Immunizations Administered    Name Date Dose VIS Date Route   Pfizer COVID-19 Vaccine 11/06/2019  2:54 PM 0.3 mL 07/24/2019 Intramuscular   Manufacturer: [place]   Lot: [id number]   [place]: [id number]

## 2019-12-01 ENCOUNTER — Ambulatory Visit: Payer: Medicare Other | Attending: Internal Medicine

## 2019-12-01 DIAGNOSIS — Z23 Encounter for immunization: Secondary | ICD-10-CM

## 2019-12-01 NOTE — Progress Notes (Signed)
   Covid-19 Vaccination Clinic  Name:  Crystal Mora    MRN: [id number] DOB: July 03, 1976  12/01/2019  [name] was observed post Covid-19 immunization for 30 minutes based on pre-vaccination screening without incident. She was provided with Vaccine Information Sheet and instruction to access the V-Safe system.   [name] was instructed to call 911 with any severe reactions post vaccine: Marland Kitchen Difficulty breathing  . Swelling of face and throat  . A fast heartbeat  . A bad rash all over body  . Dizziness and weakness   Immunizations Administered    Name Date Dose VIS Date Route   Pfizer COVID-19 Vaccine 12/01/2019  9:22 AM 0.3 mL 10/07/2018 Intramuscular   Manufacturer: [place]   Lot: [id number]   [place]: [id number]

## 2019-12-18 ENCOUNTER — Encounter (HOSPITAL_COMMUNITY): Payer: Self-pay

## 2019-12-18 ENCOUNTER — Ambulatory Visit (HOSPITAL_COMMUNITY)
Admission: EM | Admit: 2019-12-18 | Discharge: 2019-12-18 | Disposition: A | Discharge status: Home / Self Care | Payer: Medicare Other | Attending: Internal Medicine | Admitting: Internal Medicine

## 2019-12-18 ENCOUNTER — Other Ambulatory Visit: Payer: Self-pay

## 2019-12-18 DIAGNOSIS — T7840XA Allergy, unspecified, initial encounter: Secondary | ICD-10-CM | POA: Diagnosis not present

## 2019-12-18 MED ORDER — HYDROXYZINE HCL 25 MG PO TABS: 25.0000 mg | ORAL_TABLET | Freq: Four times a day (QID) | ORAL | 0 refills | Status: DC | PRN

## 2019-12-18 MED ORDER — PREDNISONE 10 MG (21) PO TBPK: ORAL_TABLET | ORAL | 0 refills | Status: DC

## 2019-12-18 NOTE — ED Triage Notes (Signed)
Pt reports having hives in arms, hands, back and legs x 2 days. Pt reports she had hives episodes before.

## 2019-12-18 NOTE — ED Provider Notes (Signed)
[facility]    CSN: [id number] Arrival date & time: 12/18/19  8588      History   Chief Complaint Chief Complaint  Patient presents with  . Urticaria    HPI Crystal Mora is a 44 y.o. female with a history of multiple allergies on allergy shots and other oral medication for allergies comes to urgent care with complaints of pruritic rash over the arms, hands back and legs of 2 days duration.  Patient was out taking care of her vegetable garden 2 days before.  After gardening patient started having generalized pruritic rash.  Rash is generalized.  She denies any shortness of breath, wheezing, tongue swelling.  No nausea or vomiting.  No diarrhea.  No dizziness, lightheadedness or near syncopal episode. Past Medical History:  Diagnosis Date  . Anxiety   . Bipolar 1 disorder ([place])   . Depression   . Hypertension    states she has not took BP medication for 2 years  . Insomnia   . 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 Thyroid disease     There are no problems to display for this patient.   Past Surgical History:  Procedure Laterality Date  . CESAREAN SECTION    . THYROID SURGERY    . TUBAL LIGATION    . tubal reversal Bilateral     OB History    Gravida  14   Para  8   Term  3   Preterm  5   AB  3   Living  8     SAB  1   TAB  2   Ectopic  0   Multiple  1   Live Births  8            Home Medications    Prior to Admission medications   Medication Sig Start Date End Date Taking? Authorizing Provider  diphenhydrAMINE (BENADRYL) 50 MG capsule Take 50 mg by mouth every 6 (six) hours as needed.   Yes [provider]  omalizumab [place]vid Right) 150 MG/ML prefilled syringe Inject into the skin every 14 (fourteen) days.   Yes [provider]  aspirin EC 81 MG tablet Take 81 mg by mouth daily.    [provider]  Aspirin-Salicylamide-Caffeine (BC HEADACHE POWDER PO) Take 1 Package by mouth daily as needed (headache).     [provider]  atorvastatin (LIPITOR) 20 MG tablet Take 20 mg by mouth daily.    [provider]  cetirizine (ZYRTEC) 10 MG tablet Take 10 mg by mouth daily.    [provider]  cyclobenzaprine (FLEXERIL) 10 MG tablet Take 1 tablet (10 mg total) by mouth every 8 (eight) hours as needed for muscle spasms. 10/19/16   Starr Lake, CNM  CYCLOSPORINE PO Take by mouth.    [provider]  diclofenac (VOLTAREN) 75 MG EC tablet Take 75 mg by mouth 2 (two) times daily.    [provider]  DULoxetine (CYMBALTA) 60 MG capsule Take 60 mg by mouth daily.    [provider]  EPINEPHrine (EPIPEN 2-PAK) 0.3 mg/0.3 mL IJ SOAJ injection Inject 0.3 mLs (0.3 mg total) into the muscle once as needed for up to 1 dose (for severe allergic reaction). CAll 911 immediately if you have to use this medicine 09/24/18   Larene Pickett, PA-C  famotidine (PEPCID) 20 MG tablet Take 1 tablet (20 mg total) by mouth daily. 09/24/18   Larene Pickett, PA-C  hydrOXYzine (ATARAX/VISTARIL) 25 MG tablet Take 1 tablet (25 mg total) by mouth every 6 (six) hours as needed for itching. 12/18/19   Chase Picket, MD  losartan (COZAAR) 100 MG tablet Take 100 mg by mouth daily.    [provider]  minocycline (MINOCIN,DYNACIN) 100 MG capsule Take 100 mg by mouth 2 (two) times daily.    [provider]  predniSONE (STERAPRED UNI-PAK 21 TAB) 10 MG (21) TBPK tablet Dispense one 6 day pack. Take as directed with food. 12/18/19   Curtisha Bendix, Myrene Galas, MD  ranitidine (ZANTAC) 150 MG capsule Take 150 mg by mouth 2 (two) times daily.    [provider]  rizatriptan (MAXALT) 10 MG tablet Take 10 mg by mouth as needed for migraine. May repeat in 2 hours if needed    [provider]  sertraline (ZOLOFT) 25 MG tablet Take 25 mg by mouth at bedtime. 09/16/16   [provider]  tiZANidine (ZANAFLEX) 4 MG capsule Take 4 mg by mouth 3 (three) times daily.     [provider]    Family History Family History  Problem Relation Age of Onset  . Depression Mother   . Mental illness Mother   . Asthma Mother   . COPD Mother   . Diabetes Mother   . Mental illness Brother   . Mental illness Daughter   . Diabetes Maternal Aunt   . Mental illness Maternal Aunt   . Depression Maternal Aunt   . Diabetes Maternal Grandmother     Social History Social History   Tobacco Use  . Smoking status: Current Every Day Smoker    Packs/day: 0.50    Types: Cigars  . Smokeless tobacco: Never Used  . Tobacco comment: 1 cigar daily  Substance Use Topics  . Alcohol use: Yes    Comment: Rare  . Drug use: No     Allergies   Biaxin [clarithromycin], Morphine and related, Penicillins, Percocet [oxycodone-acetaminophen], Shrimp [shellfish allergy], Citalopram, and Topiramate   Review of Systems Review of Systems  Constitutional: Negative for activity change, diaphoresis and unexpected weight change.  HENT: Negative for congestion, postnasal drip and rhinorrhea.   Eyes: Negative for redness and itching.  Respiratory: Negative for chest tightness, shortness of breath and wheezing.   Cardiovascular: Negative for chest pain and palpitations.  Gastrointestinal: Negative for diarrhea, nausea and vomiting.  Musculoskeletal: Negative for arthralgias and myalgias.  Skin: Positive for color change and rash. Negative for wound.     Physical Exam Triage Vital Signs ED Triage Vitals  Enc Vitals Group     BP 12/18/19 0955 122/82     Pulse Rate 12/18/19 0955 100     Resp 12/18/19 0955 18     Temp 12/18/19 0955 99.1 F (37.3 C)     Temp Source 12/18/19 0955 Oral     SpO2 12/18/19 0955 95 %     Weight --      Height --      Head Circumference --      Peak Flow --      Pain Score 12/18/19 0953 0     Pain Loc --      Pain Edu? --      Excl. in [place]? --    No data found.  Updated Vital Signs BP 122/82 (BP Location: Right Arm)   Pulse 100    Temp 99.1 F (37.3 C) (Oral)   Resp 18   LMP  (Within Weeks) Comment: 1 week  SpO2 95%   Visual Acuity Right Eye Distance:   Left Eye Distance:   Bilateral Distance:    Right Eye Near:   Left Eye Near:    Bilateral Near:     Physical Exam Vitals and nursing note reviewed.  Constitutional:      General: She is not in acute distress.    Appearance: Normal appearance. She is not ill-appearing.  HENT:     Right Ear: Tympanic membrane normal.     Left Ear: Tympanic membrane normal.  Cardiovascular:     Rate and Rhythm: Normal rate and regular rhythm.     Pulses: Normal pulses.     Heart sounds: Normal heart sounds.  Pulmonary:     Effort: Pulmonary effort is normal.     Breath sounds: Normal breath sounds. No stridor. No wheezing, rhonchi or rales.  Abdominal:     General: Bowel sounds are normal.     Palpations: Abdomen is soft.     Tenderness: There is no abdominal tenderness. There is no guarding.     Hernia: No hernia is present.  Musculoskeletal:        General: No swelling or signs of injury. Normal range of motion.     Right lower leg: No edema.     Left lower leg: No edema.  Skin:    General: Skin is warm.     Capillary Refill: Capillary refill takes less than 2 seconds.     Findings: Erythema and rash present.  Neurological:     General: No focal deficit present.     Mental Status: She is alert and oriented to person, place, and time. Mental status is at baseline.  Psychiatric:        Mood and Affect: Mood normal.      UC Treatments / Results  Labs (all labs ordered are listed, but only abnormal results are displayed) Labs Reviewed - No data to display  EKG   Radiology No results found.  Procedures Procedures (including critical care time)  Medications Ordered in UC Medications - No data to display  Initial Impression / Assessment and Plan / UC Course  I have reviewed the triage vital signs and the nursing notes.  Pertinent labs & imaging  results that were available during my care of the patient were reviewed by me and considered in my medical decision making (see chart for details).    1.  Urticarial rash secondary to environmental allergens: Tapering dose of prednisone Hydroxyzine as needed for itching Return precautions given If patient experiences worsening symptoms, worsening wheezing, shortness of breath, chest pain or chest tightness she is advised to return to the emergency department to be evaluated immediately. Avoidance of environmental allergens will be helpful although difficult to achieve. Final Clinical Impressions(s) / UC Diagnoses   Final diagnoses:  Allergic reaction, initial encounter   Discharge Instructions   None    ED Prescriptions    Medication Sig Dispense Auth. Provider   predniSONE (STERAPRED UNI-PAK 21 TAB) 10 MG (21) TBPK tablet Dispense one 6 day pack. Take as directed with food. 21 tablet Eulogia Dismore, Myrene Galas, MD   hydrOXYzine (ATARAX/VISTARIL) 25 MG tablet Take 1 tablet (25 mg total) by mouth every 6 (six) hours as needed for itching. 21 tablet Jerline Linzy, Myrene Galas, MD     PDMP not reviewed this encounter.   Chase Picket, MD 12/18/19 1700

## 2020-02-27 ENCOUNTER — Ambulatory Visit (HOSPITAL_COMMUNITY)
Admission: EM | Admit: 2020-02-27 | Discharge: 2020-02-27 | Disposition: A | Discharge status: Home / Self Care | Payer: Medicare (Managed Care) | Attending: Emergency Medicine | Admitting: Emergency Medicine

## 2020-02-27 ENCOUNTER — Other Ambulatory Visit: Payer: Self-pay

## 2020-02-27 ENCOUNTER — Encounter (HOSPITAL_COMMUNITY): Payer: Self-pay | Admitting: *Deleted

## 2020-02-27 DIAGNOSIS — L509 Urticaria, unspecified: Secondary | ICD-10-CM | POA: Diagnosis not present

## 2020-02-27 HISTORY — DX: Other urticaria: L50.8

## 2020-02-27 MED ORDER — METHYLPREDNISOLONE SODIUM SUCC 125 MG IJ SOLR
125.0000 mg | Freq: Once | INTRAMUSCULAR | Status: AC
  Administered 2020-02-27: 125 mg via INTRAMUSCULAR

## 2020-02-27 MED ORDER — PREDNISONE 10 MG (21) PO TBPK: ORAL_TABLET | ORAL | 0 refills | Status: DC

## 2020-02-27 MED ORDER — FAMOTIDINE 20 MG PO TABS: 20.0000 mg | ORAL_TABLET | Freq: Two times a day (BID) | ORAL | 0 refills | Status: DC

## 2020-02-27 MED ORDER — METHYLPREDNISOLONE SODIUM SUCC 125 MG IJ SOLR
INTRAMUSCULAR | Status: AC
  Filled 2020-02-27: qty 2

## 2020-02-27 MED ORDER — HYDROXYZINE HCL 25 MG PO TABS: ORAL_TABLET | ORAL | 0 refills | Status: DC

## 2020-02-27 NOTE — Discharge Instructions (Addendum)
You have severe hives, but it is not blocking your airway or causing any breathing problems. We are giving you a shot of steroid called Depo-Medrol as immediate treatment. Prescription sent to your pharmacy for prednisone 6-day Dosepak, hydroxyzine every 4-6 hours as needed for itch or hives, and Pepcid/famotidine twice a day.  You need to follow-up with your allergist within 3 to 4 days.  If any severe worsening symptoms such as shortness of breath or problems swallowing, use her EpiPen immediately and call 911 and go to emergency room.

## 2020-02-27 NOTE — ED Notes (Signed)
Lina Sar, PA notified of pt c/o.

## 2020-02-27 NOTE — ED Triage Notes (Signed)
Reports waking this AM with urticaria, sensation of tingling throat and uncomfortable swallowing.  Denies any SOB or oral swelling.  Unk trigger; states has allergist and was diagnosed with chronic urticaria.  Has not taken Benadryl or used epi pen.

## 2020-03-02 NOTE — ED Provider Notes (Addendum)
[facility]    CSN: [id number] Arrival date & time: 02/27/20  1003      History   Chief Complaint Chief Complaint  Patient presents with  . Allergic Reaction    HPI Crystal Mora is a 44 y.o. female.   HPI Reports waking this AM with very pruritic urticaria on extremities and trunk, associated with sensation of tingling throat and uncomfortable swallowing.  Denies any SOB or facial or oral or lip swelling. Unknown trigger; does not recall any insect bite new detergent or soap or any known allergens. States has allergist and was diagnosed with chronic urticaria.   Has not taken Benadryl or used epi pen. Denies fever, chills, chest pain, shortness of breath , wheezing, abdominal pain, nausea, vomiting, diarrhea. No palpitations. No syncope or focal neurologic symptoms. No known exposure to anyone with Covid.  Pertinent items noted in HPI and remainder of comprehensive ROS otherwise negative.  Denies chance of pregnancy, had BTL in the past.  Past Medical History:  Diagnosis Date  . Anxiety   . Bipolar 1 disorder ([place])   . Chronic urticaria   . Depression   . Hypertension    states she has not took BP medication for 2 years  . Insomnia   . 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 Thyroid disease     There are no problems to display for this patient.   Past Surgical History:  Procedure Laterality Date  . CESAREAN SECTION    . THYROID SURGERY    . TUBAL LIGATION    . tubal reversal Bilateral     OB History    Gravida  14   Para  8   Term  3   Preterm  5   AB  3   Living  8     SAB  1   TAB  2   Ectopic  0   Multiple  1   Live Births  8            Home Medications    Prior to Admission medications   Medication Sig Start Date End Date Taking? Authorizing Provider  aspirin EC 81 MG tablet Take 81 mg by mouth daily.   Yes [provider]  atorvastatin (LIPITOR) 20 MG tablet Take 20 mg by mouth daily.   Yes [provider]  cetirizine (ZYRTEC) 10 MG tablet Take 10 mg by mouth daily.   Yes [provider]  cyclobenzaprine (FLEXERIL) 10 MG tablet Take 1 tablet (10 mg total) by mouth every 8 (eight) hours as needed for muscle spasms. 10/19/16  Yes Starr Lake, CNM  diclofenac (VOLTAREN) 75 MG EC tablet Take 75 mg by mouth 2 (two) times daily.   Yes [provider]  DULoxetine (CYMBALTA) 60 MG capsule Take 60 mg by mouth daily.   Yes [provider]  losartan (COZAAR) 100 MG tablet Take 100 mg by mouth daily.   Yes [provider]  omalizumab Arvid Right) 150 MG/ML prefilled syringe Inject into the skin every 14 (fourteen) days.   Yes [provider]  sertraline (ZOLOFT) 25 MG tablet Take 25 mg by mouth at bedtime. 09/16/16  Yes [provider]  tiZANidine (ZANAFLEX) 4 MG capsule Take 4 mg by mouth 3 (three) times daily.   Yes [provider]  Aspirin-Salicylamide-Caffeine (BC HEADACHE POWDER PO) Take 1 Package by mouth daily as needed (headache).    [provider]  diphenhydrAMINE (BENADRYL) 50  MG capsule Take 50 mg by mouth every 6 (six) hours as needed.    [provider]  EPINEPHrine (EPIPEN 2-PAK) 0.3 mg/0.3 mL IJ SOAJ injection Inject 0.3 mLs (0.3 mg total) into the muscle once as needed for up to 1 dose (for severe allergic reaction). CAll 911 immediately if you have to use this medicine 09/24/18   Larene Pickett, PA-C  famotidine (PEPCID) 20 MG tablet Take 1 tablet (20 mg total) by mouth 2 (two) times daily. 02/27/20   Jacqulyn Cane, MD  hydrOXYzine (ATARAX/VISTARIL) 25 MG tablet Take 1 every 4-6 hours as needed for itch or hives. (Caution: May cause drowsiness) 02/27/20   Jacqulyn Cane, MD  predniSONE (STERAPRED UNI-PAK 21 TAB) 10 MG (21) TBPK tablet Take as directed for 6 days.--Take 6 on day 1, 5 on day 2, 4 on day 3, then 3 tablets on day 4, then 2 tablets on day 5, then 1 on day 6. 02/27/20   Jacqulyn Cane, MD    ranitidine (ZANTAC) 150 MG capsule Take 150 mg by mouth 2 (two) times daily.    [provider]  rizatriptan (MAXALT) 10 MG tablet Take 10 mg by mouth as needed for migraine. May repeat in 2 hours if needed    [provider]    Family History Family History  Problem Relation Age of Onset  . Depression Mother   . Mental illness Mother   . Asthma Mother   . COPD Mother   . Diabetes Mother   . Mental illness Brother   . Mental illness Daughter   . Diabetes Maternal Aunt   . Mental illness Maternal Aunt   . Depression Maternal Aunt   . Diabetes Maternal Grandmother     Social History Social History   Tobacco Use  . Smoking status: Current Every Day Smoker    Packs/day: 0.50    Types: Cigars  . Smokeless tobacco: Never Used  . Tobacco comment: 1 cigar daily  Vaping Use  . Vaping Use: Never used  Substance Use Topics  . Alcohol use: Yes    Comment: Rare  . Drug use: No     Allergies   Biaxin [clarithromycin], Morphine and related, Penicillins, Percocet [oxycodone-acetaminophen], Shrimp [shellfish allergy], Citalopram, and Topiramate   Review of Systems Review of Systems  Pertinent items noted in HPI and remainder of comprehensive ROS otherwise negative.  Physical Exam Triage Vital Signs ED Triage Vitals  Enc Vitals Group     BP 02/27/20 1010 128/78     Pulse Rate 02/27/20 1010 (!) 109     Resp 02/27/20 1010 16     Temp 02/27/20 1010 98.9 F (37.2 C)     Temp src --      SpO2 02/27/20 1010 96 %     Weight --      Height --      Head Circumference --      Peak Flow --      Pain Score 02/27/20 1012 9     Pain Loc --      Pain Edu? --      Excl. in Person? --    No data found.  Updated Vital Signs BP 128/78   Pulse (!) 109   Temp 98.9 F (37.2 C)   Resp 16   LMP 02/13/2020 (Approximate)   SpO2 96%    Physical Exam Vitals reviewed.  Constitutional:      General: She is not in acute distress.  Appearance: She is well-developed.  She is not ill-appearing, toxic-appearing or diaphoretic.     Comments: Appears very uncomfortable from diffuse pruritic hives on upper and lower extremities and trunk. No facial or lip swelling. No acute cardiorespiratory distress. O2 saturation 96% on room air. Respirations 16. I rechecked pulse, 92 and regular.  HENT:     Head: Normocephalic and atraumatic.     Mouth/Throat:     Mouth: Mucous membranes are moist.     Pharynx: Oropharynx is clear. No oropharyngeal exudate or posterior oropharyngeal erythema.     Comments: No posterior pharyngeal edema. Airway intact. Eyes:     General: No scleral icterus.    Conjunctiva/sclera: Conjunctivae normal.     Pupils: Pupils are equal, round, and reactive to light.  Cardiovascular:     Rate and Rhythm: Normal rate and regular rhythm.     Heart sounds: No murmur heard.   Pulmonary:     Effort: Pulmonary effort is normal. No respiratory distress.     Breath sounds: Normal breath sounds. No stridor. No wheezing, rhonchi or rales.  Abdominal:     General: There is no distension.  Musculoskeletal:        General: No tenderness.     Cervical back: Normal range of motion and neck supple.     Right lower leg: No edema.     Left lower leg: No edema.  Skin:    General: Skin is warm and dry.     Capillary Refill: Capillary refill takes less than 2 seconds.     Coloration: Skin is not jaundiced.     Comments: Diffuse blanching raised urticaria, upper and lower extremities and trunk. Face is spared.  Neurological:     Mental Status: She is alert and oriented to person, place, and time.     Cranial Nerves: No cranial nerve deficit.  Psychiatric:        Behavior: Behavior normal.      UC Treatments / Results  Labs (all labs ordered are listed, but only abnormal results are displayed) Labs Reviewed - No data to display  EKG   Radiology No results found.  Procedures Procedures (including critical care time)  Medications Ordered in  UC Medications  methylPREDNISolone sodium succinate (SOLU-MEDROL) 125 mg/2 mL injection 125 mg (125 mg Intramuscular Given 02/27/20 1035)    Initial Impression / Assessment and Plan / UC Course  I have reviewed the triage vital signs and the nursing notes.  Pertinent labs & imaging results that were available during my care of the patient were reviewed by me and considered in my medical decision making (see chart for details).      Final Clinical Impressions(s) / UC Diagnoses   Final diagnoses:  Urticaria  No evidence of airway or respiratory compromise, but severe pruritic symptoms of urticaria. After risk benefits alternatives discussed, gave Solu-Medrol 125 mg IM stat. Oral prescriptions for prednisone Dosepak, hydroxyzine as needed for itch, and advised Pepcid twice daily. She already has an EpiPen, but we reviewed circumstances to use the EpiPen. Red flags discussed and what to do if any red flags.-See also discharge instructions below. AVS printed and given to patient and questions invited and answered.  Precautions discussed. Red flags discussed. Questions invited and answered. Patient voiced understanding and agreement.    Discharge Instructions     You have severe hives, but it is not blocking your airway or causing any breathing problems. We are giving you a shot of steroid called Depo-Medrol as  immediate treatment. Prescription sent to your pharmacy for prednisone 6-day Dosepak, hydroxyzine every 4-6 hours as needed for itch or hives, and Pepcid/famotidine twice a day.  You need to follow-up with your allergist within 3 to 4 days.  If any severe worsening symptoms such as shortness of breath or problems swallowing, use her EpiPen immediately and call 911 and go to emergency room.    ED Prescriptions    Medication Sig Dispense Auth. Provider   famotidine (PEPCID) 20 MG tablet Take 1 tablet (20 mg total) by mouth 2 (two) times daily. 30 tablet Jacqulyn Cane, MD    predniSONE (STERAPRED UNI-PAK 21 TAB) 10 MG (21) TBPK tablet Take as directed for 6 days.--Take 6 on day 1, 5 on day 2, 4 on day 3, then 3 tablets on day 4, then 2 tablets on day 5, then 1 on day 6. 21 tablet Jacqulyn Cane, MD   hydrOXYzine (ATARAX/VISTARIL) 25 MG tablet Take 1 every 4-6 hours as needed for itch or hives. (Caution: May cause drowsiness) 60 tablet Jacqulyn Cane, MD     PDMP not reviewed this encounter.   Jacqulyn Cane, MD 03/02/20 1659    Jacqulyn Cane, MD 03/06/20 [phone]

## 2020-03-16 ENCOUNTER — Other Ambulatory Visit: Payer: Self-pay

## 2020-03-16 ENCOUNTER — Ambulatory Visit (HOSPITAL_COMMUNITY)
Admission: EM | Admit: 2020-03-16 | Discharge: 2020-03-16 | Disposition: A | Discharge status: Home / Self Care | Payer: Medicare (Managed Care) | Attending: Family Medicine | Admitting: Family Medicine

## 2020-03-16 ENCOUNTER — Encounter (HOSPITAL_COMMUNITY): Payer: Self-pay

## 2020-03-16 DIAGNOSIS — L509 Urticaria, unspecified: Secondary | ICD-10-CM | POA: Diagnosis not present

## 2020-03-16 DIAGNOSIS — T7840XA Allergy, unspecified, initial encounter: Secondary | ICD-10-CM

## 2020-03-16 MED ORDER — DEXAMETHASONE SODIUM PHOSPHATE 10 MG/ML IJ SOLN
10.0000 mg | Freq: Once | INTRAMUSCULAR | Status: AC
  Administered 2020-03-16: 10 mg via INTRAMUSCULAR

## 2020-03-16 MED ORDER — DEXAMETHASONE SODIUM PHOSPHATE 10 MG/ML IJ SOLN
INTRAMUSCULAR | Status: AC
  Filled 2020-03-16: qty 1

## 2020-03-16 MED ORDER — PREDNISONE 10 MG (48) PO TBPK: ORAL_TABLET | ORAL | 0 refills | Status: DC

## 2020-03-16 NOTE — ED Triage Notes (Signed)
Pt presents with swollen neck, swollen throat,  Urticaria since this morning. Pt states this happens every time the prednisone is out of her system. Denies difficulty breathing.

## 2020-03-16 NOTE — Discharge Instructions (Signed)
Meds ordered this encounter  Medications   dexamethasone (DECADRON) injection 10 mg   predniSONE (STERAPRED UNI-PAK 48 TAB) 10 MG (48) TBPK tablet    Sig: Take as directed.    Dispense:  48 tablet    Refill:  0

## 2020-03-16 NOTE — ED Provider Notes (Signed)
[name]   [id number] 03/16/20 Arrival Time: 7096  ASSESSMENT & PLAN:  1. Urticaria   2. Allergic reaction, initial encounter     Meds ordered this encounter  Medications  . dexamethasone (DECADRON) injection 10 mg  . predniSONE (STERAPRED UNI-PAK 48 TAB) 10 MG (48) TBPK tablet    Sig: Take as directed.    Dispense:  48 tablet    Refill:  0    Agrees to ED eval should symptoms worsens.  Reviewed expectations re: course of current medical issues. Questions answered. Outlined signs and symptoms indicating need for more acute intervention. Patient verbalized understanding. After Visit Summary given.   SUBJECTIVE: History from: patient. Seen here 02/27/2020 for similar/same symptoms. Was improving. Sees allergist and has f/u appt tomorrow.  Crystal Mora is a 44 y.o. female who presents for evaluation of "allergic reaction" with unknown trigger. Manifests as hives with "throat feeling a little swollen"; no respiratory or swallowing difficulties. Afebrile. Has EpiPen but has not used. Past day with worsening hives; itching. No new exposures reported. Tolerating PO fluids without n/v.  OBJECTIVE:  Vitals:   03/16/20 1032  BP: (!) 136/98  Pulse: (!) 110  Resp: 20  Temp: 97.9 F (36.6 C)  TempSrc: Oral  SpO2: 100%    General appearance: alert; no distress but appears anxious Eyes: PERRLA; EOMI; conjunctiva normal HENT: normocephalic; atraumatic; tongue normal; nasal mucosa normal; oral mucosa normal Neck: supple  Lungs: clear to auscultation bilaterally; speaks full sentences without difficulty Heart: reg; slight tachycardia Back: no CVA tenderness Extremities: no cyanosis or edema; symmetrical with no gross deformities Skin: warm and dry; smooth, slightly elevated and erythematous plaques of variable size over her trunk and extremities Psychological: alert and cooperative; normal mood and affect    Allergies  Allergen Reactions  . Biaxin  [Clarithromycin]   . Morphine And Related   . Penicillins Hives, Itching and Swelling    Has patient had a PCN reaction causing immediate rash, facial/tongue/throat swelling, SOB or lightheadedness with hypotension: No Has patient had a PCN reaction causing severe rash involving mucus membranes or skin necrosis: No Has patient had a PCN reaction that required hospitalization No Has patient had a PCN reaction occurring within the last 10 years: Yes If all of the above answers are "NO", then may proceed with Cephalosporin use.   Crystal Mora Percocet [Oxycodone-Acetaminophen] Hives and Itching  . Shrimp [Shellfish Allergy]   . Citalopram Anxiety    aggression  . Topiramate Rash    Past Medical History:  Diagnosis Date  . Anxiety   . Bipolar 1 disorder ([place])   . Chronic urticaria   . Depression   . Hypertension    states she has not took BP medication for 2 years  . Insomnia   . 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 Thyroid disease    Social History   Socioeconomic History  . Marital status: Divorced    Spouse name: Not on file  . Number of children: Not on file  . Years of education: Not on file  . Highest education level: Not on file  Occupational History  . Not on file  Tobacco Use  . Smoking status: Current Every Day Smoker    Packs/day: 0.50    Types: Cigars  . Smokeless tobacco: Never Used  . Tobacco comment: 1 cigar daily  Vaping Use  . Vaping Use: Never used  Substance and Sexual Activity  . Alcohol use: Yes  Comment: Rare  . Drug use: No  . Sexual activity: Yes    Birth control/protection: Surgical  Other Topics Concern  . Not on file  Social History Narrative  . Not on file   Social Determinants of Health   Financial Resource Strain:   . Difficulty of Paying Living Expenses:   Food Insecurity:   . Worried About Charity fundraiser in the Last Year:   . Arboriculturist in the Last Year:   Transportation Needs:   . Film/video editor (Medical):   Crystal Mora  Lack of Transportation (Non-Medical):   Physical Activity:   . Days of Exercise per Week:   . Minutes of Exercise per Session:   Stress:   . Feeling of Stress :   Social Connections:   . Frequency of Communication with Friends and Family:   . Frequency of Social Gatherings with Friends and Family:   . Attends Religious Services:   . Active Member of Clubs or Organizations:   . Attends Archivist Meetings:   Crystal Mora Marital Status:   Intimate Partner Violence:   . Fear of Current or Ex-Partner:   . Emotionally Abused:   Crystal Mora Physically Abused:   . Sexually Abused:    Family History  Problem Relation Age of Onset  . Depression Mother   . Mental illness Mother   . Asthma Mother   . COPD Mother   . Diabetes Mother   . Mental illness Brother   . Mental illness Daughter   . Diabetes Maternal Aunt   . Mental illness Maternal Aunt   . Depression Maternal Aunt   . Diabetes Maternal Grandmother    Past Surgical History:  Procedure Laterality Date  . CESAREAN SECTION    . THYROID SURGERY    . TUBAL LIGATION    . tubal reversal Bilateral      Vanessa Kick, MD 03/16/20 [phone]

## 2020-03-30 DIAGNOSIS — F411 Generalized anxiety disorder: Secondary | ICD-10-CM | POA: Insufficient documentation

## 2020-03-30 DIAGNOSIS — F3189 Other bipolar disorder: Secondary | ICD-10-CM | POA: Insufficient documentation

## 2020-03-30 DIAGNOSIS — F3181 Bipolar II disorder: Secondary | ICD-10-CM | POA: Insufficient documentation

## 2020-10-10 DIAGNOSIS — M47816 Spondylosis without myelopathy or radiculopathy, lumbar region: Secondary | ICD-10-CM | POA: Insufficient documentation

## 2021-01-05 ENCOUNTER — Encounter: Payer: Self-pay | Admitting: *Deleted

## 2021-07-26 DIAGNOSIS — M7918 Myalgia, other site: Secondary | ICD-10-CM | POA: Insufficient documentation

## 2021-12-06 DIAGNOSIS — M7670 Peroneal tendinitis, unspecified leg: Secondary | ICD-10-CM | POA: Insufficient documentation

## 2021-12-07 ENCOUNTER — Encounter: Payer: Self-pay | Admitting: Podiatry

## 2021-12-07 ENCOUNTER — Ambulatory Visit (INDEPENDENT_AMBULATORY_CARE_PROVIDER_SITE_OTHER): Payer: Medicare Other | Admitting: Podiatry

## 2021-12-07 DIAGNOSIS — G629 Polyneuropathy, unspecified: Secondary | ICD-10-CM | POA: Diagnosis not present

## 2021-12-07 DIAGNOSIS — M5417 Radiculopathy, lumbosacral region: Secondary | ICD-10-CM | POA: Diagnosis not present

## 2021-12-07 NOTE — Patient Instructions (Signed)
Call [phone] to schedule your consultation with [facility]  ?

## 2021-12-07 NOTE — Progress Notes (Signed)
?  Subjective:  ?Patient ID: Crystal Mora, female    DOB: February 05, 1976,  MRN: [id number] ? ?Chief Complaint  ?Patient presents with  ? Flat Foot  ? ? ?46 y.o. female presents with the above complaint. History confirmed with patient.  She was referred for evaluation of tendinitis.  She saw [facility] yesterday and they discussed her she likely has tendinitis.  They recommended orthotics as well as physical therapy.  She has a history of lumbar back pathology she sees pain management for this and has had a history of injections or ablations.  Reportedly x-rays were negative for fracture or arthritis ? ?Objective:  ?Physical Exam: ?warm, good capillary refill, no trophic changes or ulcerative lesions, normal DP and PT pulses, and she is altered sensation with a positive Tinel's sign over the tarsal tunnel which radiates into the foot as well as the superficial peroneal nerve at the lower leg that radiates into the ankle and foot as well, no pain over the peroneal tendons in the retromalleolar groove to the insertion of the fifth metatarsal, she has good 5 out of 5 strength in all planes without pain. ? ? ?Assessment:  ? ?1. Lumbosacral radiculopathy   ?2. Peripheral neuritis   ? ? ? ?Plan:  ?Patient was evaluated and treated and all questions answered. ? ?I discussed my clinical findings with the patient I discussed with her that I do not think she likely has tendinitis but I do think physical therapy and orthotics may be helpful for this for mobility.  I think likely this is secondary to her lumbar back pathology and this has been worsening.  She says its been sometime since she had an MRI.  And does not feel like her back is getting better and requested a second opinion.  I recommended referral to [place] [facility] and this referral was sent and I gave her their number to schedule her follow-up appointment with them.  I will see her back as needed.  She currently takes gabapentin nightly and discussed  with her she can take this during the day to to see if this helps alleviate her pain. ? ?Return if symptoms worsen or fail to improve.  ? ?

## 2021-12-16 ENCOUNTER — Other Ambulatory Visit: Payer: Self-pay

## 2021-12-16 ENCOUNTER — Emergency Department (HOSPITAL_COMMUNITY)
Admission: EM | Admit: 2021-12-16 | Discharge: 2021-12-16 | Disposition: A | Discharge status: Home / Self Care | Payer: Medicare Other | Attending: Emergency Medicine | Admitting: Emergency Medicine

## 2021-12-16 ENCOUNTER — Encounter (HOSPITAL_COMMUNITY): Payer: Self-pay

## 2021-12-16 DIAGNOSIS — E119 Type 2 diabetes mellitus without complications: Secondary | ICD-10-CM | POA: Insufficient documentation

## 2021-12-16 DIAGNOSIS — M5416 Radiculopathy, lumbar region: Secondary | ICD-10-CM | POA: Diagnosis not present

## 2021-12-16 DIAGNOSIS — Z7982 Long term (current) use of aspirin: Secondary | ICD-10-CM | POA: Diagnosis not present

## 2021-12-16 DIAGNOSIS — Z7984 Long term (current) use of oral hypoglycemic drugs: Secondary | ICD-10-CM | POA: Insufficient documentation

## 2021-12-16 DIAGNOSIS — M549 Dorsalgia, unspecified: Secondary | ICD-10-CM | POA: Diagnosis present

## 2021-12-16 MED ORDER — KETOROLAC TROMETHAMINE 15 MG/ML IJ SOLN
15.0000 mg | Freq: Once | INTRAMUSCULAR | Status: AC
  Administered 2021-12-16: 15 mg via INTRAMUSCULAR
  Filled 2021-12-16: qty 1

## 2021-12-16 MED ORDER — PREDNISONE 20 MG PO TABS: 40.0000 mg | ORAL_TABLET | Freq: Every day | ORAL | 0 refills | Status: AC

## 2021-12-16 MED ORDER — DEXAMETHASONE SODIUM PHOSPHATE 10 MG/ML IJ SOLN
10.0000 mg | Freq: Once | INTRAMUSCULAR | Status: AC
  Administered 2021-12-16: 10 mg via INTRAMUSCULAR
  Filled 2021-12-16: qty 1

## 2021-12-16 MED ORDER — LIDOCAINE 5 % EX PTCH: 1.0000 | MEDICATED_PATCH | CUTANEOUS | 0 refills | Status: DC

## 2021-12-16 MED ORDER — METHOCARBAMOL 500 MG PO TABS
500.0000 mg | ORAL_TABLET | Freq: Once | ORAL | Status: AC
  Administered 2021-12-16: 500 mg via ORAL
  Filled 2021-12-16: qty 1

## 2021-12-16 MED ORDER — LIDOCAINE 5 % EX PTCH
1.0000 | MEDICATED_PATCH | Freq: Once | CUTANEOUS | Status: DC
  Administered 2021-12-16: 1 via TRANSDERMAL
  Filled 2021-12-16: qty 1

## 2021-12-16 MED ORDER — METHOCARBAMOL 500 MG PO TABS: 500.0000 mg | ORAL_TABLET | Freq: Two times a day (BID) | ORAL | 0 refills | Status: DC

## 2021-12-16 MED ORDER — HYDROCODONE-ACETAMINOPHEN 5-325 MG PO TABS
1.0000 | ORAL_TABLET | Freq: Once | ORAL | Status: AC
  Administered 2021-12-16: 1 via ORAL
  Filled 2021-12-16: qty 1

## 2021-12-16 NOTE — ED Triage Notes (Signed)
Patient said she woke up with back pain (whole back). Has sciatica, shooting pain down both legs. Said she has tried a Engineer, materials all day. When she stands up she lost her strength so she fell back down today.Took hydrocodone at 7am and 12pm along with valium.  ?

## 2021-12-16 NOTE — ED Provider Notes (Signed)
[facility] DEPT Provider Note   CSN: [id number] Arrival date & time: 12/16/21  1938     History  Chief Complaint  Patient presents with   Back Pain    Crystal Mora is a 46 y.o. female with a past medical history of sciatica, diabetes, who presents to the emergency department complaint of chronic back pain onset 2-3 weeks.  Patient notes she has been evaluated by her pain management specialist at [facility].  She has an appointment with [facility] on 12/28/2021.  Has tried [facility] powder for her symptoms.  Denies bowel/bladder incontinence, dysuria, fever, numbness, tingling, dysuria, hematuria, fever, chills, nausea, vomiting.   The history is provided by the patient. No language interpreter was used.      Home Medications Prior to Admission medications   Medication Sig Start Date End Date Taking? Authorizing Provider  lidocaine (LIDODERM) 5 % Place 1 patch onto the skin daily. Remove & Discard patch within 12 hours or as directed by MD 12/16/21  Yes Meerab Maselli A, PA-C  methocarbamol (ROBAXIN) 500 MG tablet Take 1 tablet (500 mg total) by mouth 2 (two) times daily. 12/16/21  Yes Oluwakemi Salsberry A, PA-C  predniSONE (DELTASONE) 20 MG tablet Take 2 tablets (40 mg total) by mouth daily for 5 days. 12/16/21 12/21/21 Yes Samadhi Mahurin A, PA-C  aspirin EC 81 MG tablet Take 81 mg by mouth daily.    [provider]  Aspirin-Salicylamide-Caffeine (BC HEADACHE POWDER PO) Take 1 Package by mouth daily as needed (headache).    [provider]  atorvastatin (LIPITOR) 20 MG tablet Take 20 mg by mouth daily.    [provider]  celecoxib (CELEBREX) 100 MG capsule celecoxib 100 mg capsule 12/04/21   [provider]  cetirizine (ZYRTEC) 10 MG tablet Take 10 mg by mouth daily.    [provider]  cyclobenzaprine (FLEXERIL) 10 MG tablet Take 1 tablet (10 mg total) by mouth every 8 (eight) hours as needed for muscle  spasms. 10/19/16   Starr Lake, CNM  diazepam (VALIUM) 5 MG tablet diazepam 5 mg tablet    [provider]  diclofenac (VOLTAREN) 75 MG EC tablet Take 75 mg by mouth 2 (two) times daily.    [provider]  diphenhydrAMINE (BENADRYL) 50 MG capsule Take 50 mg by mouth every 6 (six) hours as needed.    [provider]  doxycycline (VIBRA-TABS) 100 MG tablet doxycycline hyclate 100 mg tablet  TAKE 1 TAB TWICE DAILY WITH FULL MEAL.    [provider]  DULoxetine (CYMBALTA) 60 MG capsule Take 60 mg by mouth daily.    [provider]  EPINEPHrine (EPIPEN 2-PAK) 0.3 mg/0.3 mL IJ SOAJ injection Inject 0.3 mLs (0.3 mg total) into the muscle once as needed for up to 1 dose (for severe allergic reaction). CAll 911 immediately if you have to use this medicine 09/24/18   [facility], Vilinda Blanks, PA-C  estradiol (CLIMARA - DOSED IN MG/24 HR) 0.05 mg/24hr patch APPLY 1 PATCH ONCE A WEEK 11/14/21   [provider]  famotidine (PEPCID) 20 MG tablet Take 1 tablet (20 mg total) by mouth 2 (two) times daily. 02/27/20   Jacqulyn Cane, MD  fluconazole (DIFLUCAN) 200 MG tablet fluconazole 200 mg tablet  TAKE A TABLET BY MOUTH ONCE WEEKLY FOR 3 WEEKS. 06/09/21   [provider]  gabapentin (NEURONTIN) 300 MG capsule gabapentin 300 mg capsule 01/16/21   [provider]  G[place]n PEN  80 MG/0.8ML PNKT Inject into the skin. 12/04/21   [provider]  HYDROcodone-acetaminophen (NORCO/VICODIN) 5-325 MG tablet Take 0.5-1 tablets by mouth 2 (two) times daily. 12/02/21   [provider]  hydroxychloroquine (PLAQUENIL) 200 MG tablet hydroxychloroquine 200 mg tablet 12/05/21   [provider]  hydrOXYzine (ATARAX/VISTARIL) 25 MG tablet Take 1 every 4-6 hours as needed for itch or hives. (Caution: May cause drowsiness) 02/27/20   Jacqulyn Cane, MD  losartan (COZAAR) 100 MG tablet Take 1 tablet by mouth daily. 11/07/21   [provider]   metFORMIN (GLUCOPHAGE) 500 MG tablet metformin 500 mg tablet  TAKE 1 TABLET BY MOUTH TWICE DAILY    [provider]  montelukast (SINGULAIR) 10 MG tablet Take by mouth. 12/17/16   [provider]  omalizumab Arvid Right) 150 MG/ML prefilled syringe Inject into the skin every 14 (fourteen) days.    [provider]  progesterone (PROMETRIUM) 100 MG capsule progesterone micronized 100 mg capsule  TAKE 1 CAPSULE BY MOUTH EVERY DAY NIGHTLY 02/09/21   [provider]  ranitidine (ZANTAC) 150 MG capsule Take 150 mg by mouth 2 (two) times daily.    [provider]  rizatriptan (MAXALT) 10 MG tablet Take 10 mg by mouth as needed for migraine. May repeat in 2 hours if needed    [provider]  rosuvastatin (CRESTOR) 5 MG tablet Take 1 tablet by mouth daily. 10/13/21   [provider]  sertraline (ZOLOFT) 25 MG tablet Take 25 mg by mouth at bedtime. 09/16/16   [provider]  sertraline (ZOLOFT) 50 MG tablet Take by mouth. 01/06/17   [provider]  spironolactone (ALDACTONE) 50 MG tablet spironolactone 50 mg tablet  TAKE 1 TABLET DAILY FOR 2 WEEKS, IF TOLERATED INCREASE TO 2 TABS DAILY 06/09/21   [provider]  tiZANidine (ZANAFLEX) 4 MG capsule Take 4 mg by mouth 3 (three) times daily.    [provider]  traMADol (ULTRAM) 50 MG tablet tramadol 50 mg tablet  TAKE 1 TABLET (50 MG TOTAL) BY MOUTH EVERY 8 HOURS AS NEEDED FOR UP TO 5 DAYS FOR MODERATE PAIN(4-6)    [provider]      Allergies    Biaxin [clarithromycin], Morphine and related, Penicillins, Percocet [oxycodone-acetaminophen], Shrimp [shellfish allergy], Citalopram, and Topiramate    Review of Systems   Review of Systems  Constitutional:  Negative for chills and fever.  Gastrointestinal:  Negative for nausea and vomiting.  Genitourinary:  Negative for dysuria and hematuria.  Musculoskeletal:  Positive for back pain.  Neurological:   Negative for numbness.       -tingling  All other systems reviewed and are negative.  Physical Exam Updated Vital Signs BP (!) 134/93 (BP Location: Left Arm)   Pulse (!) 104   Temp 98.1 F (36.7 C) (Oral)   Resp 19   Ht 5\' 6"  (1.676 m)   Wt 74.8 kg   SpO2 99%   BMI 26.63 kg/m  Physical Exam Vitals and nursing note reviewed.  Constitutional:      General: She is not in acute distress.    Appearance: She is not diaphoretic.  HENT:     Head: Normocephalic and atraumatic.     Mouth/Throat:     Pharynx: No oropharyngeal exudate.  Eyes:     General: No scleral icterus.    Conjunctiva/sclera: Conjunctivae normal.  Cardiovascular:     Rate and Rhythm: Normal rate and regular rhythm.     Pulses: Normal  pulses.     Heart sounds: Normal heart sounds.  Pulmonary:     Effort: Pulmonary effort is normal. No respiratory distress.     Breath sounds: Normal breath sounds. No wheezing.  Abdominal:     General: Bowel sounds are normal.     Palpations: Abdomen is soft. There is no mass.     Tenderness: There is no abdominal tenderness. There is no guarding or rebound.  Musculoskeletal:        General: Normal range of motion.     Cervical back: Normal range of motion and neck supple.     Comments: No spinal TTP. TTP along musculature of back, notably left lumbar and right thoracic regions. No overlying skin changes, deformities, or erythema. Positive SLR bilaterally.  Skin:    General: Skin is warm and dry.  Neurological:     Mental Status: She is alert.  Psychiatric:        Behavior: Behavior normal.    ED Results / Procedures / Treatments   Labs (all labs ordered are listed, but only abnormal results are displayed) Labs Reviewed - No data to display  EKG None  Radiology No results found.  Procedures Procedures    Medications Ordered in ED Medications  ketorolac (TORADOL) 15 MG/ML injection 15 mg (15 mg Intramuscular Given 12/16/21 2039)  dexamethasone (DECADRON)  injection 10 mg (10 mg Intramuscular Given 12/16/21 2040)  methocarbamol (ROBAXIN) tablet 500 mg (500 mg Oral Given 12/16/21 2036)  HYDROcodone-acetaminophen (NORCO/VICODIN) 5-325 MG per tablet 1 tablet (1 tablet Oral Given 12/16/21 2035)    ED Course/ Medical Decision Making/ A&P Clinical Course as of 12/17/21 2255  Sat Dec 16, 2021  2120 Patient reevaluated and noted improvement of her symptoms with treatment regimen in the ED. [SB]    Clinical Course User Index [SB] Alysha Doolan A, PA-C                           Medical Decision Making Risk Prescription drug management.   Patient with bilateral lumbar back pain with radiation down posterior legs. Pt with history of sciatica.  No loss of bowel or bladder control.  No concern for cauda equina.  No urinary symptoms suggestive of UTI.  Vital signs stable, patient afebrile. On exam, patient with no spinal TTP. TTP along musculature of back, notably left lumbar and right thoracic regions. No overlying skin changes, deformities, or erythema. Positive SLR bilaterally.  No concerning findings on exams. Differential diagnosis includes but is not limited to fracture, herniation, muscle strain, cauda equina, pancreatitis, appendicitis, pyelonephritis, nephrolithiasis.    Medications:  I ordered medication including Decadron, Toradol, Robaxin, Norco, Lidoderm patch for pain management. Reevaluation of the patient after these medicines and interventions, I reevaluated the patient and found that they have improved I have reviewed the patients home medicines and have made adjustments as needed   Disposition: Presentation suspicious for chronic lumbar radiculopathy.  Doubt fracture, dislocation, herniation.  After consideration of the diagnostic results and the patients response to treatment, I feel that the patient would benefit from Discharge home.  We will send patient home with prescription for Robaxin, prednisone, Lidoderm patch.  Discussed importance  of follow-up with primary care provider for management.  Supportive care measures and strict return precautions discussed with patient at bedside. Pt acknowledges and verbalizes understanding. Pt appears safe for discharge. Follow up as indicated in discharge paperwork.    This chart was dictated using voice recognition  software, Diplomatic Services operational officer. Despite the best efforts of this provider to proofread and correct errors, errors may still occur which can change documentation meaning.  Final Clinical Impression(s) / ED Diagnoses Final diagnoses:  Lumbar radiculopathy    Rx / DC Orders ED Discharge Orders          Ordered    methocarbamol (ROBAXIN) 500 MG tablet  2 times daily        12/16/21 2120    predniSONE (DELTASONE) 20 MG tablet  Daily        12/16/21 2120    lidocaine (LIDODERM) 5 %  Every 24 hours        12/16/21 2120              Angelena Sand A, PA-C 12/17/21 2323    Luna Fuse, MD 12/29/21 2226

## 2021-12-16 NOTE — Discharge Instructions (Signed)
It was a pleasure taking care of you today!  ? ?You are prescribed Robaxin, lidoderm patch, and a prednisone taper.  Take medications. Do not operate any heavy machinery or drive while taking Robaxin.  You may apply ice or heat to the affected area for up to 15 minutes at a time.  Ensure to place a barrier between your skin and the ice or heat. Return to the Emergency Department if you are experiencing loss of bowel or bladder, increasing/worsening symptoms, fever, inability to walk. ?

## 2021-12-22 ENCOUNTER — Emergency Department (HOSPITAL_COMMUNITY): Payer: Medicare Other

## 2021-12-22 ENCOUNTER — Other Ambulatory Visit: Payer: Self-pay

## 2021-12-22 ENCOUNTER — Encounter (HOSPITAL_COMMUNITY): Payer: Self-pay

## 2021-12-22 ENCOUNTER — Inpatient Hospital Stay (HOSPITAL_COMMUNITY)
Admission: EM | Admit: 2021-12-22 | Discharge: 2021-12-28 | DRG: 380 | Discharge status: Against Medical Advice | Payer: Medicare Other | Attending: Family Medicine | Admitting: Family Medicine

## 2021-12-22 DIAGNOSIS — I10 Essential (primary) hypertension: Secondary | ICD-10-CM | POA: Diagnosis present

## 2021-12-22 DIAGNOSIS — R079 Chest pain, unspecified: Secondary | ICD-10-CM

## 2021-12-22 DIAGNOSIS — Z79899 Other long term (current) drug therapy: Secondary | ICD-10-CM

## 2021-12-22 DIAGNOSIS — Z885 Allergy status to narcotic agent status: Secondary | ICD-10-CM

## 2021-12-22 DIAGNOSIS — K251 Acute gastric ulcer with perforation: Secondary | ICD-10-CM | POA: Diagnosis not present

## 2021-12-22 DIAGNOSIS — Z791 Long term (current) use of non-steroidal anti-inflammatories (NSAID): Secondary | ICD-10-CM | POA: Diagnosis not present

## 2021-12-22 DIAGNOSIS — K862 Cyst of pancreas: Secondary | ICD-10-CM | POA: Diagnosis present

## 2021-12-22 DIAGNOSIS — E876 Hypokalemia: Secondary | ICD-10-CM | POA: Diagnosis present

## 2021-12-22 DIAGNOSIS — R911 Solitary pulmonary nodule: Secondary | ICD-10-CM | POA: Diagnosis present

## 2021-12-22 DIAGNOSIS — F3181 Bipolar II disorder: Secondary | ICD-10-CM | POA: Diagnosis present

## 2021-12-22 DIAGNOSIS — E890 Postprocedural hypothyroidism: Secondary | ICD-10-CM | POA: Diagnosis present

## 2021-12-22 DIAGNOSIS — K859 Acute pancreatitis without necrosis or infection, unspecified: Secondary | ICD-10-CM | POA: Diagnosis present

## 2021-12-22 DIAGNOSIS — H903 Sensorineural hearing loss, bilateral: Secondary | ICD-10-CM | POA: Diagnosis present

## 2021-12-22 DIAGNOSIS — Z881 Allergy status to other antibiotic agents status: Secondary | ICD-10-CM | POA: Diagnosis not present

## 2021-12-22 DIAGNOSIS — D72829 Elevated white blood cell count, unspecified: Secondary | ICD-10-CM | POA: Diagnosis not present

## 2021-12-22 DIAGNOSIS — E782 Mixed hyperlipidemia: Secondary | ICD-10-CM | POA: Diagnosis present

## 2021-12-22 DIAGNOSIS — Z79818 Long term (current) use of other agents affecting estrogen receptors and estrogen levels: Secondary | ICD-10-CM

## 2021-12-22 DIAGNOSIS — T39395A Adverse effect of other nonsteroidal anti-inflammatory drugs [NSAID], initial encounter: Secondary | ICD-10-CM | POA: Diagnosis present

## 2021-12-22 DIAGNOSIS — R1013 Epigastric pain: Secondary | ICD-10-CM | POA: Diagnosis present

## 2021-12-22 DIAGNOSIS — Z20822 Contact with and (suspected) exposure to covid-19: Secondary | ICD-10-CM | POA: Diagnosis present

## 2021-12-22 DIAGNOSIS — N83201 Unspecified ovarian cyst, right side: Secondary | ICD-10-CM | POA: Diagnosis present

## 2021-12-22 DIAGNOSIS — I82611 Acute embolism and thrombosis of superficial veins of right upper extremity: Secondary | ICD-10-CM | POA: Diagnosis present

## 2021-12-22 DIAGNOSIS — R509 Fever, unspecified: Secondary | ICD-10-CM | POA: Diagnosis present

## 2021-12-22 DIAGNOSIS — Z7982 Long term (current) use of aspirin: Secondary | ICD-10-CM

## 2021-12-22 DIAGNOSIS — R6510 Systemic inflammatory response syndrome (SIRS) of non-infectious origin without acute organ dysfunction: Secondary | ICD-10-CM | POA: Diagnosis present

## 2021-12-22 DIAGNOSIS — R519 Headache, unspecified: Secondary | ICD-10-CM | POA: Diagnosis not present

## 2021-12-22 DIAGNOSIS — D849 Immunodeficiency, unspecified: Secondary | ICD-10-CM | POA: Diagnosis present

## 2021-12-22 DIAGNOSIS — Z8711 Personal history of peptic ulcer disease: Secondary | ICD-10-CM

## 2021-12-22 DIAGNOSIS — M62830 Muscle spasm of back: Secondary | ICD-10-CM | POA: Diagnosis present

## 2021-12-22 DIAGNOSIS — E871 Hypo-osmolality and hyponatremia: Secondary | ICD-10-CM | POA: Diagnosis present

## 2021-12-22 DIAGNOSIS — G8929 Other chronic pain: Secondary | ICD-10-CM | POA: Diagnosis present

## 2021-12-22 DIAGNOSIS — R1030 Lower abdominal pain, unspecified: Secondary | ICD-10-CM | POA: Diagnosis not present

## 2021-12-22 DIAGNOSIS — K255 Chronic or unspecified gastric ulcer with perforation: Secondary | ICD-10-CM | POA: Diagnosis present

## 2021-12-22 DIAGNOSIS — F411 Generalized anxiety disorder: Secondary | ICD-10-CM | POA: Diagnosis present

## 2021-12-22 DIAGNOSIS — Z91013 Allergy to seafood: Secondary | ICD-10-CM | POA: Diagnosis not present

## 2021-12-22 DIAGNOSIS — E119 Type 2 diabetes mellitus without complications: Secondary | ICD-10-CM | POA: Diagnosis present

## 2021-12-22 DIAGNOSIS — K259 Gastric ulcer, unspecified as acute or chronic, without hemorrhage or perforation: Secondary | ICD-10-CM

## 2021-12-22 DIAGNOSIS — L732 Hidradenitis suppurativa: Secondary | ICD-10-CM | POA: Diagnosis present

## 2021-12-22 DIAGNOSIS — K219 Gastro-esophageal reflux disease without esophagitis: Secondary | ICD-10-CM | POA: Diagnosis present

## 2021-12-22 DIAGNOSIS — R7303 Prediabetes: Secondary | ICD-10-CM | POA: Diagnosis not present

## 2021-12-22 DIAGNOSIS — K3189 Other diseases of stomach and duodenum: Secondary | ICD-10-CM | POA: Diagnosis present

## 2021-12-22 DIAGNOSIS — Z818 Family history of other mental and behavioral disorders: Secondary | ICD-10-CM

## 2021-12-22 DIAGNOSIS — Z880 Allergy status to penicillin: Secondary | ICD-10-CM | POA: Diagnosis not present

## 2021-12-22 DIAGNOSIS — Z833 Family history of diabetes mellitus: Secondary | ICD-10-CM

## 2021-12-22 DIAGNOSIS — F17290 Nicotine dependence, other tobacco product, uncomplicated: Secondary | ICD-10-CM | POA: Diagnosis present

## 2021-12-22 DIAGNOSIS — E785 Hyperlipidemia, unspecified: Secondary | ICD-10-CM | POA: Diagnosis present

## 2021-12-22 DIAGNOSIS — M549 Dorsalgia, unspecified: Secondary | ICD-10-CM | POA: Diagnosis present

## 2021-12-22 DIAGNOSIS — G4700 Insomnia, unspecified: Secondary | ICD-10-CM | POA: Diagnosis present

## 2021-12-22 DIAGNOSIS — R011 Cardiac murmur, unspecified: Secondary | ICD-10-CM | POA: Diagnosis not present

## 2021-12-22 DIAGNOSIS — Z888 Allergy status to other drugs, medicaments and biological substances status: Secondary | ICD-10-CM

## 2021-12-22 DIAGNOSIS — Z7984 Long term (current) use of oral hypoglycemic drugs: Secondary | ICD-10-CM

## 2021-12-22 DIAGNOSIS — Z7952 Long term (current) use of systemic steroids: Secondary | ICD-10-CM

## 2021-12-22 HISTORY — DX: Type 2 diabetes mellitus without complications: E11.9

## 2021-12-22 LAB — I-STAT BETA HCG BLOOD, ED (MC, WL, AP ONLY): I-stat hCG, quantitative: <5 m[IU]/mL (ref <5)

## 2021-12-22 LAB — CBC
HCT: 46.1 % — ABNORMAL HIGH (ref 36.0–46.0)
Hemoglobin: 14.7 g/dL (ref 12.0–15.0)
MCH: 28.7 pg (ref 26.0–34.0)
MCHC: 31.9 g/dL (ref 30.0–36.0)
MCV: 89.9 fL (ref 80.0–100.0)
Platelets: 254 10*3/uL (ref 150–400)
RBC: 5.13 MIL/uL — ABNORMAL HIGH (ref 3.87–5.11)
RDW: 14.6 % (ref 11.5–15.5)
WBC: 16.4 10*3/uL — ABNORMAL HIGH (ref 4.0–10.5)
nRBC: 0 % (ref 0.0–0.2)

## 2021-12-22 LAB — HEPATIC FUNCTION PANEL
ALT: 13 U/L (ref 0–44)
AST: 12 U/L — ABNORMAL LOW (ref 15–41)
Albumin: 3.1 g/dL — ABNORMAL LOW (ref 3.5–5.0)
Alkaline Phosphatase: 68 U/L (ref 38–126)
Bilirubin, Direct: <0.1 mg/dL (ref 0.0–0.2)
Total Bilirubin: 0.6 mg/dL (ref 0.3–1.2)
Total Protein: 6.8 g/dL (ref 6.5–8.1)

## 2021-12-22 LAB — URINALYSIS, ROUTINE W REFLEX MICROSCOPIC
Bilirubin Urine: NEGATIVE
Glucose, UA: NEGATIVE mg/dL
Ketones, ur: NEGATIVE mg/dL
Leukocytes,Ua: NEGATIVE
Nitrite: NEGATIVE
Protein, ur: NEGATIVE mg/dL
Specific Gravity, Urine: 1.044 — ABNORMAL HIGH (ref 1.005–1.030)
pH: 8 (ref 5.0–8.0)

## 2021-12-22 LAB — RESP PANEL BY RT-PCR (FLU A&B, COVID) ARPGX2
Influenza A by PCR: NEGATIVE
Influenza B by PCR: NEGATIVE
SARS Coronavirus 2 by RT PCR: NEGATIVE

## 2021-12-22 LAB — LIPASE, BLOOD: Lipase: 26 U/L (ref 11–51)

## 2021-12-22 LAB — BASIC METABOLIC PANEL
Anion gap: 9 (ref 5–15)
BUN: 9 mg/dL (ref 6–20)
CO2: 26 mmol/L (ref 22–32)
Calcium: 8.5 mg/dL — ABNORMAL LOW (ref 8.9–10.3)
Chloride: 104 mmol/L (ref 98–111)
Creatinine, Ser: 0.84 mg/dL (ref 0.44–1.00)
GFR, Estimated: >60 mL/min (ref >60)
Glucose, Bld: 119 mg/dL — ABNORMAL HIGH (ref 70–99)
Potassium: 3.5 mmol/L (ref 3.5–5.1)
Sodium: 139 mmol/L (ref 135–145)

## 2021-12-22 LAB — LACTIC ACID, PLASMA
Lactic Acid, Venous: 0.7 mmol/L (ref 0.5–1.9)
Lactic Acid, Venous: 0.5 mmol/L (ref 0.5–1.9)

## 2021-12-22 LAB — TROPONIN I (HIGH SENSITIVITY)
Troponin I (High Sensitivity): 11 ng/L (ref <18)
Troponin I (High Sensitivity): 12 ng/L (ref <18)

## 2021-12-22 MED ORDER — DIPHENHYDRAMINE HCL 25 MG PO CAPS
25.0000 mg | ORAL_CAPSULE | ORAL | Status: DC | PRN
Start: 2021-12-22 — End: 2021-12-23
  Administered 2021-12-22: 25 mg via ORAL
  Filled 2021-12-22: qty 1

## 2021-12-22 MED ORDER — METRONIDAZOLE 500 MG/100ML IV SOLN
500.0000 mg | Freq: Two times a day (BID) | INTRAVENOUS | Status: DC
  Administered 2021-12-22 – 2021-12-25 (×6): 500 mg via INTRAVENOUS
  Filled 2021-12-22 (×6): qty 100

## 2021-12-22 MED ORDER — DIAZEPAM 5 MG/ML IJ SOLN
2.5000 mg | Freq: Three times a day (TID) | INTRAMUSCULAR | Status: DC
  Administered 2021-12-22 – 2021-12-23 (×2): 2.5 mg via INTRAVENOUS
  Filled 2021-12-22 (×2): qty 2

## 2021-12-22 MED ORDER — GABAPENTIN 300 MG PO CAPS: 300.0000 mg | ORAL_CAPSULE | Freq: Every day | ORAL | Status: DC | PRN

## 2021-12-22 MED ORDER — SODIUM CHLORIDE 0.9 % IV BOLUS
1000.0000 mL | Freq: Once | INTRAVENOUS | Status: AC
  Administered 2021-12-22: 1000 mL via INTRAVENOUS

## 2021-12-22 MED ORDER — FAMOTIDINE 20 MG PO TABS: 20.0000 mg | ORAL_TABLET | Freq: Two times a day (BID) | ORAL | Status: DC

## 2021-12-22 MED ORDER — HEPARIN SODIUM (PORCINE) 5000 UNIT/ML IJ SOLN
5000.0000 [IU] | Freq: Three times a day (TID) | INTRAMUSCULAR | Status: DC
  Administered 2021-12-22 – 2021-12-25 (×8): 5000 [IU] via SUBCUTANEOUS
  Filled 2021-12-22 (×7): qty 1

## 2021-12-22 MED ORDER — PANTOPRAZOLE SODIUM 20 MG PO TBEC: 20.0000 mg | DELAYED_RELEASE_TABLET | Freq: Every day | ORAL | Status: DC

## 2021-12-22 MED ORDER — SODIUM CHLORIDE 0.9 % IV SOLN
2.0000 g | Freq: Once | INTRAVENOUS | Status: AC
  Administered 2021-12-22: 2 g via INTRAVENOUS
  Filled 2021-12-22: qty 12.5

## 2021-12-22 MED ORDER — ROSUVASTATIN CALCIUM 5 MG PO TABS
5.0000 mg | ORAL_TABLET | Freq: Every day | ORAL | Status: DC
  Administered 2021-12-22 – 2021-12-28 (×6): 5 mg via ORAL
  Filled 2021-12-22 (×7): qty 1

## 2021-12-22 MED ORDER — MIRTAZAPINE 15 MG PO TABS: 15.0000 mg | ORAL_TABLET | Freq: Every day | ORAL | Status: DC

## 2021-12-22 MED ORDER — SODIUM CHLORIDE 0.9 % IV SOLN
2.0000 g | Freq: Three times a day (TID) | INTRAVENOUS | Status: DC
  Administered 2021-12-22 – 2021-12-25 (×8): 2 g via INTRAVENOUS
  Filled 2021-12-22 (×8): qty 12.5

## 2021-12-22 MED ORDER — CYCLOBENZAPRINE HCL 10 MG PO TABS: 10.0000 mg | ORAL_TABLET | Freq: Three times a day (TID) | ORAL | Status: DC | PRN

## 2021-12-22 MED ORDER — DIPHENHYDRAMINE HCL 50 MG/ML IJ SOLN: 12.5000 mg | Freq: Four times a day (QID) | INTRAMUSCULAR | Status: DC | PRN

## 2021-12-22 MED ORDER — LACTATED RINGERS IV SOLN: INTRAVENOUS | Status: DC

## 2021-12-22 MED ORDER — PANTOPRAZOLE SODIUM 40 MG IV SOLR
40.0000 mg | Freq: Two times a day (BID) | INTRAVENOUS | Status: DC
Start: 2021-12-22 — End: 2021-12-23
  Administered 2021-12-22 – 2021-12-23 (×2): 40 mg via INTRAVENOUS
  Filled 2021-12-22 (×2): qty 10

## 2021-12-22 MED ORDER — MORPHINE SULFATE (PF) 2 MG/ML IV SOLN
1.0000 mg | INTRAVENOUS | Status: DC | PRN
  Administered 2021-12-22 – 2021-12-23 (×2): 1 mg via INTRAVENOUS
  Filled 2021-12-22 (×2): qty 1

## 2021-12-22 MED ORDER — DIAZEPAM 5 MG PO TABS: 5.0000 mg | ORAL_TABLET | Freq: Three times a day (TID) | ORAL | Status: DC

## 2021-12-22 MED ORDER — IOHEXOL 350 MG/ML SOLN
100.0000 mL | Freq: Once | INTRAVENOUS | Status: AC | PRN
  Administered 2021-12-22: 100 mL via INTRAVENOUS

## 2021-12-22 NOTE — H&P (Addendum)
Family Medicine Teaching Service ?Hospital Admission History and Physical ?Service Pager: [phone] ? ?Patient name: Crystal Mora Medical record number: [id number] ?Date of birth: 1975-08-18 Age: 46 y.o. Gender: female ? ?Primary Care Provider: [facility] ?Consultants: GI, Surgery  ?Code Status: Full Code ? ?   ?Preferred Emergency Contact: Iona Coach [phone] ? ?Chief Complaint: Chest pain, abdominal pain ? ?Assessment and Plan: ? ?Posterior perforated gastric ulcer 2/2 chronic NSAID use ?Crystal Mora is a 45 y.o. female presenting with chest pain and abdominal pain. PMH is significant for Bipolar depression, HTN, HLD.  Patient presented to ED with pulse 116, RR 16, BP 113/77, satting 99% on room air, afebrile.  Labs are significant for negative troponins, WBC 16.4, AST 12. Trop 11>12. CXR showed subtle prominence of perihilar markings thought to be minimal vascular congestion or viral bronchitic process.  CT angio chest showed 12 mm cavitary nodule in right lower lobe, 3.3 x 2.7 x 2.6 complex multicystic lesion along the cranial margin of the pancreatic body extending up adjacent to the gastric fundus inferior to esophagogastric junction, associated with cluster of mildly enlarged lymph nodes, and 4.2 cm benign-appearing right adnexal cyst.  GI consulted for imaging findings and had concern for perforated ulcer given history of excess NSAID use, prolonged steroid use, symptoms, and imaging.  Surgery consult and felt no need for emergent surgery, treat with bowel rest, PPI, and IV antibiotics. Chest pain likely related to perforated peptic ulcer and musculoskeletal etiology given TTP on anterior chest well, trops flat and EKG wnl. ?-Admit to FPTS, medical telemetry, attending Dr. Talbert Cage ?-GI following appreciate recs ?- Surgery following appreciate recs ?- Continue cefepime 2 g every 8 hours ?- Continue metronidazole 500 mg every 12 hours ?- Bowel rest ?- Protonix  40 mg every 12 hours ?- PT/OT ?- N.p.o. ?- Heparin 5,000 units every 8 hours for DVT prophylaxis ?-CBC, BMP am ? ?Cavitary lung lesion ?12 mm cavitary nodule RLL found on CTA. Reported to be infectious (concern for septic embolism) Vs inflammatory. Spoke with Dr Valere Dross call radiologist regarding pulmonary nodule found on CTA. He advised that given that the nodule is small, non specific and ill defined we can follow up with CT chest in 3 months as an outpatient. No further inpatient investigations necessary for this. No recent tb exposure. ?-F/u quant gold  ?-Follow up as outpatient ? ?Prediabetes  DM ?Patient reports last hemoglobin A1c 6, was prescribed metformin but does not take it. ?-Consider HgbA1c ? ?Insomnia ?Patient taking Valium 5 mg 3 times daily, and mirtazapine 15 mg nightly ?- Continue Valium ? ?HTN ?BP 113/77 on admission ?Patient taking losartan 100 mg daily, amlodipine 2.5 mg daily ?-hold meds in setting of soft BPs ? ?HLD ?-Crestor 5 mg daily ? ?Headache ?Patient with history of chronic headaches taking a 50 pack of BC headache powder every week. ?-Discourage BC powder use ? ?Hidraenitus Suppurative  ?Patient reports history of hidradenitis suppurative and takes and Humira weekly. ?-Hold humira ? ?Chronic Back Pain ?Patient with history of chronic back pain follows with pain management.  Patient reports taking prednisone taper for her back pain for the last month.  Also takes antispasmodics for back spasms, patient taking Flexeril 10 mg, Robaxin 500 mg twice daily ?-Hold home meds ?-Morphine 1 mg q4h prn, administer with benadryl, for back pain ? ?Hx of Partial Thyroidectomy ?Parital thyroid removal for fluid pouch found between spine and brain. Was emergency surgery with partial thyroid removal, 8  years ago. ? ?FEN/GI: NPO, LR ? ?Prophylaxis: 5,000 units heparin every 8 hours ? ?Disposition: Med telemetry ? ?History of Present Illness:  Crystal Mora is a 46 y.o. female presenting with chest  pain and abdominal pain. ? ?Patient reports she was in her normal state of health previously, and began having chest pain and abdominal pain.  Chest pain described as sharp and stabbing sensation starting in chest and going through back. Abdominal pain described as squeezing type pressure in lower abdomen.  Patient describes pain as lasting a few seconds at a time, happening numerous times an hour. Patient reports she has been in bed since symptoms started on Tuesday, thinking she could sleep off the pain.  The next day while walking around the house her husband noted she seems short of breath, patient decided she would seek medical care if symptoms continued the next day.  Patient went to ED the following morning. Patient denies any nausea, vomiting, diarrhea, fever, constipation or dark stools.  ? ?Patient also reports that she has lost 20 pounds over the last 6 months. She also reports that she's been on prednisone for last month, and takes 10 bc powder packs a day. She reports using a 50 pack in a week, every week, for last 20 years.  ? ?Review Of Systems: Per HPI with the following additions:  ? ?Review of Systems  ?Cardiovascular:  Positive for chest pain.  ?Gastrointestinal:  Positive for abdominal pain. Negative for blood in stool, constipation, diarrhea, nausea and vomiting.   ? ?Patient Active Problem List  ? Diagnosis Date Noted  ? Epigastric pain 12/22/2021  ? Peroneal tendinitis 12/06/2021  ? Chronic buttock pain 07/26/2021  ? Arthropathy of lumbar facet joint 10/10/2020  ? Bipolar 2 disorder, major depressive episode ([place]) 03/30/2020  ? Other bipolar disorder ([place]) 03/30/2020  ? GAD (generalized anxiety disorder) 03/30/2020  ? Hearing loss 04/30/2019  ? IFG (impaired fasting glucose) 04/30/2019  ? Overweight with body mass index (BMI) of 28 to 28.9 in adult 01/28/2019  ? Cervical radiculopathy 01/06/2019  ? Depression 10/30/2018  ? Hypertension 10/30/2018  ? Mixed hyperlipidemia 10/30/2018  ? History  of thyroidectomy, subtotal 09/10/2018  ? Lumbar degenerative disc disease 09/05/2018  ? Chronic migraine w/o aura w/o status migrainosus, not intractable 06/19/2018  ? Chronic back pain 05/30/2018  ? Menorrhagia 11/25/2017  ? Iron deficiency 10/03/2017  ? Pulsatile tinnitus of left ear 07/18/2017  ? Sensorineural hearing loss (SNHL), bilateral 07/18/2017  ? Cesarean deliv due to previous difficult deliv, deliv, curr hospitaliz 01/02/2017  ? Chronic narcotic use 06/29/2016  ? History of eclampsia 06/29/2016  ? History of reversal of tubal ligation 03/28/2016  ? Anemia 01/12/2016  ? GERD (gastroesophageal reflux disease) 11/10/2015  ? Analgesic rebound headache 07/25/2015  ? Chronic idiopathic urticaria 07/25/2015  ? Hidradenitis suppurativa 07/25/2015  ? Hyperopia of both eyes with astigmatism and presbyopia 07/25/2015  ? Meibomian gland dysfunction (MGD) of upper and lower lids of both eyes 07/25/2015  ? Psychiatric disorder 07/25/2015  ? ? ?Past Medical History: ?Past Medical History:  ?Diagnosis Date  ? Anxiety   ? Bipolar 1 disorder ([place])   ? Chronic urticaria   ? Depression   ? Diabetes mellitus without complication ([place])   ? Hypertension   ? states she has not took BP medication for 2 years  ? Insomnia   ? Neuromuscular disorder ([place])   ? back  ? Thyroid disease   ? ? ?Past Surgical History: ?Past Surgical History:  ?  Procedure Laterality Date  ? CESAREAN SECTION    ? THYROID SURGERY    ? TUBAL LIGATION    ? tubal reversal Bilateral   ? ? ?Social History: ?Social History  ? ?Tobacco Use  ? Smoking status: Former  ?  Packs/day: 0.50  ?  Types: Cigars, Cigarettes  ? Smokeless tobacco: Never  ? Tobacco comments:  ?  1 cigar daily  ?Vaping Use  ? Vaping Use: Every day  ? Substances: Flavoring  ?Substance Use Topics  ? Alcohol use: Yes  ?  Comment: Rare  ? Drug use: No  ? ? ? ?Family History: ? ?Fam Hx: Gastroparesis and lung cancer ?Family History  ?Problem Relation Age of Onset  ? Depression Mother   ? Mental  illness Mother   ? Asthma Mother   ? COPD Mother   ? Diabetes Mother   ? Mental illness Brother   ? Mental illness Daughter   ? Diabetes Maternal Aunt   ? Mental illness Maternal Aunt   ? Depression Materna

## 2021-12-22 NOTE — ED Provider Notes (Signed)
?[facility] ?Provider Note ? ? ?CSN: [id number] ?Arrival date & time: 12/22/21  1003 ? ?  ? ?History ? ?Chief Complaint  ?Patient presents with  ? Chest Pain  ? ? ?Crystal Mora is a 46 y.o. female With a past medical history of hypertension, iron deficiency anemia, bipolar 1 disorder, diabetes mellitus, lumbosacral radiculopathy, anxiety, depression.  Presents to the emergency department with a chief complaint of chest pain and epigastric abdominal pain. ? ?Patient reports that pain started after waking on Wednesday morning.  Pain is located to the inferior aspect of her sternum.  Pain has been constant since starting.  Patient rates pain 10/10 on the pain scale.  Patient describes pain as a stabbing.  Pain radiates to her back.  Patient reports that pain is worse with exertion and touch.  Patient endorses that she has had shortness of breath and lightheadedness with exertion over the last 3 days.  Additionally patient reports that she woke with a fever on Wednesday morning.  Patient has had chills intermittently since then. ? ?Patient is on progesterone pill and estrogen patch for menopause.  Patient endorses smoking cigars within the last 90 days.  Patient does endorse frequent NSAID use.Patient denies any history of DVT/PE, surgery last 4 weeks, history of malignancy, illicit drug use, alcohol use, history of IV drug use, history of TB or TB exposure, [place]. ? ?Patient denies any palpitations, leg swelling or tenderness, hemoptysis, abdominal pain, nausea, vomiting, diarrhea.   ? ? ?Chest Pain ?Associated symptoms: fever and shortness of breath   ?Associated symptoms: no abdominal pain, no back pain, no dizziness, no headache, no nausea, no numbness, no vomiting and no weakness   ? ?  ? ?Home Medications ?Prior to Admission medications   ?Medication Sig Start Date End Date Taking? Authorizing Provider  ?aspirin EC 81 MG tablet Take 81 mg by mouth  daily.    [provider]  ?Aspirin-Salicylamide-Caffeine (BC HEADACHE POWDER PO) Take 1 Package by mouth daily as needed (headache).    [provider]  ?atorvastatin (LIPITOR) 20 MG tablet Take 20 mg by mouth daily.    [provider]  ?celecoxib (CELEBREX) 100 MG capsule celecoxib 100 mg capsule 12/04/21   [provider]  ?cetirizine (ZYRTEC) 10 MG tablet Take 10 mg by mouth daily.    [provider]  ?cyclobenzaprine (FLEXERIL) 10 MG tablet Take 1 tablet (10 mg total) by mouth every 8 (eight) hours as needed for muscle spasms. 10/19/16   Starr Lake, CNM  ?diazepam (VALIUM) 5 MG tablet diazepam 5 mg tablet    [provider]  ?diclofenac (VOLTAREN) 75 MG EC tablet Take 75 mg by mouth 2 (two) times daily.    [provider]  ?diphenhydrAMINE (BENADRYL) 50 MG capsule Take 50 mg by mouth every 6 (six) hours as needed.    [provider]  ?doxycycline (VIBRA-TABS) 100 MG tablet doxycycline hyclate 100 mg tablet ? TAKE 1 TAB TWICE DAILY WITH FULL MEAL.    [provider]  ?DULoxetine (CYMBALTA) 60 MG capsule Take 60 mg by mouth daily.    [provider]  ?EPINEPHrine (EPIPEN 2-PAK) 0.3 mg/0.3 mL IJ SOAJ injection Inject 0.3 mLs (0.3 mg total) into the muscle once as needed for up to 1 dose (for severe allergic reaction). CAll 911 immediately if you have to use this medicine 09/24/18   Larene Pickett, PA-C  ?estradiol (CLIMARA - DOSED IN MG/24  HR) 0.05 mg/24hr patch APPLY 1 PATCH ONCE A WEEK 11/14/21   [provider]  ?famotidine (PEPCID) 20 MG tablet Take 1 tablet (20 mg total) by mouth 2 (two) times daily. 02/27/20   Jacqulyn Cane, MD  ?fluconazole (DIFLUCAN) 200 MG tablet fluconazole 200 mg tablet ? TAKE A TABLET BY MOUTH ONCE WEEKLY FOR 3 WEEKS. 06/09/21   [provider]  ?gabapentin (NEURONTIN) 300 MG capsule gabapentin 300 mg capsule 01/16/21   [provider]  ?HUMIRA PEN 80 MG/0.8ML  PNKT Inject into the skin. 12/04/21   [provider]  ?HYDROcodone-acetaminophen (NORCO/VICODIN) 5-325 MG tablet Take 0.5-1 tablets by mouth 2 (two) times daily. 12/02/21   [provider]  ?hydroxychloroquine (PLAQUENIL) 200 MG tablet hydroxychloroquine 200 mg tablet 12/05/21   [provider]  ?hydrOXYzine (ATARAX/VISTARIL) 25 MG tablet Take 1 every 4-6 hours as needed for itch or hives. (Caution: May cause drowsiness) 02/27/20   Jacqulyn Cane, MD  ?lidocaine (LIDODERM) 5 % Place 1 patch onto the skin daily. Remove & Discard patch within 12 hours or as directed by MD 12/16/21   Blue, Soijett A, PA-C  ?losartan (COZAAR) 100 MG tablet Take 1 tablet by mouth daily. 11/07/21   [provider]  ?metFORMIN (GLUCOPHAGE) 500 MG tablet metformin 500 mg tablet ? TAKE 1 TABLET BY MOUTH TWICE DAILY    [provider]  ?methocarbamol (ROBAXIN) 500 MG tablet Take 1 tablet (500 mg total) by mouth 2 (two) times daily. 12/16/21   Blue, Soijett A, PA-C  ?montelukast (SINGULAIR) 10 MG tablet Take by mouth. 12/17/16   [provider]  ?omalizumab Arvid Right) 150 MG/ML prefilled syringe Inject into the skin every 14 (fourteen) days.    [provider]  ?progesterone (PROMETRIUM) 100 MG capsule progesterone micronized 100 mg capsule ? TAKE 1 CAPSULE BY MOUTH EVERY DAY NIGHTLY 02/09/21   [provider]  ?ranitidine (ZANTAC) 150 MG capsule Take 150 mg by mouth 2 (two) times daily.    [provider]  ?rizatriptan (MAXALT) 10 MG tablet Take 10 mg by mouth as needed for migraine. May repeat in 2 hours if needed    [provider]  ?rosuvastatin (CRESTOR) 5 MG tablet Take 1 tablet by mouth daily. 10/13/21   [provider]  ?sertraline (ZOLOFT) 25 MG tablet Take 25 mg by mouth at bedtime. 09/16/16   [provider]  ?sertraline (ZOLOFT) 50 MG tablet Take by mouth. 01/06/17   [provider]  ?spironolactone (ALDACTONE) 50 MG tablet  spironolactone 50 mg tablet ? TAKE 1 TABLET DAILY FOR 2 WEEKS, IF TOLERATED INCREASE TO 2 TABS DAILY 06/09/21   [provider]  ?tiZANidine (ZANAFLEX) 4 MG capsule Take 4 mg by mouth 3 (three) times daily.    [provider]  ?traMADol (ULTRAM) 50 MG tablet tramadol 50 mg tablet ? TAKE 1 TABLET (50 MG TOTAL) BY MOUTH EVERY 8 HOURS AS NEEDED FOR UP TO 5 DAYS FOR MODERATE PAIN(4-6)    [provider]  ?   ? ?Allergies    ?Biaxin [clarithromycin], Morphine and related, Penicillins, Percocet [oxycodone-acetaminophen], Shrimp [shellfish allergy], Citalopram, and Topiramate   ? ?Review of Systems   ?Review of Systems  ?Constitutional:  Positive for chills and fever.  ?Eyes:  Negative for visual disturbance.  ?Respiratory:  Positive for shortness of breath.   ?Cardiovascular:  Positive for chest pain.  ?Gastrointestinal:  Negative for abdominal pain, diarrhea, nausea and vomiting.  ?Genitourinary:  Negative for difficulty urinating, dysuria,  frequency, hematuria, urgency, vaginal bleeding, vaginal discharge and vaginal pain.  ?Musculoskeletal:  Negative for back pain and neck pain.  ?Skin:  Negative for color change and rash.  ?Neurological:  Positive for light-headedness. Negative for dizziness, syncope, weakness, numbness and headaches.  ?Psychiatric/Behavioral:  Negative for confusion.   ? ?Physical Exam ?Updated Vital Signs ?BP 113/77 (BP Location: Right Arm)   Pulse (!) 116   Temp 99.9 ?F (37.7 ?C) (Oral)   Resp 16   Ht 5\' 6"  (1.676 m)   Wt 74.8 kg   SpO2 99%   BMI 26.62 kg/m?  ?Physical Exam ?Vitals and nursing note reviewed.  ?Constitutional:   ?   General: She is not in acute distress. ?   Appearance: She is not ill-appearing, toxic-appearing or diaphoretic.  ?HENT:  ?   Head: Normocephalic.  ?Eyes:  ?   General: No scleral icterus.    ?   Right eye: No discharge.     ?   Left eye: No discharge.  ?Cardiovascular:  ?   Rate and Rhythm: Normal rate.  ?Pulmonary:  ?   Effort:  Pulmonary effort is normal. No tachypnea, bradypnea or respiratory distress.  ?   Breath sounds: Normal breath sounds. No stridor.  ?   Comments: Speaks in full complete sentences without difficulty ?Abdominal:  ?   General: Abdomen

## 2021-12-22 NOTE — Progress Notes (Signed)
Pharmacy Antibiotic Note ? ?Crystal Mora is a 46 y.o. female admitted on 12/22/2021 with  intra-abdominal infection and lung lesion .  Pharmacy has been consulted for cefepime dosing. ? ?WBC 16.4, Tmax 99.9, HR 101 ?CT Angio Chest PE - 69mm cavitary RLL nodule, 3.3x2,7x2,6 cm complex multicystic lesion along the cranial margin of the pancreatic body, 4.2 cm benign appearing R adnexal cyst ? ?Plan: ?Cefepime 2g IV q8h ?Continue to monitor SCr, WBC, temp, and clinical s/sx of infection.  ?F/u cultures and de-escalate as appropriate ? ?Height: 5\' 6"  (167.6 cm) ?Weight: 74.8 kg (164 lb 14.5 oz) ?IBW/kg (Calculated) : 59.3 ? ?Temp (24hrs), Avg:99.9 ?F (37.7 ?C), Min:99.9 ?F (37.7 ?C), Max:99.9 ?F (37.7 ?C) ? ?Recent Labs  ?Lab 12/22/21 ?1020  ?WBC 16.4*  ?CREATININE 0.84  ?  ?Estimated Creatinine Clearance: 88.4 mL/min (by C-G formula based on SCr of 0.84 mg/dL).   ? ?Allergies  ?Allergen Reactions  ? Biaxin [Clarithromycin]   ? Morphine And Related   ? Penicillins Hives, Itching and Swelling  ?  Has patient had a PCN reaction causing immediate rash, facial/tongue/throat swelling, SOB or lightheadedness with hypotension: No ?Has patient had a PCN reaction causing severe rash involving mucus membranes or skin necrosis: No ?Has patient had a PCN reaction that required hospitalization No ?Has patient had a PCN reaction occurring within the last 10 years: Yes ?If all of the above answers are "NO", then may proceed with Cephalosporin use. ?  ? Percocet [Oxycodone-Acetaminophen] Hives and Itching  ? Shrimp [Shellfish Allergy]   ? Citalopram Anxiety  ?  aggression  ? Topiramate Rash  ? ? ?Antimicrobials this admission: ?Cefepime 5/12 >>  ?Metronidazole 5/12 >>  ? ?Dose adjustments this admission: ?N/A ? ?Microbiology results: ?None collected at this time ? ?Thank you for allowing pharmacy to be a part of this patient?s care. ? ?Kaleen Mask ?12/22/2021 2:41 PM ? ?

## 2021-12-22 NOTE — Progress Notes (Signed)
Spoke with Dr Valere Dross call radiologist regarding pulmonary nodule found on CTA. He advised that given that the nodule is small, non specific and ill defined we can follow up with CT chest in 3 months as an outpatient. No further inpatient investigations necessary for this.  ? ?Lattie Haw MD ?PGY-3, [facility]  ?

## 2021-12-22 NOTE — ED Notes (Signed)
Transport notified to take patient upstairs. ?

## 2021-12-22 NOTE — Consult Note (Addendum)
[facility] Referring Provider: ER MD Primary Care Physician:  [facility] Primary Gastroenterologist:  Dr. Sandria Manly in [facility] [place], [facility]  Reason for Consultation: Abd pain, abnormal CT   HPI:  Crystal Mora is a 46 y.o. female who was in her usual state of health until about 3 days ago when she started having significant epigastric and lower chest pains.  Mild nausea but no vomiting.  She calls it a constant stabbing pain.  Her daughter took her temperature and it was 100.7 at home.  The pain became significant above that she eventually presented to the emergency room.    She takes 50 BC powders per week and has been on prednisone for the past 6 to 8 weeks.  She has been on these [facility] powders for years, taking them for a variety of back pains and headache pains.  She has intentionally lost about 20 pounds in the past 6 or 8 months to help control her diabetes.  She has had no overt GI bleeding.  Specifically no hematemesis and no melena.  She has really had no change in her bowels.   Past Medical History:  Diagnosis Date   Anxiety    Bipolar 1 disorder (HCC)    Chronic urticaria    Depression    Diabetes mellitus without complication (HCC)    Hypertension    states she has not took BP medication for 2 years   Insomnia    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   Thyroid disease     Past Surgical History:  Procedure Laterality Date   CESAREAN SECTION     THYROID SURGERY     TUBAL LIGATION     tubal reversal Bilateral     Prior to Admission medications   Medication Sig Start Date End Date Taking? Authorizing Provider  amLODipine (NORVASC) 2.5 MG tablet Take 2.5 mg by mouth daily. 12/11/21  Yes [provider]  aspirin EC 81 MG tablet Take 81 mg by mouth daily.   Yes [provider]  Aspirin-Salicylamide-Caffeine (BC HEADACHE POWDER PO) Take 1 Package by mouth daily as needed (headache).   Yes [provider]   celecoxib (CELEBREX) 100 MG capsule Take 100 mg by mouth daily. 12/04/21  Yes [provider]  cetirizine (ZYRTEC) 10 MG tablet Take 10 mg by mouth daily.   Yes [provider]  cyclobenzaprine (FLEXERIL) 10 MG tablet Take 1 tablet (10 mg total) by mouth every 8 (eight) hours as needed for muscle spasms. 10/19/16  Yes Marylene Land, CNM  diazepam (VALIUM) 5 MG tablet Take 5 mg by mouth in the morning, at noon, and at bedtime.   Yes [provider]  EPINEPHrine (EPIPEN 2-PAK) 0.3 mg/0.3 mL IJ SOAJ injection Inject 0.3 mLs (0.3 mg total) into the muscle once as needed for up to 1 dose (for severe allergic reaction). CAll 911 immediately if you have to use this medicine 09/24/18  Yes Allyne Gee, Rosezella [place], PA-C  estradiol (CLIMARA - DOSED IN MG/24 HR) 0.05 mg/24hr patch Place 0.05 mg onto the skin once a week. 11/14/21  Yes [provider]  famotidine (PEPCID) 20 MG tablet Take 1 tablet (20 mg total) by mouth 2 (two) times daily. Patient taking differently: Take 20 mg by mouth daily as needed for heartburn. 02/27/20  Yes Lajean Manes, MD  gabapentin (NEURONTIN) 300 MG capsule Take 300 mg by mouth daily as needed (For feet pain). 01/16/21  Yes [provider]  HUMIRA PEN 80 MG/0.8ML PNKT Inject 80 mg into the skin every 14 (fourteen) days. 12/04/21  Yes [provider]  hydroxychloroquine (PLAQUENIL) 200 MG tablet Take 200 mg by mouth daily. 12/05/21  Yes [provider]  hydrOXYzine (ATARAX/VISTARIL) 25 MG tablet Take 1 every 4-6 hours as needed for itch or hives. (Caution: May cause drowsiness) Patient taking differently: Take 25 mg by mouth every 6 (six) hours as needed for itching. 02/27/20  Yes Lajean Manes, MD  losartan (COZAAR) 100 MG tablet Take 1 tablet by mouth daily. 11/07/21  Yes [provider]  metFORMIN (GLUCOPHAGE) 500 MG tablet Take 500 mg by mouth 2 (two) times daily with a meal.   Yes [provider]   methocarbamol (ROBAXIN) 500 MG tablet Take 1 tablet (500 mg total) by mouth 2 (two) times daily. Patient taking differently: Take 500 mg by mouth every 6 (six) hours as needed for muscle spasms. 12/16/21  Yes Blue, Soijett A, PA-C  mirtazapine (REMERON) 15 MG tablet Take 15 mg by mouth at bedtime. 12/18/21  Yes [provider]  omalizumab Geoffry Paradise) 150 MG/ML prefilled syringe Inject 150 mg into the skin every 14 (fourteen) days.   Yes [provider]  pantoprazole (PROTONIX) 20 MG tablet Take 20 mg by mouth daily. 12/11/21  Yes [provider]  progesterone (PROMETRIUM) 100 MG capsule Take 100 mg by mouth daily. 02/09/21  Yes [provider]  RESTASIS 0.05 % ophthalmic emulsion Place 1 drop into both eyes daily as needed for dry eyes. 12/20/21  Yes [provider]  rosuvastatin (CRESTOR) 5 MG tablet Take 1 tablet by mouth daily. 10/13/21  Yes [provider]  fluconazole (DIFLUCAN) 200 MG tablet fluconazole 200 mg tablet  TAKE A TABLET BY MOUTH ONCE WEEKLY FOR 3 WEEKS. Patient not taking: Reported on 12/22/2021 06/09/21   [provider]  lidocaine (LIDODERM) 5 % Place 1 patch onto the skin daily. Remove & Discard patch within 12 hours or as directed by MD Patient not taking: Reported on 12/22/2021 12/16/21   Blue, Soijett A, PA-C  spironolactone (ALDACTONE) 50 MG tablet Take 50 mg by mouth daily. Patient not taking: Reported on 12/22/2021 06/09/21   [provider]    Current Facility-Administered Medications  Medication Dose Route Frequency Provider Last Rate Last Admin   ceFEPIme (MAXIPIME) 2 g in sodium chloride 0.9 % 100 mL IVPB  2 g Intravenous Q8H Badalamente, Peter R, PA-C       metroNIDAZOLE (FLAGYL) IVPB 500 mg  500 mg Intravenous Q12H Haskel Schroeder, PA-C 100 mL/hr at 12/22/21 1540 500 mg at 12/22/21 1540   Current Outpatient Medications  Medication Sig Dispense Refill   amLODipine (NORVASC) 2.5 MG tablet Take 2.5 mg  by mouth daily.     aspirin EC 81 MG tablet Take 81 mg by mouth daily.     Aspirin-Salicylamide-Caffeine (BC HEADACHE POWDER PO) Take 1 Package by mouth daily as needed (headache).     celecoxib (CELEBREX) 100 MG capsule Take 100 mg by mouth daily.     cetirizine (ZYRTEC) 10 MG tablet Take 10 mg by mouth daily.     cyclobenzaprine (FLEXERIL) 10 MG tablet Take 1 tablet (10 mg total) by mouth every 8 (eight) hours as needed for muscle spasms. 30 tablet 1   diazepam (VALIUM) 5 MG tablet Take 5 mg by mouth in the morning, at noon, and at bedtime.     EPINEPHrine (EPIPEN 2-PAK) 0.3 mg/0.3 mL IJ SOAJ injection Inject  0.3 mLs (0.3 mg total) into the muscle once as needed for up to 1 dose (for severe allergic reaction). CAll 911 immediately if you have to use this medicine 1 Device 1   estradiol (CLIMARA - DOSED IN MG/24 HR) 0.05 mg/24hr patch Place 0.05 mg onto the skin once a week.     famotidine (PEPCID) 20 MG tablet Take 1 tablet (20 mg total) by mouth 2 (two) times daily. (Patient taking differently: Take 20 mg by mouth daily as needed for heartburn.) 30 tablet 0   gabapentin (NEURONTIN) 300 MG capsule Take 300 mg by mouth daily as needed (For feet pain).     HUMIRA PEN 80 MG/0.8ML PNKT Inject 80 mg into the skin every 14 (fourteen) days.     hydroxychloroquine (PLAQUENIL) 200 MG tablet Take 200 mg by mouth daily.     hydrOXYzine (ATARAX/VISTARIL) 25 MG tablet Take 1 every 4-6 hours as needed for itch or hives. (Caution: May cause drowsiness) (Patient taking differently: Take 25 mg by mouth every 6 (six) hours as needed for itching.) 60 tablet 0   losartan (COZAAR) 100 MG tablet Take 1 tablet by mouth daily.     metFORMIN (GLUCOPHAGE) 500 MG tablet Take 500 mg by mouth 2 (two) times daily with a meal.     methocarbamol (ROBAXIN) 500 MG tablet Take 1 tablet (500 mg total) by mouth 2 (two) times daily. (Patient taking differently: Take 500 mg by mouth every 6 (six) hours as needed for muscle spasms.) 20  tablet 0   mirtazapine (REMERON) 15 MG tablet Take 15 mg by mouth at bedtime.     omalizumab Geoffry Paradise) 150 MG/ML prefilled syringe Inject 150 mg into the skin every 14 (fourteen) days.     pantoprazole (PROTONIX) 20 MG tablet Take 20 mg by mouth daily.     progesterone (PROMETRIUM) 100 MG capsule Take 100 mg by mouth daily.     RESTASIS 0.05 % ophthalmic emulsion Place 1 drop into both eyes daily as needed for dry eyes.     rosuvastatin (CRESTOR) 5 MG tablet Take 1 tablet by mouth daily.     fluconazole (DIFLUCAN) 200 MG tablet fluconazole 200 mg tablet  TAKE A TABLET BY MOUTH ONCE WEEKLY FOR 3 WEEKS. (Patient not taking: Reported on 12/22/2021)     lidocaine (LIDODERM) 5 % Place 1 patch onto the skin daily. Remove & Discard patch within 12 hours or as directed by MD (Patient not taking: Reported on 12/22/2021) 30 patch 0   spironolactone (ALDACTONE) 50 MG tablet Take 50 mg by mouth daily. (Patient not taking: Reported on 12/22/2021)      Allergies as of 12/22/2021 - Review Complete 12/22/2021  Allergen Reaction Noted   Biaxin [clarithromycin]  12/02/2015   Morphine and related  01/23/2018   Penicillins Hives, Itching, and Swelling 03/21/2013   Percocet [oxycodone-acetaminophen] Hives and Itching 12/02/2015   Shrimp [shellfish allergy]  10/01/2018   Citalopram Anxiety 04/30/2019   Topiramate Rash 12/09/2018    Family History  Problem Relation Age of Onset   Depression Mother    Mental illness Mother    Asthma Mother    COPD Mother    Diabetes Mother    Mental illness Brother    Mental illness Daughter    Diabetes Maternal Aunt    Mental illness Maternal Aunt    Depression Maternal Aunt    Diabetes Maternal Grandmother     Social History   Socioeconomic History   Marital status: Divorced  Spouse name: Not on file   Number of children: Not on file   Years of education: Not on file   Highest education level: Not on file  Occupational History   Not on file  Tobacco Use    Smoking status: Former    Packs/day: 0.50    Types: Cigars, Cigarettes   Smokeless tobacco: Never   Tobacco comments:    1 cigar daily  Vaping Use   Vaping Use: Every day   Substances: Flavoring  Substance and Sexual Activity   Alcohol use: Yes    Comment: Rare   Drug use: No   Sexual activity: Yes    Birth control/protection: Surgical  Other Topics Concern   Not on file  Social History Narrative   Not on file   Social Determinants of Health   Financial Resource Strain: Not on file  Food Insecurity: Not on file  Transportation Needs: Not on file  Physical Activity: Not on file  Stress: Not on file  Social Connections: Not on file  Intimate Partner Violence: Not on file     Review of Systems: Pertinent positive and negative review of systems were noted in the above HPI section. Complete review of systems was performed and was otherwise normal.   Physical Exam: Vital signs in last 24 hours: Temp:  [99.9 F (37.7 C)] 99.9 F (37.7 C) (05/12 1009) Pulse Rate:  [100-116] 101 (05/12 1300) Resp:  [16-21] 18 (05/12 1300) BP: (97-119)/(72-82) 119/82 (05/12 1300) SpO2:  [94 %-99 %] 94 % (05/12 1300) Weight:  [74.8 kg] 74.8 kg (05/12 1014)   Constitutional: generally well-appearing Psychiatric: alert and oriented x3 Eyes: extraocular movements intact Mouth: oral pharynx moist, no lesions Neck: supple no lymphadenopathy Cardiovascular: heart regular rate and rhythm Lungs: clear to auscultation bilaterally Abdomen: soft, significantly tender in the epigastrium, tapping lightly just about anywhere in her abdomen causes it to hurt in the epigastrium as well, nondistended, no obvious ascites, no peritoneal signs, normal bowel sounds Extremities: no lower extremity edema bilaterally Skin: no lesions on visible extremities  Lab Results: Recent Labs    12/22/21 1020  WBC 16.4*  HGB 14.7  HCT 46.1*  PLT 254  MCV 89.9   BMET Recent Labs    12/22/21 1020  NA 139  K  3.5  CL 104  CO2 26  GLUCOSE 119*  BUN 9  CREATININE 0.84  CALCIUM 8.5*   LFT Recent Labs    12/22/21 1020  BILITOT 0.6  BILIDIR <0.1  IBILI NOT CALCULATED  AST 12*  ALT 13  ALKPHOS 68  PROT 6.8  ALBUMIN 3.1*    Imaging/Other Results: DG Chest 2 View  Result Date: 12/22/2021 CLINICAL DATA:  Chest pain and shortness of breath with exertion 2 days. Back spasms and headache today. EXAM: CHEST - 2 VIEW COMPARISON:  08/28/2014 FINDINGS: Lungs are adequately inflated without focal airspace consolidation or effusion. Subtle prominence of the perihilar markings. Cardiomediastinal silhouette and remainder of the exam is unchanged. IMPRESSION: Subtle prominence of the perihilar markings which may be due to minimal vascular congestion versus viral bronchitic process. Electronically Signed   By: Elberta Fortis M.D.   On: 12/22/2021 11:07   CT Angio Chest PE W and/or Wo Contrast  Result Date: 12/22/2021 CLINICAL DATA:  Lambert Mody central chest pain that radiates to the upper back. Shortness of breath and headache. Also with epigastric pain and nausea/vomiting. EXAM: CT ANGIOGRAPHY CHEST CT ABDOMEN AND PELVIS WITH CONTRAST TECHNIQUE: Multidetector CT imaging of the  chest was performed using the standard protocol during bolus administration of intravenous contrast. Multiplanar CT image reconstructions and MIPs were obtained to evaluate the vascular anatomy. Multidetector CT imaging of the abdomen and pelvis was performed using the standard protocol during bolus administration of intravenous contrast. RADIATION DOSE REDUCTION: This exam was performed according to the departmental dose-optimization program which includes automated exposure control, adjustment of the mA and/or kV according to patient size and/or use of iterative reconstruction technique. CONTRAST:  OMNIPAQUE IOHEXOL 350 MG/ML SOLN COMPARISON:  Abdomen/pelvis CT 03/21/2013 FINDINGS: CTA CHEST FINDINGS Cardiovascular: Heart size upper  normal. No substantial pericardial effusion. No thoracic aortic aneurysm. No substantial atherosclerosis of the thoracic aorta. There is no filling defect within the opacified pulmonary arteries to suggest the presence of an acute pulmonary embolus. Mediastinum/Nodes: No mediastinal lymphadenopathy. Upper normal lymph node identified in the right hilum at 10 mm short axis. No left hilar lymphadenopathy. The esophagus has normal imaging features. There is no axillary lymphadenopathy. Lungs/Pleura: 12 mm cavitary nodule identified right lower lobe on image 82/4. No other suspicious pulmonary nodule or mass. Dependent atelectasis noted in the lower lungs. No overt pulmonary edema or substantial pleural effusion. Musculoskeletal: No worrisome lytic or sclerotic osseous abnormality. Review of the MIP images confirms the above findings. CT ABDOMEN and PELVIS FINDINGS Hepatobiliary: No suspicious focal abnormality within the liver parenchyma. There is no evidence for gallstones, gallbladder wall thickening, or pericholecystic fluid. No intrahepatic or extrahepatic biliary dilation. Pancreas: 3.3 x 2.7 x 2.6 cm complex multicystic lesion is identified along the cranial margin of the pancreatic body extending up adjacent to the stomach inferior to the esophagogastric junction (axial 22/5). Multiple adjacent enhancing lymph nodes are associated measuring in the 10-12 mm size range for short axis measurement (well seen coronal 43/8). Spleen: No splenomegaly. No focal mass lesion. Adrenals/Urinary Tract: No adrenal nodule or mass. Kidneys unremarkable. No evidence for hydroureter. Bladder is nondistended. Early excreted contrast noted in both ureters and the bladder, likely secondary to administration of trace contrast during loading of the power injector. Stomach/Bowel: Stomach is nondistended. Duodenum is normally positioned as is the ligament of Treitz. No small bowel wall thickening. No small bowel dilatation. The terminal  ileum is normal. The appendix is normal. No gross colonic mass. No colonic wall thickening. Vascular/Lymphatic: No abdominal aortic aneurysm as noted above, there is mild clustered lymphadenopathy in the region of the gastrohepatic ligament. No pelvic sidewall lymphadenopathy. Reproductive: The uterus is unremarkable. 4.2 cm benign appearing right adnexal cyst. Left ovary unremarkable. Other: No intraperitoneal free fluid. Musculoskeletal: No worrisome lytic or sclerotic osseous abnormality. Review of the MIP images confirms the above findings. IMPRESSION: 1. No CT evidence for acute pulmonary embolus. 2. 12 mm cavitary nodule identified right lower lobe. This may be infectious/inflammatory in etiology. Appearance of the nodule raises the question of septic embolus although no additional peripheral ill-defined nodules are seen in either lung. Given findings below, close follow-up will be required. 3. 3.3 x 2.7 x 2.6 cm complex multicystic lesion along the cranial margin of the pancreatic body extending up adjacent to the gastric fundus inferior to the esophagogastric junction. This is associated with a cluster of mildly enlarged lymph nodes in the same region. Imaging features may reflect an exophytic primary multicystic pancreatic process (pseudocyst or neoplasm). Cystic/necrotic lymph nodes from alternative source (lower esophagus/stomach) would also be a consideration. MRI abdomen with and without contrast might be able to add some additional insight, but upper endoscopy with endoscopic ultrasound  would also be a consideration for further assessment 4. 4.2 cm benign appearing right adnexal cyst. No follow-up warranted. Note: This recommendation excludes cysts stable ? 2 years, cysts previously characterized by [place] or MR, corpus luteum cysts and adnexal calcifications without associated soft tissue mass. This recommendation does not apply to premenarchal patients and to those with increased risk (genetic, family  history, elevated tumor markers or other high-risk factors) of ovarian cancer. Reference: JACR 2020 Feb; 17(2):248-254 right Electronically Signed   By: Kennith Center M.D.   On: 12/22/2021 14:21   CT ABDOMEN PELVIS W CONTRAST  Result Date: 12/22/2021 CLINICAL DATA:  Sharp central chest pain that radiates to the upper back. Shortness of breath and headache. Also with epigastric pain and nausea/vomiting. EXAM: CT ANGIOGRAPHY CHEST CT ABDOMEN AND PELVIS WITH CONTRAST TECHNIQUE: Multidetector CT imaging of the chest was performed using the standard protocol during bolus administration of intravenous contrast. Multiplanar CT image reconstructions and MIPs were obtained to evaluate the vascular anatomy. Multidetector CT imaging of the abdomen and pelvis was performed using the standard protocol during bolus administration of intravenous contrast. RADIATION DOSE REDUCTION: This exam was performed according to the departmental dose-optimization program which includes automated exposure control, adjustment of the mA and/or kV according to patient size and/or use of iterative reconstruction technique. CONTRAST:  OMNIPAQUE IOHEXOL 350 MG/ML SOLN COMPARISON:  Abdomen/pelvis CT 03/21/2013 FINDINGS: CTA CHEST FINDINGS Cardiovascular: Heart size upper normal. No substantial pericardial effusion. No thoracic aortic aneurysm. No substantial atherosclerosis of the thoracic aorta. There is no filling defect within the opacified pulmonary arteries to suggest the presence of an acute pulmonary embolus. Mediastinum/Nodes: No mediastinal lymphadenopathy. Upper normal lymph node identified in the right hilum at 10 mm short axis. No left hilar lymphadenopathy. The esophagus has normal imaging features. There is no axillary lymphadenopathy. Lungs/Pleura: 12 mm cavitary nodule identified right lower lobe on image 82/4. No other suspicious pulmonary nodule or mass. Dependent atelectasis noted in the lower lungs. No overt pulmonary  edema or substantial pleural effusion. Musculoskeletal: No worrisome lytic or sclerotic osseous abnormality. Review of the MIP images confirms the above findings. CT ABDOMEN and PELVIS FINDINGS Hepatobiliary: No suspicious focal abnormality within the liver parenchyma. There is no evidence for gallstones, gallbladder wall thickening, or pericholecystic fluid. No intrahepatic or extrahepatic biliary dilation. Pancreas: 3.3 x 2.7 x 2.6 cm complex multicystic lesion is identified along the cranial margin of the pancreatic body extending up adjacent to the stomach inferior to the esophagogastric junction (axial 22/5). Multiple adjacent enhancing lymph nodes are associated measuring in the 10-12 mm size range for short axis measurement (well seen coronal 43/8). Spleen: No splenomegaly. No focal mass lesion. Adrenals/Urinary Tract: No adrenal nodule or mass. Kidneys unremarkable. No evidence for hydroureter. Bladder is nondistended. Early excreted contrast noted in both ureters and the bladder, likely secondary to administration of trace contrast during loading of the power injector. Stomach/Bowel: Stomach is nondistended. Duodenum is normally positioned as is the ligament of Treitz. No small bowel wall thickening. No small bowel dilatation. The terminal ileum is normal. The appendix is normal. No gross colonic mass. No colonic wall thickening. Vascular/Lymphatic: No abdominal aortic aneurysm as noted above, there is mild clustered lymphadenopathy in the region of the gastrohepatic ligament. No pelvic sidewall lymphadenopathy. Reproductive: The uterus is unremarkable. 4.2 cm benign appearing right adnexal cyst. Left ovary unremarkable. Other: No intraperitoneal free fluid. Musculoskeletal: No worrisome lytic or sclerotic osseous abnormality. Review of the MIP images confirms the above findings.  IMPRESSION: 1. No CT evidence for acute pulmonary embolus. 2. 12 mm cavitary nodule identified right lower lobe. This may be  infectious/inflammatory in etiology. Appearance of the nodule raises the question of septic embolus although no additional peripheral ill-defined nodules are seen in either lung. Given findings below, close follow-up will be required. 3. 3.3 x 2.7 x 2.6 cm complex multicystic lesion along the cranial margin of the pancreatic body extending up adjacent to the gastric fundus inferior to the esophagogastric junction. This is associated with a cluster of mildly enlarged lymph nodes in the same region. Imaging features may reflect an exophytic primary multicystic pancreatic process (pseudocyst or neoplasm). Cystic/necrotic lymph nodes from alternative source (lower esophagus/stomach) would also be a consideration. MRI abdomen with and without contrast might be able to add some additional insight, but upper endoscopy with endoscopic ultrasound would also be a consideration for further assessment 4. 4.2 cm benign appearing right adnexal cyst. No follow-up warranted. Note: This recommendation excludes cysts stable ? 2 years, cysts previously characterized by [place] or MR, corpus luteum cysts and adnexal calcifications without associated soft tissue mass. This recommendation does not apply to premenarchal patients and to those with increased risk (genetic, family history, elevated tumor markers or other high-risk factors) of ovarian cancer. Reference: JACR 2020 Feb; 17(2):248-254 right Electronically Signed   By: Kennith Center M.D.   On: 12/22/2021 14:21      Impression/Plan: 46 y.o. female significant upper abdominal pain past 2 or 3 days in the setting of extreme NSAID use and also recent prednisone.  CT scans give me concern for a (contained?) perforated posteriorly penetrating gastric ulcer.  This would be a very unusual presentation of pancreatic cystic disease and she clearly does not have pancreatitis on imaging or by enzymes.  The cavitary lung nodule may or may not be related, certainly could be septic  emboli.  She is hemodynamically stable.  Will start IV PPI BID.  She has already been started on broad-spectrum antibiotics.  I spoke with [facility] on-call Dr. Janee Morn who will be evaluating her shortly.  We will follow along.  Rachael Fee, MD  12/22/2021, 4:59 PM [facility] Pager [phone]

## 2021-12-22 NOTE — ED Notes (Signed)
Patient ambulated to restroom independently.

## 2021-12-22 NOTE — ED Triage Notes (Signed)
Pt c/o 10/10 sharp central chest pain that radiates to upper back. Pt c/o SOB. Pt c/o HA. Pt denies N/Vx2 days.  ?

## 2021-12-22 NOTE — Consult Note (Signed)
Reason for Consult:abdominal pain ?Referring Physician: Oretha Mora ? ?Crystal Mora is an 46 y.o. female.  ?HPI: 46yo F with PMHx as below presented to the ED C/O 3d epigastric and lower chest pain. She had had some nausea. The pain worsened and she cam to the ED for evaluation. Of note, she frequently takes [facility] powders for back pain (50 per week) and sees a pain management specialist. No hematemesis and no melena. She was found on CT to have a cystic lesion of her pancreas as well as a cavitary R lung nodule.Dr. Ardis Hughs from GI evaluated her and feels she has a perforated posterior gastric ulcer and asked me to see her in consultation. ? ?Past Medical History:  ?Diagnosis Date  ? Anxiety   ? Bipolar 1 disorder ([place])   ? Chronic urticaria   ? Depression   ? Diabetes mellitus without complication ([place])   ? Hypertension   ? states she has not took BP medication for 2 years  ? Insomnia   ? Neuromuscular disorder ([place])   ? back  ? Thyroid disease   ? ? ?Past Surgical History:  ?Procedure Laterality Date  ? CESAREAN SECTION    ? THYROID SURGERY    ? TUBAL LIGATION    ? tubal reversal Bilateral   ? ? ?Family History  ?Problem Relation Age of Onset  ? Depression Mother   ? Mental illness Mother   ? Asthma Mother   ? COPD Mother   ? Diabetes Mother   ? Mental illness Brother   ? Mental illness Daughter   ? Diabetes Maternal Aunt   ? Mental illness Maternal Aunt   ? Depression Maternal Aunt   ? Diabetes Maternal Grandmother   ? ? ?Social History:  reports that she has quit smoking. Her smoking use included cigars and cigarettes. She smoked an average of .5 packs per day. She has never used smokeless tobacco. She reports current alcohol use. She reports that she does not use drugs. ? ?Allergies:  ?Allergies  ?Allergen Reactions  ? Biaxin [Clarithromycin]   ? Morphine And Related   ? Penicillins Hives, Itching and Swelling  ?  Has patient had a PCN reaction causing immediate rash, facial/tongue/throat swelling, SOB or  lightheadedness with hypotension: No ?Has patient had a PCN reaction causing severe rash involving mucus membranes or skin necrosis: No ?Has patient had a PCN reaction that required hospitalization No ?Has patient had a PCN reaction occurring within the last 10 years: Yes ?If all of the above answers are "NO", then may proceed with Cephalosporin use. ?  ? Percocet [Oxycodone-Acetaminophen] Hives and Itching  ? Shrimp [Shellfish Allergy]   ? Citalopram Anxiety  ?  aggression  ? Topiramate Rash  ? ? ?Medications: I have reviewed the patient's current medications. ? ?Results for orders placed or performed during the hospital encounter of 12/22/21 (from the past 48 hour(s))  ?Basic metabolic panel     Status: Abnormal  ? Collection Time: 12/22/21 10:20 AM  ?Result Value Ref Range  ? Sodium 139 135 - 145 mmol/L  ? Potassium 3.5 3.5 - 5.1 mmol/L  ? Chloride 104 98 - 111 mmol/L  ? CO2 26 22 - 32 mmol/L  ? Glucose, Bld 119 (H) 70 - 99 mg/dL  ?  Comment: Glucose reference range applies only to samples taken after fasting for at least 8 hours.  ? BUN 9 6 - 20 mg/dL  ? Creatinine, Ser 0.84 0.44 - 1.00 mg/dL  ? Calcium  8.5 (L) 8.9 - 10.3 mg/dL  ? GFR, Estimated >60 >60 mL/min  ?  Comment: (NOTE) ?Calculated using the CKD-EPI Creatinine Equation (2021) ?  ? Anion gap 9 5 - 15  ?  Comment: Performed at [facility], [place] [address] [place], [place] [address]  ?CBC     Status: Abnormal  ? Collection Time: 12/22/21 10:20 AM  ?Result Value Ref Range  ? WBC 16.4 (H) 4.0 - 10.5 K/uL  ? RBC 5.13 (H) 3.87 - 5.11 MIL/uL  ? Hemoglobin 14.7 12.0 - 15.0 g/dL  ? HCT 46.1 (H) 36.0 - 46.0 %  ? MCV 89.9 80.0 - 100.0 fL  ? MCH 28.7 26.0 - 34.0 pg  ? MCHC 31.9 30.0 - 36.0 g/dL  ? RDW 14.6 11.5 - 15.5 %  ? Platelets 254 150 - 400 K/uL  ? nRBC 0.0 0.0 - 0.2 %  ?  Comment: Performed at [facility], [place] [address], [place], [place] [address]  ?Troponin I (High Sensitivity)     Status: None  ? Collection Time: 12/22/21 10:20 AM  ?Result  Value Ref Range  ? Troponin I (High Sensitivity) 11 <18 ng/L  ?  Comment: (NOTE) ?Elevated high sensitivity troponin I (hsTnI) values and significant  ?changes across serial measurements may suggest ACS but many other  ?chronic and acute conditions are known to elevate hsTnI results.  ?Refer to the "Links" section for chest pain algorithms and additional  ?guidance. ?Performed at [facility], [place] [address], [place], [place] ?[address] ?  ?Lipase, blood     Status: None  ? Collection Time: 12/22/21 10:20 AM  ?Result Value Ref Range  ? Lipase 26 11 - 51 U/L  ?  Comment: Performed at [facility], [place] [address], [place], [place] [address]  ?Hepatic function panel     Status: Abnormal  ? Collection Time: 12/22/21 10:20 AM  ?Result Value Ref Range  ? Total Protein 6.8 6.5 - 8.1 g/dL  ? Albumin 3.1 (L) 3.5 - 5.0 g/dL  ? AST 12 (L) 15 - 41 U/L  ? ALT 13 0 - 44 U/L  ? Alkaline Phosphatase 68 38 - 126 U/L  ? Total Bilirubin 0.6 0.3 - 1.2 mg/dL  ? Bilirubin, Direct <0.1 0.0 - 0.2 mg/dL  ? Indirect Bilirubin NOT CALCULATED 0.3 - 0.9 mg/dL  ?  Comment: Performed at [facility], [place] [address] [place], [place] [address]  ?Resp Panel by RT-PCR (Flu A&B, Covid) Nasopharyngeal Swab     Status: None  ? Collection Time: 12/22/21 11:04 AM  ? Specimen: Nasopharyngeal Swab; Nasopharyngeal(NP) swabs in vial transport medium  ?Result Value Ref Range  ? SARS Coronavirus 2 by RT PCR NEGATIVE NEGATIVE  ?  Comment: (NOTE) ?SARS-CoV-2 target nucleic acids are NOT DETECTED. ? ?The SARS-CoV-2 RNA is generally detectable in upper respiratory ?specimens during the acute phase of infection. The lowest ?concentration of SARS-CoV-2 viral copies this assay can detect is ?138 copies/mL. A negative result does not preclude SARS-Cov-2 ?infection and should not be used as the sole basis for treatment or ?other patient management decisions. A negative result may occur with  ?improper specimen collection/handling, submission of  specimen other ?than nasopharyngeal swab, presence of viral mutation(s) within the ?areas targeted by this assay, and inadequate number of viral ?copies(<138 copies/mL). A negative result must be combined with ?clinical observations, patient history, and epidemiological ?information. The expected result is Negative. ? ?Fact Sheet for  Patients:  ?EntrepreneurPulse.com.au ? ?Fact Sheet for Healthcare Providers:  ?IncredibleEmployment.be ? ?This test is no t yet approved or cleared by the [place] FDA and  ?has been authorized for detection and/or diagnosis of SARS-CoV-2 by ?FDA under an Emergency Use Authorization (EUA). This EUA will remain  ?in effect (meaning this test can be used) for the duration of the ?COVID-19 declaration under Section 564(b)(1) of the Act, 21 ?U.S.C.section 360bbb-3(b)(1), unless the authorization is terminated  ?or revoked sooner.  ? ? ?  ? Influenza A by PCR NEGATIVE NEGATIVE  ? Influenza B by PCR NEGATIVE NEGATIVE  ?  Comment: (NOTE) ?The Xpert Xpress SARS-CoV-2/FLU/RSV plus assay is intended as an aid ?in the diagnosis of influenza from Nasopharyngeal swab specimens and ?should not be used as a sole basis for treatment. Nasal washings and ?aspirates are unacceptable for Xpert Xpress SARS-CoV-2/FLU/RSV ?testing. ? ?Fact Sheet for Patients: ?EntrepreneurPulse.com.au ? ?Fact Sheet for Healthcare Providers: ?IncredibleEmployment.be ? ?This test is not yet approved or cleared by the [place] FDA and ?has been authorized for detection and/or diagnosis of SARS-CoV-2 by ?FDA under an Emergency Use Authorization (EUA). This EUA will remain ?in effect (meaning this test can be used) for the duration of the ?COVID-19 declaration under Section 564(b)(1) of the Act, 21 U.S.C. ?section 360bbb-3(b)(1), unless the authorization is terminated or ?revoked. ? ?Performed at [facility], [place] [address] [place],  [place] ?[address] ?  ?I-Stat beta hCG blood, ED     Status: None  ? Collection Time: 12/22/21 11:06 AM  ?Result Value Ref Range  ? I-stat hCG, quantitative <5.0 <5 mIU/mL  ? Comment 3          ?  Comment:   GEST. AGE      CON

## 2021-12-23 DIAGNOSIS — K251 Acute gastric ulcer with perforation: Secondary | ICD-10-CM

## 2021-12-23 DIAGNOSIS — R1013 Epigastric pain: Secondary | ICD-10-CM

## 2021-12-23 LAB — CBC
HCT: 39.9 % (ref 36.0–46.0)
Hemoglobin: 13.1 g/dL (ref 12.0–15.0)
MCH: 29.1 pg (ref 26.0–34.0)
MCHC: 32.8 g/dL (ref 30.0–36.0)
MCV: 88.7 fL (ref 80.0–100.0)
Platelets: 216 10*3/uL (ref 150–400)
RBC: 4.5 MIL/uL (ref 3.87–5.11)
RDW: 14.5 % (ref 11.5–15.5)
WBC: 12 10*3/uL — ABNORMAL HIGH (ref 4.0–10.5)
nRBC: 0 % (ref 0.0–0.2)

## 2021-12-23 LAB — BASIC METABOLIC PANEL
Anion gap: 7 (ref 5–15)
BUN: 6 mg/dL (ref 6–20)
CO2: 25 mmol/L (ref 22–32)
Calcium: 8.2 mg/dL — ABNORMAL LOW (ref 8.9–10.3)
Chloride: 108 mmol/L (ref 98–111)
Creatinine, Ser: 0.8 mg/dL (ref 0.44–1.00)
GFR, Estimated: >60 mL/min (ref >60)
Glucose, Bld: 96 mg/dL (ref 70–99)
Potassium: 3.7 mmol/L (ref 3.5–5.1)
Sodium: 140 mmol/L (ref 135–145)

## 2021-12-23 LAB — HIV ANTIBODY (ROUTINE TESTING W REFLEX): HIV Screen 4th Generation wRfx: NONREACTIVE

## 2021-12-23 MED ORDER — PANTOPRAZOLE 80MG IVPB - SIMPLE MED
80.0000 mg | Freq: Once | INTRAVENOUS | Status: AC
  Administered 2021-12-23: 80 mg via INTRAVENOUS
  Filled 2021-12-23: qty 80

## 2021-12-23 MED ORDER — GLYCERIN (LAXATIVE) 2 G RE SUPP
1.0000 | Freq: Every day | RECTAL | Status: DC
  Administered 2021-12-23 – 2021-12-25 (×3): 1 via RECTAL
  Filled 2021-12-23: qty 12
  Filled 2021-12-23 (×6): qty 1

## 2021-12-23 MED ORDER — PANTOPRAZOLE SODIUM 40 MG IV SOLR
40.0000 mg | Freq: Two times a day (BID) | INTRAVENOUS | Status: DC
  Administered 2021-12-27 – 2021-12-28 (×3): 40 mg via INTRAVENOUS
  Filled 2021-12-23 (×3): qty 10

## 2021-12-23 MED ORDER — DIAZEPAM 5 MG/ML IJ SOLN
2.5000 mg | Freq: Two times a day (BID) | INTRAMUSCULAR | Status: DC
  Administered 2021-12-23 – 2021-12-28 (×9): 2.5 mg via INTRAVENOUS
  Filled 2021-12-23 (×10): qty 2

## 2021-12-23 MED ORDER — HYDROMORPHONE HCL 1 MG/ML IJ SOLN
0.5000 mg | INTRAMUSCULAR | Status: DC | PRN
  Administered 2021-12-23: 0.5 mg via INTRAVENOUS
  Filled 2021-12-23: qty 1

## 2021-12-23 MED ORDER — HYDROMORPHONE HCL 1 MG/ML IJ SOLN
1.0000 mg | Freq: Once | INTRAMUSCULAR | Status: AC
  Administered 2021-12-23: 1 mg via INTRAVENOUS
  Filled 2021-12-23: qty 1

## 2021-12-23 MED ORDER — LIDOCAINE 5 % EX PTCH
1.0000 | MEDICATED_PATCH | CUTANEOUS | Status: DC
  Administered 2021-12-23 – 2021-12-28 (×5): 1 via TRANSDERMAL
  Filled 2021-12-23 (×6): qty 1

## 2021-12-23 MED ORDER — PANTOPRAZOLE INFUSION (NEW) - SIMPLE MED
8.0000 mg/h | INTRAVENOUS | Status: AC
  Administered 2021-12-23 – 2021-12-25 (×7): 8 mg/h via INTRAVENOUS
  Filled 2021-12-23 (×2): qty 80
  Filled 2021-12-23: qty 100
  Filled 2021-12-23 (×4): qty 80

## 2021-12-23 MED ORDER — HYDROMORPHONE HCL 1 MG/ML IJ SOLN
0.5000 mg | INTRAMUSCULAR | Status: DC | PRN
  Administered 2021-12-23 – 2021-12-24 (×6): 0.5 mg via INTRAVENOUS
  Filled 2021-12-23 (×6): qty 1

## 2021-12-23 MED ORDER — ACETAMINOPHEN 10 MG/ML IV SOLN
1000.0000 mg | Freq: Four times a day (QID) | INTRAVENOUS | Status: AC
  Administered 2021-12-23 – 2021-12-24 (×4): 1000 mg via INTRAVENOUS
  Filled 2021-12-23 (×4): qty 100

## 2021-12-23 NOTE — Progress Notes (Signed)
Initial Nutrition Assessment ? ?DOCUMENTATION CODES:  ? ?Not applicable ? ?INTERVENTION:  ? ?- RD will monitor for diet advancement and order oral nutrition supplements as appropriate ? ?NUTRITION DIAGNOSIS:  ? ?Inadequate oral intake related to altered GI function as evidenced by NPO status. ? ?GOAL:  ? ?Patient will meet greater than or equal to 90% of their needs ? ?MONITOR:  ? ?Diet advancement, Labs, Weight trends ? ?REASON FOR ASSESSMENT:  ? ?Malnutrition Screening Tool ?  ? ?ASSESSMENT:  ? ?46 year old female who presented to the ED on 5/12 with chest pain, abdominal pain, SOB, headache. PMH of bipolar 1 disorder, anxiety, HTN, HLD, hidradenitis suppurative. Pt admitted with contained posterior perforated gastric ulcer 2/2 chronic NSAID use. ? ?RD working remotely. Unable to reach pt via phone call to room. ? ?Reviewed notes. 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 ? ?Per Surgery notes, plan is for NPO, bowel rest, PPI, and IV abx. No need for emergent surgery per Surgery MD. Plan for upper GI next week to rule out ongoing leak before resuming diet. ? ?Pt has reported a 20 lbs intentional weight loss in last 6-8 months to help control DM. Reviewed weight history in chart. Last available weight prior to this month is from February 2020. Pt with a ~12 kg weight loss since that time. Suspect weight loss was intentional and occurred more acutely based on pt report. ? ?RD will follow for diet advancement and order oral nutrition supplements as appropriate. ? ?Medications reviewed and include: glycerin suppository, IV protonix, IV acetaminophen, IV abx ?IVF: LR @ 150 ml/hr ? ?Labs reviewed: WBC 12.0 ? ?NUTRITION - FOCUSED PHYSICAL EXAM: ? ?Unable to complete at this time. RD working remotely. ? ?Diet Order:   ?Diet Order   ? ?       ?  Diet NPO time  specified  Diet effective now       ?  ? ?  ?  ? ?  ? ? ?EDUCATION NEEDS:  ? ?No education needs have been identified at this time ? ?Skin:  Skin Assessment: Reviewed RN Assessment ? ?Last BM:  12/21/21 ? ?Height:  ? ?Ht Readings from Last 1 Encounters:  ?12/22/21 5\' 6"  (1.676 m)  ? ? ?Weight:  ? ?Wt Readings from Last 1 Encounters:  ?12/22/21 74.8 kg  ? ? ?BMI:  Body mass index is 26.62 kg/m?. ? ?Estimated Nutritional Needs:  ? ?Kcal:  1900-2100 ? ?Protein:  90-110 grams ? ?Fluid:  1.9-2.1 L ? ? ? ?Gustavus Bryant, MS, RD, LDN ?Inpatient Clinical Dietitian ?Please see AMiON for contact information. ? ?

## 2021-12-23 NOTE — Progress Notes (Signed)
PT Cancellation Note ? ?Patient Details ?Name: Crystal Mora ?MRN: [id number] ?DOB: 18-Apr-1976 ? ? ?Cancelled Treatment:    Reason Eval/Treat Not Completed: PT screened, no needs identified, will sign off Patient ambulating in room independently. Patient has already had OPPT set up for her PTA for her back pain. No skilled PT needs identified acutely. PT will sign off.  ? ?[name] A. Gilford Rile, PT, DPT ?Acute Rehabilitation Services ?Pager [phone] ?Office [phone] ? ? ? ?[name] A [name] ?12/23/2021, 1:37 PM ?

## 2021-12-23 NOTE — Hospital Course (Addendum)
Crystal Mora is a 46 y.o. female who was admitted for abdominal pain thought to be posterior perforated gastric ulcer secondary to chronic NSAID abuse and steroid use.  She has a past medical history significant for bipolar depression, hypertension, hyperlipidemia, chronic back pain, hidradenitis suppurativa, anxiety.  Hospital course is listed by problem, please refer to the H&P for additional information.  Antibiotic summary: 5/12 - 5/15 metronidazole 500 mg q 12 hours 5/12 - 5/15 cefepime 2g q 8 hours 5/15 - 5/18 AM meropenem 2g q 8 hours 5/17 - 5/18 AM vancomycin 5/18 left AMA  Abdominal pain  unexplained fever Work-up in the ED notable for mild leukocytosis at 16.4. 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 Surgery was consulted who recommend no surgical intervention necessary given barium swallow finding of no gastric extravasation making gastric perforation less likely. On hospital day 3, patient was started on broad antibiotics of metronidazole and cefepime due to new onset fever with abdominal pain.  Antibiotics was eventually transitioned to IV meropenem per ID for abdominal coverage. GI was consulted due to concerns of pancreatic etiology given patient's CT finding of multicystic lesions of the pancreas and continuous abdominal pain with fever.  Her fever curve improved on IV meropenem however patient continued to fever despite IV antibiotics and scheduled IV tylenol with no clear source of infection.  Echocardiogram, CXR and lumbar XR (for back pain) were unremarkable and thorough skin exam showed no lesions. Given elevated inflammatory markers and persistent fever, ID recommended UE and LE Doppler and starting patient on Vancomycin due to concerns for possible thrombophlebitis.  UE doppler was unremarkable and LE was pending prior to pt leaving.  She was afebrile on the 18th  and pain was much improved but she left the hospital AMA for family responsibilities. Per ID recommendation linezolid and cephalexin was sent to her home pharmacy which patient was advised to take for 10 days and also continue PPI and Carafate.  Patient was called via phone and informed her medication was sent to her home pharmacy and instructed on medication administration. Return precautions were reviewed and patient verbalized understanding. GI recommends outpatient EUS for pancreatic lesion and gastric evaluation.  Cavitary lung lesion 12 mm cavitary nodule seen in right lower lobe on CTA chest.  This was thought to be infectious (TB) versus inflammatory VS malignancy. Less likely TB given location of the lesion and no preceding history or increased risk. Low suspicion for malignancy  given patient's age and remote tobacco use history.  Radiologist recommended follow-up CT chest in 3 months as an outpatient.  Quant gold was collected during hospitalization and pending prior to patient leaving AMA. Pulmonology consulted, but declined sampling nodule given high risk and low expected benefit. Expected result from [facility] 5/20. Patient is recommended to follow up with outpatient pulmonologist for further assessment.  PCP follow up Issues: Continue to counsel on strict NSAID avoidance Consider TSH check given hx partial thyroidectomy  Incidental 4.2 cm diameter RIGHT ovarian cyst found on CT; follow-up ultrasound recommended in 3-6 months. CT chest recommended in 3 months to follow up on cavitary lung lesion in RLL. Need outpatient pulmonologist. PCP to follow up on quantiferon gold TB test collected inpatient (expected result date from [facility] 5/20). Per GI, Eventually plan for outpatient MRI pancreas protocol with subsequent EUS findings

## 2021-12-23 NOTE — Progress Notes (Addendum)
Patient ID: Crystal Mora, female   DOB: 01-Jul-1976, 46 y.o.   MRN: [id number] .   Progress Note   Subjective   Day # 2  CC; severe upper abdominal pain, abnormal CT  IV PPI IV antibiotics-cefepime/metronidazole  Patient says as long as she has the IV pain medication she is feeling okay, when the pain medication wears off pain recurs and is at same level as yesterday.  No nausea or vomiting  Tmax 99.9  WBC 12.0/hemoglobin 13.1/hematocrit 39.9 H. pylori IgG pending  Objective   Vital signs in last 24 hours: Temp:  [98.4 F (36.9 C)-99.3 F (37.4 C)] 99.3 F (37.4 C) (05/13 0923) Pulse Rate:  [93-105] 102 (05/13 0923) Resp:  [14-23] 18 (05/13 0923) BP: (97-142)/(65-82) 142/82 (05/13 0923) SpO2:  [94 %-100 %] 100 % (05/13 0923) Last BM Date : 12/21/21 General:   Well-developed African-American female in NAD Heart:  Regular rate and rhythm; no murmurs Lungs: Respirations even and unlabored, lungs CTA bilaterally Abdomen:  Soft, mildly tender across the upper abdomen, nondistended. Normal bowel sounds.  No rebound currently Extremities:  Without edema. Neurologic:  Alert and oriented,  grossly normal neurologically. Psych:  Cooperative. Normal mood and affect.  Intake/Output from previous day: 05/12 0701 - 05/13 0700 In: 676.3 [I.V.:346.5; IV Piggyback:329.8] Out: 0  Intake/Output this shift: No intake/output data recorded.  Lab Results: Recent Labs    12/22/21 1020 12/23/21 0233  WBC 16.4* 12.0*  HGB 14.7 13.1  HCT 46.1* 39.9  PLT 254 216   BMET Recent Labs    12/22/21 1020 12/23/21 0233  NA 139 140  K 3.5 3.7  CL 104 108  CO2 26 25  GLUCOSE 119* 96  BUN 9 6  CREATININE 0.84 0.80  CALCIUM 8.5* 8.2*   LFT Recent Labs    12/22/21 1020  PROT 6.8  ALBUMIN 3.1*  AST 12*  ALT 13  ALKPHOS 68  BILITOT 0.6  BILIDIR <0.1  IBILI NOT CALCULATED   PT/INR No results for input(s): LABPROT, INR in the last 72 hours.  Studies/Results: DG Chest 2  View  Result Date: 12/22/2021 CLINICAL DATA:  Chest pain and shortness of breath with exertion 2 days. Back spasms and headache today. EXAM: CHEST - 2 VIEW COMPARISON:  08/28/2014 FINDINGS: Lungs are adequately inflated without focal airspace consolidation or effusion. Subtle prominence of the perihilar markings. Cardiomediastinal silhouette and remainder of the exam is unchanged. IMPRESSION: Subtle prominence of the perihilar markings which may be due to minimal vascular congestion versus viral bronchitic process. Electronically Signed   By: Elberta Fortis M.D.   On: 12/22/2021 11:07   CT Angio Chest PE W and/or Wo Contrast  Result Date: 12/22/2021 CLINICAL DATA:  Lambert Mody central chest pain that radiates to the upper back. Shortness of breath and headache. Also with epigastric pain and nausea/vomiting. EXAM: CT ANGIOGRAPHY CHEST CT ABDOMEN AND PELVIS WITH CONTRAST TECHNIQUE: Multidetector CT imaging of the chest was performed using the standard protocol during bolus administration of intravenous contrast. Multiplanar CT image reconstructions and MIPs were obtained to evaluate the vascular anatomy. Multidetector CT imaging of the abdomen and pelvis was performed using the standard protocol during bolus administration of intravenous contrast. RADIATION DOSE REDUCTION: This exam was performed according to the departmental dose-optimization program which includes automated exposure control, adjustment of the mA and/or kV according to patient size and/or use of iterative reconstruction technique. CONTRAST:  OMNIPAQUE IOHEXOL 350 MG/ML SOLN COMPARISON:  Abdomen/pelvis CT 03/21/2013 FINDINGS: CTA CHEST  FINDINGS Cardiovascular: Heart size upper normal. No substantial pericardial effusion. No thoracic aortic aneurysm. No substantial atherosclerosis of the thoracic aorta. There is no filling defect within the opacified pulmonary arteries to suggest the presence of an acute pulmonary embolus. Mediastinum/Nodes: No  mediastinal lymphadenopathy. Upper normal lymph node identified in the right hilum at 10 mm short axis. No left hilar lymphadenopathy. The esophagus has normal imaging features. There is no axillary lymphadenopathy. Lungs/Pleura: 12 mm cavitary nodule identified right lower lobe on image 82/4. No other suspicious pulmonary nodule or mass. Dependent atelectasis noted in the lower lungs. No overt pulmonary edema or substantial pleural effusion. Musculoskeletal: No worrisome lytic or sclerotic osseous abnormality. Review of the MIP images confirms the above findings. CT ABDOMEN and PELVIS FINDINGS Hepatobiliary: No suspicious focal abnormality within the liver parenchyma. There is no evidence for gallstones, gallbladder wall thickening, or pericholecystic fluid. No intrahepatic or extrahepatic biliary dilation. Pancreas: 3.3 x 2.7 x 2.6 cm complex multicystic lesion is identified along the cranial margin of the pancreatic body extending up adjacent to the stomach inferior to the esophagogastric junction (axial 22/5). Multiple adjacent enhancing lymph nodes are associated measuring in the 10-12 mm size range for short axis measurement (well seen coronal 43/8). Spleen: No splenomegaly. No focal mass lesion. Adrenals/Urinary Tract: No adrenal nodule or mass. Kidneys unremarkable. No evidence for hydroureter. Bladder is nondistended. Early excreted contrast noted in both ureters and the bladder, likely secondary to administration of trace contrast during loading of the power injector. Stomach/Bowel: Stomach is nondistended. Duodenum is normally positioned as is the ligament of Treitz. No small bowel wall thickening. No small bowel dilatation. The terminal ileum is normal. The appendix is normal. No gross colonic mass. No colonic wall thickening. Vascular/Lymphatic: No abdominal aortic aneurysm as noted above, there is mild clustered lymphadenopathy in the region of the gastrohepatic ligament. No pelvic sidewall  lymphadenopathy. Reproductive: The uterus is unremarkable. 4.2 cm benign appearing right adnexal cyst. Left ovary unremarkable. Other: No intraperitoneal free fluid. Musculoskeletal: No worrisome lytic or sclerotic osseous abnormality. Review of the MIP images confirms the above findings. IMPRESSION: 1. No CT evidence for acute pulmonary embolus. 2. 12 mm cavitary nodule identified right lower lobe. This may be infectious/inflammatory in etiology. Appearance of the nodule raises the question of septic embolus although no additional peripheral ill-defined nodules are seen in either lung. Given findings below, close follow-up will be required. 3. 3.3 x 2.7 x 2.6 cm complex multicystic lesion along the cranial margin of the pancreatic body extending up adjacent to the gastric fundus inferior to the esophagogastric junction. This is associated with a cluster of mildly enlarged lymph nodes in the same region. Imaging features may reflect an exophytic primary multicystic pancreatic process (pseudocyst or neoplasm). Cystic/necrotic lymph nodes from alternative source (lower esophagus/stomach) would also be a consideration. MRI abdomen with and without contrast might be able to add some additional insight, but upper endoscopy with endoscopic ultrasound would also be a consideration for further assessment 4. 4.2 cm benign appearing right adnexal cyst. No follow-up warranted. Note: This recommendation excludes cysts stable ? 2 years, cysts previously characterized by [place] or MR, corpus luteum cysts and adnexal calcifications without associated soft tissue mass. This recommendation does not apply to premenarchal patients and to those with increased risk (genetic, family history, elevated tumor markers or other high-risk factors) of ovarian cancer. Reference: JACR 2020 Feb; 17(2):248-254 right Electronically Signed   By: Kennith Center M.D.   On: 12/22/2021 14:21   CT  ABDOMEN PELVIS W CONTRAST  Result Date: 12/22/2021 CLINICAL  DATA:  Lambert Mody central chest pain that radiates to the upper back. Shortness of breath and headache. Also with epigastric pain and nausea/vomiting. EXAM: CT ANGIOGRAPHY CHEST CT ABDOMEN AND PELVIS WITH CONTRAST TECHNIQUE: Multidetector CT imaging of the chest was performed using the standard protocol during bolus administration of intravenous contrast. Multiplanar CT image reconstructions and MIPs were obtained to evaluate the vascular anatomy. Multidetector CT imaging of the abdomen and pelvis was performed using the standard protocol during bolus administration of intravenous contrast. RADIATION DOSE REDUCTION: This exam was performed according to the departmental dose-optimization program which includes automated exposure control, adjustment of the mA and/or kV according to patient size and/or use of iterative reconstruction technique. CONTRAST:  OMNIPAQUE IOHEXOL 350 MG/ML SOLN COMPARISON:  Abdomen/pelvis CT 03/21/2013 FINDINGS: CTA CHEST FINDINGS Cardiovascular: Heart size upper normal. No substantial pericardial effusion. No thoracic aortic aneurysm. No substantial atherosclerosis of the thoracic aorta. There is no filling defect within the opacified pulmonary arteries to suggest the presence of an acute pulmonary embolus. Mediastinum/Nodes: No mediastinal lymphadenopathy. Upper normal lymph node identified in the right hilum at 10 mm short axis. No left hilar lymphadenopathy. The esophagus has normal imaging features. There is no axillary lymphadenopathy. Lungs/Pleura: 12 mm cavitary nodule identified right lower lobe on image 82/4. No other suspicious pulmonary nodule or mass. Dependent atelectasis noted in the lower lungs. No overt pulmonary edema or substantial pleural effusion. Musculoskeletal: No worrisome lytic or sclerotic osseous abnormality. Review of the MIP images confirms the above findings. CT ABDOMEN and PELVIS FINDINGS Hepatobiliary: No suspicious focal abnormality within the liver  parenchyma. There is no evidence for gallstones, gallbladder wall thickening, or pericholecystic fluid. No intrahepatic or extrahepatic biliary dilation. Pancreas: 3.3 x 2.7 x 2.6 cm complex multicystic lesion is identified along the cranial margin of the pancreatic body extending up adjacent to the stomach inferior to the esophagogastric junction (axial 22/5). Multiple adjacent enhancing lymph nodes are associated measuring in the 10-12 mm size range for short axis measurement (well seen coronal 43/8). Spleen: No splenomegaly. No focal mass lesion. Adrenals/Urinary Tract: No adrenal nodule or mass. Kidneys unremarkable. No evidence for hydroureter. Bladder is nondistended. Early excreted contrast noted in both ureters and the bladder, likely secondary to administration of trace contrast during loading of the power injector. Stomach/Bowel: Stomach is nondistended. Duodenum is normally positioned as is the ligament of Treitz. No small bowel wall thickening. No small bowel dilatation. The terminal ileum is normal. The appendix is normal. No gross colonic mass. No colonic wall thickening. Vascular/Lymphatic: No abdominal aortic aneurysm as noted above, there is mild clustered lymphadenopathy in the region of the gastrohepatic ligament. No pelvic sidewall lymphadenopathy. Reproductive: The uterus is unremarkable. 4.2 cm benign appearing right adnexal cyst. Left ovary unremarkable. Other: No intraperitoneal free fluid. Musculoskeletal: No worrisome lytic or sclerotic osseous abnormality. Review of the MIP images confirms the above findings. IMPRESSION: 1. No CT evidence for acute pulmonary embolus. 2. 12 mm cavitary nodule identified right lower lobe. This may be infectious/inflammatory in etiology. Appearance of the nodule raises the question of septic embolus although no additional peripheral ill-defined nodules are seen in either lung. Given findings below, close follow-up will be required. 3. 3.3 x 2.7 x 2.6 cm  complex multicystic lesion along the cranial margin of the pancreatic body extending up adjacent to the gastric fundus inferior to the esophagogastric junction. This is associated with a cluster of mildly enlarged lymph nodes in  the same region. Imaging features may reflect an exophytic primary multicystic pancreatic process (pseudocyst or neoplasm). Cystic/necrotic lymph nodes from alternative source (lower esophagus/stomach) would also be a consideration. MRI abdomen with and without contrast might be able to add some additional insight, but upper endoscopy with endoscopic ultrasound would also be a consideration for further assessment 4. 4.2 cm benign appearing right adnexal cyst. No follow-up warranted. Note: This recommendation excludes cysts stable ? 2 years, cysts previously characterized by [place] or MR, corpus luteum cysts and adnexal calcifications without associated soft tissue mass. This recommendation does not apply to premenarchal patients and to those with increased risk (genetic, family history, elevated tumor markers or other high-risk factors) of ovarian cancer. Reference: JACR 2020 Feb; 17(2):248-254 right Electronically Signed   By: Kennith Center M.D.   On: 12/22/2021 14:21       Assessment / Plan:    #47 46 year old African-American female admitted with 2 to 3-day history of acute upper abdominal pain in setting of high-dose BC powder use  Imaging most consistent with contained/perforated gastric ulcer.  Patient has been stable, has had low-grade temps  Plan; continue IV cefepime and metronidazole Continue n.p.o. Continue Protonix 40 mg every 12 hours IV Surgery is following  No indication for EGD at present, if she fails to improve will need surgical management GI will sign off, available if needed  Long discussion with patient this morning regarding her chronic headaches, she is aware she needs to discontinue BC powder, and all NSAID and aspirin use moving forward.  She had been  established with a headache clinic previously and will need to follow-up with them once she recovers.    Principal Problem:   Epigastric pain     LOS: 1 day   Amy Esterwood PA-C 12/23/2021, 11:08 AM  I have taken an interval history, thoroughly reviewed the chart and examined the patient. I agree with the Advanced Practitioner's note, impression and recommendations, and have recorded additional findings, impressions and recommendations below. I performed a substantive portion of this encounter (>50% time spent), including a complete performance of the medical decision making.  My additional thoughts are as follows:  Patient is stable, has significant upper abdominal tenderness with guarding, not have distention or loss of bowel sounds.  She is in good spirits, pain is well controlled and she is with family and holding her grandchild.  WBC 12.0, hemoglobin remains normal at 13, no overt GI bleeding reported  Contained perforation posterior gastric wall ulcer, has been evaluated by surgery, currently n.p.o. on antibiotics.  No current need for upper endoscopy.  H. pylori serology was ordered and results pending.  Nothing further to add from a GI standpoint, surgery will guide the management at this point, so our service will sign off and you may contact us as needed.  Treatment appropriate antibiotics if H. pylori is positive.   Charlie Pitter III Office:[phone]
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
## 2021-12-23 NOTE — Progress Notes (Signed)
FPTS Brief Progress Note ? ?S:Patient laying in bed comfortably asleep ? ?O: ?BP 111/72 (BP Location: Left Arm)   Pulse 93   Temp 98.9 ?F (37.2 ?C)   Resp 18   Ht 5\' 6"  (1.676 m)   Wt 74.8 kg   SpO2 100%   BMI 26.62 kg/m?   ?Gen: NAD, asleep ?Resp: breathing comfortably ? ?A/P: ?NPO currently ?- Orders reviewed. Labs for AM ordered, which was adjusted as needed.  ? ?Gerrit Heck, MD ?12/23/2021, 2:51 AM ?PGY-1, [place] Night Resident  ?Please page [phone] with questions.  ?  ?

## 2021-12-23 NOTE — Progress Notes (Signed)
FPTS Brief Progress Note ? ?S:Went to room to assess patient c/o 10/10 abdominal pain to nursing. Says the morphine worked initially but on second dose did not help at all. Says that she is feeling anxious. Last got valium at 2345.  ? ?O: ?BP 111/72 (BP Location: Left Arm)   Pulse 93   Temp 98.9 ?F (37.2 ?C)   Resp 18   Ht 5\' 6"  (1.676 m)   Wt 74.8 kg   SpO2 100%   BMI 26.62 kg/m?   ?Gen: moderately distressed, crying, anxious ?Abd: Soft, nondistended, tender to palpation to light touch in epigastrium, BS normoactive ? ?A/P: ?Posterior perforated gastric ulcer 2/2 chronic NSAID use ?Will try dilaudid 1 mg x 1. If pain does not improve will plan to discuss with general surgery.  ?-serial abdominal examinations  ?-NPO/bowel rest ? ?Gerrit Heck, MD ?12/23/2021, 4:18 AM ?PGY-1, [facility] Night Resident  ?Please page [phone] with questions.  ?  ?

## 2021-12-23 NOTE — Progress Notes (Signed)
Consulted for 2nd site IV.   Pt. Refuses.  Staff RN came in and spoke with patient.  Continues to refuse.   ?
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
## 2021-12-23 NOTE — Evaluation (Signed)
Occupational Therapy Evaluation ?Patient Details ?Name: Crystal Mora ?MRN: [id number] ?DOB: 1976/04/14 ?Today's Date: 12/23/2021 ? ? ?History of Present Illness Crystal Mora is a 46 y.o. female who presented with chest and abdominal pain, found to have perforated gastric ulcer. PMHx significant for bipolar depression, HTN, HLD.  ? ?Clinical Impression ?  ?Patient evaluated by Occupational Therapy with no further acute OT needs identified. All education has been completed and the patient has no further questions.  See below for any follow-up Occupational Therapy or equipment needs. OT is signing off. Thank you for this referral. ? ?   ? ?Recommendations for follow up therapy are one component of a multi-disciplinary discharge planning process, led by the attending physician.  Recommendations may be updated based on patient status, additional functional criteria and insurance authorization.  ? ?Follow Up Recommendations ? No OT follow up  ?  ?Assistance Recommended at Discharge PRN  ?Patient can return home with the following   ? ?  ?Functional Status Assessment ? Patient has not had a recent decline in their functional status  ?Equipment Recommendations ? BSC/3in1  ?  ?Recommendations for Other Services   ? ? ?  ?Precautions / Restrictions Precautions ?Precaution Comments: NPO ?Restrictions ?Weight Bearing Restrictions: No  ? ?  ? ?Mobility Bed Mobility ?Overal bed mobility: Modified Independent ?  ?  ?  ?  ?  ?  ?  ?  ? ?Transfers ?Overall transfer level: Independent ?  ?  ?  ?  ?  ?  ?  ?  ?  ?  ? ?  ?Balance Overall balance assessment: Modified Independent ?  ?  ?  ?  ?  ?  ?  ?  ?  ?  ?  ?  ?  ?  ?  ?  ?  ?  ?   ? ?ADL either performed or assessed with clinical judgement  ? ?ADL Overall ADL's : At baseline ?  ?  ?  ?  ?  ?  ?  ?  ?  ?  ?  ?  ?  ?  ?  ?  ?  ?  ?  ?General ADL Comments: Pt able to demonstrate figure 4 position for LB dressing and educated on this method to avoid excessive trunk flexion and  possible pain exacerbation. Pt educated on log roll technique in attempt to manage ABD pain, however pt reported this did not make a difference with pain but able to perform bed mobility Mod I. Pt demonstrated toileting and toilet transfer, standing for functional tasks all Independently accept for assist to manage IV. Pt did have increased pain and struggle with transfer from low toilet, and shown how to use BSC as toilet riser and as shower chair. Pt educated on clamp on tub bar as safer alternative to her suction cup grab bars and pt very receptive to all education.  ? ? ? ?Vision Baseline Vision/History: 1 Wears glasses ?Ability to See in Adequate Light: 1 Impaired ?Patient Visual Report: No change from baseline ?Vision Assessment?: No apparent visual deficits ?Additional Comments: New bifocals being picked up today.  ?   ?Perception Perception ?Perception: Within Functional Limits ?  ?Praxis Praxis ?Praxis: Intact ?  ? ?Pertinent Vitals/Pain Pain Assessment ?Pain Assessment: 0-10 ?Pain Score: 10-Worst pain ever ?Pain Location: ABD ?Pain Descriptors / Indicators: Sharp, Stabbing ?Pain Intervention(s): Limited activity within patient's tolerance, Monitored during session  ? ? ? ?Hand Dominance Right ?  ?Extremity/Trunk Assessment Upper Extremity  Assessment ?Upper Extremity Assessment: Overall WFL for tasks assessed ?  ?Lower Extremity Assessment ?Lower Extremity Assessment: Overall WFL for tasks assessed ?  ?Cervical / Trunk Assessment ?Cervical / Trunk Assessment: Normal ?  ?Communication Communication ?Communication: No difficulties ?  ?Cognition Arousal/Alertness: Awake/alert ?Behavior During Therapy: [facility] for tasks assessed/performed ?Overall Cognitive Status: Within Functional Limits for tasks assessed ?  ?  ?  ?  ?  ?  ?  ?  ?  ?  ?  ?  ?  ?  ?  ?  ?  ?  ?  ?General Comments    ? ?  ?Exercises   ?  ?Shoulder Instructions    ? ? ?Home Living Family/patient expects to be discharged to:: Private  residence ?Living Arrangements: Spouse/significant other;Children;Other (Comment) (10 family members all at home.) ?Available Help at Discharge: Available 24 hours/day ?Type of Home: House ?Home Access: Stairs to enter ?Entrance Stairs-Number of Steps: 1 ?Entrance Stairs-Rails: None ?Home Layout: Two level;Full bath on main level;Able to live on main level with bedroom/bathroom ?Alternate Level Stairs-Number of Steps: Basement is for pt's mother. Pt does not have to access. ?  ?Bathroom Shower/Tub: Tub/shower unit ?  ?Bathroom Toilet: Standard (can push from sink) ?  ?  ?  ?  ?Additional Comments: suction grab bar- pt verbalized awareness that they are not safe. ?  ? ?  ?Prior Functioning/Environment Prior Level of Function : Independent/Modified Independent ?  ?  ?  ?  ?  ?  ?  ?  ?  ? ?  ?  ?OT Problem List: Pain ?  ?   ?OT Treatment/Interventions:    ?  ?OT Goals(Current goals can be found in the care plan section) Acute Rehab OT Goals ?Patient Stated Goal: Pain to resolve. ?OT Goal Formulation: All assessment and education complete, DC therapy  ?OT Frequency:   ?  ? ?Co-evaluation   ?  ?  ?  ?  ? ?  ?AM-PAC OT "6 Clicks" Daily Activity     ?Outcome Measure Help from another person eating meals?: None (NPO, but has skills for self feeding intact) ?Help from another person taking care of personal grooming?: None ?Help from another person toileting, which includes using toliet, bedpan, or urinal?: None ?Help from another person bathing (including washing, rinsing, drying)?: None ?Help from another person to put on and taking off regular upper body clothing?: None ?Help from another person to put on and taking off regular lower body clothing?: None ?6 Click Score: 24 ?  ?End of Session Nurse Communication: Mobility status (Pt in bathroom. OT signing off.) ? ?Activity Tolerance: Patient tolerated treatment well ?Patient left: Other (comment) (Pt ambulated back to bathroom. RN notified.) ? ?OT Visit Diagnosis:  Pain ?Pain - part of body:  (ABD)  ?              ?Time: 6837-2902 ?OT Time Calculation (min): 20 min ?Charges:  OT General Charges ?$OT Visit: 1 Visit ?OT Evaluation ?$OT Eval Low Complexity: 1 Low ? ?Anderson Malta, OT ?Acute Rehab Services ?Office: [phone] ?12/23/2021 ? ?Julien Girt ?12/23/2021, 10:25 AM ?

## 2021-12-23 NOTE — Progress Notes (Signed)
? ? ?   ?Subjective: ?CC: ?Stable epigastric pain. No n/v. Passing flatus.  ? ?Objective: ?Vital signs in last 24 hours: ?Temp:  [98.4 ?F (36.9 ?C)-99.9 ?F (37.7 ?C)] 98.4 ?F (36.9 ?C) (05/13 0102) ?Pulse Rate:  [93-116] 99 (05/13 0558) ?Resp:  [14-23] 18 (05/13 0558) ?BP: (97-130)/(65-82) 130/76 (05/13 0558) ?SpO2:  [94 %-100 %] 100 % (05/13 0558) ?Weight:  [74.8 kg] 74.8 kg (05/12 1014) ?Last BM Date : 12/21/21 ? ?Intake/Output from previous day: ?05/12 0701 - 05/13 0700 ?In: 676.3 [I.V.:346.5; IV Piggyback:329.8] ?Out: 0  ?Intake/Output this shift: ?No intake/output data recorded. ? ?PE: ?Gen:  Alert, NAD, pleasant ?Pulm: rate and effort normal ?Abd: Soft, ND, epigastric tenderness without rigidity or guarding. Otherwise NT.+BS ?Psych: A&Ox3  ? ?Lab Results:  ?Recent Labs  ?  12/22/21 ?1020 12/23/21 ?0233  ?WBC 16.4* 12.0*  ?HGB 14.7 13.1  ?HCT 46.1* 39.9  ?PLT 254 216  ? ?BMET ?Recent Labs  ?  12/22/21 ?1020 12/23/21 ?0233  ?NA 139 140  ?K 3.5 3.7  ?CL 104 108  ?CO2 26 25  ?GLUCOSE 119* 96  ?BUN 9 6  ?CREATININE 0.84 0.80  ?CALCIUM 8.5* 8.2*  ? ?PT/INR ?No results for input(s): LABPROT, INR in the last 72 hours. ?CMP  ?   ?Component Value Date/Time  ? NA 140 12/23/2021 0233  ? K 3.7 12/23/2021 0233  ? CL 108 12/23/2021 0233  ? CO2 25 12/23/2021 0233  ? GLUCOSE 96 12/23/2021 0233  ? BUN 6 12/23/2021 0233  ? CREATININE 0.80 12/23/2021 0233  ? CALCIUM 8.2 (L) 12/23/2021 0233  ? PROT 6.8 12/22/2021 1020  ? ALBUMIN 3.1 (L) 12/22/2021 1020  ? AST 12 (L) 12/22/2021 1020  ? ALT 13 12/22/2021 1020  ? ALKPHOS 68 12/22/2021 1020  ? BILITOT 0.6 12/22/2021 1020  ? GFRNONAA >60 12/23/2021 0233  ? GFRAA >60 10/19/2016 2000  ? ?Lipase  ?   ?Component Value Date/Time  ? LIPASE 26 12/22/2021 1020  ? ? ?Studies/Results: ?DG Chest 2 View ? ?Result Date: 12/22/2021 ?CLINICAL DATA:  Chest pain and shortness of breath with exertion 2 days. Back spasms and headache today. EXAM: CHEST - 2 VIEW COMPARISON:  08/28/2014 FINDINGS: Lungs are  adequately inflated without focal airspace consolidation or effusion. Subtle prominence of the perihilar markings. Cardiomediastinal silhouette and remainder of the exam is unchanged. IMPRESSION: Subtle prominence of the perihilar markings which may be due to minimal vascular congestion versus viral bronchitic process. Electronically Signed   By: Marin Olp M.D.   On: 12/22/2021 11:07  ? ?CT Angio Chest PE W and/or Wo Contrast ? ?Result Date: 12/22/2021 ?CLINICAL DATA:  Sharp central chest pain that radiates to the upper back. Shortness of breath and headache. Also with epigastric pain and nausea/vomiting. EXAM: CT ANGIOGRAPHY CHEST CT ABDOMEN AND PELVIS WITH CONTRAST TECHNIQUE: Multidetector CT imaging of the chest was performed using the standard protocol during bolus administration of intravenous contrast. Multiplanar CT image reconstructions and MIPs were obtained to evaluate the vascular anatomy. Multidetector CT imaging of the abdomen and pelvis was performed using the standard protocol during bolus administration of intravenous contrast. RADIATION DOSE REDUCTION: This exam was performed according to the departmental dose-optimization program which includes automated exposure control, adjustment of the mA and/or kV according to patient size and/or use of iterative reconstruction technique. CONTRAST:  118mL OMNIPAQUE IOHEXOL 350 MG/ML SOLN COMPARISON:  Abdomen/pelvis CT 03/21/2013 FINDINGS: CTA CHEST FINDINGS Cardiovascular: Heart size upper normal. No substantial pericardial effusion. No thoracic aortic  aneurysm. No substantial atherosclerosis of the thoracic aorta. There is no filling defect within the opacified pulmonary arteries to suggest the presence of an acute pulmonary embolus. Mediastinum/Nodes: No mediastinal lymphadenopathy. Upper normal lymph node identified in the right hilum at 10 mm short axis. No left hilar lymphadenopathy. The esophagus has normal imaging features. There is no axillary  lymphadenopathy. Lungs/Pleura: 12 mm cavitary nodule identified right lower lobe on image 82/4. No other suspicious pulmonary nodule or mass. Dependent atelectasis noted in the lower lungs. No overt pulmonary edema or substantial pleural effusion. Musculoskeletal: No worrisome lytic or sclerotic osseous abnormality. Review of the MIP images confirms the above findings. CT ABDOMEN and PELVIS FINDINGS Hepatobiliary: No suspicious focal abnormality within the liver parenchyma. There is no evidence for gallstones, gallbladder wall thickening, or pericholecystic fluid. No intrahepatic or extrahepatic biliary dilation. Pancreas: 3.3 x 2.7 x 2.6 cm complex multicystic lesion is identified along the cranial margin of the pancreatic body extending up adjacent to the stomach inferior to the esophagogastric junction (axial 22/5). Multiple adjacent enhancing lymph nodes are associated measuring in the 10-12 mm size range for short axis measurement (well seen coronal 43/8). Spleen: No splenomegaly. No focal mass lesion. Adrenals/Urinary Tract: No adrenal nodule or mass. Kidneys unremarkable. No evidence for hydroureter. Bladder is nondistended. Early excreted contrast noted in both ureters and the bladder, likely secondary to administration of trace contrast during loading of the power injector. Stomach/Bowel: Stomach is nondistended. Duodenum is normally positioned as is the ligament of Treitz. No small bowel wall thickening. No small bowel dilatation. The terminal ileum is normal. The appendix is normal. No gross colonic mass. No colonic wall thickening. Vascular/Lymphatic: No abdominal aortic aneurysm as noted above, there is mild clustered lymphadenopathy in the region of the gastrohepatic ligament. No pelvic sidewall lymphadenopathy. Reproductive: The uterus is unremarkable. 4.2 cm benign appearing right adnexal cyst. Left ovary unremarkable. Other: No intraperitoneal free fluid. Musculoskeletal: No worrisome lytic or  sclerotic osseous abnormality. Review of the MIP images confirms the above findings. IMPRESSION: 1. No CT evidence for acute pulmonary embolus. 2. 12 mm cavitary nodule identified right lower lobe. This may be infectious/inflammatory in etiology. Appearance of the nodule raises the question of septic embolus although no additional peripheral ill-defined nodules are seen in either lung. Given findings below, close follow-up will be required. 3. 3.3 x 2.7 x 2.6 cm complex multicystic lesion along the cranial margin of the pancreatic body extending up adjacent to the gastric fundus inferior to the esophagogastric junction. This is associated with a cluster of mildly enlarged lymph nodes in the same region. Imaging features may reflect an exophytic primary multicystic pancreatic process (pseudocyst or neoplasm). Cystic/necrotic lymph nodes from alternative source (lower esophagus/stomach) would also be a consideration. MRI abdomen with and without contrast might be able to add some additional insight, but upper endoscopy with endoscopic ultrasound would also be a consideration for further assessment 4. 4.2 cm benign appearing right adnexal cyst. No follow-up warranted. Note: This recommendation excludes cysts stable ? 2 years, cysts previously characterized by [place] or MR, corpus luteum cysts and adnexal calcifications without associated soft tissue mass. This recommendation does not apply to premenarchal patients and to those with increased risk (genetic, family history, elevated tumor markers or other high-risk factors) of ovarian cancer. Reference: JACR 2020 Feb; 17(2):248-254 right Electronically Signed   By: Misty Stanley M.D.   On: 12/22/2021 14:21  ? ?CT ABDOMEN PELVIS W CONTRAST ? ?Result Date: 12/22/2021 ?CLINICAL DATA:  Hervey Ard central  chest pain that radiates to the upper back. Shortness of breath and headache. Also with epigastric pain and nausea/vomiting. EXAM: CT ANGIOGRAPHY CHEST CT ABDOMEN AND PELVIS WITH  CONTRAST TECHNIQUE: Multidetector CT imaging of the chest was performed using the standard protocol during bolus administration of intravenous contrast. Multiplanar CT image reconstructions and MIPs were obtained

## 2021-12-23 NOTE — Progress Notes (Signed)
FPTS Brief Progress Note ? ?S:Patient reports some mild abdominal pain but reports that the pain medications she is currently on are working very well for her. She does not have any other concerns at this time. ? ? ?O: ?BP 133/67 (BP Location: Left Arm)   Pulse 88   Temp 98.4 ?F (36.9 ?C) (Oral)   Resp 16   Ht 5\' 6"  (1.676 m)   Wt 74.8 kg   SpO2 97%   BMI 26.62 kg/m?   ?General: NAD, supine in bed  ?CV: RRR, no murmur appreciated ?Pulm: breathing comfortably on room air, speaking in full sentences, CTAB ?Abd: soft, non-distended, bowel sounds present ? ? ?A/P: ?Posterior perforated gastric ulcer 2/2 NSAID and steroid use ?Vitals remain stable, patient overall well-appearing. ?- GI following ?- NPO with bowel rest ?- Continuing antibiotics of cefepime an dflagyl ?- Pain control regimen: ? IV tylenol 1000mg  a6h ? IV dilaudid q4h PRN for severe pain ?- Continuing IVF due to NPO status ? ?Rise Patience, DO ?12/23/2021, 10:43 PM ?PGY-2, [place] Night Resident  ?Please page [phone] with questions.  ? ? ?

## 2021-12-24 DIAGNOSIS — I10 Essential (primary) hypertension: Secondary | ICD-10-CM

## 2021-12-24 DIAGNOSIS — L732 Hidradenitis suppurativa: Secondary | ICD-10-CM

## 2021-12-24 DIAGNOSIS — E785 Hyperlipidemia, unspecified: Secondary | ICD-10-CM

## 2021-12-24 DIAGNOSIS — G8929 Other chronic pain: Secondary | ICD-10-CM

## 2021-12-24 DIAGNOSIS — Z791 Long term (current) use of non-steroidal anti-inflammatories (NSAID): Secondary | ICD-10-CM

## 2021-12-24 DIAGNOSIS — E876 Hypokalemia: Secondary | ICD-10-CM

## 2021-12-24 DIAGNOSIS — M549 Dorsalgia, unspecified: Secondary | ICD-10-CM

## 2021-12-24 DIAGNOSIS — G4700 Insomnia, unspecified: Secondary | ICD-10-CM

## 2021-12-24 DIAGNOSIS — F411 Generalized anxiety disorder: Secondary | ICD-10-CM

## 2021-12-24 LAB — CBC
HCT: 38.7 % (ref 36.0–46.0)
Hemoglobin: 12.4 g/dL (ref 12.0–15.0)
MCH: 28.4 pg (ref 26.0–34.0)
MCHC: 32 g/dL (ref 30.0–36.0)
MCV: 88.8 fL (ref 80.0–100.0)
Platelets: 222 10*3/uL (ref 150–400)
RBC: 4.36 MIL/uL (ref 3.87–5.11)
RDW: 14.1 % (ref 11.5–15.5)
WBC: 10.7 10*3/uL — ABNORMAL HIGH (ref 4.0–10.5)
nRBC: 0 % (ref 0.0–0.2)

## 2021-12-24 LAB — BASIC METABOLIC PANEL
Anion gap: 11 (ref 5–15)
BUN: 6 mg/dL (ref 6–20)
CO2: 24 mmol/L (ref 22–32)
Calcium: 8.2 mg/dL — ABNORMAL LOW (ref 8.9–10.3)
Chloride: 101 mmol/L (ref 98–111)
Creatinine, Ser: 0.74 mg/dL (ref 0.44–1.00)
GFR, Estimated: >60 mL/min (ref >60)
Glucose, Bld: 87 mg/dL (ref 70–99)
Potassium: 3.1 mmol/L — ABNORMAL LOW (ref 3.5–5.1)
Sodium: 136 mmol/L (ref 135–145)

## 2021-12-24 LAB — MAGNESIUM: Magnesium: 1.9 mg/dL (ref 1.7–2.4)

## 2021-12-24 MED ORDER — ACETAMINOPHEN 10 MG/ML IV SOLN
500.0000 mg | Freq: Four times a day (QID) | INTRAVENOUS | Status: DC
  Filled 2021-12-24 (×2): qty 50

## 2021-12-24 MED ORDER — HYDROMORPHONE HCL 1 MG/ML IJ SOLN
0.5000 mg | Freq: Four times a day (QID) | INTRAMUSCULAR | Status: DC | PRN
  Administered 2021-12-24: 0.5 mg via INTRAVENOUS
  Filled 2021-12-24: qty 1

## 2021-12-24 MED ORDER — ACETAMINOPHEN 10 MG/ML IV SOLN
1000.0000 mg | Freq: Four times a day (QID) | INTRAVENOUS | Status: AC
  Administered 2021-12-24 (×2): 1000 mg via INTRAVENOUS
  Filled 2021-12-24 (×2): qty 100

## 2021-12-24 MED ORDER — POTASSIUM CHLORIDE 10 MEQ/100ML IV SOLN
10.0000 meq | INTRAVENOUS | Status: AC
  Administered 2021-12-24 (×4): 10 meq via INTRAVENOUS
  Filled 2021-12-24 (×4): qty 100

## 2021-12-24 MED ORDER — HYDROMORPHONE HCL 1 MG/ML IJ SOLN
0.5000 mg | Freq: Once | INTRAMUSCULAR | Status: AC
  Administered 2021-12-24: 0.5 mg via INTRAVENOUS
  Filled 2021-12-24: qty 1

## 2021-12-24 NOTE — Progress Notes (Addendum)
? ? ?   ?Subjective: ?CC: ?Pain improved. No n/v. Passing flatus. BM yesterday.  ? ?Objective: ?Vital signs in last 24 hours: ?Temp:  [98.4 ?F (36.9 ?C)-99.3 ?F (37.4 ?C)] 98.7 ?F (37.1 ?C) (05/14 0451) ?Pulse Rate:  [86-102] 90 (05/14 0451) ?Resp:  [16-18] 17 (05/14 0451) ?BP: (130-142)/(67-82) 130/82 (05/14 0451) ?SpO2:  [97 %-100 %] 99 % (05/14 0451) ?Last BM Date : 12/21/21 ? ?Intake/Output from previous day: ?05/13 0701 - 05/14 0700 ?In: 2667.1 [I.V.:1816; IV Piggyback:851.1] ?Out: -  ?Intake/Output this shift: ?No intake/output data recorded. ? ?PE: ?Gen:  Alert, NAD, pleasant ?Pulm: rate and effort normal ?Abd: Soft, ND, completely NT. +BS ?Psych: A&Ox3  ? ?Lab Results:  ?Recent Labs  ?  12/23/21 ?4709 12/24/21 ?6283  ?WBC 12.0* 10.7*  ?HGB 13.1 12.4  ?HCT 39.9 38.7  ?PLT 216 222  ? ?BMET ?Recent Labs  ?  12/23/21 ?0233 12/24/21 ?0307  ?NA 140 136  ?K 3.7 3.1*  ?CL 108 101  ?CO2 25 24  ?GLUCOSE 96 87  ?BUN 6 6  ?CREATININE 0.80 0.74  ?CALCIUM 8.2* 8.2*  ? ?PT/INR ?No results for input(s): LABPROT, INR in the last 72 hours. ?CMP  ?   ?Component Value Date/Time  ? NA 136 12/24/2021 0307  ? K 3.1 (L) 12/24/2021 6629  ? CL 101 12/24/2021 0307  ? CO2 24 12/24/2021 0307  ? GLUCOSE 87 12/24/2021 0307  ? BUN 6 12/24/2021 0307  ? CREATININE 0.74 12/24/2021 0307  ? CALCIUM 8.2 (L) 12/24/2021 0307  ? PROT 6.8 12/22/2021 1020  ? ALBUMIN 3.1 (L) 12/22/2021 1020  ? AST 12 (L) 12/22/2021 1020  ? ALT 13 12/22/2021 1020  ? ALKPHOS 68 12/22/2021 1020  ? BILITOT 0.6 12/22/2021 1020  ? GFRNONAA >60 12/24/2021 0307  ? GFRAA >60 10/19/2016 2000  ? ?Lipase  ?   ?Component Value Date/Time  ? LIPASE 26 12/22/2021 1020  ? ? ?Studies/Results: ?DG Chest 2 View ? ?Result Date: 12/22/2021 ?CLINICAL DATA:  Chest pain and shortness of breath with exertion 2 days. Back spasms and headache today. EXAM: CHEST - 2 VIEW COMPARISON:  08/28/2014 FINDINGS: Lungs are adequately inflated without focal airspace consolidation or effusion. Subtle  prominence of the perihilar markings. Cardiomediastinal silhouette and remainder of the exam is unchanged. IMPRESSION: Subtle prominence of the perihilar markings which may be due to minimal vascular congestion versus viral bronchitic process. Electronically Signed   By: Marin Olp M.D.   On: 12/22/2021 11:07  ? ?CT Angio Chest PE W and/or Wo Contrast ? ?Result Date: 12/22/2021 ?CLINICAL DATA:  Sharp central chest pain that radiates to the upper back. Shortness of breath and headache. Also with epigastric pain and nausea/vomiting. EXAM: CT ANGIOGRAPHY CHEST CT ABDOMEN AND PELVIS WITH CONTRAST TECHNIQUE: Multidetector CT imaging of the chest was performed using the standard protocol during bolus administration of intravenous contrast. Multiplanar CT image reconstructions and MIPs were obtained to evaluate the vascular anatomy. Multidetector CT imaging of the abdomen and pelvis was performed using the standard protocol during bolus administration of intravenous contrast. RADIATION DOSE REDUCTION: This exam was performed according to the departmental dose-optimization program which includes automated exposure control, adjustment of the mA and/or kV according to patient size and/or use of iterative reconstruction technique. CONTRAST:  115mL OMNIPAQUE IOHEXOL 350 MG/ML SOLN COMPARISON:  Abdomen/pelvis CT 03/21/2013 FINDINGS: CTA CHEST FINDINGS Cardiovascular: Heart size upper normal. No substantial pericardial effusion. No thoracic aortic aneurysm. No substantial atherosclerosis of the thoracic aorta. There is no  filling defect within the opacified pulmonary arteries to suggest the presence of an acute pulmonary embolus. Mediastinum/Nodes: No mediastinal lymphadenopathy. Upper normal lymph node identified in the right hilum at 10 mm short axis. No left hilar lymphadenopathy. The esophagus has normal imaging features. There is no axillary lymphadenopathy. Lungs/Pleura: 12 mm cavitary nodule identified right lower lobe  on image 82/4. No other suspicious pulmonary nodule or mass. Dependent atelectasis noted in the lower lungs. No overt pulmonary edema or substantial pleural effusion. Musculoskeletal: No worrisome lytic or sclerotic osseous abnormality. Review of the MIP images confirms the above findings. CT ABDOMEN and PELVIS FINDINGS Hepatobiliary: No suspicious focal abnormality within the liver parenchyma. There is no evidence for gallstones, gallbladder wall thickening, or pericholecystic fluid. No intrahepatic or extrahepatic biliary dilation. Pancreas: 3.3 x 2.7 x 2.6 cm complex multicystic lesion is identified along the cranial margin of the pancreatic body extending up adjacent to the stomach inferior to the esophagogastric junction (axial 22/5). Multiple adjacent enhancing lymph nodes are associated measuring in the 10-12 mm size range for short axis measurement (well seen coronal 43/8). Spleen: No splenomegaly. No focal mass lesion. Adrenals/Urinary Tract: No adrenal nodule or mass. Kidneys unremarkable. No evidence for hydroureter. Bladder is nondistended. Early excreted contrast noted in both ureters and the bladder, likely secondary to administration of trace contrast during loading of the power injector. Stomach/Bowel: Stomach is nondistended. Duodenum is normally positioned as is the ligament of Treitz. No small bowel wall thickening. No small bowel dilatation. The terminal ileum is normal. The appendix is normal. No gross colonic mass. No colonic wall thickening. Vascular/Lymphatic: No abdominal aortic aneurysm as noted above, there is mild clustered lymphadenopathy in the region of the gastrohepatic ligament. No pelvic sidewall lymphadenopathy. Reproductive: The uterus is unremarkable. 4.2 cm benign appearing right adnexal cyst. Left ovary unremarkable. Other: No intraperitoneal free fluid. Musculoskeletal: No worrisome lytic or sclerotic osseous abnormality. Review of the MIP images confirms the above findings.  IMPRESSION: 1. No CT evidence for acute pulmonary embolus. 2. 12 mm cavitary nodule identified right lower lobe. This may be infectious/inflammatory in etiology. Appearance of the nodule raises the question of septic embolus although no additional peripheral ill-defined nodules are seen in either lung. Given findings below, close follow-up will be required. 3. 3.3 x 2.7 x 2.6 cm complex multicystic lesion along the cranial margin of the pancreatic body extending up adjacent to the gastric fundus inferior to the esophagogastric junction. This is associated with a cluster of mildly enlarged lymph nodes in the same region. Imaging features may reflect an exophytic primary multicystic pancreatic process (pseudocyst or neoplasm). Cystic/necrotic lymph nodes from alternative source (lower esophagus/stomach) would also be a consideration. MRI abdomen with and without contrast might be able to add some additional insight, but upper endoscopy with endoscopic ultrasound would also be a consideration for further assessment 4. 4.2 cm benign appearing right adnexal cyst. No follow-up warranted. Note: This recommendation excludes cysts stable ? 2 years, cysts previously characterized by [place] or MR, corpus luteum cysts and adnexal calcifications without associated soft tissue mass. This recommendation does not apply to premenarchal patients and to those with increased risk (genetic, family history, elevated tumor markers or other high-risk factors) of ovarian cancer. Reference: JACR 2020 Feb; 17(2):248-254 right Electronically Signed   By: Misty Stanley M.D.   On: 12/22/2021 14:21  ? ?CT ABDOMEN PELVIS W CONTRAST ? ?Result Date: 12/22/2021 ?CLINICAL DATA:  Sharp central chest pain that radiates to the upper back. Shortness of breath  and headache. Also with epigastric pain and nausea/vomiting. EXAM: CT ANGIOGRAPHY CHEST CT ABDOMEN AND PELVIS WITH CONTRAST TECHNIQUE: Multidetector CT imaging of the chest was performed using the  standard protocol during bolus administration of intravenous contrast. Multiplanar CT image reconstructions and MIPs were obtained to evaluate the vascular anatomy. Multidetector CT imaging of the abdomen and pelvis

## 2021-12-25 ENCOUNTER — Inpatient Hospital Stay (HOSPITAL_COMMUNITY): Payer: Medicare Other

## 2021-12-25 DIAGNOSIS — K3189 Other diseases of stomach and duodenum: Secondary | ICD-10-CM | POA: Diagnosis present

## 2021-12-25 LAB — LIPASE, BLOOD: Lipase: 25 U/L (ref 11–51)

## 2021-12-25 LAB — BASIC METABOLIC PANEL
Anion gap: 14 (ref 5–15)
BUN: <5 mg/dL — ABNORMAL LOW (ref 6–20)
CO2: 19 mmol/L — ABNORMAL LOW (ref 22–32)
Calcium: 8.4 mg/dL — ABNORMAL LOW (ref 8.9–10.3)
Chloride: 102 mmol/L (ref 98–111)
Creatinine, Ser: 0.88 mg/dL (ref 0.44–1.00)
GFR, Estimated: >60 mL/min (ref >60)
Glucose, Bld: 76 mg/dL (ref 70–99)
Potassium: 3.6 mmol/L (ref 3.5–5.1)
Sodium: 135 mmol/L (ref 135–145)

## 2021-12-25 LAB — CBC
HCT: 38.6 % (ref 36.0–46.0)
Hemoglobin: 13.1 g/dL (ref 12.0–15.0)
MCH: 29.3 pg (ref 26.0–34.0)
MCHC: 33.9 g/dL (ref 30.0–36.0)
MCV: 86.4 fL (ref 80.0–100.0)
Platelets: 216 10*3/uL (ref 150–400)
RBC: 4.47 MIL/uL (ref 3.87–5.11)
RDW: 14.2 % (ref 11.5–15.5)
WBC: 15.4 10*3/uL — ABNORMAL HIGH (ref 4.0–10.5)
nRBC: 0 % (ref 0.0–0.2)

## 2021-12-25 MED ORDER — METRONIDAZOLE 500 MG/100ML IV SOLN: 500.0000 mg | Freq: Two times a day (BID) | INTRAVENOUS | Status: DC

## 2021-12-25 MED ORDER — HYDROMORPHONE HCL 1 MG/ML IJ SOLN
1.0000 mg | Freq: Four times a day (QID) | INTRAMUSCULAR | Status: DC
  Administered 2021-12-25 – 2021-12-26 (×6): 1 mg via INTRAVENOUS
  Filled 2021-12-25 (×6): qty 1

## 2021-12-25 MED ORDER — HEPARIN SODIUM (PORCINE) 5000 UNIT/ML IJ SOLN
5000.0000 [IU] | Freq: Three times a day (TID) | INTRAMUSCULAR | Status: DC
  Administered 2021-12-25 – 2021-12-28 (×9): 5000 [IU] via SUBCUTANEOUS
  Filled 2021-12-25 (×9): qty 1

## 2021-12-25 MED ORDER — IOHEXOL 300 MG/ML  SOLN
100.0000 mL | Freq: Once | INTRAMUSCULAR | Status: AC | PRN
Start: 2021-12-25 — End: 2021-12-25
  Administered 2021-12-25: 100 mL via INTRAVENOUS

## 2021-12-25 MED ORDER — SODIUM CHLORIDE 0.9 % IV SOLN: 2.0000 g | Freq: Three times a day (TID) | INTRAVENOUS | Status: DC

## 2021-12-25 MED ORDER — MEROPENEM 1 G IV SOLR
2.0000 g | Freq: Three times a day (TID) | INTRAVENOUS | Status: DC
  Administered 2021-12-25 – 2021-12-28 (×10): 2 g via INTRAVENOUS
  Filled 2021-12-25 (×15): qty 40

## 2021-12-25 MED ORDER — ACETAMINOPHEN 10 MG/ML IV SOLN
1000.0000 mg | Freq: Four times a day (QID) | INTRAVENOUS | Status: AC
  Administered 2021-12-25 – 2021-12-26 (×4): 1000 mg via INTRAVENOUS
  Filled 2021-12-25 (×5): qty 100

## 2021-12-25 MED ORDER — LACTATED RINGERS IV BOLUS
1000.0000 mL | Freq: Once | INTRAVENOUS | Status: AC
  Administered 2021-12-25: 1000 mL via INTRAVENOUS

## 2021-12-25 MED ORDER — HYDROMORPHONE HCL 1 MG/ML IJ SOLN
0.5000 mg | Freq: Four times a day (QID) | INTRAMUSCULAR | Status: DC | PRN
  Administered 2021-12-25 – 2021-12-26 (×2): 0.5 mg via INTRAVENOUS
  Filled 2021-12-25: qty 0.5
  Filled 2021-12-25: qty 1

## 2021-12-25 MED ORDER — IOHEXOL 300 MG/ML  SOLN
100.0000 mL | Freq: Once | INTRAMUSCULAR | Status: AC | PRN
  Administered 2021-12-25: 100 mL via ORAL
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
## 2021-12-25 NOTE — Progress Notes (Signed)
FPTS Brief Progress Note ? ?S: Patient grimacing in bed, not sleeping due to pain. ? ? ?O: ?BP (!) 154/81 (BP Location: Left Arm)   Pulse 94   Temp 100.2 ?F (37.9 ?C) (Oral)   Resp 18   Ht 5\' 6"  (1.676 m)   Wt 74.8 kg   SpO2 100%   BMI 26.62 kg/m?   ?Nursing note and vitals reviewed ?GEN: age-appropriate, AAW, grimacing in pain, NAD, WNWD ?Abdomen: Soft, exquisitely tender in epigastric region, ND. Voluntary guarding. Normoactive bowel sounds.  ?Neuro: AOx3  ?Ext: no edema ?Psych: Pleasant and appropriate  ? ?A/P: ?- Pain control: patient needed IV dilaudid 1 hr early last night around 10 PM. Severe abdominal pain (same as throughout entire admission) again at 1:45 AM. Increased hydromorphone to 1 mg IV q6h prn.  ?- Orders reviewed. Labs for AM ordered, which was adjusted as needed.  ?- See daily progress note for full plan details. ? ?Gladys Damme, MD ?12/25/2021, 1:19 AM ?PGY-3, [facility] Night Resident  ?Please page [phone] with questions.  ? ? ?

## 2021-12-25 NOTE — Progress Notes (Signed)
[facility] consult received stating Chauncey Reading, MD wanted 3 more IV's for this patient who has 2 working IV's in place. Reason was stated as "she is sick and we need them just in case". Reached out to physician via [place] and educated it is not best practice to place IV's "just in case", but that [facility] will be glad to assess her vasculature and place another IV if she NEEDS the access.  ?

## 2021-12-25 NOTE — Progress Notes (Signed)
Was just notified of [facility] Mercer Pod, RN's note from this morning regarding additional IV request. I wanted to clarify that neither I nor any member of the primary team received a communication (secure chat or page) with concerns about additional IV placement.  ? ?Of note, I am the physician who, in discussion with the charge nurse who was caring for this patient, verbally ordered placement of two large bore IV's in bilateral AC's. ? ?I ordered the large bore IV because this morning the patient had a working 22 G in left forearm and 20 G in right forearm. At the time I verbally ordered this, the patient's MEWs were red because of tachycardia and fever, she was developing worsening tachycardia and softer blood pressures, and had a worsening abdominal exam. The MEWS had been red since 0610. At that time, the STAT CT abd and UGI had not yet been performed and we were under the working diagnosis of leaking perforated gastric ulcer.  ? ?Acute bleeding from a perforated gastric ulcer can be life threatening and necessitate rapid fluid and blood administration. Therefore, given the developing clinical picture with the information I had at the time, this seemed reasonable.  ? ?If there are questions or concerns about this line of clinical reasoning, please page [phone] or [phone] and request to speak with Ezequiel Essex, MD ? ? ?Ezequiel Essex, MD ? ?

## 2021-12-25 NOTE — Progress Notes (Signed)
?   12/25/21 0610  ?Assess: MEWS Score  ?Temp (!) 103.1 ?F (39.5 ?C)  ?BP 128/74  ?Pulse Rate (!) 120  ?Resp 18  ?SpO2 99 %  ?O2 Device Room Air  ?Assess: MEWS Score  ?MEWS Temp 2  ?MEWS Systolic 0  ?MEWS Pulse 2  ?MEWS RR 0  ?MEWS LOC 0  ?MEWS Score 4  ?MEWS Score Color Red  ?Assess: if the MEWS score is Yellow or Red  ?Were vital signs taken at a resting state? Yes  ?Focused Assessment Change from prior assessment (see assessment flowsheet)  ?Early Detection of Sepsis Score *See Row Information* High  ?MEWS guidelines implemented *See Row Information* Yes  ?Treat  ?MEWS Interventions Other (Comment) ?(awaiting for tylenol IV from pharmacy)  ?Take Vital Signs  ?Increase Vital Sign Frequency  Red: Q 1hr X 4 then Q 4hr X 4, if remains red, continue Q 4hrs  ?Escalate  ?MEWS: Escalate Red: discuss with charge nurse/RN and provider, consider discussing with RRT  ?Notify: Charge Nurse/RN  ?Name of Charge Nurse/RN Notified [place]  ?Date Charge Nurse/RN Notified 12/25/21  ?Time Charge Nurse/RN Notified 0630  ?Notify: Provider  ?Provider Name/Title Mahoney,Caitlin,MD  ?Date Provider Notified 12/25/21  ?Time Provider Notified 0630  ?Method of Notification Face-to-face  ?Notification Reason Change in status ?(red MEWS,fever,tachycardia)  ?Provider response At bedside  ?Date of Provider Response 12/25/21  ?Time of Provider Response 0630  ? ? ?

## 2021-12-25 NOTE — Progress Notes (Signed)
Called to give report for transfer; no answer, will follow up  ?

## 2021-12-25 NOTE — Care Management Important Message (Signed)
Important Message ? ?Patient Details  ?Name: Crystal Mora ?MRN: [id number] ?Date of Birth: 02-03-1976 ? ? ?Medicare Important Message Given:  Yes ? ? ? ? ?Memory Argue ?12/25/2021, 2:20 PM ?

## 2021-12-25 NOTE — TOC Initial Note (Signed)
Transition of Care (TOC) - Initial/Assessment Note  ? ? ?Patient Details  ?Name: Crystal Mora ?MRN: [id number] ?Date of Birth: [birth year] ? ?Transition of Care (TOC) CM/SW Contact:    ?Tom-Johnson, Renea Ee, RN ?Phone Number: ?12/25/2021, 4:43 PM ? ?Clinical Narrative:                 ? ?CM spoke with patient abut needs for post hospital transition. Admitted for Epigastric pain.  ?From home with spouse, mother and 13 out of her 10 children. Currently unemployed, on disability. Independent with care and drive self prior to admission. Does not have DME's at home and states she does not need one.  ?PCP is at [place]. [facility] [address]  ?No OT f/u noted. Family to transport at discharge. CM will continue to follow with needs. ? ?Expected Discharge Plan: Home/Self Care ?Barriers to Discharge: Continued Medical Work up ? ? ?Patient Goals and CMS Choice ?Patient states their goals for this hospitalization and ongoing recovery are:: To return home ?CMS Medicare.gov Compare Post Acute Care list provided to:: Patient ?Choice offered to / list presented to : NA ? ?Expected Discharge Plan and Services ?Expected Discharge Plan: Home/Self Care ?  ?Discharge Planning Services: CM Consult ?Post Acute Care Choice: NA ?Living arrangements for the past 2 months: [place] ?                ?DME Arranged: N/A ?DME Agency: NA ?  ?  ?  ?HH Arranged: NA ?[place] Agency: NA ?  ?  ?  ? ?Prior Living Arrangements/Services ?Living arrangements for the past 2 months: [place] ?Lives with:: Spouse, Parents, Adult Children, Minor Children ?Patient language and need for interpreter reviewed:: Yes ?Do you feel safe going back to the place where you live?: Yes      ?Need for Family Participation in Patient Care: Yes (Comment) ?Care giver support system in place?: Yes (comment) ?  ?Criminal Activity/Legal Involvement Pertinent to Current Situation/Hospitalization: No - Comment  as needed ? ?Activities of Daily Living ?Home Assistive Devices/Equipment: None ?ADL Screening (condition at time of admission) ?Patient's cognitive ability adequate to safely complete daily activities?: Yes ?Is the patient deaf or have difficulty hearing?: No ?Does the patient have difficulty seeing, even when wearing glasses/contacts?: No ?Does the patient have difficulty concentrating, remembering, or making decisions?: No ?Patient able to express need for assistance with ADLs?: Yes ?Does the patient have difficulty dressing or bathing?: No ?Independently performs ADLs?: Yes (appropriate for developmental age) ?Does the patient have difficulty walking or climbing stairs?: No ?Weakness of Legs: Both ?Weakness of Arms/Hands: Both ? ?Permission Sought/Granted ?Permission sought to share information with : Case Manager, Family Supports ?Permission granted to share information with : Yes, Verbal Permission Granted ?   ?   ?   ?   ? ?Emotional Assessment ?Appearance:: Appears stated age ?Attitude/Demeanor/Rapport: Engaged, Gracious ?Affect (typically observed): Accepting, Appropriate, Calm, Hopeful ?Orientation: : Oriented to Self, Oriented to Place, Oriented to  Time, Oriented to Situation ?Alcohol / Substance Use: Not Applicable ?Psych Involvement: No (comment) ? ?Admission diagnosis:  Pancreatic cyst [K86.2] ?Epigastric pain [R10.13] ?Lung nodule [R91.1] ?Nonspecific chest pain [R07.9] ?Acute gastric perforation [K31.89] ?Patient Active Problem List  ? Diagnosis Date Noted  ? Acute gastric perforation 12/25/2021  ? Epigastric pain 12/22/2021  ? Peroneal tendinitis 12/06/2021  ? Chronic buttock pain 07/26/2021  ? Arthropathy of lumbar facet joint 10/10/2020  ? Bipolar 2 disorder,  major depressive episode ([place]) 03/30/2020  ? Other bipolar disorder ([place]) 03/30/2020  ? GAD (generalized anxiety disorder) 03/30/2020  ? Hearing loss 04/30/2019  ? IFG (impaired fasting glucose) 04/30/2019  ? Overweight with body mass index  (BMI) of 28 to 28.9 in adult 01/28/2019  ? Cervical radiculopathy 01/06/2019  ? Depression 10/30/2018  ? Hypertension 10/30/2018  ? Mixed hyperlipidemia 10/30/2018  ? History of thyroidectomy, subtotal 09/10/2018  ? Lumbar degenerative disc disease 09/05/2018  ? Chronic migraine w/o aura w/o status migrainosus, not intractable 06/19/2018  ? Chronic back pain 05/30/2018  ? Menorrhagia 11/25/2017  ? Iron deficiency 10/03/2017  ? Pulsatile tinnitus of left ear 07/18/2017  ? Sensorineural hearing loss (SNHL), bilateral 07/18/2017  ? Cesarean deliv due to previous difficult deliv, deliv, curr hospitaliz 01/02/2017  ? Chronic narcotic use 06/29/2016  ? History of eclampsia 06/29/2016  ? History of reversal of tubal ligation 03/28/2016  ? Anemia 01/12/2016  ? GERD (gastroesophageal reflux disease) 11/10/2015  ? Analgesic rebound headache 07/25/2015  ? Chronic idiopathic urticaria 07/25/2015  ? Hidradenitis suppurativa 07/25/2015  ? Hyperopia of both eyes with astigmatism and presbyopia 07/25/2015  ? Meibomian gland dysfunction (MGD) of upper and lower lids of both eyes 07/25/2015  ? Psychiatric disorder 07/25/2015  ? ?PCP:  [facility] ?Pharmacy:   ?[id number] [address] ?[address] ?[place] [address] ?Phone: [phone] Fax: [phone] ? ? ? ? ?Social Determinants of Health (SDOH) Interventions ?  ? ?Readmission Risk Interventions ?   ? View : No data to display.  ?  ?  ?  ? ? ? ?

## 2021-12-25 NOTE — Progress Notes (Signed)
? ? ?   ?Subjective: ?CC: ?Patient states her pain worsened some overnight.  T max 103 with some tachycardia.  No nausea or vomiting, actually wanting to eat this morning when I saw her. ? ?Objective: ?Vital signs in last 24 hours: ?Temp:  [99 ?F (37.2 ?C)-103.2 ?F (39.6 ?C)] 99 ?F (37.2 ?C) (05/15 1008) ?Pulse Rate:  [94-120] 104 (05/15 1008) ?Resp:  [16-18] 18 (05/15 1008) ?BP: (99-154)/(58-81) 106/62 (05/15 1008) ?SpO2:  [97 %-100 %] 100 % (05/15 1008) ?Last BM Date : 12/21/21 ? ?Intake/Output from previous day: ?05/14 0701 - 05/15 0700 ?In: 5103 [P.O.:1800; I.V.:2502.6; IV Piggyback:800.4] ?Out: -  ?Intake/Output this shift: ?No intake/output data recorded. ? ?PE: ?Gen:  Alert, NAD, pleasant ?Pulm: rate and effort normal ?Abd: Soft, ND, mild epigastric tenderness, no guarding or rebounding. +BS ?Psych: A&Ox3  ? ?Lab Results:  ?Recent Labs  ?  12/24/21 ?0307 12/25/21 ?0330  ?WBC 10.7* 15.4*  ?HGB 12.4 13.1  ?HCT 38.7 38.6  ?PLT 222 216  ? ?BMET ?Recent Labs  ?  12/24/21 ?0307 12/25/21 ?0330  ?NA 136 135  ?K 3.1* 3.6  ?CL 101 102  ?CO2 24 19*  ?GLUCOSE 87 76  ?BUN 6 <5*  ?CREATININE 0.74 0.88  ?CALCIUM 8.2* 8.4*  ? ?PT/INR ?No results for input(s): LABPROT, INR in the last 72 hours. ?CMP  ?   ?Component Value Date/Time  ? NA 135 12/25/2021 0330  ? K 3.6 12/25/2021 0330  ? CL 102 12/25/2021 0330  ? CO2 19 (L) 12/25/2021 0330  ? GLUCOSE 76 12/25/2021 0330  ? BUN <5 (L) 12/25/2021 0330  ? CREATININE 0.88 12/25/2021 0330  ? CALCIUM 8.4 (L) 12/25/2021 0330  ? PROT 6.8 12/22/2021 1020  ? ALBUMIN 3.1 (L) 12/22/2021 1020  ? AST 12 (L) 12/22/2021 1020  ? ALT 13 12/22/2021 1020  ? ALKPHOS 68 12/22/2021 1020  ? BILITOT 0.6 12/22/2021 1020  ? GFRNONAA >60 12/25/2021 0330  ? GFRAA >60 10/19/2016 2000  ? ?Lipase  ?   ?Component Value Date/Time  ? LIPASE 25 12/25/2021 0330  ? ? ?Studies/Results: ?CT ABDOMEN PELVIS W CONTRAST ? ?Result Date: 12/25/2021 ?CLINICAL DATA:  Posterior gastric perforation with overlying complex  pancreatic lesion, now with fever and leukocytosis EXAM: CT ABDOMEN AND PELVIS WITH CONTRAST TECHNIQUE: Multidetector CT imaging of the abdomen and pelvis was performed using the standard protocol following bolus administration of intravenous contrast. RADIATION DOSE REDUCTION: This exam was performed according to the departmental dose-optimization program which includes automated exposure control, adjustment of the mA and/or kV according to patient size and/or use of iterative reconstruction technique. CONTRAST:  15mL OMNIPAQUE IOHEXOL 300 MG/ML SOLN IV. No oral contrast. COMPARISON:  12/22/2021 FINDINGS: Lower chest: Dependent atelectasis posterior lower lobes Hepatobiliary: Tiny nonspecific low-attenuation focus liver image 22 unchanged. No additional hepatic abnormalities or biliary dilatation. Minimal edema adjacent to gallbladder and generally in upper abdomen. Pancreas: Peripancreatic edema at proximal tail. Somewhat ill-defined low-attenuation focus within pancreatic tail proximally, 11 x 10 x 12 mm, question pseudocyst, now unilocular and decreased in size since previous exam. Remainder of pancreas normal. No calcifications or ductal dilatation. No additional mass. Spleen: Normal appearance.  Small adjacent splenule. Adrenals/Urinary Tract: Adrenal glands, kidneys, ureters, and bladder normal appearance Stomach/Bowel: Thickening of wall of gastric antrum especially posteriorly, extending into pylorus. Adjacent infiltration of lesser sac fat planes. Proximal stomach decompressed and otherwise unremarkable. Large and small bowel loops normal appearance. Normal appendix. Vascular/Lymphatic: Few scattered pelvic phleboliths. Vascular structures patent. Minimal atherosclerotic  calcification aorta. Few scattered normal sized iliac axis and periportal lymph nodes as well as few LEFT para-aortic nodes, nonenlarged. Reproductive: Uterus and LEFT ovary normal appearance. Cyst identified in RIGHT ovary 4.2 x 2.9 cm;  this is a simple appearing cyst and follow-up ultrasound in 3-6 months is recommended. Other: No free air or hernia.  Trace free fluid in cul-de-sac. Musculoskeletal: Osseous structures unremarkable. IMPRESSION: Peripancreatic edema at proximal tail of pancreas consistent with acute pancreatitis. Somewhat ill-defined low-attenuation focus within proximal pancreatic tail , 11 x 10 x 12 mm, question pseudocyst, decreased in size since previous exam. Thickening of wall of gastric antrum especially posteriorly, extending into pylorus, could be related favor gastritis versus ulcer disease. 4.2 cm diameter RIGHT ovarian cyst, simple appearing by CT; follow-up ultrasound recommended in 3-6 months. Aortic Atherosclerosis (ICD10-I70.0). Electronically Signed   By: Lavonia Dana M.D.   On: 12/25/2021 08:36  ? ?DG CHEST PORT 1 VIEW ? ?Result Date: 12/25/2021 ?CLINICAL DATA:  Fever and leukocytosis. EXAM: PORTABLE CHEST 1 VIEW COMPARISON:  CT chest and chest x-ray dated Dec 22, 2021. FINDINGS: The heart size and mediastinal contours are within normal limits. No focal consolidation, pleural effusion, or pneumothorax. No acute osseous abnormality. IMPRESSION: No active disease. Electronically Signed   By: Titus Dubin M.D.   On: 12/25/2021 09:57   ? ?Anti-infectives: ?Anti-infectives (From admission, onward)  ? ? Start     Dose/Rate Route Frequency Ordered Stop  ? 12/25/21 1600  metroNIDAZOLE (FLAGYL) IVPB 500 mg  Status:  Discontinued       ? 500 mg ?100 mL/hr over 60 Minutes Intravenous Every 12 hours 12/25/21 0748 12/25/21 0842  ? 12/25/21 1400  ceFEPIme (MAXIPIME) 2 g in sodium chloride 0.9 % 100 mL IVPB  Status:  Discontinued       ? 2 g ?200 mL/hr over 30 Minutes Intravenous Every 8 hours 12/25/21 0748 12/25/21 0841  ? 12/25/21 0930  meropenem (MERREM) 2 g in sodium chloride 0.9 % 100 mL IVPB       ? 2 g ?280 mL/hr over 30 Minutes Intravenous Every 8 hours 12/25/21 0842    ? 12/22/21 2300  ceFEPIme (MAXIPIME) 2 g in sodium  chloride 0.9 % 100 mL IVPB  Status:  Discontinued       ? 2 g ?200 mL/hr over 30 Minutes Intravenous Every 8 hours 12/22/21 1440 12/25/21 0720  ? 12/22/21 1445  ceFEPIme (MAXIPIME) 2 g in sodium chloride 0.9 % 100 mL IVPB       ? 2 g ?200 mL/hr over 30 Minutes Intravenous  Once 12/22/21 1436 12/22/21 1745  ? 12/22/21 1445  metroNIDAZOLE (FLAGYL) IVPB 500 mg  Status:  Discontinued       ? 500 mg ?100 mL/hr over 60 Minutes Intravenous Every 12 hours 12/22/21 1436 12/25/21 0721  ? ?  ? ? ? ?Assessment/Plan ?Posterior perforated gastric ulcer ?- CT w/ 3.3 x 2.7 x 2.6 cm complex multicystic lesion along the cranial ?margin of the pancreatic body extending up adjacent to the gastric fundus inferior to the esophagogastric junction. GI and our team has reviewed and felt 2/2 posterior perforated gastric ulcer. No current indication for surgical intervention.  ?- High risk w/ longstanding hx of NSAID use (50 BC powders per week) and has been on prednisone for the past 6 to 8 weeks ?- H. Pylori ordered (asked RN to send), still not sent ?- PPI gtt, IV abx ?- Cont bowel rest. UGI today as repeat CT from  this morning with no significant changes to explain change in pain and fever. ?- usually intra-abdominal sources do not cause fevers up to 103.  Blood cultures are pending. ?-WBC is up today to 15K, other labs unremarkable ?  ?FEN - NPO, IVF per primary ?VTE - SCDs, subq heparin ?ID - Cefepime/Flagyl, changed to merrem 5/15 ?  ?- Per primary -  ?DM ?HTN ?HLD ?12 mm cavitary nodule identified right lower lobe ?R adnexal cyst ? ?I have reviewed 24 hours of vitals, labs, imaging (personally and independently reviewed and interpreted CT Scan of abdomen/pelvis), as well as nursing and medicine notes. ? ? LOS: 3 days  ? ? ?Henreitta Cea , PA-C ?[facility] ?12/25/2021, 11:20 AM ?Please see [place] for pager number during day hours 7:00am-4:30pm ? ?

## 2021-12-25 NOTE — Progress Notes (Signed)
Mobility Specialist: Progress Note ? ? 12/25/21 1550  ?Mobility  ?Activity Ambulated independently in hallway  ?Level of Assistance Independent  ?Assistive Device Other (Comment) ?(IV pole)  ?Distance Ambulated (ft) 1000 ft  ?Activity Response Tolerated well  ?$Mobility charge 1 Mobility  ? ?Pt received in BR and agreeable to hallway ambulation when finished. C/o abdominal pain and mild SOB during session. Pt back to bed after session with call bell and phone in reach.  ? ?Crystal Mora ?Mobility Specialist ?Mobility Specialist [place]: [phone] ?Mobility Specialist [place]: [phone] ? ?

## 2021-12-25 NOTE — Progress Notes (Signed)
FPTS Interim Progress Note ? ?S:Saw patient at bedside to reassess abdomen. Patient rates it a 10/10 but that medication helps it ? ?O: ?BP 140/79 (BP Location: Left Arm)   Pulse 94   Temp (!) 101.9 ?F (38.8 ?C)   Resp 18   Ht 5\' 6"  (1.676 m)   Wt 74.8 kg   SpO2 97%   BMI 26.62 kg/m?   ?Gen: NAD, pleasant, laughing, alert and responsive to all questions ?Abd: Tender to light palpation on RUQ and epigastric region, no rebound tenderness, no gaurding, soft, nondistended ? ?A/P: ?-F/u UGI series ?-Continue pain control ?-Febrile to 101.9 just started meropenem, monitor fever curve ? ?Plan per progress note ? ?Gerrit Heck, MD ?12/25/2021, 3:13 PM ?PGY-1, [employer] ?Service pager [phone]  ?

## 2021-12-25 NOTE — Progress Notes (Addendum)
VAST RN to patient's bedside to assess vasculature. Patient out of room. Unit RN notified to contact VAST once patient has returned.  ?

## 2021-12-25 NOTE — Progress Notes (Signed)
Pharmacy Antibiotic Note ? ?Crystal Mora is a 46 y.o. female admitted on 12/22/2021 with  perforated gastric ulcer and concern for intra-abdominal infection .  Patient is on day # 4 of Cefepime and Flagyl with increased WBC and new fever.  Pharmacy has been consulted to change antibiotics to Meropenem. ? ?Renal function is stable with SCr ~ 0.8 ? ?Plan: ?Meropenem 2gm IV q8h ?Follow-up cx data and clinical progress. ? ?Height: 5\' 6"  (167.6 cm) ?Weight: 74.8 kg (164 lb 14.5 oz) ?IBW/kg (Calculated) : 59.3 ? ?Temp (24hrs), Avg:101.7 ?F (38.7 ?C), Min:98.9 ?F (37.2 ?C), Max:103.2 ?F (39.6 ?C) ? ?Recent Labs  ?Lab 12/22/21 ?1020 12/22/21 ?1525 12/22/21 ?2301 12/23/21 ?3578 12/24/21 ?9784 12/25/21 ?0330  ?WBC 16.4*  --   --  12.0* 10.7* 15.4*  ?CREATININE 0.84  --   --  0.80 0.74 0.88  ?LATICACIDVEN  --  0.7 0.5  --   --   --   ?  ?Estimated Creatinine Clearance: 84.4 mL/min (by C-G formula based on SCr of 0.88 mg/dL).   ? ?Allergies  ?Allergen Reactions  ? Biaxin [Clarithromycin]   ? Morphine And Related Hives  ?  Says it is okay with benadryl administration  ? Penicillins Hives, Itching and Swelling  ?  Has patient had a PCN reaction causing immediate rash, facial/tongue/throat swelling, SOB or lightheadedness with hypotension: No ?Has patient had a PCN reaction causing severe rash involving mucus membranes or skin necrosis: No ?Has patient had a PCN reaction that required hospitalization No ?Has patient had a PCN reaction occurring within the last 10 years: Yes ?If all of the above answers are "NO", then may proceed with Cephalosporin use. ?  ? Percocet [Oxycodone-Acetaminophen] Hives and Itching  ? Shrimp [Shellfish Allergy]   ? Citalopram Anxiety  ?  aggression  ? Topiramate Rash  ? ? ?Antimicrobials this admission: ?Cefepime 5/12 >> 5/15 ?Flagyl 5/12 >> 5/15 ?Merrem 5/15>> ? ?Dose adjustments this admission: ? ? ?Microbiology results: ?5/12 BCx: ngtd ? ? ?Thank you for allowing pharmacy to be a part of this  patient?s care. ? ?[employer], Pharm.D., BCPS ?Clinical Pharmacist ? ?**Pharmacist phone directory can be found on [email or web address] listed under [place]. ? ?12/25/2021 8:46 AM ? ? ?

## 2021-12-25 NOTE — Progress Notes (Signed)
Reviewed MEWS and noticed patient recently changed to red with fever of 103.1*F, HR 120. Continued abdominal pain. Currently on flagyl, and cefepime. Last dose of IV tylenol last night. Patient has posterior gastric perforation due to potential cyst on pancreas.  ? ?Nursing note and vitals reviewed ?GEN: age appropriate, AAW, in pain, NAD ?Cardiac: tachycardic rate and regular rhythm. Normal S1/S2. No murmurs, rubs, or gallops appreciated. 2+ radial pulses. ?Lungs: Clear bilaterally to ascultation. No increased WOB, no accessory muscle usage. No w/r/r. ?Abdomen: Exquisite tenderness in epigastric region, general tenderness, voluntary guarding. Normoactive bowel sounds.    ?Neuro: AOx3  ?Ext: no edema ?Psych: Pleasant and appropriate  ? ?A/P ?Concern for intraabdominal infection given fever, increased white count, tachycardia ?- IV tylenol ?- d/c cefepime ?- expand to zosyn ?- blood and urine cultures ?- abdominal imaging ?- EKG ?-serial exams this AM. Will reach out to surgery team. ? ?Gladys Damme, MD ?[facility], PGY-3 ? ?

## 2021-12-26 ENCOUNTER — Inpatient Hospital Stay: Payer: Self-pay

## 2021-12-26 DIAGNOSIS — R1030 Lower abdominal pain, unspecified: Secondary | ICD-10-CM

## 2021-12-26 DIAGNOSIS — K259 Gastric ulcer, unspecified as acute or chronic, without hemorrhage or perforation: Secondary | ICD-10-CM

## 2021-12-26 DIAGNOSIS — D72829 Elevated white blood cell count, unspecified: Secondary | ICD-10-CM

## 2021-12-26 DIAGNOSIS — K862 Cyst of pancreas: Principal | ICD-10-CM

## 2021-12-26 LAB — COMPREHENSIVE METABOLIC PANEL
ALT: 16 U/L (ref 0–44)
AST: 27 U/L (ref 15–41)
Albumin: 2.2 g/dL — ABNORMAL LOW (ref 3.5–5.0)
Alkaline Phosphatase: 78 U/L (ref 38–126)
Anion gap: 7 (ref 5–15)
BUN: <5 mg/dL — ABNORMAL LOW (ref 6–20)
CO2: 23 mmol/L (ref 22–32)
Calcium: 8.2 mg/dL — ABNORMAL LOW (ref 8.9–10.3)
Chloride: 103 mmol/L (ref 98–111)
Creatinine, Ser: 0.8 mg/dL (ref 0.44–1.00)
GFR, Estimated: >60 mL/min (ref >60)
Glucose, Bld: 187 mg/dL — ABNORMAL HIGH (ref 70–99)
Potassium: 3.2 mmol/L — ABNORMAL LOW (ref 3.5–5.1)
Sodium: 133 mmol/L — ABNORMAL LOW (ref 135–145)
Total Bilirubin: 1 mg/dL (ref 0.3–1.2)
Total Protein: 6 g/dL — ABNORMAL LOW (ref 6.5–8.1)

## 2021-12-26 LAB — LIPASE, BLOOD: Lipase: 24 U/L (ref 11–51)

## 2021-12-26 LAB — URINE CULTURE: Culture: NO GROWTH

## 2021-12-26 LAB — CBC
HCT: 37.5 % (ref 36.0–46.0)
Hemoglobin: 13.2 g/dL (ref 12.0–15.0)
MCH: 29.7 pg (ref 26.0–34.0)
MCHC: 35.2 g/dL (ref 30.0–36.0)
MCV: 84.5 fL (ref 80.0–100.0)
Platelets: 209 10*3/uL (ref 150–400)
RBC: 4.44 MIL/uL (ref 3.87–5.11)
RDW: 14.3 % (ref 11.5–15.5)
WBC: 17.1 10*3/uL — ABNORMAL HIGH (ref 4.0–10.5)
nRBC: 0 % (ref 0.0–0.2)

## 2021-12-26 LAB — H. PYLORI ANTIBODY, IGG: H Pylori IgG: 0.11 Index Value (ref 0.00–0.79)

## 2021-12-26 MED ORDER — SUCRALFATE 1 GM/10ML PO SUSP
1.0000 g | Freq: Three times a day (TID) | ORAL | Status: DC
  Administered 2021-12-26 – 2021-12-28 (×7): 1 g via ORAL
  Filled 2021-12-26 (×8): qty 10

## 2021-12-26 MED ORDER — ACETAMINOPHEN 10 MG/ML IV SOLN
1000.0000 mg | Freq: Four times a day (QID) | INTRAVENOUS | Status: AC
  Administered 2021-12-26 (×3): 1000 mg via INTRAVENOUS
  Filled 2021-12-26 (×4): qty 100

## 2021-12-26 MED ORDER — POTASSIUM CHLORIDE 10 MEQ/100ML IV SOLN
10.0000 meq | INTRAVENOUS | Status: AC
  Administered 2021-12-26 (×4): 10 meq via INTRAVENOUS
  Filled 2021-12-26 (×4): qty 100

## 2021-12-26 MED ORDER — HYDROMORPHONE HCL 1 MG/ML IJ SOLN
0.5000 mg | Freq: Four times a day (QID) | INTRAMUSCULAR | Status: DC
  Administered 2021-12-26 – 2021-12-28 (×8): 0.5 mg via INTRAVENOUS
  Filled 2021-12-26 (×9): qty 0.5

## 2021-12-26 MED ORDER — ESTRADIOL 0.05 MG/24HR TD PTWK
0.0500 mg | MEDICATED_PATCH | TRANSDERMAL | Status: DC
  Administered 2021-12-26: 0.05 mg via TRANSDERMAL
  Filled 2021-12-26: qty 1

## 2021-12-26 NOTE — Progress Notes (Addendum)
? ? Attending physician's note  ? ?I have taken a history, reviewed the chart, and examined the patient. I performed a substantive portion of this encounter, including complete performance of at least one of the key components, in conjunction with the APP. I agree with the APP's note, impression, and recommendations with my edits.  ? ?GI service reconsulted on this patient today for ongoing abdominal pain and CT with question of acute pancreatitis of the tail of pancreas. ? ?Exam with TTP in RUQ, but no appreciable tenderness in MEG or left hemiabdomen.  No Grey Turner's or Cullen sign.  Otherwise no hemoconcentration, normal BUN/creatinine, normal lipase, liver enzymes, T. bili. ? ?- CT (5/15): Peripancreatic edema around the tail of the pancreas, 11 x 10 x 12 mm ill-defined focus in pancreatic tail, decreased in size from previous (possibly pseudocyst).  Thickened gastric antrum ?- UGI series (5/15): Unremarkable appearing stomach and duodenum.  No extravasation ? ?Additionally, continues to have undulating fevers, with Tmax 103.  Currently on cefepime (day 3) and meropenem (day 2).  Cultures negative to date. ? ?1) RUQ pain ?2) Suspected PUD ?3) Antral wall thickening on CT ?4) Radiographic evidence of pancreatitis ?- While she does have radiographic evidence of pancreatitis on CT, lipase normal, and otherwise BISAP score 1 (SIRS criteria from fever and leukocytosis). ?- Okay with clear liquid for the time being ?- Continue IV fluids ?- Continue pain management and antiemetics ?- Continue high-dose PPI ?- Agree with starting Carafate ?- I discussed the risk/benefits of EGD with the patient at length today.  Initial CT was suspicious for posterior penetrating gastric ulcer with contained perforation.  Thankfully, subsequent CT seems improved with UGI series confirming no extravasation.  Nonetheless, a thin-walled ulcer still poses a risk of perforation with  endoscopy and air insufflation.  Plan for 24 hours more of optimal medical management.  If ongoing symptoms, can consider EGD on 12/28/2021 at the earliest ?- Trend liver enzymes, CBC, BMP daily ? ?5) Pancreatic cyst ?- Eventually plan for outpatient MRI pancreas protocol with subsequent EUS prn findings ? ?6) Fever ?- While pancreatitis can certainly cause fever, the degree of inflammation on CT and normal lipase seems inconsistent. ?- Discussed with primary Hospitalist service.  Agree with plan for ID consultation ?- ABX per primary service ? ?GI service will continue to follow ? ?[address] DO, FACG ?[phone] office  ? ?   ? ?        ? ?Daily Rounding Note ? ?12/26/2021, 1:49 PM ? LOS: 4 days  ? ?SUBJECTIVE:   ?Chief complaint:   Abdominal pain. ? ?Teaching service requesting [place] to re-see this pt.  Seen by Dr. Ardis Hughs on 5/12 for epigastric, lower chest pain.  Taking large doses of BC powder for years and prednisone for 6 to 8 weeks.  CTAP # 1 showed complex, multicystic, 3.3 x 2.7 x 2.6 cm lesion in the pancreatic body extending to the gastric fundus and then to the EG junction.  Associated regional, enlarged lymph nodes.  Question whether this represented exophytic primary multicystic pancreatic process such as pseudocyst or neoplasm.  Another consideration was cystic/necrotic lymph nodes from lower esophagus or stomach.  MRI recommended to add clinical insight.  Upper EUS would also be a consideration.  There was also a pulmonary cavitary lesion. ?Dr. Ardis Hughs concerned for contained perforated gastric ulcer and felt that this was unusual presentation of pancreatic cystic disease, and no indication she had pancreatitis. ?Pt initiated on broad-spectrum antibiotics and  Protonix IV bid, then drip, plan now is to restart the bid IV Protonix. ? ?Surgery consulted and favored medical management of contained perforated posterior gastric ulcer. Fup Upper GI series yesterday: unremarkable study with no evidence  for gastric perforation, this was considered a limited study in regards to ulcers.  ?CTAP # 2 yesterday: Peripancreatic edema at proximal tail of pancreas consistent with acute pancreatitis.  11 x 10 x 12 mm ill-defined focus at proximal pancreatic tail, decreased in size from previous and possibly a pseudocyst.  Thickening of gastric antrum especially posteriorly extending to pylorus, favoring gastritis vs ulcer disease. ?Sodium is 133.  Potassium 3.2.  Other than low albumin and low total protein, LFTs are normal. ?WBCs unfortunately are rising.  They started off at 16.4..  Dropped to 10.7 on hospital day 3..  Now back up to 17.1 hospital day 5 ? ?A couple of days ago she had a temperature of 103.1 Fahrenheit.  Early this morning she spiked a temperature of 102.9.  Meropenem day 2, previously treated with 3 days of cefepime and metronidazole. ?So far repeat blood cultures dating back to admission unrevealing.   ? ?OBJECTIVE:        ? Vital signs in last 24 hours:    ?Temp:  [99.8 ?F (37.7 ?C)-103 ?F (39.4 ?C)] 99.8 ?F (37.7 ?C) (05/16 1129) ?Pulse Rate:  [94-129] 100 (05/16 1129) ?Resp:  [16-28] 17 (05/16 1129) ?BP: (107-167)/(68-93) 107/68 (05/16 1129) ?SpO2:  [91 %-99 %] 93 % (05/16 1129) ?Last BM Date : 12/21/21 ?Filed Weights  ? 12/22/21 1014  ?Weight: 74.8 kg  ? ?General: Looks well.  Currently comfortable.  Alert and conversant. ?Heart: RRR. ?Chest: Clear bilaterally.  No labored breathing or cough ?Abdomen: Soft.  Bowel sounds normal quality, hypoactive..  No distention.  She is tender bilaterally/diffusely but more tender on the right with some voluntary guarding.  No rebound. ?Extremities: No CCE. ?Neuro/Psych: Alert.  Appropriate.  Oriented x3. ? ?Intake/Output from previous day: ?05/15 0701 - 05/16 0700 ?In: 4517.1 [P.O.:200; I.V.:3490.6; IV Piggyback:826.5] ?Out: -  ? ?Intake/Output this shift: ?No intake/output data recorded. ? ?Lab Results: ?Recent Labs  ?  12/24/21 ?0307 12/25/21 ?0330  12/26/21 ?2706  ?WBC 10.7* 15.4* 17.1*  ?HGB 12.4 13.1 13.2  ?HCT 38.7 38.6 37.5  ?PLT 222 216 209  ? ?BMET ?Recent Labs  ?  12/24/21 ?0307 12/25/21 ?0330 12/26/21 ?2376  ?NA 136 135 133*  ?K 3.1* 3.6 3.2*  ?CL 101 102 103  ?CO2 24 19* 23  ?GLUCOSE 87 76 187*  ?BUN 6 <5* <5*  ?CREATININE 0.74 0.88 0.80  ?CALCIUM 8.2* 8.4* 8.2*  ? ?LFT ?Recent Labs  ?  12/26/21 ?2831  ?PROT 6.0*  ?ALBUMIN 2.2*  ?AST 27  ?ALT 16  ?ALKPHOS 78  ?BILITOT 1.0  ? ?PT/INR ?No results for input(s): LABPROT, INR in the last 72 hours. ?Hepatitis Panel ?No results for input(s): HEPBSAG, HCVAB, HEPAIGM, HEPBIGM in the last 72 hours. ? ?Studies/Results: ?CT ABDOMEN PELVIS W CONTRAST ? ?Result Date: 12/25/2021 ?CLINICAL DATA:  Posterior gastric perforation with overlying complex pancreatic lesion, now with fever and leukocytosis EXAM: CT ABDOMEN AND PELVIS WITH CONTRAST TECHNIQUE: Multidetector CT imaging of the abdomen and pelvis was performed using the standard protocol following bolus administration of intravenous contrast. RADIATION DOSE REDUCTION: This exam was performed according to the departmental dose-optimization program which includes automated exposure control, adjustment of the mA and/or kV according to patient size and/or use of iterative reconstruction technique. CONTRAST:  17mL  OMNIPAQUE IOHEXOL 300 MG/ML SOLN IV. No oral contrast. COMPARISON:  12/22/2021 FINDINGS: Lower chest: Dependent atelectasis posterior lower lobes Hepatobiliary: Tiny nonspecific low-attenuation focus liver image 22 unchanged. No additional hepatic abnormalities or biliary dilatation. Minimal edema adjacent to gallbladder and generally in upper abdomen. Pancreas: Peripancreatic edema at proximal tail. Somewhat ill-defined low-attenuation focus within pancreatic tail proximally, 11 x 10 x 12 mm, question pseudocyst, now unilocular and decreased in size since previous exam. Remainder of pancreas normal. No calcifications or ductal dilatation. No additional  mass. Spleen: Normal appearance.  Small adjacent splenule. Adrenals/Urinary Tract: Adrenal glands, kidneys, ureters, and bladder normal appearance Stomach/Bowel: Thickening of wall of gastric antrum especially posteriorly

## 2021-12-26 NOTE — Progress Notes (Signed)
? ? ?   ?Subjective: ?CC: ?Sleeping, would occasionally nod, but would not really wake up and interact with me much today.  Denies N/v.  Admits to some abdominal pain.  That's about all I can get out of her this morning. ? ?Objective: ?Vital signs in last 24 hours: ?Temp:  [99 ?F (37.2 ?C)-103 ?F (39.4 ?C)] 102.9 ?F (39.4 ?C) (05/16 3220) ?Pulse Rate:  [94-129] 100 (05/16 0803) ?Resp:  [16-28] 17 (05/16 0803) ?BP: (99-167)/(58-93) 127/81 (05/16 0803) ?SpO2:  [91 %-100 %] 95 % (05/16 0803) ?Last BM Date : 12/21/21 ? ?Intake/Output from previous day: ?05/15 0701 - 05/16 0700 ?In: 4517.1 [P.O.:200; I.V.:3490.6; IV Piggyback:826.5] ?Out: -  ?Intake/Output this shift: ?No intake/output data recorded. ? ?PE: ?Gen:  fatigued looking ?Abd: Soft, ND, some diffuse abdominal tenderness, some BS, no peritonitis. ? ?Lab Results:  ?Recent Labs  ?  12/25/21 ?0330 12/26/21 ?2542  ?WBC 15.4* 17.1*  ?HGB 13.1 13.2  ?HCT 38.6 37.5  ?PLT 216 209  ? ?BMET ?Recent Labs  ?  12/25/21 ?0330 12/26/21 ?7062  ?NA 135 133*  ?K 3.6 3.2*  ?CL 102 103  ?CO2 19* 23  ?GLUCOSE 76 187*  ?BUN <5* <5*  ?CREATININE 0.88 0.80  ?CALCIUM 8.4* 8.2*  ? ?PT/INR ?No results for input(s): LABPROT, INR in the last 72 hours. ?CMP  ?   ?Component Value Date/Time  ? NA 133 (L) 12/26/2021 0558  ? K 3.2 (L) 12/26/2021 0558  ? CL 103 12/26/2021 0558  ? CO2 23 12/26/2021 0558  ? GLUCOSE 187 (H) 12/26/2021 0558  ? BUN <5 (L) 12/26/2021 0558  ? CREATININE 0.80 12/26/2021 0558  ? CALCIUM 8.2 (L) 12/26/2021 0558  ? PROT 6.0 (L) 12/26/2021 0558  ? ALBUMIN 2.2 (L) 12/26/2021 0558  ? AST 27 12/26/2021 0558  ? ALT 16 12/26/2021 0558  ? ALKPHOS 78 12/26/2021 0558  ? BILITOT 1.0 12/26/2021 0558  ? GFRNONAA >60 12/26/2021 0558  ? GFRAA >60 10/19/2016 2000  ? ?Lipase  ?   ?Component Value Date/Time  ? LIPASE 24 12/26/2021 0558  ? ? ?Studies/Results: ?CT ABDOMEN PELVIS W CONTRAST ? ?Result Date: 12/25/2021 ?CLINICAL DATA:  Posterior gastric perforation with overlying complex  pancreatic lesion, now with fever and leukocytosis EXAM: CT ABDOMEN AND PELVIS WITH CONTRAST TECHNIQUE: Multidetector CT imaging of the abdomen and pelvis was performed using the standard protocol following bolus administration of intravenous contrast. RADIATION DOSE REDUCTION: This exam was performed according to the departmental dose-optimization program which includes automated exposure control, adjustment of the mA and/or kV according to patient size and/or use of iterative reconstruction technique. CONTRAST:  142mL OMNIPAQUE IOHEXOL 300 MG/ML SOLN IV. No oral contrast. COMPARISON:  12/22/2021 FINDINGS: Lower chest: Dependent atelectasis posterior lower lobes Hepatobiliary: Tiny nonspecific low-attenuation focus liver image 22 unchanged. No additional hepatic abnormalities or biliary dilatation. Minimal edema adjacent to gallbladder and generally in upper abdomen. Pancreas: Peripancreatic edema at proximal tail. Somewhat ill-defined low-attenuation focus within pancreatic tail proximally, 11 x 10 x 12 mm, question pseudocyst, now unilocular and decreased in size since previous exam. Remainder of pancreas normal. No calcifications or ductal dilatation. No additional mass. Spleen: Normal appearance.  Small adjacent splenule. Adrenals/Urinary Tract: Adrenal glands, kidneys, ureters, and bladder normal appearance Stomach/Bowel: Thickening of wall of gastric antrum especially posteriorly, extending into pylorus. Adjacent infiltration of lesser sac fat planes. Proximal stomach decompressed and otherwise unremarkable. Large and small bowel loops normal appearance. Normal appendix. Vascular/Lymphatic: Few scattered pelvic phleboliths. Vascular structures patent. Minimal  atherosclerotic calcification aorta. Few scattered normal sized iliac axis and periportal lymph nodes as well as few LEFT para-aortic nodes, nonenlarged. Reproductive: Uterus and LEFT ovary normal appearance. Cyst identified in RIGHT ovary 4.2 x 2.9 cm;  this is a simple appearing cyst and follow-up ultrasound in 3-6 months is recommended. Other: No free air or hernia.  Trace free fluid in cul-de-sac. Musculoskeletal: Osseous structures unremarkable. IMPRESSION: Peripancreatic edema at proximal tail of pancreas consistent with acute pancreatitis. Somewhat ill-defined low-attenuation focus within proximal pancreatic tail , 11 x 10 x 12 mm, question pseudocyst, decreased in size since previous exam. Thickening of wall of gastric antrum especially posteriorly, extending into pylorus, could be related favor gastritis versus ulcer disease. 4.2 cm diameter RIGHT ovarian cyst, simple appearing by CT; follow-up ultrasound recommended in 3-6 months. Aortic Atherosclerosis (ICD10-I70.0). Electronically Signed   By: Lavonia Dana M.D.   On: 12/25/2021 08:36  ? ?DG CHEST PORT 1 VIEW ? ?Result Date: 12/25/2021 ?CLINICAL DATA:  Fever and leukocytosis. EXAM: PORTABLE CHEST 1 VIEW COMPARISON:  CT chest and chest x-ray dated Dec 22, 2021. FINDINGS: The heart size and mediastinal contours are within normal limits. No focal consolidation, pleural effusion, or pneumothorax. No acute osseous abnormality. IMPRESSION: No active disease. Electronically Signed   By: Titus Dubin M.D.   On: 12/25/2021 09:57  ? ?DG UGI W SINGLE CM (SOL OR THIN BA) ? ?Result Date: 12/25/2021 ?CLINICAL DATA:  Perforated gastric ulcer of posterior antrum, now with leukocytosis and fever EXAM: DG UGI W SINGLE CM TECHNIQUE: Scout radiograph was obtained. Single contrast examination was performed using thin water soluble contrast. This exam was performed by Pasty Spillers, PA-C, and was supervised and interpreted by P. Patel MD. FLUOROSCOPY TIME:  Radiation Exposure Index (as provided by the fluoroscopic device): 24.70mG y Ref. Air Kerma COMPARISON:  CT 5/15. FINDINGS: Scout radiograph demonstrates a normal bowel gas pattern. Contrast is swallowed without difficulty. Esophagus is grossly unremarkable. The stomach  has an unremarkable single contrast appearance. There is no contrast extravasation to suggest perforation along the posterior wall. Limited evaluation for ulcer on this study. Visualized portions of the duodenum are unremarkable. IMPRESSION: No evidence for gastric perforation. Electronically Signed   By: Macy Mis M.D.   On: 12/25/2021 15:44   ? ?Anti-infectives: ?Anti-infectives (From admission, onward)  ? ? Start     Dose/Rate Route Frequency Ordered Stop  ? 12/25/21 1600  metroNIDAZOLE (FLAGYL) IVPB 500 mg  Status:  Discontinued       ? 500 mg ?100 mL/hr over 60 Minutes Intravenous Every 12 hours 12/25/21 0748 12/25/21 0842  ? 12/25/21 1400  ceFEPIme (MAXIPIME) 2 g in sodium chloride 0.9 % 100 mL IVPB  Status:  Discontinued       ? 2 g ?200 mL/hr over 30 Minutes Intravenous Every 8 hours 12/25/21 0748 12/25/21 0841  ? 12/25/21 0930  meropenem (MERREM) 2 g in sodium chloride 0.9 % 100 mL IVPB       ? 2 g ?280 mL/hr over 30 Minutes Intravenous Every 8 hours 12/25/21 0842    ? 12/22/21 2300  ceFEPIme (MAXIPIME) 2 g in sodium chloride 0.9 % 100 mL IVPB  Status:  Discontinued       ? 2 g ?200 mL/hr over 30 Minutes Intravenous Every 8 hours 12/22/21 1440 12/25/21 0720  ? 12/22/21 1445  ceFEPIme (MAXIPIME) 2 g in sodium chloride 0.9 % 100 mL IVPB       ? 2 g ?200 mL/hr over 30  Minutes Intravenous  Once 12/22/21 1436 12/22/21 1745  ? 12/22/21 1445  metroNIDAZOLE (FLAGYL) IVPB 500 mg  Status:  Discontinued       ? 500 mg ?100 mL/hr over 60 Minutes Intravenous Every 12 hours 12/22/21 1436 12/25/21 0721  ? ?  ? ? ? ?Assessment/Plan ?Posterior contained perforated gastric ulcer and pancreatic lesion ?- CT w/ 3.3 x 2.7 x 2.6 cm complex multicystic lesion along the cranial ?margin of the pancreatic body extending up adjacent to the gastric fundus inferior to the esophagogastric junction. GI and our team has reviewed and felt 2/2 posterior contained perforated gastric ulcer. No current indication for surgical  intervention.  ?- High risk w/ longstanding hx of NSAID use (50 BC powders per week) and has been on prednisone for the past 6 to 8 weeks ?- H. Pylori pending ?- PPI gtt, IV abx ?- continues to be febrile to 103 today.  T

## 2021-12-26 NOTE — Progress Notes (Signed)
Pt is currently refusing IV access "for an hour or 2". She said she wants to "give her arms a rest". Physician notified.  ?

## 2021-12-26 NOTE — Progress Notes (Signed)
Spoke with family medicine resident. Notified temp 102.4 orally. Ice packs applied. IV tylenol given as scheduled. No new orders at this time. Patient resting comfortably. Will continue to monitor. ?

## 2021-12-26 NOTE — Progress Notes (Signed)
?   12/25/21 1953  ?Assess: MEWS Score  ?Temp (!) 102.5 ?F (39.2 ?C)  ?BP (!) 143/82  ?Pulse Rate (!) 123  ?Resp 20  ?Level of Consciousness Alert  ?SpO2 96 %  ?O2 Device Room Air  ?Assess: MEWS Score  ?MEWS Temp 2  ?MEWS Systolic 0  ?MEWS Pulse 2  ?MEWS RR 0  ?MEWS LOC 0  ?MEWS Score 4  ?MEWS Score Color Red  ?Assess: if the MEWS score is Yellow or Red  ?Were vital signs taken at a resting state? Yes  ?Focused Assessment No change from prior assessment  ?Early Detection of Sepsis Score *See Row Information* Low  ?MEWS guidelines implemented *See Row Information* Yes  ?Notify: Charge Nurse/RN  ?Name of Charge Nurse/RN Notified [place], RN  ?Date Charge Nurse/RN Notified 12/25/21  ?Time Charge Nurse/RN Notified 2000  ? ?Pt. Transferred off department  ?

## 2021-12-26 NOTE — Progress Notes (Signed)
Secure chat with primary RN regarding PICC placement. RN made aware of PICC placement likely tomorrow, 12/27/21. Pt has been febrile this shift and currently has 2 PIV's. ?

## 2021-12-26 NOTE — Progress Notes (Signed)
FPTS Interim Progress Note ? ?S: Saw pt at bedside today. Pt was in bed alert and reports feeling much better than earlier. She reports she does not remember what happened this morning or that her husband was present at bedside today. Her RUQ pain is currently 7/10. The pain medications are working well for her right now. She does not feel febrile.  ? ?O: ?BP (!) 109/59 (BP Location: Right Arm)   Pulse 95   Temp 98.9 ?F (37.2 ?C) (Oral)   Resp 17   Ht 5\' 6"  (1.676 m)   Wt 74.8 kg   SpO2 94%   BMI 26.62 kg/m?   ? ?General: Alert, no acute distress ?Cardio: well perfused  ?Pulm: normal work of breathing ?Abdomen: RUQ tenderness, no guarding, abdomen non distended  ?Extremities: No peripheral edema. Moving all 4 limbs equally ?Neuro: Cranial nerves grossly intact  ?Skin: full body skin exam performed, no active lesions/cuts/wounds as potential source of infection  ? ?A/P: ?Pancreatitis, likely gastric ulcer  ?GI following appreciate recs-spoke to Dr Bryan Lemma at bedside. Possible thin walled gastric ulcer. Plan for carafate overnight, if no improvement GI will do EGD later this week. In addition: ?-Clear liquid diet ?-Continue iv fluids ?-Monitor fever curve ?-IV tylenol Q6H for fever and pain  ?-IV Dilaudid 0.5mg  Q6H for pain ?-Continue meropenem ?-If no improvement in sx and persistent fevers tomorrow will consult ID (GI in agreement) ?-Low threshold to contact FPTS if RN has any concerns regarding pt  ? ? ?Lattie Haw, MD ?12/26/2021, 5:05 PM ?PGY-3, [place] ?Service pager [phone]  ?

## 2021-12-26 NOTE — Progress Notes (Signed)
FPTS Brief Progress Note ? ?S: Went to examine patient and she was laying down in bed with daughter bedside. She was in good spirits but did endorse not feeling well in general. Does not feel any worse. Feels her stomach is more distended. Stated she was not sure what happened yesterday but was told she was out of it. States she kept having fevers but felt the heat was on, she had something on her back and was under several covers. She states she has been drinking water, and broth. Denies any questions or concernss.  ? ? ?O: ?BP 119/75 (BP Location: Right Arm)   Pulse 99   Temp 99 ?F (37.2 ?C) (Oral)   Resp 19   Ht 5\' 6"  (1.676 m)   Wt 74.8 kg   SpO2 94%   BMI 26.62 kg/m?   ? ?Physical exam ?General: well appearing, laying in bed, NAD ?Cardiovascular: RRR  ?Lungs: Normal WOB on RA  ?Abdomen: soft, non-distended, normal BS. TTP in RUQ ?Skin: warm, dry. No edema ? ?A/P: ?Pancreatitis and likely gastric ulcer  ?Patient still with RUQ pain. GI following who started patient on carafate and if helps can attribute pain to ulcer.  ?- continue to closely monitor vitals and fever curve ?- clear liquid diet ?- LR 150 mL/h  ?- Meropenem 2g q 8h ?- Protonix 40mg  IV q 12 h  ?- Carafate 1g TID  ?- Pain relief with Tylenol 1000mg  q 6h, Dilaudid .5mg  q 6h,  lidocaine patch  ?- am CBC, CMP  ?- If fevers despite being on meropenem, day team to contact ID tomorrow  ? ? ?Shary Key, DO ?12/26/2021, 8:53 PM ?PGY-2, [place] Night Resident  ?Please page [phone] with questions.  ?  ?
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
## 2021-12-26 NOTE — Progress Notes (Addendum)
FPTS Brief Progress Note ? ?S Saw patient at bedside this evening. Patient laying comfortably in bed. She said "so we still don't know what's going on?" I reviewed imaging findings and treatment w/ patient. She voiced understanding and had no further questions. ? ?Her abdominal pain is improved, but still present. No nausea, vomiting, chest pain, pain with breathing, or other significant symptoms.  ? ?Pt placed on 1L [place] by RN, who placed her on oxygen because she "desaturated to 88% when she arrived to the unit" although no SpO2 desaturations noted on documentation. Removed O2 while I was in the room, SpO2 remained >95%. ? ?O: ?BP (!) 148/82 (BP Location: Right Arm)   Pulse (!) 122   Temp (!) 102.4 ?F (39.1 ?C) (Axillary)   Resp (!) 22   Ht 5\' 6"  (1.676 m)   Wt 74.8 kg   SpO2 99%   BMI 26.62 kg/m?   ? ?General: Alert, no acute distress ?Cardio: RRR ?Pulm: CTAB, normal work of breathing ?Abdomen: Abdomen soft, mildly tender to palpation but improved from exam yesterday. No guarding. ?Extremities: No peripheral edema.  ?Neuro: Cranial nerves grossly intact  ? ?A/P: ?Posterior perforated gastric ulcer/ Peripancreatic edema ?Still w/ red MEWS, most recently febrile to 102.4 F w/ tachycardia in 110s ?Monitor VS closely- Tylenol for fever ?ID- On IV Meropenem ? ?Plan per day team  ?- Orders reviewed. Labs for AM ordered, which was adjusted as needed.  ?- If clinical condition changes (worsening fever, worsened abdominal pain or change in physical exam) plan includes obtain CT abdomen/pelvis.  ? ?Orvis Brill, DO ?12/26/2021, 3:41 AM ?PGY-3, [facility] Night Resident  ?Please page [phone] with questions.  ? ?

## 2021-12-26 NOTE — Progress Notes (Signed)
Spoke with Dr. Posey Pronto regarding PICC order. MD ok to revisit placement tomorrow and make a decision at that time. ?

## 2021-12-27 ENCOUNTER — Inpatient Hospital Stay (HOSPITAL_COMMUNITY): Payer: Medicare Other

## 2021-12-27 DIAGNOSIS — R011 Cardiac murmur, unspecified: Secondary | ICD-10-CM | POA: Diagnosis not present

## 2021-12-27 DIAGNOSIS — R911 Solitary pulmonary nodule: Secondary | ICD-10-CM

## 2021-12-27 LAB — CULTURE, BLOOD (ROUTINE X 2)
Culture: NO GROWTH
Culture: NO GROWTH

## 2021-12-27 LAB — CBC WITH DIFFERENTIAL/PLATELET
Abs Immature Granulocytes: 0.11 10*3/uL — ABNORMAL HIGH (ref 0.00–0.07)
Basophils Absolute: 0 10*3/uL (ref 0.0–0.1)
Basophils Relative: 0 %
Eosinophils Absolute: 0 10*3/uL (ref 0.0–0.5)
Eosinophils Relative: 0 %
HCT: 36.4 % (ref 36.0–46.0)
Hemoglobin: 12.5 g/dL (ref 12.0–15.0)
Immature Granulocytes: 1 %
Lymphocytes Relative: 3 %
Lymphs Abs: 0.4 10*3/uL — ABNORMAL LOW (ref 0.7–4.0)
MCH: 29.4 pg (ref 26.0–34.0)
MCHC: 34.3 g/dL (ref 30.0–36.0)
MCV: 85.6 fL (ref 80.0–100.0)
Monocytes Absolute: 0.4 10*3/uL (ref 0.1–1.0)
Monocytes Relative: 2 %
Neutro Abs: 15.4 10*3/uL — ABNORMAL HIGH (ref 1.7–7.7)
Neutrophils Relative %: 94 %
Platelets: 217 10*3/uL (ref 150–400)
RBC: 4.25 MIL/uL (ref 3.87–5.11)
RDW: 14.6 % (ref 11.5–15.5)
WBC: 16.3 10*3/uL — ABNORMAL HIGH (ref 4.0–10.5)
nRBC: 0 % (ref 0.0–0.2)

## 2021-12-27 LAB — COMPREHENSIVE METABOLIC PANEL
ALT: 28 U/L (ref 0–44)
AST: 45 U/L — ABNORMAL HIGH (ref 15–41)
Albumin: 2.1 g/dL — ABNORMAL LOW (ref 3.5–5.0)
Alkaline Phosphatase: 73 U/L (ref 38–126)
Anion gap: 8 (ref 5–15)
BUN: <5 mg/dL — ABNORMAL LOW (ref 6–20)
CO2: 24 mmol/L (ref 22–32)
Calcium: 7.9 mg/dL — ABNORMAL LOW (ref 8.9–10.3)
Chloride: 102 mmol/L (ref 98–111)
Creatinine, Ser: 0.69 mg/dL (ref 0.44–1.00)
GFR, Estimated: >60 mL/min (ref >60)
Glucose, Bld: 97 mg/dL (ref 70–99)
Potassium: 3.3 mmol/L — ABNORMAL LOW (ref 3.5–5.1)
Sodium: 134 mmol/L — ABNORMAL LOW (ref 135–145)
Total Bilirubin: 0.5 mg/dL (ref 0.3–1.2)
Total Protein: 5.6 g/dL — ABNORMAL LOW (ref 6.5–8.1)

## 2021-12-27 LAB — ECHOCARDIOGRAM COMPLETE
AR max vel: 2.18 cm2
AV Area VTI: 2.48 cm2
AV Area mean vel: 2.17 cm2
AV Mean grad: 6 mmHg
AV Peak grad: 11.2 mmHg
Ao pk vel: 1.67 m/s
Area-P 1/2: 7.51 cm2
Height: 66 in
S' Lateral: 3.4 cm
Weight: 2638.47 oz

## 2021-12-27 LAB — PROCALCITONIN: Procalcitonin: >150 ng/mL

## 2021-12-27 MED ORDER — ACETAMINOPHEN 10 MG/ML IV SOLN
1000.0000 mg | Freq: Four times a day (QID) | INTRAVENOUS | Status: AC
  Administered 2021-12-27 – 2021-12-28 (×4): 1000 mg via INTRAVENOUS
  Filled 2021-12-27 (×4): qty 100

## 2021-12-27 MED ORDER — VANCOMYCIN HCL IN DEXTROSE 1-5 GM/200ML-% IV SOLN
1000.0000 mg | Freq: Two times a day (BID) | INTRAVENOUS | Status: DC
  Administered 2021-12-27 – 2021-12-28 (×2): 1000 mg via INTRAVENOUS
  Filled 2021-12-27 (×2): qty 200

## 2021-12-27 NOTE — Progress Notes (Incomplete)
Family Medicine Teaching Service ?Daily Progress Note ?Intern Pager: [phone] ? ?Patient name: Crystal Mora Medical record number: [id number] ?Date of birth: [birth year] Age: 46 y.o. Gender: female ? ?Primary Care Provider: [facility] ?Consultants: General surgery, GI, ID ?Code Status: Full ? ?Pt Overview and Major Events to Date:  ?5/12: Admitted ?5/13: GI signed off ?5/16: GI signed on ?5/17: ID consulted ? ?Assessment and Plan: ? ?Crystal Mora is a 46 year old female presenting with chest pain and abdominal pain suspicious of pancreatitis found to have multicystic lesions of the pancreas.  PMH significant for bipolar depression, HTN, HLD. ? ?Abdominal tenderness with fever ?Patient reports she is***.  Vitals overnight showed***.  BMP this morning was***.  On exam patient was***.  Upper and lower extremity exams yesterday showed***.  Spine imaging showed***. ?-GI following, appreciate recs ?-ID following, appreciate recs ?-Continue meropenem 2 g every 8 hours ?-Continue MIVF LR 100 cc/hour ?-Pain control regimen ? IV Tylenol 1000 mg every 6 hours ? IV Dilaudid 0.5 mg every 4 hours ?-Continue Carafate 1 g 3 times daily ?-Continue Protonix 40 mg ?-Consider outpatient MRI per GI ? ?Hypokalemia ?Potassium this morning was*** ?-AM BPM ? ?LLL cavitary lung lesion ?Asymptomatic.  Quant gold result expected 5/20.  Given recurrent fever we will consult pulmonology. ?-Consult pulmonology, appreciate recs ?-Repeat CT in 3 months ?-Follow-up quant gold ? ?Other stable conditions ?Hypertension- ?Hidradenitis suppurativa.-Holding Humira ?Chronic back pain-continue pain regimen as listed above ?Insomnia/anxiety-continue Valium twice daily ?- ? ?FEN/GI: Regular diet ?PPx: *** ?Dispo:{FPTSDISPOLIST:25765} {FPTSDISPOTIME:25766}. Barriers include ***.  ? ?Subjective:  ?*** ? ?Objective: ?Temp:  [98.3 ?F (36.8 ?C)-102.8 ?F (39.3 ?C)] 98.3 ?F (36.8 ?C) (05/17 1619) ?Pulse Rate:  [90-110] 90  (05/17 1619) ?Resp:  [17-20] 17 (05/17 1619) ?BP: (115-143)/(73-97) 115/81 (05/17 1619) ?SpO2:  [91 %-99 %] 99 % (05/17 1619) ?Physical Exam: ?General: *** ?Cardiovascular: *** ?Respiratory: *** ?Abdomen: *** ?Extremities: *** ? ?Laboratory: ?Recent Labs  ?Lab 12/25/21 ?0330 12/26/21 ?3291 12/27/21 ?0101  ?WBC 15.4* 17.1* 16.3*  ?HGB 13.1 13.2 12.5  ?HCT 38.6 37.5 36.4  ?PLT 216 209 217  ? ?Recent Labs  ?Lab 12/22/21 ?1020 12/23/21 ?0233 12/25/21 ?0330 12/26/21 ?9166 12/27/21 ?0101  ?NA 139   < > 135 133* 134*  ?K 3.5   < > 3.6 3.2* 3.3*  ?CL 104   < > 102 103 102  ?CO2 26   < > 19* 23 24  ?BUN 9   < > <5* <5* <5*  ?CREATININE 0.84   < > 0.88 0.80 0.69  ?CALCIUM 8.5*   < > 8.4* 8.2* 7.9*  ?PROT 6.8  --   --  6.0* 5.6*  ?BILITOT 0.6  --   --  1.0 0.5  ?ALKPHOS 68  --   --  78 73  ?ALT 13  --   --  16 28  ?AST 12*  --   --  27 45*  ?GLUCOSE 119*   < > 76 187* 97  ? < > = values in this interval not displayed.  ? ? ?*** ? ?Imaging/Diagnostic Tests: ?*** ? ?Alen Bleacher, MD ?12/27/2021, 5:04 PM ?PGY-***, [place] ?[place] Intern pager: [phone], text pages welcome ? ?
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
## 2021-12-27 NOTE — Consult Note (Signed)
? ?NAME:  Crystal Mora, MRN:  [id number], DOB:  1976/07/11, LOS: 5 ?ADMISSION DATE:  12/22/2021, CONSULTATION DATE:  5/17 REFERRING MD:  Erin Hearing, CHIEF COMPLAINT:  cavitary pulmonary nodule   ? ?History of Present Illness:  ?18 yof admitted 5/12 w/ cc abd pain and nausea. Dx eval after GI consultation was concerning for perforated gastric ulcer.  She was seen by surgery and conservative treatment w/ antibiotics were recommended. During her initial evaluation CT imaging of Chest/abd and pelvis were completed. In addition to the above abdominal pathology a 12 mm cavitary nodule was identified in the RLL for which we were consulted.  ? ?Pertinent  Medical History  ?Bipolar disease type I, chronic urticaria on xolair, depression, anxiety, HTN, thyroid disease, GERD, Chronic low back pain, former tobacco abuse. Was on Humira for HS  ?Significant Hospital Events: ?Including procedures, antibiotic start and stop dates in addition to other pertinent events   ?5/12 admitted. Seen by GI and surg. Treated conservatively for perf gastric ulcer w/ hydration, abx, PPI and bowel rest. CT imaging was reviewed and concern for pancreatic cyst noted by radiology was felt could be contained perforation but would eventually need pancreatic MRI ?5/15 spiking fevers, abx changed to meropenem, UGI series done: no evidence of perf  ?5/16 GI called back.  ?5/17 seen by ID for on-going fevers. Pulm asked to see  ? ?Interim History / Subjective:  ?Complains of metallic taste in her mouth ?Abdominal pain is improved  ?Has some persistent right upper quadrant pain ?Fever 102.8 documented between 8 and 11 AM ? ?Objective   ?Blood pressure (Abnormal) 143/97, pulse 100, temperature (Abnormal) 102.7 ?F (39.3 ?C), temperature source Oral, resp. rate 17, height 5\' 6"  (1.676 m), weight 74.8 kg, SpO2 95 %. ?   ?   ? ?Intake/Output Summary (Last 24 hours) at 12/27/2021 1620 ?Last data filed at 12/27/2021 1516 ?Gross per 24 hour  ?Intake 4642.3 ml   ?Output no documentation  ?Net 4642.3 ml  ? ?Filed Weights  ? 12/22/21 1014  ?Weight: 74.8 kg  ? ? ?Examination: ?General: Well-appearing African-American woman, sitting in bed, no distress ?HENT: No pallor, icterus, no JVD or neck lymphadenopathy ?Lungs: Clear bilateral, no accessory muscle use ?Cardiovascular: S1-S2 regular ?Abdomen: Soft, nontender, no hepatosplenomegaly ?Extremities: No edema, Axillary skin lesions of hidradenitis ?Neuro: Alert, interactive, nonfocal ? ? ?Labs show mild leukocytosis with left shift , hyponatremia and hypokalemia, albumin 2.1, normal lipase ?HIV neg ? ?Resolved Hospital Problem list   ? ? ?Assessment & Plan:  ?Right lower lobe subpleural 12 mm cavitary nodule ?Persistent fevers after admission since 5/15 with leukocytosis, concerning for infectious process,  ?-concern here would be for occult/partially treated infective endocarditis.  I note that blood cultures were negative even prior to starting antibiotics on 5/12 and again on 5/15 ?-Tuberculosis unlikely, location is right lower lobe, no preceding history and presentation not compatible ?-Low suspicion for malignancy, she smoked for less than 10 pack years, does have family history of lung cancer on her father side ? ?-Await results of TTecho ?-Obtain procalcitonin, if high would suggest adding staph coverage ?-Biopsy of peripheral nodule can be performed with CT guidance but high risk of pneumothorax and yield would be low ?-Follow-up CT chest without contrast in 6 to 8 weeks depending on course ? ? ?Pancreatic cyst/radiologic evidence of pancreatitis -not sure how to tie this to lung findings, GI following, EGD deferred ? ?Best Practice (right click and "Reselect all SmartList Selections" daily)  ? ?Per  primary service ? ?Labs   ?CBC: ?Recent Labs  ?Lab 12/23/21 ?0233 12/24/21 ?0307 12/25/21 ?0330 12/26/21 ?8315 12/27/21 ?0101  ?WBC 12.0* 10.7* 15.4* 17.1* 16.3*  ?NEUTROABS  --   --   --   --  15.4*  ?HGB 13.1 12.4 13.1  13.2 12.5  ?HCT 39.9 38.7 38.6 37.5 36.4  ?MCV 88.7 88.8 86.4 84.5 85.6  ?PLT 216 222 216 209 217  ? ? ?Basic Metabolic Panel: ?Recent Labs  ?Lab 12/23/21 ?0233 12/24/21 ?0307 12/25/21 ?0330 12/26/21 ?1761 12/27/21 ?0101  ?NA 140 136 135 133* 134*  ?K 3.7 3.1* 3.6 3.2* 3.3*  ?CL 108 101 102 103 102  ?CO2 25 24 19* 23 24  ?GLUCOSE 96 87 76 187* 97  ?BUN 6 6 <5* <5* <5*  ?CREATININE 0.80 0.74 0.88 0.80 0.69  ?CALCIUM 8.2* 8.2* 8.4* 8.2* 7.9*  ?MG  --  1.9  --   --   --   ? ?GFR: ?Estimated Creatinine Clearance: 92.8 mL/min (by C-G formula based on SCr of 0.69 mg/dL). ?Recent Labs  ?Lab 12/22/21 ?1525 12/22/21 ?2301 12/23/21 ?6073 12/24/21 ?7106 12/25/21 ?0330 12/26/21 ?2694 12/27/21 ?0101  ?WBC  --   --    < > 10.7* 15.4* 17.1* 16.3*  ?LATICACIDVEN 0.7 0.5  --   --   --   --   --   ? < > = values in this interval not displayed.  ? ? ?Liver Function Tests: ?Recent Labs  ?Lab 12/22/21 ?1020 12/26/21 ?8546 12/27/21 ?0101  ?AST 12* 27 45*  ?ALT 13 16 28   ?ALKPHOS 68 78 73  ?BILITOT 0.6 1.0 0.5  ?PROT 6.8 6.0* 5.6*  ?ALBUMIN 3.1* 2.2* 2.1*  ? ?Recent Labs  ?Lab 12/22/21 ?1020 12/25/21 ?0330 12/26/21 ?2703  ?LIPASE 26 25 24   ? ?No results for input(s): AMMONIA in the last 168 hours. ? ?ABG ?No results found for: PHART, PCO2ART, PO2ART, HCO3, TCO2, ACIDBASEDEF, O2SAT  ? ?Coagulation Profile: ?No results for input(s): INR, PROTIME in the last 168 hours. ? ?Cardiac Enzymes: ?No results for input(s): CKTOTAL, CKMB, CKMBINDEX, TROPONINI in the last 168 hours. ? ?HbA1C: ?No results found for: HGBA1C ? ?CBG: ?No results for input(s): GLUCAP in the last 168 hours. ? ?Review of Systems:   ?Right upper quadrant abdominal pain ?Epigastric pain has resolved, no burning, no nausea or vomiting ?Bowel movements okay ? ? ?Past Medical History:  ?She,  has a past medical history of Anxiety, Bipolar 1 disorder ([place]), Chronic urticaria, Depression, Diabetes mellitus without complication ([place]), Hypertension, Insomnia, Neuromuscular disorder  ([place]), and Thyroid disease.  ? ?Surgical History:  ? ?Past Surgical History:  ?Procedure Laterality Date  ? CESAREAN SECTION    ? THYROID SURGERY    ? TUBAL LIGATION    ? tubal reversal Bilateral   ?  ? ?Social History:  ? reports that she has quit smoking. Her smoking use included cigars and cigarettes. She smoked an average of .5 packs per day. She has never used smokeless tobacco. She reports current alcohol use. She reports that she does not use drugs.  ? ?Family History:  ?Her family history includes Asthma in her mother; COPD in her mother; Depression in her maternal aunt and mother; Diabetes in her maternal aunt, maternal grandmother, and mother; Mental illness in her brother, daughter, maternal aunt, and mother.  ? ?Allergies ?Allergies  ?Allergen Reactions  ? Biaxin [Clarithromycin]   ? Morphine And Related Hives  ?  Says it is okay with benadryl administration  ?  Penicillins Hives, Itching and Swelling  ?  Has patient had a PCN reaction causing immediate rash, facial/tongue/throat swelling, SOB or lightheadedness with hypotension: No ?Has patient had a PCN reaction causing severe rash involving mucus membranes or skin necrosis: No ?Has patient had a PCN reaction that required hospitalization No ?Has patient had a PCN reaction occurring within the last 10 years: Yes ?If all of the above answers are "NO", then may proceed with Cephalosporin use. ?  ? Percocet [Oxycodone-Acetaminophen] Hives and Itching  ? Shrimp [Shellfish Allergy]   ? Citalopram Anxiety  ?  aggression  ? Topiramate Rash  ?  ? ?Home Medications  ?Prior to Admission medications   ?Medication Sig Start Date End Date Taking? Authorizing Provider  ?amLODipine (NORVASC) 2.5 MG tablet Take 2.5 mg by mouth daily. 12/11/21  Yes [provider]  ?aspirin EC 81 MG tablet Take 81 mg by mouth daily.   Yes [provider]  ?Aspirin-Salicylamide-Caffeine (BC HEADACHE POWDER PO) Take 1 Package by mouth daily as needed (headache).   Yes  [provider]  ?celecoxib (CELEBREX) 100 MG capsule Take 100 mg by mouth daily. 12/04/21  Yes [provider]  ?cetirizine (ZYRTEC) 10 MG tablet Take 10 mg by mouth daily.   Yes Provider, Histor

## 2021-12-27 NOTE — Consult Note (Addendum)
?   ?I have seen and examined the patient. I have personally reviewed the clinical findings, laboratory findings, microbiological data and imaging studies. The assessment and treatment plan was discussed with the resident Gaylan Gerold. I agree with her/his recommendations except following additions/corrections. ? ?46 YO female with h/o Bipolar 2, GAD, Depression, HTN, HLD, HS on Humira who presented to the ED 5/12 with chest pain and upper abdominal pain for 1 day. ? ?At ED, afebrile, stable VS, WBC 16.4, HIV NR, Quantiferon pending ? ?Imaging/microbiologic data reviewed ? ?Denies cough, SOB, loss of appetite, but gives h/o unspecified voluntary weight loss after her DM diagnosis. She was also started on prednisone with a tapering course recently for ? Sciatica/back pain. She was also started on Humira a year ago, gets injection twice a month, last one week ago PTA. Used to smoke cigarettes but recently smokes cigars, drinks alcohol occasionally and denies IVDU. H/o being homeless but denies h/o incarceration, known contact with TB , h/o TB in self or family members. Has 2 dogs at home. Denies h/o travel out of the country. Denies GU symptoms , joint pain and rashes. She however feels her abdomen distended.  ? ?GI and Surgery has been consulted with concerns for posterior perforated GU in the setting of excessive NSAID use with recent steroid and she is on medical management for it. ? ?Fever seems to have developed while in the hospital. She tells me she had difficult PIV placed in her rt upper extremity 2 days ago resulting in significant pain and swelling. Concern for thrombophlebitis resulting fevers.  ? ?Low suspicion for endocarditis given clinical presentation and negative blood cultures before abtx. TTE is pending at this time.  ? ?Low suspicion for TB as well given no suggestive symptoms/location of nodule. She tells me she had negative quantiferon a year ago before starting humira. She does have h/o smoking  and family h/o lung ca. ? ?Unclear cause of abdominal process seen in CT with acute pancreatitis/Pseudocyst. Lipase normal. EGD deferred currently, eventual plan for outpatient MRI pancreas protocol with EUS per GI ? ?Recommend to add Vancomycin empirically  ?Continue meropenem ?Fu blood cultures  ?Duplex of UE and LE ?Engage Pulmonary  ?Fu Surgery and GI recs  ?D/w primary  ? ?Crystal Oz, MD ?Infectious Disease Physician ?[facility] ?[address] ?[place], [place] [address] ?Phone: [phone]  Fax: [phone] ? ? ? ? ? ?[facility]   ? ?Date of Admission:  12/22/2021    ?Total days of antibiotics 6 ?Cefepime/metronidazole 5/12 - 5/15 ?Meropenem 5/15 -        ?      ?Reason for Consult: Hospital-acquired fever    ?Referring Provider: Dr. Erin Hearing ? ?Assessment: ?Patient with history of at HS on Humira presented for chest pain and abdominal pain, now developed unexplained fever.  Patient was afebrile when she first presented to the ED.  Fever was started 3 days after admission so this should be something she acquired during this hospitalization. ? ?She is thought to have contained perforated gastric ulcer which pending EGD evaluation.  However I do not think this would cause her fever.   ? ?Review of system were mostly negative except for abdominal pain.  She has an incidental finding of LLL cavitary lesion. Low suspicion for TB however with the lack of symptoms.  She should have a negative TB test  prior to starting Humira.  I would recommend  pulmonology consult to rule out neoplasm given history of smoking. ? ?Multiple blood culture were obtained and all negative.  Urine culture also negative.  No evidence of hepatic or biliary issue on CT.  Lumbar x-ray today was also negative for acute change.  No history of IV drug use. ? ?Patient reports issue with IV infiltration on the right arm few days ago which caused her arm to be  swollen and painful.  Recommend obtain ultrasound Doppler of bilateral upper and lower extremity to rule out thrombus.  ? ?Overall she is not ill-appearing clinically.  Continue meropenem empirically for now until we can find a source.  ? ?Plan: ?Continue meropenem ?Recommend obtain ultrasound Doppler of bilateral upper and lower extremity ?Recommend pulmonology consult for left lower lobe nodule ?Pending echocardiogram ?Monitor fever curve ?Pending repeat blood culture results ? ?Principal Problem: ?  Epigastric pain ?Active Problems: ?  Acute gastric perforation ?  Pancreatic cyst ?  Gastric ulcer without hemorrhage or perforation ?  Leukocytosis ? ? ?Scheduled Meds: ? diazepam  2.5 mg Intravenous BID  ? estradiol  0.05 mg Transdermal Weekly  ? Glycerin (Adult)  1 suppository Rectal Daily  ? heparin  5,000 Units Subcutaneous Q8H  ?  HYDROmorphone (DILAUDID) injection  0.5 mg Intravenous Q6H  ? lidocaine  1 patch Transdermal Q24H  ? pantoprazole  40 mg Intravenous Q12H  ? rosuvastatin  5 mg Oral Daily  ? sucralfate  1 g Oral TID WC & HS  ? ?Continuous Infusions: ? acetaminophen Stopped (12/27/21 1232)  ? lactated ringers 150 mL/hr at 12/27/21 1516  ? meropenem (MERREM) IV 280 mL/hr at 12/27/21 1516  ? ?PRN Meds:. ? ?HPI: Crystal Mora is a 46 y.o. female living with bipolar disorder, hypertension, hyperlipidemia, HS on Humira who presents to the hospital for chest pain abdominal pain, now developed fever during this hospitalization. ? ?Patient came in to the hospital due to chest pain abdominal pain.  States that chest pain has improved but abdominal pain persisted.  States that her abdomen has been more distended as well. ? ?Patient denies any respiratory symptoms including shortness of breath, coughing or sputum production.  She thinks that she was tested for TB before starting Humira.  She denies exposure to TB.  Reports history of homelessness but no incarceration.  She endorses weight loss but intentional  due to new diagnosis of diabetes.  Otherwise she denies abdominal pain, nausea, vomiting, diarrhea, urinary symptoms, STI symptoms including vaginal discharge or itching.   ? ?She is from [place] and has not traveled outside of the country.  Endorses smoking history for 7 years and currently smoking cigar.  Occasional alcohol use with no drug use, including IV drug use. ? ?She reports infiltrated IV on her right arm about 2 days ago which she reports swollen right upper arm.  States that the swelling has gone down significantly this morning.  ? ?Review of Systems: ?ROS ?Per HPI ? ?Past Medical History:  ?Diagnosis Date  ? Anxiety   ? Bipolar 1 disorder ([place])   ? Chronic urticaria   ? Depression   ? Diabetes mellitus without complication ([place])   ? Hypertension   ? states she has not took BP medication for 2 years  ? Insomnia   ? Neuromuscular disorder ([place])   ? back  ? Thyroid disease   ? ?Past Surgical History:  ?Procedure Laterality Date  ? CESAREAN SECTION    ? THYROID SURGERY    ? TUBAL LIGATION    ?  tubal reversal Bilateral   ? ? ? ?Social History  ? ?Tobacco Use  ? Smoking status: Former  ?  Packs/day: 0.50  ?  Types: Cigars, Cigarettes  ? Smokeless tobacco: Never  ? Tobacco comments:  ?  1 cigar daily  ?Vaping Use  ? Vaping Use: Every day  ? Substances: Flavoring  ?Substance Use Topics  ? Alcohol use: Yes  ?  Comment: Rare  ? Drug use: No  ? ? ?Family History  ?Problem Relation Age of Onset  ? Depression Mother   ? Mental illness Mother   ? Asthma Mother   ? COPD Mother   ? Diabetes Mother   ? Mental illness Brother   ? Mental illness Daughter   ? Diabetes Maternal Aunt   ? Mental illness Maternal Aunt   ? Depression Maternal Aunt   ? Diabetes Maternal Grandmother   ? ?Allergies  ?Allergen Reactions  ? Biaxin [Clarithromycin]   ? Morphine And Related Hives  ?  Says it is okay with benadryl administration  ? Penicillins Hives, Itching and Swelling  ?  Has patient had a PCN reaction causing immediate rash,  facial/tongue/throat swelling, SOB or lightheadedness with hypotension: No ?Has patient had a PCN reaction causing severe rash involving mucus membranes or skin necrosis: No ?Has patient had a PCN reaction t

## 2021-12-27 NOTE — Progress Notes (Addendum)
? ? Attending physician's note  ? ?I have taken a history, reviewed the chart, and examined the patient. I performed a substantive portion of this encounter, including complete performance of at least one of the key components, in conjunction with the APP. I agree with the APP's note, impression, and recommendations with my edits.  ? ?She reports that her abdominal pain is improved today compared with yesterday.  Not much of an appetite, but she attributes that to her metallic taste.  Exam improved compared with yesterday. ? ?Continues to have undulating fevers with Tmax 102.8.  ID service consulted by primary Medicine team.  Currently prescribed meropenem and cefepime. ? ? WBC downtrending ? H. pylori IgG negative ? Blood cultures NGT ? ? ?1) RUQ pain ?2) Suspected PUD ?3) Antral wall thickening on CT ?4) Radiographic evidence of pancreatitis ?- BISAP score 1 (fever, leukocytosis, which is improving).  Pain improving. ?- Based on her described improvement in abdominal pain since yesterday, I think we can hold off on EGD as this would be largely diagnostic. ?- Continue Protonix ?- Continue Carafate ACHS ?- Continue clear liquids and slowly advance as tolerated ?- Dysguesia and metallic taste can be seen with beta lactams.  Will monitor for improvement with cefepime cessation (also on meropenem low) ?- Pain control per primary team ? ?5) Fever ?- ID consult for continued fevers ?- TTE completed earlier today ?- Antipyretics per primary team ? ?6) Pancreatic cyst ?- Eventual plan for outpatient MRI pancreas protocol with EUS as appropriate based on findings ? ?[address], [place], FACG ?[phone] office  ? ?   ? ?       ? ? Daily Rounding Note ? ?12/27/2021, 1:06 PM ? LOS: 5 days  ? ?SUBJECTIVE:   ?Chief complaint:   Suspicion perforated gastric ulcer.  Pancreatic cystic lesion. ? ?Received 3.5 mg Dilaudid yesterday for pain management, 1 mg so far today.  Says  pain is better, now mostly on R abdomen.  Feels like her belly is "swollen".  No nausea.  However her appetite is depressed, she attributes this to everything she eats or drinks other than water tastes metallic. ? ?OBJECTIVE:        ? Vital signs in last 24 hours:    ?Temp:  [98.8 ?F (37.1 ?C)-102.8 ?F (39.3 ?C)] 102.7 ?F (39.3 ?C) (05/17 1153) ?Pulse Rate:  [95-110] 100 (05/17 1153) ?Resp:  [17-20] 17 (05/17 1153) ?BP: (109-143)/(59-97) 143/97 (05/17 1153) ?SpO2:  [91 %-95 %] 95 % (05/17 1153) ?Last BM Date : 12/21/21 ?Filed Weights  ? 12/22/21 1014  ?Weight: 74.8 kg  ? ?General: Looks more comfortable.  Does not look ill.  Much more alert and calm today than yesterday ?Heart: RRR. ?Chest: No labored breathing or cough ?Abdomen: Soft.  Tenderness now in the central to right abdomen without any guarding or rebound.  Bowel sounds are active.  No protuberance or distention ?Extremities: No CCE ?Neuro/Psych: Oriented x3.  Moves all 4 limbs.  No tremors. ? ?Intake/Output from previous day: ?05/16 0701 - 05/17 0700 ?In: 2797.6 [I.V.:2497.6; IV Piggyback:300] ?Out: -  ? ?Intake/Output this shift: ?No intake/output data recorded. ? ?Lab Results: ?Recent Labs  ?  12/25/21 ?0330 12/26/21 ?0981 12/27/21 ?0101  ?WBC 15.4* 17.1* 16.3*  ?HGB 13.1 13.2 12.5  ?HCT 38.6 37.5 36.4  ?PLT 216 209 217  ? ?BMET ?Recent Labs  ?  12/25/21 ?0330 12/26/21 ?1914 12/27/21 ?0101  ?NA 135 133* 134*  ?K 3.6 3.2* 3.3*  ?CL  102 103 102  ?CO2 19* 23 24  ?GLUCOSE 76 187* 97  ?BUN <5* <5* <5*  ?CREATININE 0.88 0.80 0.69  ?CALCIUM 8.4* 8.2* 7.9*  ? ?LFT ?Recent Labs  ?  12/26/21 ?1856 12/27/21 ?0101  ?PROT 6.0* 5.6*  ?ALBUMIN 2.2* 2.1*  ?AST 27 45*  ?ALT 16 28  ?ALKPHOS 78 73  ?BILITOT 1.0 0.5  ? ?PT/INR ?No results for input(s): LABPROT, INR in the last 72 hours. ?Hepatitis Panel ?No results for input(s): HEPBSAG, HCVAB, HEPAIGM, HEPBIGM in the last 72 hours. ? ?Studies/Results: ?DG UGI W SINGLE CM (SOL OR THIN BA) ? ?Result Date: 12/25/2021 ?CLINICAL  DATA:  Perforated gastric ulcer of posterior antrum, now with leukocytosis and fever EXAM: DG UGI W SINGLE CM TECHNIQUE: Scout radiograph was obtained. Single contrast examination was performed using thin water soluble contrast. This exam was performed by Pasty Spillers, PA-C, and was supervised and interpreted by P. Patel MD. FLUOROSCOPY TIME:  Radiation Exposure Index (as provided by the fluoroscopic device): 24.70mG y Ref. Air Kerma COMPARISON:  CT 5/15. FINDINGS: Scout radiograph demonstrates a normal bowel gas pattern. Contrast is swallowed without difficulty. Esophagus is grossly unremarkable. The stomach has an unremarkable single contrast appearance. There is no contrast extravasation to suggest perforation along the posterior wall. Limited evaluation for ulcer on this study. Visualized portions of the duodenum are unremarkable. IMPRESSION: No evidence for gastric perforation. Electronically Signed   By: Macy Mis M.D.   On: 12/25/2021 15:44  ? ?[place] EKG SITE RITE ? ?Result Date: 12/26/2021 ?If [employer] not attached, placement could not be confirmed due to current cardiac rhythm.  ? ?ASSESMENT:  ? ?Complex cystic lesion in pancreas though low suspicion for acute pancreatitis.  Deviously absolutely normal lipase and LFTs.  Today AST minimally elevated at 45 ? ?Concern for contained perforation gastric ulcer in setting of NSAIDs, prednisone.  Upper GI series for follow-up of prior CT with no evidence gastric perf. follow-up CT did show thickened gastric antrum.  Persistent right upper quadrant pain, fever despite several days of IV Protonix.  Considering EGD on 5/18 earliest to reduce risk of EGD associated perforation.  Fevers persist.  102.8 max at 8 AM this morning.  Blood cultures pending.  Leukocytosis improved to 17.1..  16.3 over 24 hours ? ? ?PLAN  ? ?EGD tomorrow?,  Dr. Bryan Lemma to follow and decide on timing of EGD. ? ? ? ?Azucena Freed  12/27/2021, 1:06 PM ?Phone [phone]  ?

## 2021-12-27 NOTE — Plan of Care (Signed)
?  Problem: Health Behavior/Discharge Planning: ?Goal: Ability to manage health-related needs will improve ?Outcome: Progressing ?  ?Problem: Clinical Measurements: ?Goal: Ability to maintain clinical measurements within normal limits will improve ?Outcome: Progressing ?Goal: Will remain free from infection ?Outcome: Progressing ?Goal: Diagnostic test results will improve ?Outcome: Progressing ?Goal: Respiratory complications will improve ?Outcome: Progressing ?  ?

## 2021-12-27 NOTE — Progress Notes (Signed)
? ? ?   ?Subjective: ?Looks much better today.  Awake and alert.  Says that her pain is about the same, but she is much more calm today.  Hungry and wants to eat.  Says carafate hasn't helped so far. ? ?Objective: ?Vital signs in last 24 hours: ?Temp:  [98.8 ?F (37.1 ?C)-102.8 ?F (39.3 ?C)] 102.8 ?F (39.3 ?C) (05/17 0630) ?Pulse Rate:  [95-110] 100 (05/17 0812) ?Resp:  [17-20] 17 (05/17 1601) ?BP: (107-133)/(59-89) 126/89 (05/17 0932) ?SpO2:  [91 %-94 %] 92 % (05/17 0812) ?Last BM Date : 12/21/21 ? ?Intake/Output from previous day: ?05/16 0701 - 05/17 0700 ?In: 2797.6 [I.V.:2497.6; IV Piggyback:300] ?Out: -  ?Intake/Output this shift: ?No intake/output data recorded. ? ?PE: ?Gen:  fatigued looking ?Abd: Soft, ND, patient seems relatively nontender on exam today.  Her pain previously has seemed somewhat out of proportion to her exam.  No peritonitis or guarding.  +BS ? ?Lab Results:  ?Recent Labs  ?  12/26/21 ?3557 12/27/21 ?0101  ?WBC 17.1* 16.3*  ?HGB 13.2 12.5  ?HCT 37.5 36.4  ?PLT 209 217  ? ?BMET ?Recent Labs  ?  12/26/21 ?3220 12/27/21 ?0101  ?NA 133* 134*  ?K 3.2* 3.3*  ?CL 103 102  ?CO2 23 24  ?GLUCOSE 187* 97  ?BUN <5* <5*  ?CREATININE 0.80 0.69  ?CALCIUM 8.2* 7.9*  ? ?PT/INR ?No results for input(s): LABPROT, INR in the last 72 hours. ?CMP  ?   ?Component Value Date/Time  ? NA 134 (L) 12/27/2021 0101  ? K 3.3 (L) 12/27/2021 0101  ? CL 102 12/27/2021 0101  ? CO2 24 12/27/2021 0101  ? GLUCOSE 97 12/27/2021 0101  ? BUN <5 (L) 12/27/2021 0101  ? CREATININE 0.69 12/27/2021 0101  ? CALCIUM 7.9 (L) 12/27/2021 0101  ? PROT 5.6 (L) 12/27/2021 0101  ? ALBUMIN 2.1 (L) 12/27/2021 0101  ? AST 45 (H) 12/27/2021 0101  ? ALT 28 12/27/2021 0101  ? ALKPHOS 73 12/27/2021 0101  ? BILITOT 0.5 12/27/2021 0101  ? GFRNONAA >60 12/27/2021 0101  ? GFRAA >60 10/19/2016 2000  ? ?Lipase  ?   ?Component Value Date/Time  ? LIPASE 24 12/26/2021 0558  ? ? ?Studies/Results: ?DG CHEST PORT 1 VIEW ? ?Result Date: 12/25/2021 ?CLINICAL DATA:   Fever and leukocytosis. EXAM: PORTABLE CHEST 1 VIEW COMPARISON:  CT chest and chest x-ray dated Dec 22, 2021. FINDINGS: The heart size and mediastinal contours are within normal limits. No focal consolidation, pleural effusion, or pneumothorax. No acute osseous abnormality. IMPRESSION: No active disease. Electronically Signed   By: Titus Dubin M.D.   On: 12/25/2021 09:57  ? ?DG UGI W SINGLE CM (SOL OR THIN BA) ? ?Result Date: 12/25/2021 ?CLINICAL DATA:  Perforated gastric ulcer of posterior antrum, now with leukocytosis and fever EXAM: DG UGI W SINGLE CM TECHNIQUE: Scout radiograph was obtained. Single contrast examination was performed using thin water soluble contrast. This exam was performed by Pasty Spillers, PA-C, and was supervised and interpreted by P. Patel MD. FLUOROSCOPY TIME:  Radiation Exposure Index (as provided by the fluoroscopic device): 24.70mG y Ref. Air Kerma COMPARISON:  CT 5/15. FINDINGS: Scout radiograph demonstrates a normal bowel gas pattern. Contrast is swallowed without difficulty. Esophagus is grossly unremarkable. The stomach has an unremarkable single contrast appearance. There is no contrast extravasation to suggest perforation along the posterior wall. Limited evaluation for ulcer on this study. Visualized portions of the duodenum are unremarkable. IMPRESSION: No evidence for gastric perforation. Electronically Signed   By: Malachi Carl  Patel M.D.   On: 12/25/2021 15:44  ? ?[place] EKG SITE RITE ? ?Result Date: 12/26/2021 ?If [employer] not attached, placement could not be confirmed due to current cardiac rhythm.  ? ?Anti-infectives: ?Anti-infectives (From admission, onward)  ? ? Start     Dose/Rate Route Frequency Ordered Stop  ? 12/25/21 1600  metroNIDAZOLE (FLAGYL) IVPB 500 mg  Status:  Discontinued       ? 500 mg ?100 mL/hr over 60 Minutes Intravenous Every 12 hours 12/25/21 0748 12/25/21 0842  ? 12/25/21 1400  ceFEPIme (MAXIPIME) 2 g in sodium chloride 0.9 % 100 mL IVPB  Status:   Discontinued       ? 2 g ?200 mL/hr over 30 Minutes Intravenous Every 8 hours 12/25/21 0748 12/25/21 0841  ? 12/25/21 0930  meropenem (MERREM) 2 g in sodium chloride 0.9 % 100 mL IVPB       ? 2 g ?280 mL/hr over 30 Minutes Intravenous Every 8 hours 12/25/21 0842    ? 12/22/21 2300  ceFEPIme (MAXIPIME) 2 g in sodium chloride 0.9 % 100 mL IVPB  Status:  Discontinued       ? 2 g ?200 mL/hr over 30 Minutes Intravenous Every 8 hours 12/22/21 1440 12/25/21 0720  ? 12/22/21 1445  ceFEPIme (MAXIPIME) 2 g in sodium chloride 0.9 % 100 mL IVPB       ? 2 g ?200 mL/hr over 30 Minutes Intravenous  Once 12/22/21 1436 12/22/21 1745  ? 12/22/21 1445  metroNIDAZOLE (FLAGYL) IVPB 500 mg  Status:  Discontinued       ? 500 mg ?100 mL/hr over 60 Minutes Intravenous Every 12 hours 12/22/21 1436 12/25/21 0721  ? ?  ? ? ? ?Assessment/Plan ?Posterior contained perforated gastric ulcer and pancreatic lesion ?- CT w/ 3.3 x 2.7 x 2.6 cm complex multicystic lesion along the cranial ?margin of the pancreatic body extending up adjacent to the gastric fundus inferior to the esophagogastric junction. GI and our team has reviewed and felt 2/2 posterior contained perforated gastric ulcer. No current indication for surgical intervention.  ?- High risk w/ longstanding hx of NSAID use (50 BC powders per week) and has been on prednisone for the past 6 to 8 weeks ?- H. Pylori pending ?- PPI gtt, IV abx ?- continues to be febrile to 102.8 today.  This is generally not the type of fever someone runs with an intra-abdominal infectious source. ?-CT scan yesterday remains stable with no evidence of infection, but inflammation around the stomach and pancreas.  Lipase normal.   ?-UGI negative for gastric perforation ?-doubt this is etiology of fevers at this point as no evidence for infection on imaging. ?-GI to determine on EGD.  Carafate seems to not have helped thus far ?-blood culture and urine culture negative thus far ?-tolerating clears, wants solid food.   No issues with this surgically ?-patient seems to wax and wane daily with her symptoms.  Fevers still persist.  Imaging is unremarkable really in determining etiology of her symptoms.  Her abdomen is soft and she really is not tender on exam today for me, despite her still saying she is having pain. ?  ?FEN - CLD, may adv from our standpoint, IVF per primary ?VTE - SCDs, subq heparin ?ID - Cefepime/Flagyl, changed to merrem 5/15 ?  ?- Per primary -  ?DM ?HTN ?HLD ?12 mm cavitary nodule identified right lower lobe ?R adnexal cyst ? ?I have reviewed 24 hours of vitals, labs, imaging (personally  and independently reviewed and interpreted CT Scan of abdomen/pelvis), as well as nursing and medicine notes. ? ? LOS: 5 days  ? ? ?Henreitta Cea , PA-C ?[facility] ?12/27/2021, 9:29 AM ?Please see Amion for pager number during day hours 7:00am-4:30pm ? ?

## 2021-12-27 NOTE — Progress Notes (Signed)
?  Echocardiogram ?2D Echocardiogram has been performed. ? ?Crystal Mora  [name] ?12/27/2021, 2:31 PM ?

## 2021-12-27 NOTE — Progress Notes (Signed)
Pharmacy Antibiotic Note ? ?Crystal Mora is a 46 y.o. female admitted on 12/22/2021 with  perforated gastric ulcer and concern for intra-abdominal infection .  Patient is on day # 4 of Cefepime and Flagyl with increased WBC and new fever.  Pharmacy has been consulted to change antibiotics to Meropenem. ? ?ID is adding vanc for thrombophlebititis/fever. Renal function remains stable.  ? ?Plan: ?Vanc 1g IV q12>>AUC 499, scr 0.8 ?Meropenem 2gm IV q8h ?Levels as needed ? ?Height: 5\' 6"  (167.6 cm) ?Weight: 74.8 kg (164 lb 14.5 oz) ?IBW/kg (Calculated) : 59.3 ? ?Temp (24hrs), Avg:100.1 ?F (37.8 ?C), Min:98.3 ?F (36.8 ?C), Max:102.8 ?F (39.3 ?C) ? ?Recent Labs  ?Lab 12/22/21 ?1525 12/22/21 ?2301 12/23/21 ?1062 12/24/21 ?6948 12/25/21 ?0330 12/26/21 ?5462 12/27/21 ?0101  ?WBC  --   --  12.0* 10.7* 15.4* 17.1* 16.3*  ?CREATININE  --   --  0.80 0.74 0.88 0.80 0.69  ?LATICACIDVEN 0.7 0.5  --   --   --   --   --   ? ?  ?Estimated Creatinine Clearance: 92.8 mL/min (by C-G formula based on SCr of 0.69 mg/dL).   ? ?Allergies  ?Allergen Reactions  ? Biaxin [Clarithromycin]   ? Morphine And Related Hives  ?  Says it is okay with benadryl administration  ? Penicillins Hives, Itching and Swelling  ?  Has patient had a PCN reaction causing immediate rash, facial/tongue/throat swelling, SOB or lightheadedness with hypotension: No ?Has patient had a PCN reaction causing severe rash involving mucus membranes or skin necrosis: No ?Has patient had a PCN reaction that required hospitalization No ?Has patient had a PCN reaction occurring within the last 10 years: Yes ?If all of the above answers are "NO", then may proceed with Cephalosporin use. ?  ? Percocet [Oxycodone-Acetaminophen] Hives and Itching  ? Shrimp [Shellfish Allergy]   ? Citalopram Anxiety  ?  aggression  ? Topiramate Rash  ? ? ?Antimicrobials this admission: ?Cefepime 5/12 >> 5/15 ?Flagyl 5/12 >> 5/15 ?Merrem 5/15>> ?Vanc 5/17>> ?Dose adjustments this admission: ? ? ?5/15  UCx- sent ?5/15 BCx- ng x2 day ?5/12 Bcx- ng x5 days ? ?Onnie Boer, PharmD, BCIDP, AAHIVP, CPP ?Infectious Disease Pharmacist ?12/27/2021 6:09 PM ? ? ? ? ?

## 2021-12-27 NOTE — Progress Notes (Signed)
FPTS Brief Progress Note  S:Patient laying in bed on the phone when I entered the room. She expresses some frustration in not knowing what exactly is going on and why she continues to have fevers and would rather be at home. Still endorses some abdominal discomfort.   O: BP 115/81 (BP Location: Right Arm)   Pulse 90   Temp 98.3 F (36.8 C) (Oral)   Resp 17   Ht 5\' 6"  (1.676 m)   Wt 74.8 kg   SpO2 99%   BMI 26.62 kg/m   Physical exam General: well appearing, NAD Cardiovascular: RRR, no murmurs Lungs: CTAB. Normal WOB Abdomen: soft, non-distended, TTP in LUQ Skin: warm, dry. No edema   A/P: Abdominal pain, unexplained fever  Pulm consulted today for RLL cavitary nodule on CT  ID added vanc today for throbophlebitis/ fever  Suspected contained perforated gastric ulcer thought not to be the cause of fevers  Lumbar XR today negative for acute changes  - f/u pulm recs and ID recs - continue Meropenem and Vanc  - monitor fever curve - f/u blood cultures - f/u upper and lower extremity dopplers   - Orders reviewed. Labs for AM ordered, which was adjusted as needed.   Plan for other conditions per day team's note  Shary Key, DO 12/27/2021, 9:14 PM PGY-2, [facility] Night Resident  Please page [phone] with questions.

## 2021-12-28 ENCOUNTER — Other Ambulatory Visit (HOSPITAL_COMMUNITY): Payer: Self-pay

## 2021-12-28 ENCOUNTER — Inpatient Hospital Stay (HOSPITAL_COMMUNITY): Payer: Medicare Other

## 2021-12-28 ENCOUNTER — Telehealth: Payer: Self-pay

## 2021-12-28 DIAGNOSIS — R509 Fever, unspecified: Secondary | ICD-10-CM

## 2021-12-28 DIAGNOSIS — K862 Cyst of pancreas: Secondary | ICD-10-CM

## 2021-12-28 DIAGNOSIS — D72829 Elevated white blood cell count, unspecified: Secondary | ICD-10-CM

## 2021-12-28 LAB — URINE CULTURE: Culture: <10000 — AB

## 2021-12-28 LAB — CBC WITH DIFFERENTIAL/PLATELET
Abs Immature Granulocytes: 0.06 10*3/uL (ref 0.00–0.07)
Basophils Absolute: 0 10*3/uL (ref 0.0–0.1)
Basophils Relative: 0 %
Eosinophils Absolute: 0.1 10*3/uL (ref 0.0–0.5)
Eosinophils Relative: 1 %
HCT: 36.1 % (ref 36.0–46.0)
Hemoglobin: 12 g/dL (ref 12.0–15.0)
Immature Granulocytes: 1 %
Lymphocytes Relative: 12 %
Lymphs Abs: 1.3 10*3/uL (ref 0.7–4.0)
MCH: 28.8 pg (ref 26.0–34.0)
MCHC: 33.2 g/dL (ref 30.0–36.0)
MCV: 86.8 fL (ref 80.0–100.0)
Monocytes Absolute: 0.6 10*3/uL (ref 0.1–1.0)
Monocytes Relative: 5 %
Neutro Abs: 8.9 10*3/uL — ABNORMAL HIGH (ref 1.7–7.7)
Neutrophils Relative %: 81 %
Platelets: 238 10*3/uL (ref 150–400)
RBC: 4.16 MIL/uL (ref 3.87–5.11)
RDW: 14.9 % (ref 11.5–15.5)
WBC: 10.8 10*3/uL — ABNORMAL HIGH (ref 4.0–10.5)
nRBC: 0 % (ref 0.0–0.2)

## 2021-12-28 LAB — QUANTIFERON-TB GOLD PLUS (RQFGPL)
QuantiFERON Mitogen Value: >10 IU/mL
QuantiFERON Nil Value: 0.08 IU/mL
QuantiFERON TB1 Ag Value: 0.08 IU/mL
QuantiFERON TB2 Ag Value: 0.07 IU/mL

## 2021-12-28 LAB — BASIC METABOLIC PANEL
Anion gap: 9 (ref 5–15)
BUN: <5 mg/dL — ABNORMAL LOW (ref 6–20)
CO2: 24 mmol/L (ref 22–32)
Calcium: 8.1 mg/dL — ABNORMAL LOW (ref 8.9–10.3)
Chloride: 105 mmol/L (ref 98–111)
Creatinine, Ser: 0.64 mg/dL (ref 0.44–1.00)
GFR, Estimated: >60 mL/min (ref >60)
Glucose, Bld: 83 mg/dL (ref 70–99)
Potassium: 3.6 mmol/L (ref 3.5–5.1)
Sodium: 138 mmol/L (ref 135–145)

## 2021-12-28 LAB — QUANTIFERON-TB GOLD PLUS: QuantiFERON-TB Gold Plus: NEGATIVE

## 2021-12-28 LAB — SEDIMENTATION RATE: Sed Rate: 89 mm/hr — ABNORMAL HIGH (ref 0–22)

## 2021-12-28 LAB — PROCALCITONIN: Procalcitonin: 137.19 ng/mL

## 2021-12-28 LAB — C-REACTIVE PROTEIN: CRP: 37 mg/dL — ABNORMAL HIGH (ref <1.0)

## 2021-12-28 MED ORDER — CEPHALEXIN 500 MG PO CAPS: 500.0000 mg | ORAL_CAPSULE | Freq: Two times a day (BID) | ORAL | 0 refills | Status: DC

## 2021-12-28 MED ORDER — PANTOPRAZOLE SODIUM 20 MG PO TBEC: 20.0000 mg | DELAYED_RELEASE_TABLET | Freq: Every day | ORAL | 1 refills | Status: DC

## 2021-12-28 MED ORDER — CEPHALEXIN 500 MG PO CAPS
500.0000 mg | ORAL_CAPSULE | Freq: Two times a day (BID) | ORAL | 0 refills | Status: DC
  Filled 2021-12-28: qty 20, 10d supply, fill #0

## 2021-12-28 MED ORDER — LINEZOLID 600 MG PO TABS
600.0000 mg | ORAL_TABLET | Freq: Two times a day (BID) | ORAL | 0 refills | Status: DC
  Filled 2021-12-28: qty 20, 10d supply, fill #0

## 2021-12-28 MED ORDER — LINEZOLID 600 MG PO TABS: 600.0000 mg | ORAL_TABLET | Freq: Two times a day (BID) | ORAL | 0 refills | Status: DC

## 2021-12-28 MED ORDER — SUCRALFATE 1 GM/10ML PO SUSP: 1.0000 g | Freq: Three times a day (TID) | ORAL | 1 refills | Status: DC

## 2021-12-28 NOTE — Telephone Encounter (Signed)
Patient has been scheduled for a 5-week hospital follow up with Dr. Ardis Hughs on Friday, 02/02/22 at 3 pm.  Appt information sent to patient via [place] and mailed.

## 2021-12-28 NOTE — Progress Notes (Addendum)
Called Crystal Mora via phone and she verified her date of birth and full name before proceeding with phone conversation. Advised patient she should discontinue use of BC powder, Celebrex and Aspirin due to her history of gastric ulcer and increased risk of gastric perforation. Reviews risk of gastric perforations and signs to look out for. Patient verbalized understanding and she was agreeable to plan.

## 2021-12-28 NOTE — Discharge Summary (Addendum)
FMTS Attending Discharge Note: Crystal Singh, MD  Team Pager [phone] Pager [phone]  I have seen and examined this patient, reviewed their chart. I have discussed this patient with the resident physician.  Addendums to below note include:  Potential peptic ulcer disease, will need outpatient EGD as well as likely endoscopic ultrasound for pancreatic lesion.  Cavitary lesion, will need outpatient CT in 8 to 12 weeks.  Right ovarian cyst recommend ultrasound in 3 to 6 months.  Patient not discharged on BC powder, celebrex, and aspirin see follow up note.    I agree with the remainder of the findings, exam, and plan below.    [facility] Discharge Summary  Patient name: Crystal Mora Medical record number: [id number] Date of birth: 11-20-1975 Age: 46 y.o. Gender: female Date of Admission: 12/22/2021  Date of AMA: 12/28/21 Admitting Physician: Lind Covert, MD  Primary Care Provider: [facility] Consultants: General surg, GI and ID  Indication for Hospitalization:  Fever- uncertain cause Pancreatic mass, also uncertain cause  Discharge Diagnoses/Problem List:  Abdominal Tenderness Fever  Hypokalemia  LLL cavitary lung lesion Hypertension R ovarian cyst  Superficial thrombophlebitis    Disposition: Left AMA, Home  Discharge Condition: Hemodynamically stable, still fevering and pain improved  Discharge Exam:  General: Alert, well appearing, NAD HEENT: Atraumatic, MMM, No sclera icterus CV: RRR, no murmurs, normal S1/S2 Pulm: CTAB, good WOB on RA, no crackles or wheezing Abd: Soft, no distension, no tenderness Skin:No visible skin lesion Ext: No BLE edema,   Brief Hospital Course:  Crystal Mora is a 46 y.o. female who was admitted for abdominal pain thought to be posterior perforated gastric ulcer secondary to chronic NSAID abuse and steroid use.  She has a past medical history significant for  bipolar depression, hypertension, hyperlipidemia, chronic back pain, hidradenitis suppurativa, anxiety.  Hospital course is listed by problem, please refer to the H&P for additional information.  Antibiotic summary: 5/12 - 5/15 metronidazole 500 mg q 12 hours 5/12 - 5/15 cefepime 2g q 8 hours 5/15 - 5/18 AM meropenem 2g q 8 hours 5/17 - 5/18 AM vancomycin 5/18 left AMA  Abdominal pain  unexplained fever Work-up in the ED notable for mild leukocytosis at 16.4. 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 Surgery was consulted who recommend no surgical intervention necessary given barium swallow finding of no gastric extravasation making gastric perforation less likely. On hospital day 3, patient was started on broad antibiotics of metronidazole and cefepime due to new onset fever with abdominal pain.  Antibiotics was eventually transitioned to IV meropenem per ID for abdominal coverage. GI was consulted due to concerns of pancreatic etiology given patient's CT finding of multicystic lesions of the pancreas and continuous abdominal pain with fever.  Her fever curve improved on IV meropenem however patient continued to fever despite IV antibiotics and scheduled IV tylenol with no clear source of infection.  Echocardiogram, CXR and lumbar XR (for back pain) were unremarkable and thorough skin exam showed no lesions. Given elevated inflammatory markers and persistent fever, ID recommended UE and LE Doppler and starting patient on Vancomycin due to concerns for possible thrombophlebitis.  Upper extremity Doppler ultrasound showed a superficial thrombus in the right cephalic vein.  This does not require anticoagulation but could be the cause of her fevers.  She was afebrile on the 18th and pain was much  improved but she left the hospital [place] for family responsibilities.  Dr. Adah Salvage  and myself discussed at length the risks of leaving Crystal Mora and premature discharge.  We discussed the risk of progression of infection, severe abdominal pain and death.  The patient was able to voice understanding of these consequences and complications.  She knows that her infection could result in significant harm including death.  Despite this the patient elected to for a patient directed discharge.  Per ID recommendation linezolid and cephalexin was sent to her home pharmacy which patient was advised to take for 10 days and also continue PPI and Carafate.  Patient was called via phone and informed her medication was sent to her home pharmacy and instructed on medication administration. Return precautions were reviewed and patient verbalized understanding. GI recommends outpatient EUS for pancreatic lesion and gastric evaluation.  Cavitary lung lesion 12 mm cavitary nodule seen in right lower lobe on CTA chest.  This was thought to be infectious (TB) versus inflammatory VS malignancy. Less likely TB given location of the lesion and no preceding history or increased risk. Low suspicion for malignancy  given patient's age and remote tobacco use history.  Radiologist recommended follow-up CT chest in 3 months as an outpatient.  Quant gold was collected during hospitalization and pending prior to patient leaving AMA. Pulmonology consulted, but declined sampling nodule given high risk and low expected benefit. Expected result from [facility] 5/20. Patient is recommended to follow up with outpatient pulmonologist for further assessment.  PCP follow up Issues: Continue to counsel on strict NSAID avoidance and strongly recommend avoiding prescription of prednisone for any reason. Consider TSH check given hx partial thyroidectomy  Incidental 4.2 cm diameter RIGHT ovarian cyst found on CT; follow-up ultrasound recommended in 3-6 months. CT chest recommended in 3 months to follow up on cavitary lung  lesion in RLL. Need outpatient pulmonologist. PCP to follow up on quantiferon gold TB test collected inpatient (expected result date from [facility] 5/20). Per GI, Eventually plan for outpatient MRI pancreas protocol with subsequent EUS findings Patient will also need an outpatient endoscopy.  Significant Procedures:  Barium swallow   Significant Labs and Imaging:  Recent Labs  Lab 12/26/21 0558 12/27/21 0101 12/28/21 0109  WBC 17.1* 16.3* 10.8*  HGB 13.2 12.5 12.0  HCT 37.5 36.4 36.1  PLT 209 217 238   Recent Labs  Lab 12/22/21 1020 12/23/21 0233 12/24/21 0307 12/25/21 0330 12/26/21 0558 12/27/21 0101 12/28/21 0109  NA 139   < > 136 135 133* 134* 138  K 3.5   < > 3.1* 3.6 3.2* 3.3* 3.6  CL 104   < > 101 102 103 102 105  CO2 26   < > 24 19* 23 24 24   GLUCOSE 119*   < > 87 76 187* 97 83  BUN 9   < > 6 <5* <5* <5* <5*  CREATININE 0.84   < > 0.74 0.88 0.80 0.69 0.64  CALCIUM 8.5*   < > 8.2* 8.4* 8.2* 7.9* 8.1*  MG  --   --  1.9  --   --   --   --   ALKPHOS 68  --   --   --  78 73  --   AST 12*  --   --   --  27 45*  --   ALT 13  --   --   --  16 28  --   ALBUMIN 3.1*  --   --   --  2.2* 2.1*  --    < > = values in this interval not displayed.    Results/Tests Pending at Time of Discharge: Quant lab  Discharge Medications:  Allergies as of 12/28/2021       Reactions   Biaxin [clarithromycin]    Morphine And Related Hives   Says it is okay with benadryl administration   Penicillins Hives, Itching, Swelling   Has patient had a PCN reaction causing immediate rash, facial/tongue/throat swelling, SOB or lightheadedness with hypotension: No Has patient had a PCN reaction causing severe rash involving mucus membranes or skin necrosis: No Has patient had a PCN reaction that required hospitalization No Has patient had a PCN reaction occurring within the last 10 years: Yes If all of the above answers are "NO", then may proceed with Cephalosporin use.   Percocet  [oxycodone-acetaminophen] Hives, Itching   Shrimp [shellfish Allergy]    Citalopram Anxiety   aggression   Topiramate Rash        Medication List     STOP taking these medications    fluconazole 200 MG tablet Commonly known as: DIFLUCAN       TAKE these medications    amLODipine 2.5 MG tablet Commonly known as: NORVASC Take 2.5 mg by mouth daily.   aspirin EC 81 MG tablet Take 81 mg by mouth daily.   BC HEADACHE POWDER PO Take 1 Package by mouth daily as needed (headache).   celecoxib 100 MG capsule Commonly known as: CELEBREX Take 100 mg by mouth daily.   cephALEXin 500 MG capsule Commonly known as: KEFLEX Take 1 capsule (500 mg total) by mouth 2 (two) times daily for 10 days.   cetirizine 10 MG tablet Commonly known as: ZYRTEC Take 10 mg by mouth daily.   cyclobenzaprine 10 MG tablet Commonly known as: FLEXERIL Take 1 tablet (10 mg total) by mouth every 8 (eight) hours as needed for muscle spasms.   diazepam 5 MG tablet Commonly known as: VALIUM Take 5 mg by mouth in the morning, at noon, and at bedtime.   EPINEPHrine 0.3 mg/0.3 mL Soaj injection Commonly known as: EpiPen 2-Pak Inject 0.3 mLs (0.3 mg total) into the muscle once as needed for up to 1 dose (for severe allergic reaction). CAll 911 immediately if you have to use this medicine   estradiol 0.05 mg/24hr patch Commonly known as: CLIMARA - Dosed in mg/24 hr Place 0.05 mg onto the skin once a week.   famotidine 20 MG tablet Commonly known as: Pepcid Take 1 tablet (20 mg total) by mouth 2 (two) times daily. What changed:  when to take this reasons to take this   gabapentin 300 MG capsule Commonly known as: NEURONTIN Take 300 mg by mouth daily as needed (For feet pain).   Humira Pen 80 MG/0.8ML Pnkt Generic drug: Adalimumab Inject 80 mg into the skin every 14 (fourteen) days.   hydroxychloroquine 200 MG tablet Commonly known as: PLAQUENIL Take 200 mg by mouth daily.   hydrOXYzine  25 MG tablet Commonly known as: ATARAX Take 1 every 4-6 hours as needed for itch or hives. (Caution: May cause drowsiness) What changed:  how much to take how to take this when to take this reasons to take this additional instructions   lidocaine 5 % Commonly known as: Lidoderm Place 1 patch onto the skin daily. Remove & Discard patch within 12 hours or as directed by MD   linezolid 600 MG tablet Commonly known as: ZYVOX Take 1 tablet (600  mg total) by mouth 2 (two) times daily for 10 days.   losartan 100 MG tablet Commonly known as: COZAAR Take 1 tablet by mouth daily.   metFORMIN 500 MG tablet Commonly known as: GLUCOPHAGE Take 500 mg by mouth 2 (two) times daily with a meal.   methocarbamol 500 MG tablet Commonly known as: ROBAXIN Take 1 tablet (500 mg total) by mouth 2 (two) times daily. What changed:  when to take this reasons to take this   mirtazapine 15 MG tablet Commonly known as: REMERON Take 15 mg by mouth at bedtime.   omalizumab 150 MG/ML prefilled syringe Commonly known as: XOLAIR Inject 150 mg into the skin every 14 (fourteen) days.   pantoprazole 20 MG tablet Commonly known as: PROTONIX Take 1 tablet (20 mg total) by mouth daily.   progesterone 100 MG capsule Commonly known as: PROMETRIUM Take 100 mg by mouth daily.   Restasis 0.05 % ophthalmic emulsion Generic drug: cycloSPORINE Place 1 drop into both eyes daily as needed for dry eyes.   rosuvastatin 5 MG tablet Commonly known as: CRESTOR Take 1 tablet by mouth daily.   spironolactone 50 MG tablet Commonly known as: ALDACTONE Take 50 mg by mouth daily.   sucralfate 1 GM/10ML suspension Commonly known as: CARAFATE Take 10 mLs (1 g total) by mouth 4 (four) times daily -  with meals and at bedtime.        Discharge Instructions: Please refer to Patient Instructions section of EMR for full details.  Patient was counseled important signs and symptoms that should prompt return to medical  care, changes in medications, dietary instructions, activity restrictions, and follow up appointments.   Follow-Up Appointments:  Follow-up Information     [facility]. Schedule an appointment as soon as possible for a visit in 1 week(s).   Specialty: Family Medicine Why: Please make a hospital follow up with your primay care doctor. Should be about one week from hospital discharge. Contact information: [place] [address] [phone]         [place]. Go to.   Specialty: Emergency Medicine Why: If symptoms worsen, As needed Contact information: [address] [id number] [place]                Alen Bleacher, MD 12/28/2021, 2:51 PM PGY-1, [place]

## 2021-12-28 NOTE — Progress Notes (Signed)
0.5 mg Dilaudid returned to pharmacy by Fransisca Kaufmann RN. This RN does not have Pyxis access and was unable to pull or return this medication. This RN walked the unopened/sealed medication to the pharmacy.

## 2021-12-28 NOTE — Progress Notes (Addendum)
Attending physician's note   I have reviewed Crystal chart and discussed her care on rounds. I performed a substantive portion of this encounter, including complete performance of at least one of Crystal key components, in conjunction with Crystal APP. I agree with Crystal APP's note, impression, and recommendations with my edits.   Pain improving on current therapy.  Tolerating p.o. intake.  Hoping to go home today.  Afebrile overnight with Tmax 102.8 yesterday afternoon.  PCT downtrending and leukocytosis resolved.  - Continue high dose PPI and Carafate as outlined - Plan for outpatient MRI Pancreas protocol. Will discuss role/utility of EUS as outpatient based on those findings - No NSAIDs - We will arrange for follow-up in [facility] GI clinic.  Will eventually need endoscopic evaluation of atrial wall thickening and PUD.  Can likely plan for EGD in 6-8 weeks to document healing ulcer and evaluate for Helicobacter pylori and other etiology -GI service will sign off at this time  Gerrit Heck, DO, [facility] [phone] office         Progress Note   Subjective  Chief Complaint:Suspicion for perforated gastric ulcer, pancreatic cyst lesion  Today, Crystal Mora is sitting up on Crystal side of her bed all dressed and tells me she is going home because her daughters prom is on Saturday and she has a lot of things that need to get done. She is a little aggravated that she has been here almost a week and " no one knows what's going on".  Tells me she feels well, has been having normal BM, no nausea/vomiting and abdominal pain is controlled. She is eating well, fries yesterday and potatoes with other things this morning.  Eager to leave.   Objective   Vital signs in last 24 hours: Temp:  [98.3 F (36.8 C)-102.7 F (39.3 C)] 98.8 F (37.1 C) (05/18 0804) Pulse Rate:  [90-100] 96 (05/18 0804) Resp:  [17-19] 19 (05/18 0804) BP: (115-143)/(74-97) 126/85 (05/18 0804) SpO2:  [95 %-100 %] 97 % (05/18 0804) Last  BM Date : 12/26/21 General:  AA female in NAD Heart:  Regular rate and rhythm; no murmurs Lungs: Respirations even and unlabored, lungs CTA bilaterally Abdomen:  Soft, nontender and nondistended. Normal bowel sounds.. Psych:  Cooperative. Normal mood and affect.  Intake/Output from previous day: 05/17 0701 - 05/18 0700 In: 2996.2 [P.O.:1080; I.V.:1316.2; IV Piggyback:600] Out: -    Lab Results: Recent Labs    12/26/21 0558 12/27/21 0101 12/28/21 0109  WBC 17.1* 16.3* 10.8*  HGB 13.2 12.5 12.0  HCT 37.5 36.4 36.1  PLT 209 217 238   BMET Recent Labs    12/26/21 0558 12/27/21 0101 12/28/21 0109  NA 133* 134* 138  K 3.2* 3.3* 3.6  CL 103 102 105  CO2 23 24 24   GLUCOSE 187* 97 83  BUN <5* <5* <5*  CREATININE 0.80 0.69 0.64  CALCIUM 8.2* 7.9* 8.1*   LFT Recent Labs    12/27/21 0101  PROT 5.6*  ALBUMIN 2.1*  AST 45*  ALT 28  ALKPHOS 73  BILITOT 0.5    Studies/Results: DG Lumbar Spine 1 View  Result Date: 12/27/2021 CLINICAL DATA:  Low back pain for many years. No injury. History of sciatic nerve pain. EXAM: LUMBAR SPINE - 1 VIEW COMPARISON:  None Available. FINDINGS: Single AP view of Crystal lumbar spine. Vertebral body heights are maintained. Sacroiliac joints are unremarkable. IMPRESSION: No acute abnormality on single AP view of Crystal lumbar spine. Electronically Signed  By: Keane Police D.O.   On: 12/27/2021 15:12   ECHOCARDIOGRAM COMPLETE  Result Date: 12/27/2021    ECHOCARDIOGRAM REPORT   Mora Name:   Crystal Mora Date of Exam: 12/27/2021 Medical Rec #:  [id number]        Height:       66.0 in Accession #:    [id number]       Weight:       164.9 lb Date of Birth:  October 11, 1975        BSA:          1.842 m Mora Age:    46 years         BP:           143/97 mmHg Mora Gender: F                HR:           101 bpm. Exam Location:  Inpatient Procedure: 2D Echo, Cardiac Doppler and Color Doppler Indications:    Murmur  History:        Mora has no prior  history of Echocardiogram examinations.                 Risk Factors:Hypertension, Diabetes and HLD.  Sonographer:    Joette Catching RCS Referring Phys: [place]  1. Left ventricular ejection fraction, by estimation, is 60 to 65%. Crystal left ventricle has normal function. Crystal left ventricle has no regional wall motion abnormalities. Left ventricular diastolic parameters were normal.  2. Right ventricular systolic function is normal. Crystal right ventricular size is normal. There is normal pulmonary artery systolic pressure.  3. Crystal mitral valve is normal in structure. No evidence of mitral valve regurgitation. No evidence of mitral stenosis.  4. Crystal aortic valve is tricuspid. Aortic valve regurgitation is not visualized. No aortic stenosis is present.  5. Crystal inferior vena cava is normal in size with greater than 50% respiratory variability, suggesting right atrial pressure of 3 mmHg.  6. Cannot exclude a small PFO. Comparison(s): No prior Echocardiogram. Conclusion(s)/Recommendation(s): Normal biventricular function without evidence of hemodynamically significant valvular heart disease. FINDINGS  Left Ventricle: Left ventricular ejection fraction, by estimation, is 60 to 65%. Crystal left ventricle has normal function. Crystal left ventricle has no regional wall motion abnormalities. Crystal left ventricular internal cavity size was normal in size. There is  no left ventricular hypertrophy. Left ventricular diastolic parameters were normal. Right Ventricle: Crystal right ventricular size is normal. No increase in right ventricular wall thickness. Right ventricular systolic function is normal. There is normal pulmonary artery systolic pressure. Crystal tricuspid regurgitant velocity is 2.18 m/s, and  with an assumed right atrial pressure of 3 mmHg, Crystal estimated right ventricular systolic pressure is 61.6 mmHg. Left Atrium: Left atrial size was normal in size. Right Atrium: Right atrial size was normal in size.  Pericardium: Trivial pericardial effusion is present. Mitral Valve: Crystal mitral valve is normal in structure. No evidence of mitral valve regurgitation. No evidence of mitral valve stenosis. Tricuspid Valve: Crystal tricuspid valve is normal in structure. Tricuspid valve regurgitation is trivial. No evidence of tricuspid stenosis. Aortic Valve: Crystal aortic valve is tricuspid. Aortic valve regurgitation is not visualized. No aortic stenosis is present. Aortic valve mean gradient measures 6.0 mmHg. Aortic valve peak gradient measures 11.2 mmHg. Aortic valve area, by VTI measures 2.48  cm. Pulmonic Valve: Crystal pulmonic valve was grossly normal. Pulmonic valve regurgitation is not visualized. No evidence of  pulmonic stenosis. Aorta: Crystal aortic root, ascending aorta, aortic arch and descending aorta are all structurally normal, with no evidence of dilitation or obstruction. Venous: Crystal inferior vena cava is normal in size with greater than 50% respiratory variability, suggesting right atrial pressure of 3 mmHg. IAS/Shunts: Cannot exclude a small PFO.  LEFT VENTRICLE PLAX 2D LVIDd:         4.90 cm   Diastology LVIDs:         3.40 cm   LV e' medial:    9.46 cm/s LV PW:         1.00 cm   LV E/e' medial:  9.9 LV IVS:        1.00 cm   LV e' lateral:   11.90 cm/s LVOT diam:     2.00 cm   LV E/e' lateral: 7.9 LV SV:         67 LV SV Index:   36 LVOT Area:     3.14 cm  RIGHT VENTRICLE             IVC RV Basal diam:  1.70 cm     IVC diam: 1.60 cm RV Mid diam:    1.50 cm RV S prime:     14.50 cm/s TAPSE (M-mode): 2.0 cm LEFT ATRIUM             Index        RIGHT ATRIUM          Index LA diam:        3.10 cm 1.68 cm/m   RA Area:     6.91 cm LA Vol (A2C):   33.8 ml 18.35 ml/m  RA Volume:   8.08 ml  4.39 ml/m LA Vol (A4C):   37.7 ml 20.46 ml/m LA Biplane Vol: 38.1 ml 20.68 ml/m  AORTIC VALVE                     PULMONIC VALVE AV Area (Vmax):    2.18 cm      PV Vmax:       1.13 m/s AV Area (Vmean):   2.17 cm      PV Peak grad:  5.1  mmHg AV Area (VTI):     2.48 cm AV Vmax:           167.00 cm/s AV Vmean:          120.000 cm/s AV VTI:            0.269 m AV Peak Grad:      11.2 mmHg AV Mean Grad:      6.0 mmHg LVOT Vmax:         116.00 cm/s LVOT Vmean:        82.900 cm/s LVOT VTI:          0.212 m LVOT/AV VTI ratio: 0.79  AORTA Ao Root diam: 3.00 cm Ao Asc diam:  3.00 cm MITRAL VALVE               TRICUSPID VALVE MV Area (PHT): 7.51 cm    TR Peak grad:   19.0 mmHg MV Decel Time: 101 msec    TR Vmax:        218.00 cm/s MV E velocity: 94.10 cm/s MV A velocity: 69.40 cm/s  SHUNTS MV E/A ratio:  1.36        Systemic VTI:  0.21 m  Systemic Diam: 2.00 cm Buford Dresser MD Electronically signed by Buford Dresser MD Signature Date/Time: 12/27/2021/9:14:42 PM    Final    [place] EKG SITE RITE  Result Date: 12/26/2021 If Site Rite image not attached, placement could not be confirmed due to current cardiac rhythm.     Assessment / Plan:   Assessment: Complex pancreatic cyst lesion of pancrease: normal lipase and LFT's, pain controlled, no nausea/vomiting Concern for perforated gastric ulcer: in setting of NSAID use, Prednsione, UGI series with no evidence of perforation, doing well today Fever of unknown origin: ID following-Mora thinks this happens at night when she is wrapped in warm blankets  Plan: [place] with Mora discharge home today from GI standpoint Continue Pantoprazole 40mg  PO BID at discharge Continue Carafate QID at discharge Will arrange for follow up at our clinic for further discussion of EUS and follow up in Crystal next 3-5 weeks. Mora tells me Hydrocodone doesn't work for her and that is why she was using [employer]- will need to find a different pain med she can go home with to prevent return to ED  Thank you for your kind consultation, we will sign off.   LOS: 6 days   Levin Erp  12/28/2021, 10:00 AM

## 2021-12-28 NOTE — Telephone Encounter (Signed)
-----   Message from Levin Erp, [place] sent at 12/28/2021 10:10 AM EDT ----- Regarding: Follow up Patient needs follow up ideally with Dr. Ardis Hughs in 3-5 weeks, but will likely be one of [place] / me :)  Thanks! JLL

## 2021-12-28 NOTE — Progress Notes (Addendum)
I have seen and examined the patient. I have personally reviewed the clinical findings, laboratory findings, microbiological data and imaging studies. The assessment and treatment plan was discussed with the Resident Crystal Mora. I agree with her/his recommendations except following additions/corrections.  Patient had already left from hospital by the time I came to see her. She was provided Linezolid and cephalexin for 10 days.   Vancomycin was added yesterday for concern for thrombophlebitis. No fevers since yesterday afternoon. Tolerating PO .  Leukocytosis has resolved. 5/17 blood cx no growth in less than 12 hrs. Fevers likely related rt cephalic vein superficial thrombophlebitis with nosocomial onset as was suspected. Patient also mentioned about temperatures being taken at night when she is wrapped in 5 blankets.  TTE unremarkable for vegetations and cannot exclude PFO. Plan to fu OP per GI and Pulm OP noted. Low concerns for opportunistic infections regarding the 55mm  rt LL cavitary nodule (incidental finding with no respiratory symptoms whatsoever)  but agree with need to follow up as suggested by Pulmonary.   Crystal Oz, MD Infectious Disease Physician [facility] [address] [place], [place] [address] Phone: [phone]  Fax: [facility]  Date of Admission:  12/22/2021                                    Total days of antibiotics 7  Cefepime/metronidazole 5/12 - 5/15 Meropenem 5/15 -    Vancomycin 5/17 -   ASSESSMENT and PLAN: Patient appears clinically well this morning.  She has no issue besides mild epigastric pain.  No fever since last night with significantly improved WBC.  Still unclear source of her fever.  Ultrasound Doppler revealed an acute superficial vein thrombosis of the right upper extremity, which might contribute to her fever.  Echocardiogram ruled out  endocarditis.  Pulmonology deferred nodule biopsy due to low yield with high risk.  Vancomycin was added yesterday for MRSA coverage.    If patient wants to leave against medical advice, can provider her with 10 days of linezolid and Keflex.  Principal Problem:   Epigastric pain Active Problems:   Acute gastric perforation   Pancreatic cyst   Gastric ulcer without hemorrhage or perforation   Leukocytosis   Scheduled Meds:  diazepam  2.5 mg Intravenous BID   estradiol  0.05 mg Transdermal Weekly   Glycerin (Adult)  1 suppository Rectal Daily   heparin  5,000 Units Subcutaneous Q8H    HYDROmorphone (DILAUDID) injection  0.5 mg Intravenous Q6H   lidocaine  1 patch Transdermal Q24H   pantoprazole  40 mg Intravenous Q12H   rosuvastatin  5 mg Oral Daily   sucralfate  1 g Oral TID WC & HS   Continuous Infusions:  lactated ringers 150 mL/hr at 12/28/21 0407   meropenem (MERREM) IV 2 g (12/28/21 0510)   vancomycin 1,000 mg (12/27/21 2001)   PRN Meds:.   SUBJECTIVE: Patient is seen at bedside with primary team and pulmonology.  She expresses frustration of prolonged hospital stay.  She stated she feels well, tolerating solid food okay without nausea, vomiting or diarrhea.  Besides mild epigastric abdominal pain, she has no other complaints.  Patient agrees with upper and lower extremity Doppler to rule out thrombus but she will leave this afternoon no matter what.  Review of Systems: ROS Per  HPI  Allergies  Allergen Reactions   Biaxin [Clarithromycin]    Morphine And Related Hives    Says it is okay with benadryl administration   Penicillins Hives, Itching and Swelling    Has patient had a PCN reaction causing immediate rash, facial/tongue/throat swelling, SOB or lightheadedness with hypotension: No Has patient had a PCN reaction causing severe rash involving mucus membranes or skin necrosis: No Has patient had a PCN reaction that required hospitalization No Has patient had a  PCN reaction occurring within the last 10 years: Yes If all of the above answers are "NO", then may proceed with Cephalosporin use.    Percocet [Oxycodone-Acetaminophen] Hives and Itching   Shrimp [Shellfish Allergy]    Citalopram Anxiety    aggression   Topiramate Rash    OBJECTIVE: Vitals:   12/27/21 1619 12/27/21 2313 12/28/21 0407 12/28/21 0804  BP: 115/81 120/74 122/75 126/85  Pulse: 90 95 91 96  Resp: 17 19 17 19   Temp: 98.3 F (36.8 C) 98.8 F (37.1 C) 99 F (37.2 C) 98.8 F (37.1 C)  TempSrc: Oral Oral Oral Oral  SpO2: 99% 100% 97% 97%  Weight:      Height:       Body mass index is 26.62 kg/m.  Physical Exam Constitutional:      General: She is not in acute distress.    Appearance: She is not ill-appearing.  Eyes:     General:        Right eye: No discharge.        Left eye: No discharge.     Conjunctiva/sclera: Conjunctivae normal.  Cardiovascular:     Rate and Rhythm: Normal rate and regular rhythm.     Heart sounds: Normal heart sounds.  Pulmonary:     Effort: Pulmonary effort is normal. No respiratory distress.     Breath sounds: Normal breath sounds.  Abdominal:     General: Bowel sounds are normal.     Palpations: Abdomen is soft.     Tenderness: There is abdominal tenderness (Mild epigastric pain to palpation). There is no guarding.  Musculoskeletal:     Right lower leg: No edema.     Left lower leg: No edema.     Comments: No swelling, erythema or pain to palpation of bilateral upper and lower extremities  Skin:    General: Skin is warm.  Neurological:     General: No focal deficit present.     Mental Status: She is alert.  Psychiatric:        Mood and Affect: Mood normal.    Lab Results Lab Results  Component Value Date   WBC 10.8 (H) 12/28/2021   HGB 12.0 12/28/2021   HCT 36.1 12/28/2021   MCV 86.8 12/28/2021   PLT 238 12/28/2021    Lab Results  Component Value Date   CREATININE 0.64 12/28/2021   BUN <5 (L) 12/28/2021   NA  138 12/28/2021   K 3.6 12/28/2021   CL 105 12/28/2021   CO2 24 12/28/2021    Lab Results  Component Value Date   ALT 28 12/27/2021   AST 45 (H) 12/27/2021   ALKPHOS 73 12/27/2021   BILITOT 0.5 12/27/2021     Microbiology: Recent Results (from the past 240 hour(s))  Resp Panel by RT-PCR (Flu A&B, Covid) Nasopharyngeal Swab     Status: None   Collection Time: 12/22/21 11:04 AM   Specimen: Nasopharyngeal Swab; Nasopharyngeal(NP) swabs in vial transport medium  Result Value Ref Range  Status   SARS Coronavirus 2 by RT PCR NEGATIVE NEGATIVE Final    Comment: (NOTE) SARS-CoV-2 target nucleic acids are NOT DETECTED.  The SARS-CoV-2 RNA is generally detectable in upper respiratory specimens during the acute phase of infection. The lowest concentration of SARS-CoV-2 viral copies this assay can detect is 138 copies/mL. A negative result does not preclude SARS-Cov-2 infection and should not be used as the sole basis for treatment or other patient management decisions. A negative result may occur with  improper specimen collection/handling, submission of specimen other than nasopharyngeal swab, presence of viral mutation(s) within the areas targeted by this assay, and inadequate number of viral copies(<138 copies/mL). A negative result must be combined with clinical observations, patient history, and epidemiological information. The expected result is Negative.  Fact Sheet for Patients:  EntrepreneurPulse.com.au  Fact Sheet for Healthcare Providers:  IncredibleEmployment.be  This test is no t yet approved or cleared by the [place] FDA and  has been authorized for detection and/or diagnosis of SARS-CoV-2 by FDA under an Emergency Use Authorization (EUA). This EUA will remain  in effect (meaning this test can be used) for the duration of the COVID-19 declaration under Section 564(b)(1) of the Act, 21 U.S.C.section 360bbb-3(b)(1), unless the  authorization is terminated  or revoked sooner.       Influenza A by PCR NEGATIVE NEGATIVE Final   Influenza B by PCR NEGATIVE NEGATIVE Final    Comment: (NOTE) The Xpert Xpress SARS-CoV-2/FLU/RSV plus assay is intended as an aid in the diagnosis of influenza from Nasopharyngeal swab specimens and should not be used as a sole basis for treatment. Nasal washings and aspirates are unacceptable for Xpert Xpress SARS-CoV-2/FLU/RSV testing.  Fact Sheet for Patients: EntrepreneurPulse.com.au  Fact Sheet for Healthcare Providers: IncredibleEmployment.be  This test is not yet approved or cleared by the [place] FDA and has been authorized for detection and/or diagnosis of SARS-CoV-2 by FDA under an Emergency Use Authorization (EUA). This EUA will remain in effect (meaning this test can be used) for the duration of the COVID-19 declaration under Section 564(b)(1) of the Act, 21 U.S.C. section 360bbb-3(b)(1), unless the authorization is terminated or revoked.  Performed at [facility], [place] [address], [place], [place] [address]   Blood culture (routine x 2)     Status: None   Collection Time: 12/22/21  3:25 PM   Specimen: BLOOD  Result Value Ref Range Status   Specimen Description BLOOD LEFT ANTECUBITAL  Final   Special Requests   Final    BOTTLES DRAWN AEROBIC AND ANAEROBIC Blood Culture results may not be optimal due to an excessive volume of blood received in culture bottles   Culture   Final    NO GROWTH 5 DAYS Performed at [facility], [place] [address], [place], [place] [address]    Report Status 12/27/2021 FINAL  Final  Blood culture (routine x 2)     Status: None   Collection Time: 12/22/21  3:29 PM   Specimen: BLOOD  Result Value Ref Range Status   Specimen Description BLOOD BLOOD LEFT FOREARM  Final   Special Requests   Final    BOTTLES DRAWN AEROBIC AND ANAEROBIC Blood Culture results may not be optimal due to an  excessive volume of blood received in culture bottles   Culture   Final    NO GROWTH 5 DAYS Performed at [facility], [place] [address] [place], [place] [address]    Report Status 12/27/2021 FINAL  Final  Culture, blood (Routine X 2) w Reflex to ID Panel     Status: None (Preliminary result)   Collection Time: 12/25/21  7:34 AM   Specimen: BLOOD RIGHT HAND  Result Value Ref Range Status   Specimen Description BLOOD RIGHT HAND  Final   Special Requests   Final    BOTTLES DRAWN AEROBIC ONLY Blood Culture results may not be optimal due to an inadequate volume of blood received in culture bottles   Culture   Final    NO GROWTH 3 DAYS Performed at [facility], [place] [address], [place], [place] [address]    Report Status PENDING  Incomplete  Culture, blood (Routine X 2) w Reflex to ID Panel     Status: None (Preliminary result)   Collection Time: 12/25/21  7:40 AM   Specimen: BLOOD LEFT HAND  Result Value Ref Range Status   Specimen Description BLOOD LEFT HAND  Final   Special Requests   Final    BOTTLES DRAWN AEROBIC AND ANAEROBIC Blood Culture adequate volume   Culture   Final    NO GROWTH 3 DAYS Performed at [facility], [place] [address] [place], [place] [address]    Report Status PENDING  Incomplete  Urine Culture     Status: None   Collection Time: 12/25/21  7:48 AM   Specimen: Urine, Clean Catch  Result Value Ref Range Status   Specimen Description URINE, CLEAN CATCH  Final   Special Requests NONE  Final   Culture   Final    NO GROWTH Performed at [facility], [place] [address] [place], [place] [address]    Report Status 12/26/2021 FINAL  Final  Culture, blood (Routine X 2) w Reflex to ID Panel     Status: None (Preliminary result)   Collection Time: 12/27/21 12:50 PM   Specimen: BLOOD  Result Value Ref Range Status   Specimen Description BLOOD RIGHT ANTECUBITAL  Final   Special Requests   Final    BOTTLES DRAWN AEROBIC AND ANAEROBIC Blood Culture  adequate volume   Culture   Final    NO GROWTH < 24 HOURS Performed at [facility], [place] [address], [place], [place] [address]    Report Status PENDING  Incomplete  Culture, blood (Routine X 2) w Reflex to ID Panel     Status: None (Preliminary result)   Collection Time: 12/27/21 12:57 PM   Specimen: BLOOD  Result Value Ref Range Status   Specimen Description BLOOD BLOOD RIGHT HAND  Final   Special Requests   Final    BOTTLES DRAWN AEROBIC AND ANAEROBIC Blood Culture adequate volume   Culture   Final    NO GROWTH < 24 HOURS Performed at [facility], [place] [address] [place],  [address]    Report Status PENDING  Incomplete   Imaging DG Lumbar Spine 1 View  Result Date: 12/27/2021 CLINICAL DATA:  Low back pain for many years. No injury. History of sciatic nerve pain. EXAM: LUMBAR SPINE - 1 VIEW COMPARISON:  None Available. FINDINGS: Single AP view of the lumbar spine. Vertebral body heights are maintained. Sacroiliac joints are unremarkable. IMPRESSION: No acute abnormality on single AP view of the lumbar spine. Electronically Signed   By: Keane Police D.O.   On: 12/27/2021 15:12   ECHOCARDIOGRAM COMPLETE  Result Date: 12/27/2021    ECHOCARDIOGRAM REPORT   Patient Name:   Crystal Mora Date of Exam:  12/27/2021 Medical Rec #:  [id number]        Height:       66.0 in Accession #:    [id number]       Weight:       164.9 lb Date of Birth:  22-Jun-1976        BSA:          1.842 m Patient Age:    28 years         BP:           143/97 mmHg Patient Gender: F                HR:           101 bpm. Exam Location:  Inpatient Procedure: 2D Echo, Cardiac Doppler and Color Doppler Indications:    Murmur  History:        Patient has no prior history of Echocardiogram examinations.                 Risk Factors:Hypertension, Diabetes and HLD.  Sonographer:    Joette Catching RCS Referring Phys: [place]  1. Left ventricular ejection fraction, by estimation, is  60 to 65%. The left ventricle has normal function. The left ventricle has no regional wall motion abnormalities. Left ventricular diastolic parameters were normal.  2. Right ventricular systolic function is normal. The right ventricular size is normal. There is normal pulmonary artery systolic pressure.  3. The mitral valve is normal in structure. No evidence of mitral valve regurgitation. No evidence of mitral stenosis.  4. The aortic valve is tricuspid. Aortic valve regurgitation is not visualized. No aortic stenosis is present.  5. The inferior vena cava is normal in size with greater than 50% respiratory variability, suggesting right atrial pressure of 3 mmHg.  6. Cannot exclude a small PFO. Comparison(s): No prior Echocardiogram. Conclusion(s)/Recommendation(s): Normal biventricular function without evidence of hemodynamically significant valvular heart disease. FINDINGS  Left Ventricle: Left ventricular ejection fraction, by estimation, is 60 to 65%. The left ventricle has normal function. The left ventricle has no regional wall motion abnormalities. The left ventricular internal cavity size was normal in size. There is  no left ventricular hypertrophy. Left ventricular diastolic parameters were normal. Right Ventricle: The right ventricular size is normal. No increase in right ventricular wall thickness. Right ventricular systolic function is normal. There is normal pulmonary artery systolic pressure. The tricuspid regurgitant velocity is 2.18 m/s, and  with an assumed right atrial pressure of 3 mmHg, the estimated right ventricular systolic pressure is 47.4 mmHg. Left Atrium: Left atrial size was normal in size. Right Atrium: Right atrial size was normal in size. Pericardium: Trivial pericardial effusion is present. Mitral Valve: The mitral valve is normal in structure. No evidence of mitral valve regurgitation. No evidence of mitral valve stenosis. Tricuspid Valve: The tricuspid valve is normal in  structure. Tricuspid valve regurgitation is trivial. No evidence of tricuspid stenosis. Aortic Valve: The aortic valve is tricuspid. Aortic valve regurgitation is not visualized. No aortic stenosis is present. Aortic valve mean gradient measures 6.0 mmHg. Aortic valve peak gradient measures 11.2 mmHg. Aortic valve area, by VTI measures 2.48  cm. Pulmonic Valve: The pulmonic valve was grossly normal. Pulmonic valve regurgitation is not visualized. No evidence of pulmonic stenosis. Aorta: The aortic root, ascending aorta, aortic arch and descending aorta are all structurally normal, with no evidence of dilitation or obstruction. Venous: The inferior vena cava is normal in size with greater  than 50% respiratory variability, suggesting right atrial pressure of 3 mmHg. IAS/Shunts: Cannot exclude a small PFO.  LEFT VENTRICLE PLAX 2D LVIDd:         4.90 cm   Diastology LVIDs:         3.40 cm   LV e' medial:    9.46 cm/s LV PW:         1.00 cm   LV E/e' medial:  9.9 LV IVS:        1.00 cm   LV e' lateral:   11.90 cm/s LVOT diam:     2.00 cm   LV E/e' lateral: 7.9 LV SV:         67 LV SV Index:   36 LVOT Area:     3.14 cm  RIGHT VENTRICLE             IVC RV Basal diam:  1.70 cm     IVC diam: 1.60 cm RV Mid diam:    1.50 cm RV S prime:     14.50 cm/s TAPSE (M-mode): 2.0 cm LEFT ATRIUM             Index        RIGHT ATRIUM          Index LA diam:        3.10 cm 1.68 cm/m   RA Area:     6.91 cm LA Vol (A2C):   33.8 ml 18.35 ml/m  RA Volume:   8.08 ml  4.39 ml/m LA Vol (A4C):   37.7 ml 20.46 ml/m LA Biplane Vol: 38.1 ml 20.68 ml/m  AORTIC VALVE                     PULMONIC VALVE AV Area (Vmax):    2.18 cm      PV Vmax:       1.13 m/s AV Area (Vmean):   2.17 cm      PV Peak grad:  5.1 mmHg AV Area (VTI):     2.48 cm AV Vmax:           167.00 cm/s AV Vmean:          120.000 cm/s AV VTI:            0.269 m AV Peak Grad:      11.2 mmHg AV Mean Grad:      6.0 mmHg LVOT Vmax:         116.00 cm/s LVOT Vmean:        82.900  cm/s LVOT VTI:          0.212 m LVOT/AV VTI ratio: 0.79  AORTA Ao Root diam: 3.00 cm Ao Asc diam:  3.00 cm MITRAL VALVE               TRICUSPID VALVE MV Area (PHT): 7.51 cm    TR Peak grad:   19.0 mmHg MV Decel Time: 101 msec    TR Vmax:        218.00 cm/s MV E velocity: 94.10 cm/s MV A velocity: 69.40 cm/s  SHUNTS MV E/A ratio:  1.36        Systemic VTI:  0.21 m                            Systemic Diam: 2.00 cm Buford Dresser MD Electronically signed by Buford Dresser MD Signature Date/Time: 12/27/2021/9:14:42 PM    Final  VAS [place] LOWER EXTREMITY VENOUS (DVT)  Result Date: 12/28/2021  Lower Venous DVT Study Patient Name:  Crystal Mora  Date of Exam:   12/28/2021 Medical Rec #: [id number]         Accession #:    [id number] Date of Birth: 03-05-1976         Patient Gender: F Patient Age:   59 years Exam Location:  [facility] Procedure:      VAS [place] LOWER EXTREMITY VENOUS (DVT) Referring Phys: MARSHALL CHAMBLISS --------------------------------------------------------------------------------  Indications: Fever unkown origin.  Comparison Study: No prior studies. Performing Technologist: Darlin Coco RDMS, RVT  Examination Guidelines: A complete evaluation includes B-mode imaging, spectral Doppler, color Doppler, and power Doppler as needed of all accessible portions of each vessel. Bilateral testing is considered an integral part of a complete examination. Limited examinations for reoccurring indications may be performed as noted. The reflux portion of the exam is performed with the patient in reverse Trendelenburg.  +---------+---------------+---------+-----------+----------+--------------+ RIGHT    CompressibilityPhasicitySpontaneityPropertiesThrombus Aging +---------+---------------+---------+-----------+----------+--------------+ CFV      Full           Yes      Yes                                 +---------+---------------+---------+-----------+----------+--------------+  SFJ      Full                                                        +---------+---------------+---------+-----------+----------+--------------+ FV Prox  Full                                                        +---------+---------------+---------+-----------+----------+--------------+ FV Mid   Full                                                        +---------+---------------+---------+-----------+----------+--------------+ FV DistalFull                                                        +---------+---------------+---------+-----------+----------+--------------+ PFV      Full                                                        +---------+---------------+---------+-----------+----------+--------------+ POP      Full           Yes      Yes                                 +---------+---------------+---------+-----------+----------+--------------+ PTV  Full                                                        +---------+---------------+---------+-----------+----------+--------------+ PERO     Full                                                        +---------+---------------+---------+-----------+----------+--------------+ Gastroc  Full                                                        +---------+---------------+---------+-----------+----------+--------------+   +---------+---------------+---------+-----------+----------+--------------+ LEFT     CompressibilityPhasicitySpontaneityPropertiesThrombus Aging +---------+---------------+---------+-----------+----------+--------------+ CFV      Full           Yes      Yes                                 +---------+---------------+---------+-----------+----------+--------------+ SFJ      Full                                                        +---------+---------------+---------+-----------+----------+--------------+ FV Prox  Full                                                         +---------+---------------+---------+-----------+----------+--------------+ FV Mid   Full                                                        +---------+---------------+---------+-----------+----------+--------------+ FV DistalFull                                                        +---------+---------------+---------+-----------+----------+--------------+ PFV      Full                                                        +---------+---------------+---------+-----------+----------+--------------+ POP      Full           Yes      Yes                                 +---------+---------------+---------+-----------+----------+--------------+  PTV      Full                                                        +---------+---------------+---------+-----------+----------+--------------+ PERO     Full                                                        +---------+---------------+---------+-----------+----------+--------------+ Gastroc  Full                                                        +---------+---------------+---------+-----------+----------+--------------+     Summary: RIGHT: - There is no evidence of deep vein thrombosis in the lower extremity.  - No cystic structure found in the popliteal fossa.  LEFT: - There is no evidence of deep vein thrombosis in the lower extremity.  - No cystic structure found in the popliteal fossa.  *See table(s) above for measurements and observations.    Preliminary    VAS [place] UPPER EXTREMITY VENOUS DUPLEX  Result Date: 12/28/2021 UPPER VENOUS STUDY  Patient Name:  Crystal Mora  Date of Exam:   12/28/2021 Medical Rec #: [id number]         Accession #:    [id number] Date of Birth: [birth year]         Patient Gender: F Patient Age:   89 years Exam Location:  [facility] Procedure:      VAS [place] UPPER EXTREMITY VENOUS DUPLEX Referring Phys: MARSHALL CHAMBLISS  --------------------------------------------------------------------------------  Indications: Fever unknown origin Other Indications: SVT with IV placement. Comparison Study: No prior studies. Performing Technologist: Darlin Coco RDMS, RVT  Examination Guidelines: A complete evaluation includes B-mode imaging, spectral Doppler, color Doppler, and power Doppler as needed of all accessible portions of each vessel. Bilateral testing is considered an integral part of a complete examination. Limited examinations for reoccurring indications may be performed as noted.  Right Findings: +----------+------------+---------+-----------+----------+-------+ RIGHT     CompressiblePhasicitySpontaneousPropertiesSummary +----------+------------+---------+-----------+----------+-------+ IJV           Full       Yes       Yes                      +----------+------------+---------+-----------+----------+-------+ Subclavian               Yes       Yes                      +----------+------------+---------+-----------+----------+-------+ Axillary      Full       Yes       Yes                      +----------+------------+---------+-----------+----------+-------+ Brachial      Full                                          +----------+------------+---------+-----------+----------+-------+  Radial        Full                                          +----------+------------+---------+-----------+----------+-------+ Ulnar         Full                                          +----------+------------+---------+-----------+----------+-------+ Cephalic    Partial      Yes       Yes               Acute  +----------+------------+---------+-----------+----------+-------+ Basilic       Full                                          +----------+------------+---------+-----------+----------+-------+  Left Findings: +----------+------------+---------+-----------+----------+-------+ LEFT       CompressiblePhasicitySpontaneousPropertiesSummary +----------+------------+---------+-----------+----------+-------+ IJV           Full       Yes       Yes                      +----------+------------+---------+-----------+----------+-------+ Subclavian               Yes       Yes                      +----------+------------+---------+-----------+----------+-------+ Axillary      Full       Yes       Yes                      +----------+------------+---------+-----------+----------+-------+ Brachial      Full                                          +----------+------------+---------+-----------+----------+-------+ Radial        Full                                          +----------+------------+---------+-----------+----------+-------+ Ulnar         Full                                          +----------+------------+---------+-----------+----------+-------+ Cephalic      Full                                          +----------+------------+---------+-----------+----------+-------+ Basilic       Full                                          +----------+------------+---------+-----------+----------+-------+  Summary:  Right: No evidence of deep vein thrombosis in the  upper extremity. Findings consistent with acute superficial vein thrombosis involving the right cephalic vein at the level of the proximal to mid forearm.  Left: No evidence of deep vein thrombosis in the upper extremity. No evidence of superficial vein thrombosis in the upper extremity.  *See table(s) above for measurements and observations.     Preliminary     Crystal Mora, [facility]  12/28/2021, 8:31 AM

## 2021-12-28 NOTE — Progress Notes (Signed)
I have been notified that patient has left AMA at 1130 today.

## 2021-12-28 NOTE — TOC Transition Note (Signed)
Transition of Care [facility]) - CM/SW Discharge Note   Patient Details  Name: Crystal Mora MRN: [id number] Date of Birth: November 20, 1975  Transition of Care [facility]) CM/SW Contact:  Cyndi Bender, RN Phone Number: 12/28/2021, 12:02 PM   Clinical Narrative:    Patient left AMA before RNCM to speak to her. Bedside RN notified this RNCM that patient plans to return to the hospital but left due to her daughter having prom.   Final next level of care: Home/Self Care Barriers to Discharge: Continued Medical Work up   Patient Goals and CMS Choice Patient states their goals for this hospitalization and ongoing recovery are:: To return home CMS Medicare.gov Compare Post Acute Care list provided to:: Patient Choice offered to / list presented to : NA  Discharge Placement                       Discharge Plan and Services   Discharge Planning Services: CM Consult Post Acute Care Choice: NA          DME Arranged: N/A DME Agency: NA       HH Arranged: NA HH Agency: NA        Social Determinants of Health (SDOH) Interventions     Readmission Risk Interventions     View : No data to display.

## 2021-12-28 NOTE — Progress Notes (Signed)
NAME:  Crystal Mora, MRN:  [id number], DOB:  09-10-1975, LOS: 6 ADMISSION DATE:  12/22/2021, CONSULTATION DATE:  5/17 REFERRING MD:  Erin Hearing, CHIEF COMPLAINT:  cavitary pulmonary nodule    History of Present Illness:  34 yof admitted 5/12 w/ cc abd pain and nausea. Dx eval after GI consultation was concerning for perforated gastric ulcer.  She was seen by surgery and conservative treatment w/ antibiotics were recommended. During her initial evaluation CT imaging of Chest/abd and pelvis were completed. In addition to the above abdominal pathology a 12 mm cavitary nodule was identified in the RLL for which we were consulted.   Pertinent  Medical History  Bipolar disease type I, chronic urticaria on xolair, depression, anxiety, HTN, thyroid disease, GERD, Chronic low back pain, former tobacco abuse. Was on Humira for HS  Significant Hospital Events: Including procedures, antibiotic start and stop dates in addition to other pertinent events   5/12 admitted. Seen by GI and surg. Treated conservatively for perf gastric ulcer w/ hydration, abx, PPI and bowel rest. CT imaging was reviewed and concern for pancreatic cyst noted by radiology was felt could be contained perforation but would eventually need pancreatic MRI 5/15 spiking fevers, abx changed to meropenem, UGI series done: no evidence of perf  5/16 GI called back.  5/17 seen by ID for on-going fevers. Pulm asked to see. ID consulted. PCT > 150 , abx broadened to mero + vanc. TTE without obvious vegetation  5/18 no further fever. Wants to leave AMA   Interim History / Subjective:  Abx changed to vanc, mero after PCT resulted > 150   TTE without obvious vegetation. Unable to exclude small PFO  Objective   Blood pressure 122/75, pulse 91, temperature 99 F (37.2 C), temperature source Oral, resp. rate 17, height 5\' 6"  (1.676 m), weight 74.8 kg, SpO2 97 %.        Intake/Output Summary (Last 24 hours) at 12/28/2021 0738 Last data filed  at 12/27/2021 1726 Gross per 24 hour  Intake 2996.22 ml  Output --  Net 2996.22 ml   Filed Weights   12/22/21 1014  Weight: 74.8 kg    Examination: General: WDWN middle aged F seated in bed NAD  HENT: NCAT pink mm Lungs: CTAb even unlabored on RA  Cardiovascular: rrr s1s2 no rgm  Abdomen: soft. Mild non-specific tenderness. + bowel sounds  Extremities: no acute joint deformity  Neuro: AAOx4 following commands    Resolved Hospital Problem list     Assessment & Plan:   Patient desires to leave [facility] 5/18. Primary team is discussing w patient.  Plan if remains inpatient:   RLL subpleural cavitary nodule, 92mm Persistent fevers -- interval improvement  -ddx -- most concerning for infection.  Malignancy less favored given personal hx though there is hx lung ca on paternal side.  -RE infection -- Bx neg 5/12, 5/15, and TTE (5/17) did not have evidence of obvious vegetation, argue against occult endocarditis.  TB unlikely.  Possible thrombophlebitis though seems infectious response could be somewhat out of proportion for acute PIV related thrombophlebitis. CT a/p didn't have obvious collection suspicious for infection. There is  ? Pancreatic pseudocyst and simple appearing ovarian cyst.  -abd pain overall improving. Unlikely to be related to leukocytosis + fever  P -TTE without evidence of obvious endocarditis. Unable to exclude small PFO  -follow bcx - ideally would cont mero, vanc  - repeat CT chest non con in 6-8wks  - CT guided biopsy would be  high risk, likely low yield -- defer    Pancreatic cyst/radiologic evidence of pancreatitis -not sure how to tie this to lung findings, GI following, EGD deferred  Best Practice (right click and "Reselect all SmartList Selections" daily)   Per primary service  Labs   CBC: Recent Labs  Lab 12/24/21 0307 12/25/21 0330 12/26/21 0558 12/27/21 0101 12/28/21 0109  WBC 10.7* 15.4* 17.1* 16.3* 10.8*  NEUTROABS  --   --   --  15.4*  8.9*  HGB 12.4 13.1 13.2 12.5 12.0  HCT 38.7 38.6 37.5 36.4 36.1  MCV 88.8 86.4 84.5 85.6 86.8  PLT 222 216 209 217 371    Basic Metabolic Panel: Recent Labs  Lab 12/24/21 0307 12/25/21 0330 12/26/21 0558 12/27/21 0101 12/28/21 0109  NA 136 135 133* 134* 138  K 3.1* 3.6 3.2* 3.3* 3.6  CL 101 102 103 102 105  CO2 24 19* 23 24 24   GLUCOSE 87 76 187* 97 83  BUN 6 <5* <5* <5* <5*  CREATININE 0.74 0.88 0.80 0.69 0.64  CALCIUM 8.2* 8.4* 8.2* 7.9* 8.1*  MG 1.9  --   --   --   --    GFR: Estimated Creatinine Clearance: 92.8 mL/min (by C-G formula based on SCr of 0.64 mg/dL). Recent Labs  Lab 12/22/21 1525 12/22/21 2301 12/23/21 0233 12/25/21 0330 12/26/21 0558 12/27/21 0101 12/27/21 1824 12/28/21 0109  PROCALCITON  --   --   --   --   --   --  >150.00 137.19  WBC  --   --    < > 15.4* 17.1* 16.3*  --  10.8*  LATICACIDVEN 0.7 0.5  --   --   --   --   --   --    < > = values in this interval not displayed.    Liver Function Tests: Recent Labs  Lab 12/22/21 1020 12/26/21 0558 12/27/21 0101  AST 12* 27 45*  ALT 13 16 28   ALKPHOS 68 78 73  BILITOT 0.6 1.0 0.5  PROT 6.8 6.0* 5.6*  ALBUMIN 3.1* 2.2* 2.1*   Recent Labs  Lab 12/22/21 1020 12/25/21 0330 12/26/21 0558  LIPASE 26 25 24    No results for input(s): AMMONIA in the last 168 hours.  ABG No results found for: PHART, PCO2ART, PO2ART, HCO3, TCO2, ACIDBASEDEF, O2SAT   Coagulation Profile: No results for input(s): INR, PROTIME in the last 168 hours.  Cardiac Enzymes: No results for input(s): CKTOTAL, CKMB, CKMBINDEX, TROPONINI in the last 168 hours.  HbA1C: No results found for: HGBA1C  CBG: No results for input(s): GLUCAP in the last 168 hours.   Eliseo Gum MSN, AGACNP-BC [place] for pager  12/28/2021, 10:09 AM

## 2021-12-28 NOTE — Discharge Instructions (Addendum)
Dear Crystal Mora,  Thank you for letting us participate in your care. You were hospitalized for epigastric pain. You have a very complex medical picture and we do not know exactly why you are having a fever despite many tests. We involved surgery, gastroenterology (GI), pulmonology, and infectious disease doctors in your care.   Today you are choosing to leave the hospital against medical advice.   POST-HOSPITAL & CARE INSTRUCTIONS Take your two antibiotics (linezolid and keflex) as prescribed for the next 10 days. Do not skip doses. Take it until your pills run out.  Keep using the carafate and acid reducer medicine.  Follow up with your PCP.  Go to your follow up appointments (listed below)   DOCTOR'S APPOINTMENT   Future Appointments  Date Time Provider [place]  02/02/2022  3:00 PM Milus Banister, MD [facility]    Follow-up Information     [place]. Schedule an appointment as soon as possible for a visit in 1 week(s).   Specialty: Family Medicine Why: Please make a hospital follow up with your primay care doctor. Should be about one week from hospital discharge. Contact information: [place] [address] [phone]         [place]. Go to.   Specialty: Emergency Medicine Why: If symptoms worsen, As needed Contact information: [address] [id number] [place] [phone]               Take care and be well!  [facility]  [place], [place] [address] [phone]

## 2021-12-28 NOTE — Progress Notes (Addendum)
Subjective: Says she feels well today and wanting to go home for daughter's prom tomorrow.  Ate a sandwich, fries, etc yesterday and says that didn't make her pain worse.  Still with fevers overnight/yesterday  Objective: Vital signs in last 24 hours: Temp:  [98.3 F (36.8 C)-102.7 F (39.3 C)] 98.8 F (37.1 C) (05/18 0804) Pulse Rate:  [90-100] 96 (05/18 0804) Resp:  [17-19] 19 (05/18 0804) BP: (115-143)/(74-97) 126/85 (05/18 0804) SpO2:  [95 %-100 %] 97 % (05/18 0804) Last BM Date : 12/26/21  Intake/Output from previous day: 05/17 0701 - 05/18 0700 In: 2996.2 [P.O.:1080; I.V.:1316.2; IV Piggyback:600] Out: -  Intake/Output this shift: No intake/output data recorded.  PE: Gen:  NAD Abd: Soft, mild tenderness in epigastric, but nondistended  Lab Results:  Recent Labs    12/27/21 0101 12/28/21 0109  WBC 16.3* 10.8*  HGB 12.5 12.0  HCT 36.4 36.1  PLT 217 238   BMET Recent Labs    12/27/21 0101 12/28/21 0109  NA 134* 138  K 3.3* 3.6  CL 102 105  CO2 24 24  GLUCOSE 97 83  BUN <5* <5*  CREATININE 0.69 0.64  CALCIUM 7.9* 8.1*   PT/INR No results for input(s): LABPROT, INR in the last 72 hours. CMP     Component Value Date/Time   NA 138 12/28/2021 0109   K 3.6 12/28/2021 0109   CL 105 12/28/2021 0109   CO2 24 12/28/2021 0109   GLUCOSE 83 12/28/2021 0109   BUN <5 (L) 12/28/2021 0109   CREATININE 0.64 12/28/2021 0109   CALCIUM 8.1 (L) 12/28/2021 0109   PROT 5.6 (L) 12/27/2021 0101   ALBUMIN 2.1 (L) 12/27/2021 0101   AST 45 (H) 12/27/2021 0101   ALT 28 12/27/2021 0101   ALKPHOS 73 12/27/2021 0101   BILITOT 0.5 12/27/2021 0101   GFRNONAA >60 12/28/2021 0109   GFRAA >60 10/19/2016 2000   Lipase     Component Value Date/Time   LIPASE 24 12/26/2021 0558    Studies/Results: DG Lumbar Spine 1 View  Result Date: 12/27/2021 CLINICAL DATA:  Low back pain for many years. No injury. History of sciatic nerve pain. EXAM: LUMBAR SPINE - 1 VIEW  COMPARISON:  None Available. FINDINGS: Single AP view of the lumbar spine. Vertebral body heights are maintained. Sacroiliac joints are unremarkable. IMPRESSION: No acute abnormality on single AP view of the lumbar spine. Electronically Signed   By: Keane Police D.O.   On: 12/27/2021 15:12   ECHOCARDIOGRAM COMPLETE  Result Date: 12/27/2021    ECHOCARDIOGRAM REPORT   Patient Name:   Crystal Mora Date of Exam: 12/27/2021 Medical Rec #:  [id number]        Height:       66.0 in Accession #:    [id number]       Weight:       164.9 lb Date of Birth:  02-Sep-1975        BSA:          1.842 m Patient Age:    46 years         BP:           143/97 mmHg Patient Gender: F                HR:           101 bpm. Exam Location:  Inpatient Procedure: 2D Echo, Cardiac Doppler and Color Doppler Indications:    Murmur  History:  Patient has no prior history of Echocardiogram examinations.                 Risk Factors:Hypertension, Diabetes and HLD.  Sonographer:    Joette Catching RCS Referring Phys: [place]  1. Left ventricular ejection fraction, by estimation, is 60 to 65%. The left ventricle has normal function. The left ventricle has no regional wall motion abnormalities. Left ventricular diastolic parameters were normal.  2. Right ventricular systolic function is normal. The right ventricular size is normal. There is normal pulmonary artery systolic pressure.  3. The mitral valve is normal in structure. No evidence of mitral valve regurgitation. No evidence of mitral stenosis.  4. The aortic valve is tricuspid. Aortic valve regurgitation is not visualized. No aortic stenosis is present.  5. The inferior vena cava is normal in size with greater than 50% respiratory variability, suggesting right atrial pressure of 3 mmHg.  6. Cannot exclude a small PFO. Comparison(s): No prior Echocardiogram. Conclusion(s)/Recommendation(s): Normal biventricular function without evidence of hemodynamically  significant valvular heart disease. FINDINGS  Left Ventricle: Left ventricular ejection fraction, by estimation, is 60 to 65%. The left ventricle has normal function. The left ventricle has no regional wall motion abnormalities. The left ventricular internal cavity size was normal in size. There is  no left ventricular hypertrophy. Left ventricular diastolic parameters were normal. Right Ventricle: The right ventricular size is normal. No increase in right ventricular wall thickness. Right ventricular systolic function is normal. There is normal pulmonary artery systolic pressure. The tricuspid regurgitant velocity is 2.18 m/s, and  with an assumed right atrial pressure of 3 mmHg, the estimated right ventricular systolic pressure is 86.7 mmHg. Left Atrium: Left atrial size was normal in size. Right Atrium: Right atrial size was normal in size. Pericardium: Trivial pericardial effusion is present. Mitral Valve: The mitral valve is normal in structure. No evidence of mitral valve regurgitation. No evidence of mitral valve stenosis. Tricuspid Valve: The tricuspid valve is normal in structure. Tricuspid valve regurgitation is trivial. No evidence of tricuspid stenosis. Aortic Valve: The aortic valve is tricuspid. Aortic valve regurgitation is not visualized. No aortic stenosis is present. Aortic valve mean gradient measures 6.0 mmHg. Aortic valve peak gradient measures 11.2 mmHg. Aortic valve area, by VTI measures 2.48  cm. Pulmonic Valve: The pulmonic valve was grossly normal. Pulmonic valve regurgitation is not visualized. No evidence of pulmonic stenosis. Aorta: The aortic root, ascending aorta, aortic arch and descending aorta are all structurally normal, with no evidence of dilitation or obstruction. Venous: The inferior vena cava is normal in size with greater than 50% respiratory variability, suggesting right atrial pressure of 3 mmHg. IAS/Shunts: Cannot exclude a small PFO.  LEFT VENTRICLE PLAX 2D LVIDd:          4.90 cm   Diastology LVIDs:         3.40 cm   LV e' medial:    9.46 cm/s LV PW:         1.00 cm   LV E/e' medial:  9.9 LV IVS:        1.00 cm   LV e' lateral:   11.90 cm/s LVOT diam:     2.00 cm   LV E/e' lateral: 7.9 LV SV:         67 LV SV Index:   36 LVOT Area:     3.14 cm  RIGHT VENTRICLE             IVC RV  Basal diam:  1.70 cm     IVC diam: 1.60 cm RV Mid diam:    1.50 cm RV S prime:     14.50 cm/s TAPSE (M-mode): 2.0 cm LEFT ATRIUM             Index        RIGHT ATRIUM          Index LA diam:        3.10 cm 1.68 cm/m   RA Area:     6.91 cm LA Vol (A2C):   33.8 ml 18.35 ml/m  RA Volume:   8.08 ml  4.39 ml/m LA Vol (A4C):   37.7 ml 20.46 ml/m LA Biplane Vol: 38.1 ml 20.68 ml/m  AORTIC VALVE                     PULMONIC VALVE AV Area (Vmax):    2.18 cm      PV Vmax:       1.13 m/s AV Area (Vmean):   2.17 cm      PV Peak grad:  5.1 mmHg AV Area (VTI):     2.48 cm AV Vmax:           167.00 cm/s AV Vmean:          120.000 cm/s AV VTI:            0.269 m AV Peak Grad:      11.2 mmHg AV Mean Grad:      6.0 mmHg LVOT Vmax:         116.00 cm/s LVOT Vmean:        82.900 cm/s LVOT VTI:          0.212 m LVOT/AV VTI ratio: 0.79  AORTA Ao Root diam: 3.00 cm Ao Asc diam:  3.00 cm MITRAL VALVE               TRICUSPID VALVE MV Area (PHT): 7.51 cm    TR Peak grad:   19.0 mmHg MV Decel Time: 101 msec    TR Vmax:        218.00 cm/s MV E velocity: 94.10 cm/s MV A velocity: 69.40 cm/s  SHUNTS MV E/A ratio:  1.36        Systemic VTI:  0.21 m                            Systemic Diam: 2.00 cm Buford Dresser MD Electronically signed by Buford Dresser MD Signature Date/Time: 12/27/2021/9:14:42 PM    Final    [place] EKG SITE RITE  Result Date: 12/26/2021 If Site Rite image not attached, placement could not be confirmed due to current cardiac rhythm.   Anti-infectives: Anti-infectives (From admission, onward)    Start     Dose/Rate Route Frequency Ordered Stop   12/28/21 0000  linezolid (ZYVOX) 600 MG tablet         600 mg Oral 2 times daily 12/28/21 0902 01/07/22 2359   12/28/21 0000  cephALEXin (KEFLEX) 500 MG capsule        500 mg Oral 2 times daily 12/28/21 0902 01/07/22 2359   12/27/21 1900  vancomycin (VANCOCIN) IVPB 1000 mg/200 mL premix        1,000 mg 200 mL/hr over 60 Minutes Intravenous Every 12 hours 12/27/21 1809     12/25/21 1600  metroNIDAZOLE (FLAGYL) IVPB 500 mg  Status:  Discontinued  500 mg 100 mL/hr over 60 Minutes Intravenous Every 12 hours 12/25/21 0748 12/25/21 0842   12/25/21 1400  ceFEPIme (MAXIPIME) 2 g in sodium chloride 0.9 % 100 mL IVPB  Status:  Discontinued        2 g 200 mL/hr over 30 Minutes Intravenous Every 8 hours 12/25/21 0748 12/25/21 0841   12/25/21 0930  meropenem (MERREM) 2 g in sodium chloride 0.9 % 100 mL IVPB        2 g 280 mL/hr over 30 Minutes Intravenous Every 8 hours 12/25/21 0842     12/22/21 2300  ceFEPIme (MAXIPIME) 2 g in sodium chloride 0.9 % 100 mL IVPB  Status:  Discontinued        2 g 200 mL/hr over 30 Minutes Intravenous Every 8 hours 12/22/21 1440 12/25/21 0720   12/22/21 1445  ceFEPIme (MAXIPIME) 2 g in sodium chloride 0.9 % 100 mL IVPB        2 g 200 mL/hr over 30 Minutes Intravenous  Once 12/22/21 1436 12/22/21 1745   12/22/21 1445  metroNIDAZOLE (FLAGYL) IVPB 500 mg  Status:  Discontinued        500 mg 100 mL/hr over 60 Minutes Intravenous Every 12 hours 12/22/21 1436 12/25/21 0721        Assessment/Plan Posterior contained perforated gastric ulcer and pancreatic lesion - CT w/ 3.3 x 2.7 x 2.6 cm complex multicystic lesion along the cranial margin of the pancreatic body extending up adjacent to the gastric fundus inferior to the esophagogastric junction. GI and our team has reviewed and felt 2/2 posterior contained perforated gastric ulcer. No current indication for surgical intervention.  - High risk w/ longstanding hx of NSAID use (50 BC powders per week) and has been on prednisone for the past 6 to 8 weeks - H. Pylori  negative - PPI gtt, IV abx - continues to be febrile to 102.8 today.  This is generally not the type of fever someone runs with an intra-abdominal infectious source. -CT scan remains stable with no evidence of infection, but inflammation around the stomach and pancreas.  Lipase normal.   -UGI negative for gastric perforation -doubt this is etiology of fevers at this point as no evidence for infection on imaging. -GI will likely do outpatient EUS etc for pancreatic lesion and gastric evaluation -blood culture and urine culture negative thus far -tolerating solid diet -no further surgical needs at this point.  No plans or needs for operation.  Continue to treat for ulcer disease but can't say that this is etiology of fevers.  Defer further plans and work up to primary service.  Discussed with attending, Dr. Owens Shark, at bedside.  -we will sign off at this time.  FEN - regular diet VTE - SCDs, subq heparin ID - Cefepime/Flagyl, changed to merrem 5/15   - Per primary -  DM HTN HLD 12 mm cavitary nodule identified right lower lobe R adnexal cyst  I have reviewed 24 hours of vitals, labs, imaging, as well as nursing and medicine notes.   LOS: 6 days    Henreitta Cea , [facility] Surgery 12/28/2021, 9:39 AM Please see Amion for pager number during day hours 7:00am-4:30pm

## 2021-12-30 LAB — CULTURE, BLOOD (ROUTINE X 2)
Culture: NO GROWTH
Culture: NO GROWTH
Special Requests: ADEQUATE

## 2022-01-01 LAB — CULTURE, BLOOD (ROUTINE X 2)
Culture: NO GROWTH
Culture: NO GROWTH
Special Requests: ADEQUATE
Special Requests: ADEQUATE

## 2022-01-02 ENCOUNTER — Emergency Department (HOSPITAL_COMMUNITY): Payer: Medicare Other

## 2022-01-02 ENCOUNTER — Inpatient Hospital Stay (HOSPITAL_COMMUNITY)
Admission: EM | Admit: 2022-01-02 | Discharge: 2022-01-05 | DRG: 441 | Disposition: A | Discharge status: Home Health | Payer: Medicare Other | Attending: Family Medicine | Admitting: Family Medicine

## 2022-01-02 ENCOUNTER — Other Ambulatory Visit: Payer: Self-pay

## 2022-01-02 ENCOUNTER — Encounter (HOSPITAL_COMMUNITY): Payer: Self-pay | Admitting: Pharmacy Technician

## 2022-01-02 DIAGNOSIS — F419 Anxiety disorder, unspecified: Secondary | ICD-10-CM | POA: Diagnosis present

## 2022-01-02 DIAGNOSIS — H903 Sensorineural hearing loss, bilateral: Secondary | ICD-10-CM | POA: Diagnosis present

## 2022-01-02 DIAGNOSIS — R1010 Upper abdominal pain, unspecified: Secondary | ICD-10-CM | POA: Diagnosis not present

## 2022-01-02 DIAGNOSIS — K651 Peritoneal abscess: Secondary | ICD-10-CM | POA: Diagnosis present

## 2022-01-02 DIAGNOSIS — K219 Gastro-esophageal reflux disease without esophagitis: Secondary | ICD-10-CM | POA: Diagnosis present

## 2022-01-02 DIAGNOSIS — Z7984 Long term (current) use of oral hypoglycemic drugs: Secondary | ICD-10-CM

## 2022-01-02 DIAGNOSIS — K279 Peptic ulcer, site unspecified, unspecified as acute or chronic, without hemorrhage or perforation: Secondary | ICD-10-CM

## 2022-01-02 DIAGNOSIS — D849 Immunodeficiency, unspecified: Secondary | ICD-10-CM

## 2022-01-02 DIAGNOSIS — L0291 Cutaneous abscess, unspecified: Secondary | ICD-10-CM | POA: Diagnosis not present

## 2022-01-02 DIAGNOSIS — G4700 Insomnia, unspecified: Secondary | ICD-10-CM | POA: Diagnosis present

## 2022-01-02 DIAGNOSIS — E890 Postprocedural hypothyroidism: Secondary | ICD-10-CM | POA: Diagnosis present

## 2022-01-02 DIAGNOSIS — R935 Abnormal findings on diagnostic imaging of other abdominal regions, including retroperitoneum: Secondary | ICD-10-CM

## 2022-01-02 DIAGNOSIS — Z91013 Allergy to seafood: Secondary | ICD-10-CM

## 2022-01-02 DIAGNOSIS — E1151 Type 2 diabetes mellitus with diabetic peripheral angiopathy without gangrene: Secondary | ICD-10-CM | POA: Diagnosis present

## 2022-01-02 DIAGNOSIS — L508 Other urticaria: Secondary | ICD-10-CM

## 2022-01-02 DIAGNOSIS — Z833 Family history of diabetes mellitus: Secondary | ICD-10-CM

## 2022-01-02 DIAGNOSIS — E785 Hyperlipidemia, unspecified: Secondary | ICD-10-CM

## 2022-01-02 DIAGNOSIS — Z888 Allergy status to other drugs, medicaments and biological substances status: Secondary | ICD-10-CM

## 2022-01-02 DIAGNOSIS — R300 Dysuria: Secondary | ICD-10-CM | POA: Diagnosis present

## 2022-01-02 DIAGNOSIS — I10 Essential (primary) hypertension: Secondary | ICD-10-CM | POA: Diagnosis present

## 2022-01-02 DIAGNOSIS — F319 Bipolar disorder, unspecified: Secondary | ICD-10-CM | POA: Diagnosis present

## 2022-01-02 DIAGNOSIS — Z79890 Hormone replacement therapy (postmenopausal): Secondary | ICD-10-CM | POA: Diagnosis not present

## 2022-01-02 DIAGNOSIS — J984 Other disorders of lung: Secondary | ICD-10-CM | POA: Diagnosis present

## 2022-01-02 DIAGNOSIS — Z818 Family history of other mental and behavioral disorders: Secondary | ICD-10-CM

## 2022-01-02 DIAGNOSIS — Z79899 Other long term (current) drug therapy: Secondary | ICD-10-CM

## 2022-01-02 DIAGNOSIS — E28319 Asymptomatic premature menopause: Secondary | ICD-10-CM

## 2022-01-02 DIAGNOSIS — R197 Diarrhea, unspecified: Secondary | ICD-10-CM | POA: Diagnosis present

## 2022-01-02 DIAGNOSIS — L509 Urticaria, unspecified: Secondary | ICD-10-CM | POA: Diagnosis present

## 2022-01-02 DIAGNOSIS — M549 Dorsalgia, unspecified: Secondary | ICD-10-CM | POA: Diagnosis present

## 2022-01-02 DIAGNOSIS — E782 Mixed hyperlipidemia: Secondary | ICD-10-CM | POA: Diagnosis present

## 2022-01-02 DIAGNOSIS — D84821 Immunodeficiency due to drugs: Secondary | ICD-10-CM | POA: Diagnosis present

## 2022-01-02 DIAGNOSIS — K863 Pseudocyst of pancreas: Secondary | ICD-10-CM | POA: Diagnosis present

## 2022-01-02 DIAGNOSIS — E119 Type 2 diabetes mellitus without complications: Secondary | ICD-10-CM

## 2022-01-02 DIAGNOSIS — Z880 Allergy status to penicillin: Secondary | ICD-10-CM

## 2022-01-02 DIAGNOSIS — L732 Hidradenitis suppurativa: Secondary | ICD-10-CM | POA: Diagnosis present

## 2022-01-02 DIAGNOSIS — K750 Abscess of liver: Secondary | ICD-10-CM | POA: Diagnosis present

## 2022-01-02 DIAGNOSIS — G8929 Other chronic pain: Secondary | ICD-10-CM | POA: Diagnosis present

## 2022-01-02 DIAGNOSIS — Z885 Allergy status to narcotic agent status: Secondary | ICD-10-CM

## 2022-01-02 DIAGNOSIS — R6510 Systemic inflammatory response syndrome (SIRS) of non-infectious origin without acute organ dysfunction: Secondary | ICD-10-CM | POA: Diagnosis not present

## 2022-01-02 DIAGNOSIS — Z87891 Personal history of nicotine dependence: Secondary | ICD-10-CM

## 2022-01-02 DIAGNOSIS — R109 Unspecified abdominal pain: Secondary | ICD-10-CM

## 2022-01-02 DIAGNOSIS — E1369 Other specified diabetes mellitus with other specified complication: Secondary | ICD-10-CM | POA: Diagnosis not present

## 2022-01-02 DIAGNOSIS — Z881 Allergy status to other antibiotic agents status: Secondary | ICD-10-CM

## 2022-01-02 LAB — COMPREHENSIVE METABOLIC PANEL
ALT: 19 U/L (ref 0–44)
AST: 20 U/L (ref 15–41)
Albumin: 2.3 g/dL — ABNORMAL LOW (ref 3.5–5.0)
Alkaline Phosphatase: 105 U/L (ref 38–126)
Anion gap: 7 (ref 5–15)
BUN: 7 mg/dL (ref 6–20)
CO2: 29 mmol/L (ref 22–32)
Calcium: 7.9 mg/dL — ABNORMAL LOW (ref 8.9–10.3)
Chloride: 101 mmol/L (ref 98–111)
Creatinine, Ser: 0.55 mg/dL (ref 0.44–1.00)
GFR, Estimated: >60 mL/min (ref >60)
Glucose, Bld: 90 mg/dL (ref 70–99)
Potassium: 3.6 mmol/L (ref 3.5–5.1)
Sodium: 137 mmol/L (ref 135–145)
Total Bilirubin: 0.7 mg/dL (ref 0.3–1.2)
Total Protein: 6.7 g/dL (ref 6.5–8.1)

## 2022-01-02 LAB — SEDIMENTATION RATE: Sed Rate: 86 mm/hr — ABNORMAL HIGH (ref 0–22)

## 2022-01-02 LAB — CBC WITH DIFFERENTIAL/PLATELET
Abs Immature Granulocytes: 0.06 10*3/uL (ref 0.00–0.07)
Basophils Absolute: 0 10*3/uL (ref 0.0–0.1)
Basophils Relative: 0 %
Eosinophils Absolute: 0 10*3/uL (ref 0.0–0.5)
Eosinophils Relative: 0 %
HCT: 34.2 % — ABNORMAL LOW (ref 36.0–46.0)
Hemoglobin: 11.5 g/dL — ABNORMAL LOW (ref 12.0–15.0)
Immature Granulocytes: 1 %
Lymphocytes Relative: 10 %
Lymphs Abs: 1.2 10*3/uL (ref 0.7–4.0)
MCH: 29.6 pg (ref 26.0–34.0)
MCHC: 33.6 g/dL (ref 30.0–36.0)
MCV: 87.9 fL (ref 80.0–100.0)
Monocytes Absolute: 0.8 10*3/uL (ref 0.1–1.0)
Monocytes Relative: 7 %
Neutro Abs: 9.1 10*3/uL — ABNORMAL HIGH (ref 1.7–7.7)
Neutrophils Relative %: 82 %
Platelets: 463 10*3/uL — ABNORMAL HIGH (ref 150–400)
RBC: 3.89 MIL/uL (ref 3.87–5.11)
RDW: 15.8 % — ABNORMAL HIGH (ref 11.5–15.5)
WBC: 11.2 10*3/uL — ABNORMAL HIGH (ref 4.0–10.5)
nRBC: 0 % (ref 0.0–0.2)

## 2022-01-02 LAB — LIPASE, BLOOD: Lipase: 32 U/L (ref 11–51)

## 2022-01-02 LAB — CBC
HCT: 33.5 % — ABNORMAL LOW (ref 36.0–46.0)
Hemoglobin: 10.9 g/dL — ABNORMAL LOW (ref 12.0–15.0)
MCH: 28.3 pg (ref 26.0–34.0)
MCHC: 32.5 g/dL (ref 30.0–36.0)
MCV: 87 fL (ref 80.0–100.0)
Platelets: 484 10*3/uL — ABNORMAL HIGH (ref 150–400)
RBC: 3.85 MIL/uL — ABNORMAL LOW (ref 3.87–5.11)
RDW: 15.3 % (ref 11.5–15.5)
WBC: 10.9 10*3/uL — ABNORMAL HIGH (ref 4.0–10.5)
nRBC: 0 % (ref 0.0–0.2)

## 2022-01-02 LAB — URINALYSIS, ROUTINE W REFLEX MICROSCOPIC
Bilirubin Urine: NEGATIVE
Glucose, UA: NEGATIVE mg/dL
Ketones, ur: 5 mg/dL — AB
Leukocytes,Ua: NEGATIVE
Nitrite: NEGATIVE
Protein, ur: NEGATIVE mg/dL
Specific Gravity, Urine: 1.008 (ref 1.005–1.030)
pH: 8 (ref 5.0–8.0)

## 2022-01-02 LAB — TSH: TSH: 0.393 u[IU]/mL (ref 0.350–4.500)

## 2022-01-02 LAB — WET PREP, GENITAL
Sperm: NONE SEEN
Trich, Wet Prep: NONE SEEN
WBC, Wet Prep HPF POC: <10 (ref <10)
Yeast Wet Prep HPF POC: NONE SEEN

## 2022-01-02 LAB — CREATININE, SERUM
Creatinine, Ser: 0.52 mg/dL (ref 0.44–1.00)
GFR, Estimated: >60 mL/min (ref >60)

## 2022-01-02 LAB — I-STAT BETA HCG BLOOD, ED (MC, WL, AP ONLY): I-stat hCG, quantitative: <5 m[IU]/mL (ref <5)

## 2022-01-02 LAB — LACTIC ACID, PLASMA: Lactic Acid, Venous: 0.7 mmol/L (ref 0.5–1.9)

## 2022-01-02 LAB — C-REACTIVE PROTEIN: CRP: 33.2 mg/dL — ABNORMAL HIGH (ref <1.0)

## 2022-01-02 LAB — HEMOGLOBIN A1C
Hgb A1c MFr Bld: 6.3 % — ABNORMAL HIGH (ref 4.8–5.6)
Mean Plasma Glucose: 134.11 mg/dL

## 2022-01-02 LAB — MAGNESIUM: Magnesium: 2.1 mg/dL (ref 1.7–2.4)

## 2022-01-02 MED ORDER — SODIUM CHLORIDE 0.9 % IV SOLN
1.0000 g | Freq: Once | INTRAVENOUS | Status: AC
  Administered 2022-01-02: 1 g via INTRAVENOUS
  Filled 2022-01-02: qty 20

## 2022-01-02 MED ORDER — ONDANSETRON HCL 4 MG PO TABS: 4.0000 mg | ORAL_TABLET | Freq: Four times a day (QID) | ORAL | Status: DC | PRN

## 2022-01-02 MED ORDER — ONDANSETRON HCL 4 MG/2ML IJ SOLN: 4.0000 mg | Freq: Four times a day (QID) | INTRAMUSCULAR | Status: DC | PRN

## 2022-01-02 MED ORDER — SUCRALFATE 1 GM/10ML PO SUSP
1.0000 g | Freq: Three times a day (TID) | ORAL | Status: DC
  Administered 2022-01-03 – 2022-01-05 (×7): 1 g via ORAL
  Filled 2022-01-02 (×8): qty 10

## 2022-01-02 MED ORDER — GABAPENTIN 300 MG PO CAPS: 300.0000 mg | ORAL_CAPSULE | Freq: Every day | ORAL | Status: DC | PRN

## 2022-01-02 MED ORDER — HYDROMORPHONE HCL 1 MG/ML IJ SOLN
0.5000 mg | INTRAMUSCULAR | Status: DC | PRN
  Administered 2022-01-02 – 2022-01-04 (×6): 0.5 mg via INTRAVENOUS
  Filled 2022-01-02 (×6): qty 0.5

## 2022-01-02 MED ORDER — LACTATED RINGERS IV BOLUS: 1000.0000 mL | Freq: Once | INTRAVENOUS | Status: DC

## 2022-01-02 MED ORDER — CYCLOSPORINE 0.05 % OP EMUL: 1.0000 [drp] | Freq: Every day | OPHTHALMIC | Status: DC | PRN

## 2022-01-02 MED ORDER — PROGESTERONE MICRONIZED 100 MG PO CAPS
100.0000 mg | ORAL_CAPSULE | Freq: Every day | ORAL | Status: DC
Start: 2022-01-02 — End: 2022-01-03
  Filled 2022-01-02 (×3): qty 1

## 2022-01-02 MED ORDER — HYDROXYZINE HCL 25 MG PO TABS: 25.0000 mg | ORAL_TABLET | Freq: Four times a day (QID) | ORAL | Status: DC | PRN

## 2022-01-02 MED ORDER — ESTRADIOL 0.05 MG/24HR TD PTWK
0.0500 mg | MEDICATED_PATCH | TRANSDERMAL | Status: DC
  Filled 2022-01-02 (×2): qty 1

## 2022-01-02 MED ORDER — ACETAMINOPHEN 650 MG RE SUPP: 650.0000 mg | Freq: Four times a day (QID) | RECTAL | Status: DC | PRN

## 2022-01-02 MED ORDER — ENOXAPARIN SODIUM 40 MG/0.4ML IJ SOSY
40.0000 mg | PREFILLED_SYRINGE | INTRAMUSCULAR | Status: DC
  Administered 2022-01-02 – 2022-01-04 (×3): 40 mg via SUBCUTANEOUS
  Filled 2022-01-02 (×3): qty 0.4

## 2022-01-02 MED ORDER — DIAZEPAM 5 MG PO TABS
5.0000 mg | ORAL_TABLET | Freq: Three times a day (TID) | ORAL | Status: DC
  Administered 2022-01-02 – 2022-01-05 (×9): 5 mg via ORAL
  Filled 2022-01-02 (×9): qty 1

## 2022-01-02 MED ORDER — ACETAMINOPHEN 325 MG PO TABS: 650.0000 mg | ORAL_TABLET | Freq: Four times a day (QID) | ORAL | Status: DC | PRN

## 2022-01-02 MED ORDER — ROSUVASTATIN CALCIUM 5 MG PO TABS
5.0000 mg | ORAL_TABLET | Freq: Every day | ORAL | Status: DC
  Administered 2022-01-02 – 2022-01-05 (×4): 5 mg via ORAL
  Filled 2022-01-02 (×5): qty 1

## 2022-01-02 MED ORDER — PANTOPRAZOLE SODIUM 40 MG IV SOLR
40.0000 mg | Freq: Two times a day (BID) | INTRAVENOUS | Status: DC
  Administered 2022-01-02 – 2022-01-05 (×6): 40 mg via INTRAVENOUS
  Filled 2022-01-02 (×6): qty 10

## 2022-01-02 MED ORDER — HYDROMORPHONE HCL 1 MG/ML IJ SOLN
1.0000 mg | Freq: Once | INTRAMUSCULAR | Status: AC
  Administered 2022-01-02: 1 mg via INTRAVENOUS
  Filled 2022-01-02: qty 1

## 2022-01-02 MED ORDER — IOHEXOL 300 MG/ML  SOLN
100.0000 mL | Freq: Once | INTRAMUSCULAR | Status: AC | PRN
  Administered 2022-01-02: 100 mL via INTRAVENOUS

## 2022-01-02 MED ORDER — HYDROCODONE-ACETAMINOPHEN 5-325 MG PO TABS
1.0000 | ORAL_TABLET | ORAL | Status: DC | PRN
  Administered 2022-01-03: 1 via ORAL
  Filled 2022-01-02: qty 1

## 2022-01-02 MED ORDER — FENTANYL CITRATE PF 50 MCG/ML IJ SOSY
100.0000 ug | PREFILLED_SYRINGE | Freq: Once | INTRAMUSCULAR | Status: AC
  Administered 2022-01-02: 100 ug via INTRAVENOUS
  Filled 2022-01-02: qty 2

## 2022-01-02 MED ORDER — LACTATED RINGERS IV BOLUS
1000.0000 mL | Freq: Once | INTRAVENOUS | Status: AC
Start: 2022-01-02 — End: 2022-01-02
  Administered 2022-01-02: 1000 mL via INTRAVENOUS

## 2022-01-02 MED ORDER — LACTATED RINGERS IV SOLN: INTRAVENOUS | Status: DC

## 2022-01-02 MED ORDER — NICOTINE 14 MG/24HR TD PT24
14.0000 mg | MEDICATED_PATCH | Freq: Every day | TRANSDERMAL | Status: DC
  Administered 2022-01-02 – 2022-01-05 (×4): 14 mg via TRANSDERMAL
  Filled 2022-01-02 (×4): qty 1

## 2022-01-02 MED ORDER — SODIUM CHLORIDE (PF) 0.9 % IJ SOLN
INTRAMUSCULAR | Status: AC
  Filled 2022-01-02: qty 50

## 2022-01-02 NOTE — ED Provider Notes (Signed)
4:19 PM Care assumed from Dr. Doren Custard.  At time of transfer care, patient is awaiting for consultation and disposition.  Patient found to have CT scan showing multiple abscesses.  She is currently on outpatient linezolid and Keflex and presents today for worsened pain.  She just left the hospital AGAINST MEDICAL ADVICE 1 week ago.  As she was seen by infectious disease who help guide her care and these new areas of abscess are developing, will discuss with infectious disease their recommendations and who else needs to be consulted to get involved in her care.  4:33 PM Just spoke to infectious disease who recommended admission to medicine service at [facility] so that there would be multiple consulting specialties available if needed to get involved.  Infectious he is recommended having the hospitalist team consult IR for possible biopsy of the liver lesion and drain placement if needed.  They felt that medicine can then determine if other specialist need to involve such as GI for the liver abscess, OB/GYN for the possible TOA, and general surgery for the other rectal abscess.  They also felt that pulmonary could also weigh in on the lung cavitary lesion if needed however they felt that getting administered into medicine is the most pressing matter so that IR can be involved as well.  When I asked the infectious he seemed about the consults, they felt that this was likely all the same process so that doing the separate consults would likely not be needed at this time.  Infectious disease is recommended for bottles of blood cultures.  They recommended cefepime and Flagyl initially and vancomycin if medicine felt was needed as well.  We will try to call IR first and see if they would like to do the drain and/or biopsy before starting the IV antibiotics.  Patient is currently on the linezolid and Keflex as an outpatient already  Will call for admission.   Clinical Impression: 1. Abdominal pain, unspecified  abdominal location   2. Diarrhea, unspecified type     Disposition: Admit  This note was prepared with assistance of Dragon voice recognition software. Occasional wrong-word or sound-a-like substitutions may have occurred due to the inherent limitations of voice recognition software.     Crystal Mora, Gwenyth Allegra, MD 01/02/22 2131

## 2022-01-02 NOTE — H&P (Addendum)
History and Physical    Crystal Mora IRW:[id number] DOB: [birth year] DOA: 01/02/2022  PCP: Kathreen Devoid, PA-C  Patient coming from: home  I have personally briefly reviewed patient's old medical records in [place]  Chief Complaint: malaise, night sweats, fevers, abdominal pain, diarrhea  HPI: Crystal Mora is Crystal Mora 46 y.o. female with medical history significant of hidratenitis on humira, chronic urticaria on xolair/plaquenil, mood disorder, hypertension, thyroid disease who presents with malaise, fever, night sweats, abdominal pain, diarrhea.   She was recently admitted from 5/12-5/18 with the same issues.  She left AMA, ultimately on keflex and linezolid.  During that admission she was found to have Chris Narasimhan cavitary lung lesion which was evaluated by pulmonology.  GI and surgery were consulted for Shontelle Muska posterior contained perforated gastric ulcer and pancreatic lesion.  No surgical interventions were planned by surgery.  GI planned outpatient follow up with eventual endoscopic evaluation of antral wall thickening and PUD, plan for EGD in 6-8 weeks to document ulcer healing and evaluate for H pylori, etc.  They planned for outpatient MRI pancrease protocol and possible EUS.  She was recommended to be on high dose PPI and carafate.  ID had been consulted during the hospitalization as well with recurrent fevers.  She left AMA, frustrated ultimately with prolonged hospitalization and noting that her daughters birthday was around that time as well.    She notes since leaving, she's felt bad.  She's been bedbound with unchanged symptoms.  Night sweats, fevers, diarrhea (2-3 times Resean Brander day, no blood).  She notes SOB with activity, no orthopnea.  Notes epigastric pain that goes straight through to her back, sharp, coming and going.  Notes pelvic pain that moves to her right and feels like Laporcha Marchesi sharp bear hug.  She notes dysuria.  No vaginal discharge.  No sore throat.  Smokes cigars.  No drinking.   No other drugs, no IV drugs.  ED Course: labs, imaging.  Discussed with [facility], [facility].    Review of Systems: As per HPI otherwise all other systems reviewed and are negative.  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 Her smoking use included cigars and cigarettes. She smoked an average of .5 packs per day. She has never used smokeless tobacco. She reports current alcohol use. She reports that she does not use drugs.  Allergies  Allergen Reactions   Biaxin [Clarithromycin]    Morphine And Related Hives    Says it is okay with benadryl administration   Penicillins Hives, Itching and Swelling    Can tolerate cephalosporins    Percocet [Oxycodone-Acetaminophen] Hives and Itching   Shrimp [Shellfish Allergy]    Citalopram Anxiety    aggression   Topiramate Rash    Family History  Problem Relation Age of Onset   Depression Mother    Mental illness Mother    Asthma  Mother    COPD Mother    Diabetes Mother    Mental illness Brother    Mental illness Daughter    Diabetes Maternal Aunt    Mental illness Maternal Aunt    Depression Maternal Aunt    Diabetes Maternal Grandmother    Prior to Admission medications   Medication Sig Start Date End Date Taking? Authorizing Provider  amLODipine (NORVASC) 2.5 MG tablet Take 2.5 mg by mouth daily. 12/11/21  Yes [provider]  celecoxib (CELEBREX) 100 MG capsule Take 100 mg by mouth 2 (two) times daily. 01/02/22  Yes [provider]  cephALEXin (KEFLEX) 500  MG capsule Take 1 capsule (500 mg total) by mouth 2 (two) times daily for 10 days. 12/28/21 01/07/22 Yes Ezequiel Essex, MD  cetirizine (ZYRTEC) 10 MG tablet Take 10 mg by mouth daily.   Yes [provider]  diazepam (VALIUM) 5 MG tablet Take 5 mg by mouth in the morning, at noon, and at bedtime.   Yes [provider]  EPINEPHrine (EPIPEN 2-PAK) 0.3 mg/0.3 mL IJ SOAJ injection Inject 0.3 mLs (0.3 mg total) into the muscle once as needed for up to 1 dose (for severe allergic reaction). CAll 911 immediately if you have to use this medicine 09/24/18  Yes Baird Cancer, Vilinda Blanks, PA-C  estradiol (CLIMARA - DOSED IN MG/24 HR) 0.05 mg/24hr patch Place 0.05 mg onto the skin once [place] week. 11/14/21  Yes [provider]  famotidine (PEPCID) 20 MG tablet Take 1 tablet (20 mg total) by mouth 2 (two) times daily. Patient taking differently: Take 20 mg by mouth daily as needed for heartburn. 02/27/20  Yes Jacqulyn Cane, MD  gabapentin (NEURONTIN) 300 MG capsule Take 300 mg by mouth daily as needed (For feet pain). 01/16/21  Yes [provider]  HUMIRA PEN 80 MG/0.8ML PNKT Inject 80 mg into the skin every 14 (fourteen) days. 12/04/21  Yes [provider]  hydroxychloroquine (PLAQUENIL) 200 MG tablet Take 200 mg by mouth daily. 12/05/21  Yes [provider]  hydrOXYzine (ATARAX/VISTARIL) 25 MG tablet Take 1 every 4-6 hours as needed for itch or hives. (Caution: May cause drowsiness) Patient taking differently: Take 25 mg by mouth every 6 (six) hours as needed for itching. 02/27/20  Yes Jacqulyn Cane, MD  linezolid (ZYVOX) 600 MG tablet Take 1 tablet (600 mg total) by mouth 2 (two) times daily for 10 days. 12/28/21 01/07/22 Yes Ezequiel Essex, MD  losartan (COZAAR) 100 MG tablet Take 1 tablet by mouth daily. 11/07/21  Yes [provider]  methocarbamol (ROBAXIN) 500 MG tablet Take 1 tablet (500 mg total) by mouth 2 (two) times daily. Patient taking differently: Take 500 mg by  mouth every 6 (six) hours as needed for muscle spasms. 12/16/21  Yes Blue, Soijett Symir Mah, PA-C  omalizumab (XOLAIR) 150 MG/ML prefilled syringe Inject 150 mg into the skin every 14 (fourteen) days.   Yes [provider]  pantoprazole (PROTONIX) 20 MG tablet Take 1 tablet (20 mg total) by mouth daily. 12/28/21  Yes Ezequiel Essex, MD  progesterone (PROMETRIUM) 100 MG capsule Take 100 mg by mouth daily. 02/09/21  Yes [provider]  RESTASIS 0.05 % ophthalmic emulsion Place 1 drop into both eyes daily as needed for dry eyes. 12/20/21  Yes [provider]  rosuvastatin (CRESTOR) 5 MG tablet Take 5 mg by mouth daily. 10/13/21  Yes [provider]  VITAMIN D PO Take 1 tablet by mouth daily.   Yes [provider]  lidocaine (LIDODERM) 5 % Place 1 patch onto the skin daily. Remove & Discard patch within 12 hours or as directed by MD Patient not taking: Reported on 01/02/2022 12/16/21   Blue, Soijett Etai Copado, PA-C  sucralfate (CARAFATE) 1 GM/10ML suspension Take 10 mLs (1 g total) by mouth 4 (four) times daily -  with meals and at bedtime. Patient not taking: Reported on 01/02/2022 12/28/21   Ezequiel Essex, MD    Physical Exam: Vitals:   01/02/22 1430 01/02/22 1530 01/02/22 1630 01/02/22 1800  BP: (!) 155/96 (!) 138/93 (!) 154/92 (!) 143/86  Pulse: 100 (!) 105 (!) 106 (!) 102  Resp: (!) 25 (!) 22 (!) 22 (!) 21  Temp:      TempSrc:      SpO2: 96% 95% 93% 95%    Constitutional: NAD, calm, comfortable Vitals:   01/02/22 1430 01/02/22 1530 01/02/22 1630 01/02/22 1800  BP: (!) 155/96 (!) 138/93 (!) 154/92 (!) 143/86  Pulse: 100 (!) 105 (!) 106 (!) 102  Resp: (!) 25 (!) 22 (!) 22 (!) 21  Temp:      TempSrc:      SpO2: 96% 95% 93% 95%   Eyes: PERRL ENMT: Mucous membranes are moist. Neck: normal, supple, no masses,  Respiratory: clear to auscultation bilaterally, no wheezing, no crackles. Normal respiratory effort. N Cardiovascular: Regular rate and rhythm, no murmurs  / rubs / gallops. No extremity edema.  Abdomen: diffuse TTP Musculoskeletal: no clubbing / cyanosis. No joint deformity upper and lower extremities. Good ROM, no contractures. Normal muscle tone.  Skin: no rashes, lesions, ulcers. No induration Neurologic: CN 2-12 grossly intact. Sensation intact.  Moving all extremities.  Psychiatric: Normal judgment and insight. Alert and oriented x 3. Normal mood.   Labs on Admission: I have personally reviewed following labs and imaging studies  CBC: Recent Labs  Lab 12/27/21 0101 12/28/21 0109 01/02/22 1329  WBC 16.3* 10.8* 11.2*  NEUTROABS 15.4* 8.9* 9.1*  HGB 12.5 12.0 11.5*  HCT 36.4 36.1 34.2*  MCV 85.6 86.8 87.9  PLT 217 238 463*    Basic Metabolic Panel: Recent Labs  Lab 12/27/21 0101 12/28/21 0109 01/02/22 1329  NA 134* 138 137  K 3.3* 3.6 3.6  CL 102 105 101  CO2 24 24 29   GLUCOSE 97 83 90  BUN <5* <5* 7  CREATININE 0.69 0.64 0.55  CALCIUM 7.9* 8.1* 7.9*  MG  --   --  2.1    GFR: Estimated Creatinine Clearance: 92.8 mL/min (by C-G formula based on SCr of 0.55 mg/dL).  Liver Function Tests: Recent Labs  Lab 12/27/21 0101 01/02/22 1329  AST 45* 20  ALT 28 19  ALKPHOS 73 105  BILITOT 0.5 0.7  PROT 5.6* 6.7  ALBUMIN 2.1* 2.3*    Urine analysis:    Component Value Date/Time   COLORURINE YELLOW 01/02/2022 1329   APPEARANCEUR CLEAR 01/02/2022 1329   LABSPEC 1.008 01/02/2022 1329   PHURINE 8.0 01/02/2022 1329   GLUCOSEU NEGATIVE 01/02/2022 1329   HGBUR SMALL (Jorah Hua) 01/02/2022 1329   BILIRUBINUR NEGATIVE 01/02/2022 1329   KETONESUR 5 (Amilya Haver) 01/02/2022 1329   PROTEINUR NEGATIVE 01/02/2022 1329   UROBILINOGEN 0.2 05/12/2015 0803   NITRITE NEGATIVE 01/02/2022 1329   LEUKOCYTESUR NEGATIVE 01/02/2022 1329    Radiological Exams on Admission: CT CHEST ABDOMEN PELVIS W CONTRAST  Result Date: 01/02/2022 CLINICAL DATA:  Shortness of breath, gastric abdominal pain. EXAM: CT CHEST, ABDOMEN, AND PELVIS WITH CONTRAST  TECHNIQUE: Multidetector CT imaging  of the chest, abdomen and pelvis was performed following the standard protocol during bolus administration of intravenous contrast. RADIATION DOSE REDUCTION: This exam was performed according to the departmental dose-optimization program which includes automated exposure control, adjustment of the mA and/or kV according to patient size and/or use of iterative reconstruction technique. CONTRAST:  156mL OMNIPAQUE IOHEXOL 300 MG/ML  SOLN COMPARISON:  Dec 25, 2021. FINDINGS: CT CHEST FINDINGS Cardiovascular: No significant vascular findings. Normal heart size. No pericardial effusion. Mediastinum/Nodes: Status post left thyroidectomy. Esophagus is unremarkable. No adenopathy is noted. Lungs/Pleura: No pneumothorax or pleural effusion is noted. Mild left posterior basilar subsegmental atelectasis is noted. Mild right middle lobe atelectasis is noted. 14 x 7 mm pleural based density is noted posteriorly in right lower lobe with cavitation, concerning for possible cavitating pneumonia or neoplasm. Musculoskeletal: No chest wall mass or suspicious bone lesions identified. CT ABDOMEN PELVIS FINDINGS Hepatobiliary: No gallstones or biliary dilatation is noted. 6.6 x 4.9 cm subcapsular fluid collection is seen along the inferior aspect of the left hepatic lobe which is significantly increased since prior exam and concerning for possible abscess. Pancreas: Pancreatic body is mildly thickened suggesting acute pancreatitis, with probable small pseudo cyst that measures 8 mm currently, which is decreased compared to prior exam. No ductal dilatation is noted. Spleen: Normal in size without focal abnormality. Adrenals/Urinary Tract: Adrenal glands are unremarkable. Kidneys are normal, without renal calculi, focal lesion, or hydronephrosis. Bladder is unremarkable. Stomach/Bowel: There is wall and fold thickening involving the distal stomach concerning for peptic ulcer disease. No abnormal large or  small bowel dilatation is noted. The appendix is unremarkable. Vascular/Lymphatic: No definite vascular abnormality is noted. Mildly enlarged periaortic lymph nodes are noted which most likely are inflammatory in etiology. Reproductive: Uterus is unremarkable. Left adnexal region is unremarkable. There is now noted multiple fluid collections in the right adnexal region, the largest measuring 4.4 x 3.9 cm. This is concerning for possible tubo-ovarian abscess. There is also noted 5.8 x 3.8 cm fluid collection with enhancing margins in the pre rectal space of the pelvis concerning for abscess. Other: No definite hernia is noted. Musculoskeletal: No acute or significant osseous findings. IMPRESSION: 14 x 7 mm pleural based density is noted posteriorly in right lower lobe with cavitation, concerning for possible cavitating pneumonia or necrotic neoplasm. Follow-up CT scan in 2-3 weeks is recommended to ensure resolution or stability. Significantly increased 6.6 x 4.9 cm subcapsular fluid collection along inferior aspect of left hepatic lobe concerning for abscess. There is adjacent wall and fold thickening of the distal stomach concerning for peptic ulcer disease or other inflammation. Also noted are multiple fluid collections in the right side of the pelvis, the largest measuring 4.4 by 3.9 cm, concerning for tubo-ovarian abscess and possible pelvic inflammatory disease. Also noted is 5.8 x 3.8 cm fluid collection with enhancing wall margins in the pre rectal space concerning for abscess. Mild thickening of pancreatic body is noted suggesting acute pancreatitis, with probable small pseudo cyst which is decreased compared to prior exam. Electronically Signed   By: Marijo Conception M.D.   On: 01/02/2022 15:30   DG Chest Portable 1 View  Result Date: 01/02/2022 CLINICAL DATA:  RLL lesion; of breath abdominal pain EXAM: PORTABLE CHEST 1 VIEW COMPARISON:  CT dated Dec 22, 2021; radiograph dated Dec 25, 2021 FINDINGS: The  cardiomediastinal silhouette is unchanged in contour.Unchanged mild elevation of the RIGHT hemidiaphragm. No pleural effusion. No pneumothorax. No acute pleuroparenchymal abnormality. Visualized abdomen is unremarkable. Previously  described cavitary lesion is not visualized radiographically. IMPRESSION: Previously described cavitary lesion is not visualized radiographically. Electronically Signed   By: Valentino Saxon M.D.   On: 01/02/2022 13:44    EKG: Independently reviewed. Sinus tach  Assessment/Plan Principal Problem:   Abscess of multiple sites Active Problems:   Cavitary lesion of lung   Immunosuppressed status (HCC)   SIRS (systemic inflammatory response syndrome) (HCC)   Peptic ulcer disease   Abnormal CT of the abdomen   Diarrhea   Abdominal pain   Chronic urticaria   Hidradenitis suppurativa   Anxiety   Essential hypertension   Premature menopause on hormone replacement therapy   Dyslipidemia   Diabetes (HCC)    Assessment and Plan: * Abscess of multiple sites Left AMA from [facility] last week  Represented with similar symptoms She's immunosuppressed on humira, xolair, and plaquenil  Also, concern for perforated gastric ulcer at last hospitalization CT C/Cassian Torelli/P with possible cavitating pneumonia, 6.6 x 4.9 cm subcapsular fluid collection along inferior aspect of the left hepatic lobe concerning for abscess (adjacent wall and fold thickening of distal stomach concerning for peptic ulcer disease or other inflammation), multiple fluid collections in R side of pelvis concerning for TOA and possible PID, 5.8x3.8 cm fluid collection with wall enhancing margins in prerectal space concerning for abscess, mild thickening of pancreatic body suggesting acute pancreatitis EDP discussed with Infectious Disease who recommended interventional radiology c/s for aspiration/bx of one of lesions with possible drain placement ID recommend IR aspiration, sending aerobic/anaerobic cx, TB/fungal cx,  and cytology Per ED discussion with ID ([id number] EDP note by Dr. Sherry Ruffing), will defer calling gyn, gen surgery, etc right now (but they likely will need to be involved during this hospitalization) Blood cx pending, gc/chlam and wet prep pending CRP 33.2, sed rate 86 Currently she's mildly tachycardic and tachypneic, but appears stable.  Will hold abx for now until IR gets culture (would start overnight if she were to decline).    Cavitary lesion of lung 14x7 mm pleural based density in RLL with cavitation concerning for cavitating pneumonia or necrotic neoplasm Needs f/u CT in 2-3 weeks Negative quant gold 5/12 Seen by pulm, last on 5/18 - they thought most concerning for infection.  TB felt unlikely.   Appreciate ID assistance Consider reconsulting pulm  SIRS (systemic inflammatory response syndrome) (HCC) Meets sirs criteria, working up as above, doesn't look septic clinically Low threshold for restarting abx  Immunosuppressed status ([place]) Noted, on humira for about 2 years for HS.  Also on xolair and plaquenil for chronic urticaria. Hold for now  Peptic ulcer disease CT with wall and fold thickening of distal stomach concerning for PUD or other inflammation At last hospitalization treated for posterior contained perforated gastric ulcer and pancreatic lesion Seen by surgery 5/18 at last hospitalization, no plans for operation  Also seen by GI 5/18 who recommend high dose PPI and carafate with plan for outpatient MRI pancreas protocol as well as possible EUS and EGD in 6-8 weeks to document ulcer healing and eval for H pylori, etc PPI BID, carafate   Abdominal pain Due to above  Diarrhea monitor  Abnormal CT of the abdomen Unclear significance of findings concerning for pancreatitis, as above, planning for outpatient MRI pancreas protocol  Anxiety valium  Hidradenitis suppurativa Holding humira  Chronic urticaria Holding plaquenil and xolair  Essential  hypertension Holding home BP meds for now  Dyslipidemia statin  Premature menopause on hormone replacement therapy Continue HRT for now  Diabetes (  [place]) Chart hx follow A1c    Discussed with Dr. Adah Salvage from [facility] Medicine service, they'll pick her up on 5/24 when she gets [facility]  DVT prophylaxis: lovenox  Code Status:   full  Family Communication:  none  Disposition Plan:   Patient is from:  home  Anticipated DC to:  home  Anticipated DC date:  unknown  Anticipated DC barriers: Pending further improvement and workup  Consults called:  Infectious disease, interventional radiology  Admission status:  inpatient   Severity of Illness: The appropriate patient status for this patient is INPATIENT. Inpatient status is judged to be reasonable and necessary in order to provide the required intensity of service to ensure the patient's safety. The patient's presenting symptoms, physical exam findings, and initial radiographic and laboratory data in the context of their chronic comorbidities is felt to place them at high risk for further clinical deterioration. Furthermore, it is not anticipated that the patient will be medically stable for discharge from the hospital within 2 midnights of admission.   * I certify that at the point of admission it is my clinical judgment that the patient will require inpatient hospital care spanning beyond 2 midnights from the point of admission due to high intensity of service, high risk for further deterioration and high frequency of surveillance required.Fayrene Helper MD [facility]  How to contact the [facility] Attending or Consulting provider [place] or covering provider during after hours [place], for this patient?   Check the care team in [facility] and look for Antoinett Dorman) attending/consulting [facility] provider listed and b) the [facility] team listed Log into [email or web address] and use [place]'s universal password to access. If you do not have the password, please  contact the hospital operator. Locate the [facility] provider you are looking for under [facility] and page to Lasonja Lakins number that you can be directly reached. If you still have difficulty reaching the provider, please page the [facility] (Director on Call) for the Hospitalists listed on amion for assistance.  01/02/2022, 7:01 PM

## 2022-01-02 NOTE — Assessment & Plan Note (Signed)
Continue HRT for now

## 2022-01-02 NOTE — Assessment & Plan Note (Addendum)
Meets sirs criteria, working up as above, doesn't look septic clinically Low threshold for restarting abx

## 2022-01-02 NOTE — Assessment & Plan Note (Signed)
Holding humira

## 2022-01-02 NOTE — Assessment & Plan Note (Addendum)
Left AMA from [facility] last week  Represented with similar symptoms She's immunosuppressed on humira, xolair, and plaquenil  Also, concern for perforated gastric ulcer at last hospitalization CT C/Reeve Mallo/P with possible cavitating pneumonia, 6.6 x 4.9 cm subcapsular fluid collection along inferior aspect of the left hepatic lobe concerning for abscess (adjacent wall and fold thickening of distal stomach concerning for peptic ulcer disease or other inflammation), multiple fluid collections in R side of pelvis concerning for TOA and possible PID, 5.8x3.8 cm fluid collection with wall enhancing margins in prerectal space concerning for abscess, mild thickening of pancreatic body suggesting acute pancreatitis EDP discussed with Infectious Disease who recommended interventional radiology c/s for aspiration/bx of one of lesions with possible drain placement ID recommend IR aspiration, sending aerobic/anaerobic cx, TB/fungal cx, and cytology Per ED discussion with ID ([id number] EDP note by Dr. Sherry Ruffing), will defer calling gyn, gen surgery, etc right now (but they likely will need to be involved during this hospitalization) Blood cx pending, gc/chlam and wet prep pending CRP 33.2, sed rate 86 Currently she's mildly tachycardic and tachypneic, but appears stable.  Will hold abx for now until IR gets culture (would start overnight if she were to decline).

## 2022-01-02 NOTE — Progress Notes (Signed)
A consult was received from an ED physician for Meropenem per pharmacy dosing.  The patient's profile has been reviewed for ht/wt/allergies/indication/available labs.  PCN allergy noted- she has previously tolerated cephalosporins. A one time order has been placed for Meropenem 1gm IV.  Further antibiotics/pharmacy consults should be ordered by admitting physician if indicated.                       Thank you, Netta Cedars PharmD 01/02/2022  4:02 PM

## 2022-01-02 NOTE — Assessment & Plan Note (Signed)
Chart hx follow A1c

## 2022-01-02 NOTE — Assessment & Plan Note (Addendum)
Holding plaquenil and xolair

## 2022-01-02 NOTE — Assessment & Plan Note (Signed)
statin

## 2022-01-02 NOTE — Assessment & Plan Note (Addendum)
14x7 mm pleural based density in RLL with cavitation concerning for cavitating pneumonia or necrotic neoplasm Needs f/u CT in 2-3 weeks Negative quant gold 5/12 Seen by pulm, last on 5/18 - they thought most concerning for infection.  TB felt unlikely.   Appreciate ID assistance Consider reconsulting pulm

## 2022-01-02 NOTE — Assessment & Plan Note (Addendum)
CT with wall and fold thickening of distal stomach concerning for PUD or other inflammation At last hospitalization treated for posterior contained perforated gastric ulcer and pancreatic lesion Seen by surgery 5/18 at last hospitalization, no plans for operation  Also seen by GI 5/18 who recommend high dose PPI and carafate with plan for outpatient MRI pancreas protocol as well as possible EUS and EGD in 6-8 weeks to document ulcer healing and eval for H pylori, etc PPI BID, carafate

## 2022-01-02 NOTE — Assessment & Plan Note (Signed)
Unclear significance of findings concerning for pancreatitis, as above, planning for outpatient MRI pancreas protocol

## 2022-01-02 NOTE — Assessment & Plan Note (Signed)
monitor

## 2022-01-02 NOTE — Assessment & Plan Note (Signed)
Holding home BP meds for now

## 2022-01-02 NOTE — Assessment & Plan Note (Addendum)
Noted, on humira for about 2 years for HS.  Also on xolair and plaquenil for chronic urticaria. Hold for now

## 2022-01-02 NOTE — ED Provider Notes (Signed)
[facility] DEPT Provider Note   CSN: [id number] Arrival date & time: 01/02/22  1207     History  Chief Complaint  Patient presents with   Shortness of Breath   Abdominal Pain    Crystal Mora is a 46 y.o. female.   Shortness of Breath Associated symptoms: abdominal pain and fever   Abdominal Pain Associated symptoms: diarrhea, fatigue, fever and shortness of breath   Patient presenting for epigastric pain, shortness of breath, diarrhea.  Her medical history includes anemia, bipolar disorder, chronic pain, polysubstance abuse, anxiety, GERD, HTN, pancreatic cyst, PVD, cavitary lung lesion.  She had a recent admission on 5/12.  At that time, she presented with chest pain and abdominal pain.  Work-up showed a cavitary nodule in right lower lobe, a complex multicystic lesion on cranial margin of pancreatic body extending to area adjacent to gastric fundus, and mildly enlarged associated lymph nodes.  There was concern of PUD due to her prolonged NSAID and steroid use.  She was treated with bowel rest, PPI, and IV antibiotics.  Antibiotics were initially cefepime, and Flagyl.  Due to nighttime fevers, she was transitioned to Merrem and vancomycin.  Clear source of fevers was unable to be identified.  Patient was in the hospital for 6 days.  GI, pulmonology, general surgery, and infectious disease were consulted.  She grew frustrated of no clear answers to her current condition.  She left AMA on 5/18.  At time of AMA discharge, she was prescribed linezolid and Keflex, which she has been taking.  Since she left the hospital, she endorses continued epigastric discomfort, early satiety, other areas of abdominal pain, fatigue, and exertional shortness of breath.  She has been treating her pain with Norco that was leftover from her previous prescriptions.  She denies any nausea or vomiting.  She has continued to have diarrhea which she describes as watery.  Due to her  early satiety, she has had very little food intake.  She has been drinking fluids.  She continues to have nighttime fevers.    Home Medications Prior to Admission medications   Medication Sig Start Date End Date Taking? Authorizing Provider  amLODipine (NORVASC) 2.5 MG tablet Take 2.5 mg by mouth daily. 12/11/21   [provider]  cephALEXin (KEFLEX) 500 MG capsule Take 1 capsule (500 mg total) by mouth 2 (two) times daily for 10 days. 12/28/21 01/07/22  Ezequiel Essex, MD  cetirizine (ZYRTEC) 10 MG tablet Take 10 mg by mouth daily.    [provider]  cyclobenzaprine (FLEXERIL) 10 MG tablet Take 1 tablet (10 mg total) by mouth every 8 (eight) hours as needed for muscle spasms. 10/19/16   Starr Lake, CNM  diazepam (VALIUM) 5 MG tablet Take 5 mg by mouth in the morning, at noon, and at bedtime.    [provider]  EPINEPHrine (EPIPEN 2-PAK) 0.3 mg/0.3 mL IJ SOAJ injection Inject 0.3 mLs (0.3 mg total) into the muscle once as needed for up to 1 dose (for severe allergic reaction). CAll 911 immediately if you have to use this medicine 09/24/18   Larene Pickett, PA-C  estradiol (CLIMARA - DOSED IN MG/24 HR) 0.05 mg/24hr patch Place 0.05 mg onto the skin once a week. 11/14/21   [provider]  famotidine (PEPCID) 20 MG tablet Take 1 tablet (20 mg total) by mouth 2 (two) times daily. Patient taking differently: Take 20 mg by mouth daily as needed for heartburn. 02/27/20   Burnett Harry,  Shanon Brow, MD  gabapentin (NEURONTIN) 300 MG capsule Take 300 mg by mouth daily as needed (For feet pain). 01/16/21   [provider]  HUMIRA PEN 80 MG/0.8ML PNKT Inject 80 mg into the skin every 14 (fourteen) days. 12/04/21   [provider]  hydroxychloroquine (PLAQUENIL) 200 MG tablet Take 200 mg by mouth daily. 12/05/21   [provider]  hydrOXYzine (ATARAX/VISTARIL) 25 MG tablet Take 1 every 4-6 hours as needed for itch or hives. (Caution: May cause  drowsiness) Patient taking differently: Take 25 mg by mouth every 6 (six) hours as needed for itching. 02/27/20   Jacqulyn Cane, MD  lidocaine (LIDODERM) 5 % Place 1 patch onto the skin daily. Remove & Discard patch within 12 hours or as directed by MD Patient not taking: Reported on 12/22/2021 12/16/21   Blue, Soijett A, PA-C  linezolid (ZYVOX) 600 MG tablet Take 1 tablet (600 mg total) by mouth 2 (two) times daily for 10 days. 12/28/21 01/07/22  Ezequiel Essex, MD  losartan (COZAAR) 100 MG tablet Take 1 tablet by mouth daily. 11/07/21   [provider]  metFORMIN (GLUCOPHAGE) 500 MG tablet Take 500 mg by mouth 2 (two) times daily with a meal.    [provider]  methocarbamol (ROBAXIN) 500 MG tablet Take 1 tablet (500 mg total) by mouth 2 (two) times daily. Patient taking differently: Take 500 mg by mouth every 6 (six) hours as needed for muscle spasms. 12/16/21   Blue, Soijett A, PA-C  mirtazapine (REMERON) 15 MG tablet Take 15 mg by mouth at bedtime. 12/18/21   [provider]  omalizumab Arvid Right) 150 MG/ML prefilled syringe Inject 150 mg into the skin every 14 (fourteen) days.    [provider]  pantoprazole (PROTONIX) 20 MG tablet Take 1 tablet (20 mg total) by mouth daily. 12/28/21   Ezequiel Essex, MD  progesterone (PROMETRIUM) 100 MG capsule Take 100 mg by mouth daily. 02/09/21   [provider]  RESTASIS 0.05 % ophthalmic emulsion Place 1 drop into both eyes daily as needed for dry eyes. 12/20/21   [provider]  rosuvastatin (CRESTOR) 5 MG tablet Take 1 tablet by mouth daily. 10/13/21   [provider]  spironolactone (ALDACTONE) 50 MG tablet Take 50 mg by mouth daily. Patient not taking: Reported on 12/22/2021 06/09/21   [provider]  sucralfate (CARAFATE) 1 GM/10ML suspension Take 10 mLs (1 g total) by mouth 4 (four) times daily -  with meals and at bedtime. 12/28/21   Ezequiel Essex, MD      Allergies    Biaxin  [clarithromycin], Morphine and related, Penicillins, Percocet [oxycodone-acetaminophen], Shrimp [shellfish allergy], Citalopram, and Topiramate    Review of Systems   Review of Systems  Constitutional:  Positive for fatigue and fever.  Respiratory:  Positive for shortness of breath.   Gastrointestinal:  Positive for abdominal distention, abdominal pain and diarrhea.  All other systems reviewed and are negative.  Physical Exam Updated Vital Signs BP (!) 154/92   Pulse (!) 106   Temp 98.2 F (36.8 C) (Oral)   Resp (!) 22   SpO2 93%  Physical Exam Vitals and nursing note reviewed. Exam conducted with a chaperone present.  Constitutional:      General: She is not in acute distress.    Appearance: She is well-developed. She is not ill-appearing, toxic-appearing or diaphoretic.  HENT:     Head: Normocephalic and atraumatic.     Mouth/Throat:     Mouth: Mucous  membranes are moist.     Pharynx: Oropharynx is clear.  Eyes:     Extraocular Movements: Extraocular movements intact.     Conjunctiva/sclera: Conjunctivae normal.  Cardiovascular:     Rate and Rhythm: Normal rate and regular rhythm.     Heart sounds: No murmur heard. Pulmonary:     Effort: Pulmonary effort is normal. No tachypnea or respiratory distress.     Breath sounds: No wheezing, rhonchi or rales.  Abdominal:     Palpations: Abdomen is soft.     Tenderness: There is abdominal tenderness in the right upper quadrant, right lower quadrant, epigastric area, suprapubic area and left lower quadrant. There is no right CVA tenderness, left CVA tenderness, guarding or rebound.  Genitourinary:    General: Normal vulva.     Exam position: Lithotomy position.     Pubic Area: No rash.      Labia:        Right: No lesion.        Left: No lesion.      Vagina: No foreign body. No bleeding or lesions.     Cervix: Discharge (Scant clear-white discharge) and erythema (Erythematous region at 7 o'clock position) present. No cervical  motion tenderness or cervical bleeding.     Rectum: Normal. No mass, tenderness, external hemorrhoid or internal hemorrhoid.  Musculoskeletal:        General: No swelling. Normal range of motion.     Cervical back: Normal range of motion and neck supple.     Right lower leg: No edema.     Left lower leg: No edema.  Skin:    General: Skin is warm and dry.     Capillary Refill: Capillary refill takes less than 2 seconds.  Neurological:     General: No focal deficit present.     Mental Status: She is alert and oriented to person, place, and time.     Cranial Nerves: No cranial nerve deficit.     Motor: No weakness.  Psychiatric:        Mood and Affect: Mood normal.        Behavior: Behavior normal.    ED Results / Procedures / Treatments   Labs (all labs ordered are listed, but only abnormal results are displayed) Labs Reviewed  COMPREHENSIVE METABOLIC PANEL - Abnormal; Notable for the following components:      Result Value   Calcium 7.9 (*)    Albumin 2.3 (*)    All other components within normal limits  CBC WITH DIFFERENTIAL/PLATELET - Abnormal; Notable for the following components:   WBC 11.2 (*)    Hemoglobin 11.5 (*)    HCT 34.2 (*)    RDW 15.8 (*)    Platelets 463 (*)    Neutro Abs 9.1 (*)    All other components within normal limits  URINALYSIS, ROUTINE W REFLEX MICROSCOPIC - Abnormal; Notable for the following components:   Hgb urine dipstick SMALL (*)    Ketones, ur 5 (*)    Bacteria, UA RARE (*)    All other components within normal limits  SEDIMENTATION RATE - Abnormal; Notable for the following components:   Sed Rate 86 (*)    All other components within normal limits  WET PREP, GENITAL  CULTURE, BLOOD (ROUTINE X 2)  CULTURE, BLOOD (ROUTINE X 2)  CULTURE, BLOOD (ROUTINE X 2)  CULTURE, BLOOD (ROUTINE X 2)  LIPASE, BLOOD  MAGNESIUM  LACTIC ACID, PLASMA  TSH  C-REACTIVE PROTEIN  I-STAT BETA HCG  BLOOD, ED ([facility], [facility], [facility] ONLY)  GC/CHLAMYDIA PROBE AMP ([facility]) NOT AT [facility]    EKG EKG Interpretation  Date/Time:  Tuesday Jan 02 2022 13:44:01 EDT Ventricular Rate:  103 PR Interval:  140 QRS Duration: 85 QT Interval:  352 QTC Calculation: 461 R Axis:   42 Text Interpretation: Sinus tachycardia Confirmed by Godfrey Pick [phone]) on 01/02/2022 3:52:16 PM  Radiology CT CHEST ABDOMEN PELVIS W CONTRAST  Result Date: 01/02/2022 CLINICAL DATA:  Shortness of breath, gastric abdominal pain. EXAM: CT CHEST, ABDOMEN, AND PELVIS WITH CONTRAST TECHNIQUE: Multidetector CT imaging of the chest, abdomen and pelvis was performed following the standard protocol during bolus administration of intravenous contrast. RADIATION DOSE REDUCTION: This exam was performed according to the departmental dose-optimization program which includes automated exposure control, adjustment of the mA and/or kV according to patient size and/or use of iterative reconstruction technique. CONTRAST:  121m OMNIPAQUE IOHEXOL 300 MG/ML  SOLN COMPARISON:  Dec 25, 2021. FINDINGS: CT CHEST FINDINGS Cardiovascular: No significant vascular findings. Normal heart size. No pericardial effusion. Mediastinum/Nodes: Status post left thyroidectomy. Esophagus is unremarkable. No adenopathy is noted. Lungs/Pleura: No pneumothorax or pleural effusion is noted. Mild left posterior basilar subsegmental atelectasis is noted. Mild right middle lobe atelectasis is noted. 14 x 7 mm pleural based density is noted posteriorly in right lower lobe with cavitation, concerning for possible cavitating pneumonia or neoplasm. Musculoskeletal: No chest wall mass or suspicious bone lesions identified. CT ABDOMEN PELVIS FINDINGS Hepatobiliary: No gallstones or biliary dilatation is noted. 6.6 x 4.9 cm subcapsular fluid collection is seen along the inferior aspect of the left hepatic lobe which is significantly increased since prior exam and concerning for possible abscess. Pancreas: Pancreatic body is mildly thickened suggesting  acute pancreatitis, with probable small pseudo cyst that measures 8 mm currently, which is decreased compared to prior exam. No ductal dilatation is noted. Spleen: Normal in size without focal abnormality. Adrenals/Urinary Tract: Adrenal glands are unremarkable. Kidneys are normal, without renal calculi, focal lesion, or hydronephrosis. Bladder is unremarkable. Stomach/Bowel: There is wall and fold thickening involving the distal stomach concerning for peptic ulcer disease. No abnormal large or small bowel dilatation is noted. The appendix is unremarkable. Vascular/Lymphatic: No definite vascular abnormality is noted. Mildly enlarged periaortic lymph nodes are noted which most likely are inflammatory in etiology. Reproductive: Uterus is unremarkable. Left adnexal region is unremarkable. There is now noted multiple fluid collections in the right adnexal region, the largest measuring 4.4 x 3.9 cm. This is concerning for possible tubo-ovarian abscess. There is also noted 5.8 x 3.8 cm fluid collection with enhancing margins in the pre rectal space of the pelvis concerning for abscess. Other: No definite hernia is noted. Musculoskeletal: No acute or significant osseous findings. IMPRESSION: 14 x 7 mm pleural based density is noted posteriorly in right lower lobe with cavitation, concerning for possible cavitating pneumonia or necrotic neoplasm. Follow-up CT scan in 2-3 weeks is recommended to ensure resolution or stability. Significantly increased 6.6 x 4.9 cm subcapsular fluid collection along inferior aspect of left hepatic lobe concerning for abscess. There is adjacent wall and fold thickening of the distal stomach concerning for peptic ulcer disease or other inflammation. Also noted are multiple fluid collections in the right side of the pelvis, the largest measuring 4.4 by 3.9 cm, concerning for tubo-ovarian abscess and possible pelvic inflammatory disease. Also noted is 5.8 x 3.8 cm fluid collection with enhancing  wall margins in the pre rectal space concerning for abscess. Mild thickening  of pancreatic body is noted suggesting acute pancreatitis, with probable small pseudo cyst which is decreased compared to prior exam. Electronically Signed   By: Marijo Conception M.D.   On: 01/02/2022 15:30   DG Chest Portable 1 View  Result Date: 01/02/2022 CLINICAL DATA:  RLL lesion; of breath abdominal pain EXAM: PORTABLE CHEST 1 VIEW COMPARISON:  CT dated Dec 22, 2021; radiograph dated Dec 25, 2021 FINDINGS: The cardiomediastinal silhouette is unchanged in contour.Unchanged mild elevation of the RIGHT hemidiaphragm. No pleural effusion. No pneumothorax. No acute pleuroparenchymal abnormality. Visualized abdomen is unremarkable. Previously described cavitary lesion is not visualized radiographically. IMPRESSION: Previously described cavitary lesion is not visualized radiographically. Electronically Signed   By: Valentino Saxon M.D.   On: 01/02/2022 13:44    Procedures Procedures    Medications Ordered in ED Medications  sodium chloride (PF) 0.9 % injection (has no administration in time range)  meropenem (MERREM) 1 g in sodium chloride 0.9 % 100 mL IVPB (1 g Intravenous New Bag/Given 01/02/22 1624)  fentaNYL (SUBLIMAZE) injection 100 mcg (100 mcg Intravenous Given 01/02/22 1340)  lactated ringers bolus 1,000 mL (0 mLs Intravenous Stopped 01/02/22 1441)  HYDROmorphone (DILAUDID) injection 1 mg (1 mg Intravenous Given 01/02/22 1441)  iohexol (OMNIPAQUE) 300 MG/ML solution 100 mL (100 mLs Intravenous Contrast Given 01/02/22 1501)    ED Course/ Medical Decision Making/ A&P                           Medical Decision Making Amount and/or Complexity of Data Reviewed Labs: ordered. Radiology: ordered.  Risk Prescription drug management.   This patient presents to the ED for concern of abdominal pain, diarrhea, fevers, this involves an extensive number of treatment options, and is a complaint that carries with it a  high risk of complications and morbidity.  The differential diagnosis includes infection, sepsis, constipation, pancreatitis, PUD   Co morbidities that complicate the patient evaluation  anemia, bipolar disorder, chronic pain, polysubstance abuse, anxiety, GERD, HTN, pancreatic cyst, PVD, cavitary lung lesion   Additional history obtained:  Additional history obtained from patient's daughter External records from outside source obtained and reviewed including EMR   Lab Tests:  I Ordered, and personally interpreted labs.  The pertinent results include: Mild leukocytosis, persistent elevated ESR, normal lactate, normal electrolytes, worsening anemia, UA negative for UTI   Imaging Studies ordered:  I ordered imaging studies including chest x-ray, CT of chest, abdomen, and pelvis I independently visualized and interpreted imaging which showed multiple fluid collections concerning for abscesses: Inferior aspect of left hepatic lobe, right-sided pelvis concerning for TOA, prerectal space.  There is also redemonstration of pleural-based density with right lower lobe cavitation concerning for cavitating pneumonia or necrotic neoplasm; wall thickening of distal stomach concerning for PUD; mild thickening of pancreatic body with decreased size of previously identified small pseudocyst I agree with the radiologist interpretation   Cardiac Monitoring: / EKG:  The patient was maintained on a cardiac monitor.  I personally viewed and interpreted the cardiac monitored which showed an underlying rhythm of: Sinus rhythm   Consultations Obtained:  Consultations required with multiple specialties.  This was signed out to oncoming ED provider.   Problem List / ED Course / Critical interventions / Medication management  Patient is a 46 year old female presenting for persistent abdominal pain, continued nightly fevers, and continued diarrhea.  She is tachycardic on arrival.  She is currently  afebrile.  On exam, she is alert  and oriented.  Breathing is unlabored and SPO2 is normal on room air.  Her abdomen does seem mildly distended.  Her multiple areas of tenderness, notably right upper quadrant, right lower quadrant, left lower quadrant and mid abdomen.  I reviewed chart regarding her recent hospital stay.  At time of AMA discharge, she was on Merrem and vancomycin.  She was discharged on linezolid and Keflex.  She has been taking these oral antibiotics at home.  Lab work was initiated.  Patient was given IV fluids for her decreased p.o. intake and current tachycardia.  Fentanyl was given for analgesia.  On reassessment, patient endorsed continued severe pain.  Dilaudid was ordered.  Lab work is notable for mild leukocytosis, and persistent elevated sed rate.  CT scan of chest, abdomen, and pelvis was ordered.  Results showed multiple new findings.  Notably these are fluid collections and areas of liver, right adnexum, and prerectal space.  Fluid collections concerning for possible abscesses.  Patient was restarted on IV antibiotics.  Given concern for TOA/or prerectal abscess, rectal and pelvic exam were performed.  GC chlamydia and wet prep swabs were sent.  Patient will require multiple specialty consultation.  She will require admission.  Care of patient signed out to oncoming ED provider. I ordered medication including IV fluids for dehydration; fentanyl and Dilaudid for analgesia, meropenem for treatment of presumed infection with multiple fluid collections identified on CT scan concerning for abscesses. Reevaluation of the patient after these medicines showed that the patient improved I have reviewed the patients home medicines and have made adjustments as needed   Social Determinants of Health:  Lives independently         Final Clinical Impression(s) / ED Diagnoses Final diagnoses:  Abdominal pain, unspecified abdominal location  Diarrhea, unspecified type    Rx / DC  Orders ED Discharge Orders     None         Godfrey Pick, MD 01/02/22 1639

## 2022-01-02 NOTE — ED Notes (Signed)
Day shift called care link and put in for pick up -

## 2022-01-02 NOTE — Assessment & Plan Note (Signed)
Due to above

## 2022-01-02 NOTE — Assessment & Plan Note (Signed)
valium

## 2022-01-02 NOTE — ED Triage Notes (Signed)
Pt here with epigastric pain, RUQ pain, shob and diarrhea for the last week. Was admitted to [facility] and left on the 17th AMA. Pt states since then worsening pain.

## 2022-01-03 ENCOUNTER — Inpatient Hospital Stay (HOSPITAL_COMMUNITY): Payer: Medicare Other

## 2022-01-03 DIAGNOSIS — R935 Abnormal findings on diagnostic imaging of other abdominal regions, including retroperitoneum: Secondary | ICD-10-CM

## 2022-01-03 DIAGNOSIS — R6510 Systemic inflammatory response syndrome (SIRS) of non-infectious origin without acute organ dysfunction: Secondary | ICD-10-CM

## 2022-01-03 DIAGNOSIS — R1010 Upper abdominal pain, unspecified: Secondary | ICD-10-CM

## 2022-01-03 LAB — GC/CHLAMYDIA PROBE AMP (~~LOC~~) NOT AT ARMC
Chlamydia: NEGATIVE
Comment: NEGATIVE
Comment: NORMAL
Neisseria Gonorrhea: NEGATIVE

## 2022-01-03 MED ORDER — METRONIDAZOLE 500 MG PO TABS
500.0000 mg | ORAL_TABLET | Freq: Two times a day (BID) | ORAL | Status: DC
  Administered 2022-01-03 – 2022-01-05 (×5): 500 mg via ORAL
  Filled 2022-01-03 (×5): qty 1

## 2022-01-03 MED ORDER — MIDAZOLAM HCL 2 MG/2ML IJ SOLN
INTRAMUSCULAR | Status: AC | PRN
  Administered 2022-01-03: .5 mg via INTRAVENOUS
  Administered 2022-01-03: 1 mg via INTRAVENOUS

## 2022-01-03 MED ORDER — FENTANYL CITRATE (PF) 100 MCG/2ML IJ SOLN
INTRAMUSCULAR | Status: AC
  Filled 2022-01-03: qty 4

## 2022-01-03 MED ORDER — SODIUM CHLORIDE 0.9% FLUSH
10.0000 mL | Freq: Three times a day (TID) | INTRAVENOUS | Status: DC
  Administered 2022-01-03 – 2022-01-05 (×6): 10 mL

## 2022-01-03 MED ORDER — HYDROCODONE-ACETAMINOPHEN 5-325 MG PO TABS
1.0000 | ORAL_TABLET | ORAL | Status: DC | PRN
  Administered 2022-01-03: 1 via ORAL
  Administered 2022-01-03 – 2022-01-04 (×5): 2 via ORAL
  Administered 2022-01-05 (×2): 1 via ORAL
  Administered 2022-01-05: 2 via ORAL
  Administered 2022-01-05: 1 via ORAL
  Filled 2022-01-03 (×2): qty 2
  Filled 2022-01-03: qty 1
  Filled 2022-01-03 (×2): qty 2
  Filled 2022-01-03: qty 1
  Filled 2022-01-03 (×3): qty 2
  Filled 2022-01-03: qty 1

## 2022-01-03 MED ORDER — OXYCODONE HCL 5 MG PO TABS: 5.0000 mg | ORAL_TABLET | ORAL | Status: DC | PRN

## 2022-01-03 MED ORDER — ACETAMINOPHEN 650 MG RE SUPP: 650.0000 mg | Freq: Four times a day (QID) | RECTAL | Status: DC | PRN

## 2022-01-03 MED ORDER — HYDROMORPHONE HCL 1 MG/ML IJ SOLN
INTRAMUSCULAR | Status: AC
  Filled 2022-01-03: qty 1

## 2022-01-03 MED ORDER — ACETAMINOPHEN 500 MG PO TABS: 1000.0000 mg | ORAL_TABLET | Freq: Four times a day (QID) | ORAL | Status: DC | PRN

## 2022-01-03 MED ORDER — SODIUM CHLORIDE 0.9 % IV SOLN
2.0000 g | INTRAVENOUS | Status: DC
  Administered 2022-01-03 – 2022-01-05 (×3): 2 g via INTRAVENOUS
  Filled 2022-01-03 (×3): qty 20

## 2022-01-03 MED ORDER — MIDAZOLAM HCL 2 MG/2ML IJ SOLN
INTRAMUSCULAR | Status: AC
  Filled 2022-01-03: qty 4

## 2022-01-03 MED ORDER — FENTANYL CITRATE (PF) 100 MCG/2ML IJ SOLN
INTRAMUSCULAR | Status: AC | PRN
  Administered 2022-01-03: 25 ug via INTRAVENOUS
  Administered 2022-01-03: 50 ug via INTRAVENOUS

## 2022-01-03 MED ORDER — LIDOCAINE HCL 1 % IJ SOLN
INTRAMUSCULAR | Status: AC
  Filled 2022-01-03: qty 10

## 2022-01-03 NOTE — Progress Notes (Signed)
Mobility Specialist Progress Note   01/03/22 1700  Mobility  Activity Ambulated with assistance in hallway  Level of Assistance Standby assist, set-up cues, supervision of patient - no hands on  Assistive Device  (IV Pole)  Distance Ambulated (ft) 196 ft  Activity Response Tolerated well  $Mobility charge 1 Mobility   Pre Mobility: 103 HR, 158/101 BP, 97% SpO2  Received in bed c/o incisional pain(9/10) but agreeable. Pt's BP was elevated but had no symptoms and was inclined to walk. Ambulated w/o fault but session cut short d/t RN requesting urgent services in another room. RN took over ambulation session and got pt safely back to bed. Notified RN of BP after dealing w/ urgent situation.  Holland Falling Mobility Specialist Phone Number [phone]

## 2022-01-03 NOTE — Procedures (Signed)
Interventional Radiology Procedure Note  Procedure: CT Guided Drainage of hepatic abscess  Complications: None  Estimated Blood Loss: < 10 mL  Findings: 10 Fr drain placed in left lobe hepatic abscess with return of turbid, yellow fluid. Fluid sample sent for culture analysis. Drain attached to suction bulb drainage.  Will follow.  Venetia Night. Kathlene Cote, M.D Pager:  [phone]
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
## 2022-01-03 NOTE — Progress Notes (Signed)
Initial Nutrition Assessment  DOCUMENTATION CODES:   Not applicable  INTERVENTION:   Monitor adequacy of intake when diet is advanced and add PO supplements as needed.  Obtain current weight measurement.   NUTRITION DIAGNOSIS:   Inadequate oral intake related to altered GI function as evidenced by NPO status.  GOAL:   Patient will meet greater than or equal to 90% of their needs  MONITOR:   Diet advancement, PO intake, Labs  REASON FOR ASSESSMENT:   Malnutrition Screening Tool    ASSESSMENT:   46 yo female admitted with multiple intraabdominal abscesses, suspected gastric viscera rupture. PMH includes bipolar disorder, HTN, HLD, shellfish allergy, chronic urticaria.  CT on admission showed a subcapsular fluid collection in the L hepatic lobe concerning for abscess, multiple fluid collections in the R pelvis, and a fluid collection in the perirectal space concerning for abscess.   Patient reports that she has not lost weight recently even though unintentional weight loss was reported on admission nutrition screen. She lost some weight intentionally a few months ago because she was told she had diabetes. She has been eating poorly since discharge home after recent admission d/t ongoing abdominal pain, but endorses no weight loss. She weighed 185 lbs earlier this year, then got down to ~160 lbs. Current weight unavailable.   Currently NPO for IR procedure today to drain liver abscess. Patient states she is hungry and ready to eat after her procedure today.  Labs reviewed.  Medications reviewed and include Flagyl, Protonix, sucralfate. IVF: LR at 100 ml/h.  NUTRITION - FOCUSED PHYSICAL EXAM:  Flowsheet Row Most Recent Value  Orbital Region No depletion  Upper Arm Region No depletion  Thoracic and Lumbar Region No depletion  Buccal Region No depletion  Temple Region No depletion  Clavicle Bone Region No depletion  Clavicle and Acromion Bone Region No depletion   Scapular Bone Region No depletion  Dorsal Hand No depletion  Patellar Region No depletion  Anterior Thigh Region No depletion  Posterior Calf Region No depletion  Edema (RD Assessment) None  Hair Reviewed  Eyes Reviewed  Mouth Reviewed  Skin Reviewed  Nails Reviewed       Diet Order:   Diet Order             Diet NPO time specified  Diet effective now                   EDUCATION NEEDS:   No education needs have been identified at this time  Skin:  Skin Assessment: Reviewed RN Assessment  Last BM:  5/23  Height:   Ht Readings from Last 1 Encounters:  12/22/21 5\' 6"  (1.676 m)    Weight:   Wt Readings from Last 1 Encounters:  12/22/21 74.8 kg    BMI:  There is no height or weight on file to calculate BMI.  Estimated Nutritional Needs:   Kcal:  1900-2100  Protein:  90-110 gm  Fluid:  1.9-2.1 L    Lucas Mallow RD, LDN, CNSC Please refer to Amion for contact information.

## 2022-01-03 NOTE — Consult Note (Signed)
[facility] for Infectious Disease    Date of Admission:  01/02/2022     Reason for Consult: multiple intraabdominal abscesses    Referring Provider: Owens Shark    Abx: 5/23 meropenem  5/18-?23 cephalexin/linezolid        Assessment: On humira for HS On recent prednisone for 2 months and high dose aspirin use Intraabdominal/adnexa/hepatic abscesses and cavitary lung lesion Sepsis   The abscesses are rapidly developing suggesting infection. At risk for fungal/afb due to pred/humira but pace rather fast for those; similary for malignant process.   Gi/surgery evaluated and suspect a gastric viscera rupture initially contained. I agree this is a likely explanation.   At this time will need sampling in one of the abscesses for culture and cytology  Previously: Hiv negative H pylori igg negative Bcx negative    Plan: IR to do bacterial, afb, fungal cultures and cytology I would consider sending for endemic fungi serology too as could have more than 1 infectious process -- fungal cx as above is asked for  She has transferred to [facility] and will notify team there of her presence for continued consultation     ------------------------------------------------ Principal Problem:   Abscess of multiple sites Active Problems:   Chronic urticaria   Hidradenitis suppurativa   Essential hypertension   Cavitary lesion of lung   Immunosuppressed status ([place])   Peptic ulcer disease   Abnormal CT of the abdomen   Premature menopause on hormone replacement therapy   Anxiety   Diarrhea   Abdominal pain   SIRS (systemic inflammatory response syndrome) ([place])   Diabetes ([place])   Dyslipidemia    HPI: Crystal Mora is a 46 y.o. female bipolar, Depression/anxiety, hidradenitis suppuritiva on humira, chronic urticaria on xolair/hydroxychloroquin, readmitted 5/23 with fever, chill, nightsweat, malaise, abdominal pain, diarrhea after leaving [facility] for the same  She was  admitted 5/12-18 for same (sx of 3 days) Chest/abd imaging at that time showed a 12 mm right lung cavitary lesion, a complex cystic lesion in the pancrea and right adnexal 4.5 cm cyst  ID, Pulmonary, surgery, and GI were consulted. Initial concern was for contained ruptured gastric viscera by both GI and surgery but no emergent need for surgery. There was plan for eus with egd outpatient. Pulm also planned to f/u on the cavitary lesion  H pylori checked negative Tte no vegetation Quantiferon gold negative Bcx and ucx negative  She has been on prednisone about 2 months prior to admission along with about 50 BC powders (aspirin) a week for back pain/headache. She reports trying to loose weight to help with diabetes for the past few months as well. No black/bloody stool or hematemesis Lipase previous admission was negative  Her fever defervesced  She left [facility] before being reevaluated by ID but received 10 days of linezolid (has superficial thrombophlebitis that admission) and cephalexin   Source of fever unclear  She returns 5/23 for same, at [facility] I discussed with ED physician about worsening intraabdominal abscesses and right lung cavitary lesions and hepatic abscesses  I asked for IR evaluation of abdominal abscesses and see if it can be sampled for bacterial, afb/fungal cx and cytology She wasn't septic so abx was hold  It appears she was transferred to [facility]       Family History  Problem Relation Age of Onset   Depression Mother    Mental illness Mother    Asthma Mother  COPD Mother    Diabetes Mother    Mental illness Brother    Mental illness Daughter    Diabetes Maternal Aunt    Mental illness Maternal Aunt    Depression Maternal Aunt    Diabetes Maternal Grandmother     Social History   Tobacco Use   Smoking status: Former    Packs/day: 0.50    Types: Cigars, Cigarettes   Smokeless tobacco: Never   Tobacco comments:    1 cigar daily   Vaping Use   Vaping Use: Every day   Substances: Flavoring  Substance Use Topics   Alcohol use: Yes    Comment: Rare   Drug use: No    Allergies  Allergen Reactions   Biaxin [Clarithromycin]    Morphine And Related Hives    Says it is okay with benadryl administration   Penicillins Hives, Itching and Swelling    Can tolerate cephalosporins    Percocet [Oxycodone-Acetaminophen] Hives and Itching   Shrimp [Shellfish Allergy]    Citalopram Anxiety    aggression   Topiramate Rash    Review of Systems: ROS All Other ROS was negative, except mentioned above   Past Medical History:  Diagnosis Date   Anxiety    Bipolar 1 disorder ([place])    Chronic urticaria    Depression    Diabetes mellitus without complication ([place])    Hypertension    states she has not took BP medication for 2 years   Insomnia    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   Thyroid disease        Scheduled Meds:  diazepam  5 mg Oral TID   enoxaparin (LOVENOX) injection  40 mg Subcutaneous Q24H   estradiol  0.05 mg Transdermal Weekly   nicotine  14 mg Transdermal Daily   pantoprazole (PROTONIX) IV  40 mg Intravenous Q12H   progesterone  100 mg Oral Daily   rosuvastatin  5 mg Oral Daily   sucralfate  1 g Oral TID WC & HS   Continuous Infusions:  lactated ringers     lactated ringers 100 mL/hr at 01/03/22 0913   PRN Meds:.acetaminophen **OR** acetaminophen, cycloSPORINE, gabapentin, HYDROcodone-acetaminophen, HYDROmorphone (DILAUDID) injection, hydrOXYzine, ondansetron **OR** ondansetron (ZOFRAN) IV   OBJECTIVE: Blood pressure 124/79, pulse 93, temperature 98.5 F (36.9 C), temperature source Oral, resp. rate 16, SpO2 95 %.  Physical Exam  Chart review  Lab Results Lab Results  Component Value Date   WBC 10.9 (H) 01/02/2022   HGB 10.9 (L) 01/02/2022   HCT 33.5 (L) 01/02/2022   MCV 87.0 01/02/2022   PLT 484 (H) 01/02/2022    Lab Results  Component Value Date   CREATININE 0.52  01/02/2022   BUN 7 01/02/2022   NA 137 01/02/2022   K 3.6 01/02/2022   CL 101 01/02/2022   CO2 29 01/02/2022    Lab Results  Component Value Date   ALT 19 01/02/2022   AST 20 01/02/2022   ALKPHOS 105 01/02/2022   BILITOT 0.7 01/02/2022      Microbiology: Recent Results (from the past 240 hour(s))  Culture, blood (Routine X 2) w Reflex to ID Panel     Status: None   Collection Time: 12/25/21  7:34 AM   Specimen: BLOOD RIGHT HAND  Result Value Ref Range Status   Specimen Description BLOOD RIGHT HAND  Final   Special Requests   Final    BOTTLES DRAWN AEROBIC ONLY Blood Culture results may not be optimal due  to an inadequate volume of blood received in culture bottles   Culture   Final    NO GROWTH 5 DAYS Performed at [facility], [place] [address] [place], [place] [address]    Report Status 12/30/2021 FINAL  Final  Culture, blood (Routine X 2) w Reflex to ID Panel     Status: None   Collection Time: 12/25/21  7:40 AM   Specimen: BLOOD LEFT HAND  Result Value Ref Range Status   Specimen Description BLOOD LEFT HAND  Final   Special Requests   Final    BOTTLES DRAWN AEROBIC AND ANAEROBIC Blood Culture adequate volume   Culture   Final    NO GROWTH 5 DAYS Performed at [facility], [place] [address], [place], [place] [address]    Report Status 12/30/2021 FINAL  Final  Urine Culture     Status: None   Collection Time: 12/25/21  7:48 AM   Specimen: Urine, Clean Catch  Result Value Ref Range Status   Specimen Description URINE, CLEAN CATCH  Final   Special Requests NONE  Final   Culture   Final    NO GROWTH Performed at [facility], [place] [address] [place], [place] [address]    Report Status 12/26/2021 FINAL  Final  Urine Culture     Status: Abnormal   Collection Time: 12/27/21 12:09 PM   Specimen: Urine, Clean Catch  Result Value Ref Range Status   Specimen Description URINE, CLEAN CATCH  Final   Special Requests NONE  Final   Culture (A)  Final     <10,000 COLONIES/mL INSIGNIFICANT GROWTH Performed at [facility], [place] [address] [place], [place] [address]    Report Status 12/28/2021 FINAL  Final  Culture, blood (Routine X 2) w Reflex to ID Panel     Status: None   Collection Time: 12/27/21 12:50 PM   Specimen: BLOOD  Result Value Ref Range Status   Specimen Description BLOOD RIGHT ANTECUBITAL  Final   Special Requests   Final    BOTTLES DRAWN AEROBIC AND ANAEROBIC Blood Culture adequate volume   Culture   Final    NO GROWTH 5 DAYS Performed at [facility], [place] [address], [place], [place] [address]    Report Status 01/01/2022 FINAL  Final  Culture, blood (Routine X 2) w Reflex to ID Panel     Status: None   Collection Time: 12/27/21 12:57 PM   Specimen: BLOOD  Result Value Ref Range Status   Specimen Description BLOOD BLOOD RIGHT HAND  Final   Special Requests   Final    BOTTLES DRAWN AEROBIC AND ANAEROBIC Blood Culture adequate volume   Culture   Final    NO GROWTH 5 DAYS Performed at [facility], [place] [address] [place], [place] [address]    Report Status 01/01/2022 FINAL  Final  Wet prep, genital     Status: Abnormal   Collection Time: 01/02/22  4:10 PM  Result Value Ref Range Status   Yeast Wet Prep HPF POC NONE SEEN NONE SEEN Final   Trich, Wet Prep NONE SEEN NONE SEEN Final   Clue Cells Wet Prep HPF POC PRESENT (A) NONE SEEN Final   WBC, Wet Prep HPF POC <10 <10 Final   Sperm NONE SEEN  Final    Comment: Performed at [facility], [place] [address], [place], [place] [address]  Blood culture (routine x 2)  Status: None (Preliminary result)   Collection Time: 01/02/22  8:55 PM   Specimen: BLOOD  Result Value Ref Range Status   Specimen Description BLOOD BLOOD RIGHT HAND  Final   Special Requests   Final    BOTTLES DRAWN AEROBIC ONLY Blood Culture results may not be optimal due to an inadequate volume of blood received in culture bottles   Culture   Final    NO GROWTH < 12  HOURS Performed at [facility], [place] [address] [place], [place] [address]    Report Status PENDING  Incomplete  Blood culture (routine x 2)     Status: None (Preliminary result)   Collection Time: 01/02/22  8:56 PM   Specimen: BLOOD  Result Value Ref Range Status   Specimen Description BLOOD BLOOD RIGHT ARM  Final   Special Requests   Final    BOTTLES DRAWN AEROBIC AND ANAEROBIC Blood Culture results may not be optimal due to an inadequate volume of blood received in culture bottles   Culture   Final    NO GROWTH < 12 HOURS Performed at [facility], [place] [address], [place], [place] [address]    Report Status PENDING  Incomplete   Micro: 5/23 bcx in progress 5/17 bcx negative 5/17 ucx <10k growth not worked up 5/15 ucx negative 5/15 and 5/12 bcx negative  Serology: 5/23 wet prep -- clue cells present  5/13 hiv screen negative 5/12 hpylori igg negative 5/12 quantiferon gold negative    Imaging: If present, new imagings (plain films, ct scans, and mri) have been personally visualized and interpreted; radiology reports have been reviewed. Decision making incorporated into the Impression / Recommendations.  5/23 ct chest abd pelv with contrast 14 x 7 mm pleural based density is noted posteriorly in right lower lobe with cavitation, concerning for possible cavitating pneumonia or necrotic neoplasm. Follow-up CT scan in 2-3 weeks is recommended to ensure resolution or stability.   Significantly increased 6.6 x 4.9 cm subcapsular fluid collection along inferior aspect of left hepatic lobe concerning for abscess. There is adjacent wall and fold thickening of the distal stomach concerning for peptic ulcer disease or other inflammation.   Also noted are multiple fluid collections in the right side of the pelvis, the largest measuring 4.4 by 3.9 cm, concerning for tubo-ovarian abscess and possible pelvic inflammatory disease. Also noted is 5.8 x 3.8 cm fluid  collection with enhancing wall margins in the pre rectal space concerning for abscess.   Mild thickening of pancreatic body is noted suggesting acute pancreatitis, with probable small pseudo cyst which is decreased compared to prior exam.    5/15 ct abd pelv Peripancreatic edema at proximal tail of pancreas consistent with acute pancreatitis.   Somewhat ill-defined low-attenuation focus within proximal pancreatic tail , 11 x 10 x 12 mm, question pseudocyst, decreased in size since previous exam.   Thickening of wall of gastric antrum especially posteriorly, extending into pylorus, could be related favor gastritis versus ulcer disease.   4.2 cm diameter RIGHT ovarian cyst, simple appearing by CT; follow-up ultrasound recommended in 3-6 months.   Aortic Atherosclerosis   5/12 ct chest pe protocol 1. No CT evidence for acute pulmonary embolus. 2. 12 mm cavitary nodule identified right lower lobe. This may be infectious/inflammatory in etiology. Appearance of the nodule raises the question of septic embolus although no additional peripheral ill-defined nodules are seen in either lung. Given findings below, close follow-up will be required. 3. 3.3 x  2.7 x 2.6 cm complex multicystic lesion along the cranial margin of the pancreatic body extending up adjacent to the gastric fundus inferior to the esophagogastric junction. This is associated with a cluster of mildly enlarged lymph nodes in the same region. Imaging features may reflect an exophytic primary multicystic pancreatic process (pseudocyst or neoplasm). Cystic/necrotic lymph nodes from alternative source (lower esophagus/stomach) would also be a consideration. MRI abdomen with and without contrast might be able to add some additional insight, but upper endoscopy with endoscopic ultrasound would also be a consideration for further assessment 4. 4.2 cm benign appearing right adnexal cyst. No follow-up Warranted   Jabier Mutton, [place] [facility] [place] [phone] pager    01/03/2022, 9:40 AM

## 2022-01-03 NOTE — Hospital Course (Addendum)
Crystal Mora is a 46 y.o. female who was admitted to the [facility] Teaching Service at [facility] for abdominal pain and fever and found to multiple intra-abdominal abscess. Hospital course is outlined below by system.   Abdominal tenderness w/ fever likely 2/2 multiple intra-abdominal abscesses Patient returned to our service after leaving [facility] 5/18.  Patient reported continued abdominal pain and fever after leaving.  Repeat CT on readmission showed area of subcapsular fluid collection in the inferior left hepatic lobe concerning for abscess and multiple fluid collection in the right side of the pelvis which is concerning for TOA/possible PID and area of fluid collection in the perirectal space concerning for abscess. GC/ Chlamydia screen was negative and blood culture showed no growth for >3 days. Due to concerns of opportunistic infection in the setting of immune suppression from longstanding Humira use and steroid use, her Humira, steroid and Xolair were held. The abscess was drained and fluid was sent in for cytology which is still pending at discharge. Patient was started on broad antibiotics of IV ceftriaxone and metronidazole (5/24). She remained afebrile on antibiotics with good pain control. Discharged with a PICC line to continue IV ceftriaxone for 4 weeks and PO Flagyl 500 mg BID for 4 weeks. She has close follow up appointment with ID.    RLL cavitary lung lesion CT showed 14/7 mm pleura based cavitary lesion. Recent Quant gold test was negative. Pulmonology recommend follow up CT in 2-3 weeks.   Significant events: 5/24 CT guided IR drainage of hepatic abscess  Discharge recommendations:  Needs repeat CT chest to follow up lung nodule/abscess in about 3 weeks after discharge.  Discharged with PICC line for 4 weeks of IV CTX 2g daily. To be managed by home health nurse Patient is to continue PO Metronidazole 500mg  BID for 4 weeks Abccess fluid culture was pending by discharge, will  follow up with culture. We held her Humira, Steroid and Xolair due to concerns she could be immunocompromised and susceptible to opportunistic infection given multiple intraabdominal abscess.

## 2022-01-03 NOTE — Progress Notes (Signed)
OBSTETRICS AND GYNECOLOGY ATTENDING CONSULT NOTE  Consult Date: 01/03/2022 Reason for Consult: Possible PID? Consulting Provider: FP team   Assessment/Plan: Reviewed the patient's chart, images and labs. I reviewed the CT images myself. Based on her history, her primary infection would not be PID. She has had a tubal ligation (twice technically) and she is negative for GC/CT. Additionally, she has another reasonable source for the infection I.e. the gastric perforation. Additionally, we would not expect PID to cause liver abscesses. While patients can develop Crystal Mora, this is classically RUQ pain due to diffuse purulent material causing liver inflammation, not abscess development.   Given the pelvis is continuous with the abdomen and the dependent portion of the patient's body, this is where I would expect fluid to collect and the posterior fluid collection does appear to be more dependent. The fluid can wall off and create new abscesses and the decision to drain would be best evaluated by IR at that time, however, antibiotic therapy even in TOAs is often times considered first line especially for abscesses <8cm. The other fluid collection is adjacent to too many other structures to be able to tell definitively the source, but it is anterior to the uterus and at the superior edge making it unlikely to be TOA. If for some reason general surgery took the patient to the OR for the gastric perforation or other indication, we would be happy to be present in the OR for the pelvic evaluation.   At this time, I would not advise surgical intervention by gynecology due to the high risk of injury and the patient otherwise overall appearing well.  I would defer antibiotic choice to the Infectious Disease team. I discussed this with the patient and she voiced understanding. I reviewed any questions she had.   Appreciate care of Crystal Mora by her primary team  Will follow along on  patient  Please call [phone] [facility] OB/GYN Consult Attending Monday-Friday 8am - 5pm) or [phone] [facility] OB/GYN Attending On Call all day, every day) for any gynecologic at any time.  Thank you for involving [place] in the care of this patient.  Total consultation time including face-to-face time with patient (>50% of time), reviewing chart and documentation: 60 minutes  Crystal Gunning MD, [place] for [facility]  History of Present Illness: Crystal Mora is an 46 y.o. [id number] female who was admitted for sepsis and abdominal abscesses. She had been admitted last week and then she left AMA. She then returned with ongoing malaise, fever, abdominal pain and diarrhea. She is on humira for HS.   She was recently admitted from 5/12-5/18 with the same issues.  She left AMA, ultimately on keflex and linezolid.  During that admission she was found to have a cavitary lung lesion which was evaluated by pulmonology.  GI and surgery were consulted for a posterior contained perforated gastric ulcer and pancreatic lesion.  No surgical interventions were planned by surgery.  GI planned outpatient follow up with eventual endoscopic evaluation of antral wall thickening and PUD, plan for EGD in 6-8 weeks to document ulcer healing and evaluate for H pylori, etc.   She was recommended to be on high dose PPI and carafate.  ID had been consulted during the hospitalization as well with recurrent fevers.  She left AMA, frustrated ultimately with prolonged hospitalization. She is a [employer] and cares for her 6 children who live at home.  Since leaving she's been bedbound with unchanged symptoms.  Night sweats, fevers, diarrhea (2-3 times a day, no blood).  She notes SOB with activity, no orthopnea.  Notes epigastric pain that goes straight through to her back, sharp, coming and going.  Notes pelvic pain that moves to her right and feels like  a sharp bear hug.  She notes dysuria.  No vaginal discharge or vaginal symptoms.   Social history: She is married Crystal Mora). She is a SAHM.  Smokes cigars.  No drinking.  No other drugs, no IV drugs.  Pertinent OB/GYN History: No LMP recorded. (Menstrual status: Perimenopausal). OB History  Gravida Para Term Preterm AB Living  14 8 3 5 3 8   SAB IAB Ectopic Multiple Live Births  1 2 0 1 8    # Outcome Date GA Lbr Len/2nd Weight Sex Delivery Anes PTL Lv  14 Gravida           13 Term 09/24/12 [redacted]w[redacted]d   F CS-LTranv   LIV  12 Term 08/25/09 [redacted]w[redacted]d   F CS-LTranv   LIV  11 Term 09/01/08 [redacted]w[redacted]d   M CS-LTranv   LIV  10 Preterm 06/25/07 [redacted]w[redacted]d   F CS-LTranv   LIV  9 Preterm 09/17/02 [redacted]w[redacted]d   F Vag-Spont   LIV  8 Preterm 12/27/00 [redacted]w[redacted]d   M Vag-Spont   LIV  7 Preterm 08/14/98 [redacted]w[redacted]d   F Vag-Spont   LIV  6 Preterm 12/04/94 [redacted]w[redacted]d   F Vag-Spont   LIV  5 Gravida           4A Gravida           4B Gravida           3 SAB           2 IAB           1 IAB           POBH: She had 4 SVDs. She then had a tubal ligation. She then in 2008 had a tubal reversal. She then had 5 c-sections and with the last one she had a tubal ligation. This was performed at [place]: She denies history of abnormal paps. She had a pap smear 12/2020 which was normal and HPV negative. CA125 at that time was 7. She was thought to be postmenopausal and was placed on estrogen patch and oral progesterone and this controls her hot flashes.   Patient Active Problem List   Diagnosis Date Noted   Abscess of multiple sites 01/02/2022   Cavitary lesion of lung 01/02/2022   Immunosuppressed status ([place]) 01/02/2022   Peptic ulcer disease 01/02/2022   Abnormal CT of the abdomen 01/02/2022   Premature menopause on hormone replacement therapy 01/02/2022   Anxiety 01/02/2022   Diarrhea 01/02/2022   Abdominal pain 01/02/2022   SIRS (systemic inflammatory response syndrome) ([place]) 01/02/2022   Diabetes ([place]) 01/02/2022    Dyslipidemia 01/02/2022   Pancreatic cyst    Gastric ulcer without hemorrhage or perforation    Leukocytosis    Acute gastric perforation 12/25/2021   Epigastric pain 12/22/2021   Peroneal tendinitis 12/06/2021   Chronic buttock pain 07/26/2021   Arthropathy of lumbar facet joint 10/10/2020   Bipolar 2 disorder, major depressive episode ([place]) 03/30/2020   Other bipolar disorder ([place]) 03/30/2020   GAD (generalized anxiety disorder) 03/30/2020   Hearing loss 04/30/2019   IFG (impaired fasting glucose) 04/30/2019   Overweight with body mass index (BMI) of 28  to 28.9 in adult 01/28/2019   Cervical radiculopathy 01/06/2019   Depression 10/30/2018   Essential hypertension 10/30/2018   Mixed hyperlipidemia 10/30/2018   History of thyroidectomy, subtotal 09/10/2018   Lumbar degenerative disc disease 09/05/2018   Chronic migraine w/o aura w/o status migrainosus, not intractable 06/19/2018   Chronic back pain 05/30/2018   Menorrhagia 11/25/2017   Iron deficiency 10/03/2017   Pulsatile tinnitus of left ear 07/18/2017   Sensorineural hearing loss (SNHL), bilateral 07/18/2017   Cesarean deliv due to previous difficult deliv, deliv, curr hospitaliz 01/02/2017   Chronic narcotic use 06/29/2016   History of eclampsia 06/29/2016   History of reversal of tubal ligation 03/28/2016   Anemia 01/12/2016   GERD (gastroesophageal reflux disease) 11/10/2015   Analgesic rebound headache 07/25/2015   Chronic urticaria 07/25/2015   Hidradenitis suppurativa 07/25/2015   Hyperopia of both eyes with astigmatism and presbyopia 07/25/2015   Meibomian gland dysfunction (MGD) of upper and lower lids of both eyes 07/25/2015   Psychiatric disorder 07/25/2015    Past Medical History:  Diagnosis Date   Anxiety    Bipolar 1 disorder ([place])    Chronic urticaria    Depression    Diabetes mellitus without complication ([place])    Hypertension    states she has not took BP medication for 2 years   Insomnia     Neuromuscular disorder ([place])    back   Thyroid disease     Past Surgical History:  Procedure Laterality Date   CESAREAN SECTION     THYROID SURGERY     TUBAL LIGATION     tubal reversal Bilateral     Family History  Problem Relation Age of Onset   Depression Mother    Mental illness Mother    Asthma Mother    COPD Mother    Diabetes Mother    Mental illness Brother    Mental illness Daughter    Diabetes Maternal Aunt    Mental illness Maternal Aunt    Depression Maternal Aunt    Diabetes Maternal Grandmother     Social History:  reports that she has quit smoking. Her smoking use included cigars and cigarettes. She smoked an average of .5 packs per day. She has never used smokeless tobacco. She reports current alcohol use. She reports that she does not use drugs.  Allergies:  Allergies  Allergen Reactions   Biaxin [Clarithromycin]    Morphine And Related Hives    Says it is okay with benadryl administration   Penicillins Hives, Itching and Swelling    Can tolerate cephalosporins    Percocet [Oxycodone-Acetaminophen] Hives and Itching   Shrimp [Shellfish Allergy]    Citalopram Anxiety    aggression   Topiramate Rash    Medications: I have reviewed the patient's current medications.  Review of Systems: Pertinent items are noted in HPI.  Focused Physical Examination: BP (!) 145/96   Pulse (!) 101   Temp 98 F (36.7 C)   Resp 18   SpO2 94%  CONSTITUTIONAL: Well-developed, well-nourished female in no acute distress.  NEUROLOGIC: Alert and oriented to person, place, and time. Normal reflexes, muscle tone coordination. No cranial nerve deficit noted. PSYCHIATRIC: Normal mood and affect. Normal behavior. Normal judgment and thought content. CARDIOVASCULAR: Normal heart rate noted, regular rhythm RESPIRATORY: Clear to auscultation bilaterally. Effort and breath sounds normal, no problems with respiration noted. ABDOMEN: Soft, normal bowel sounds, no distention  noted.  No tenderness, rebound or guarding.  PELVIC: Deferred MUSCULOSKELETAL: Normal range of  motion. No tenderness.  No cyanosis, clubbing, or edema.  2+ distal pulses.  Labs and Imaging: Results for orders placed or performed during the hospital encounter of 01/02/22 (from the past 72 hour(s))  Comprehensive metabolic panel     Status: Abnormal   Collection Time: 01/02/22  1:29 PM  Result Value Ref Range   Sodium 137 135 - 145 mmol/L   Potassium 3.6 3.5 - 5.1 mmol/L   Chloride 101 98 - 111 mmol/L   CO2 29 22 - 32 mmol/L   Glucose, Bld 90 70 - 99 mg/dL    Comment: Glucose reference range applies only to samples taken after fasting for at least 8 hours.   BUN 7 6 - 20 mg/dL   Creatinine, Ser 0.55 0.44 - 1.00 mg/dL   Calcium 7.9 (L) 8.9 - 10.3 mg/dL   Total Protein 6.7 6.5 - 8.1 g/dL   Albumin 2.3 (L) 3.5 - 5.0 g/dL   AST 20 15 - 41 U/L   ALT 19 0 - 44 U/L   Alkaline Phosphatase 105 38 - 126 U/L   Total Bilirubin 0.7 0.3 - 1.2 mg/dL   GFR, Estimated >60 >60 mL/min    Comment: (NOTE) Calculated using the CKD-EPI Creatinine Equation (2021)    Anion gap 7 5 - 15    Comment: Performed at [facility], [place] [address], [place], [place] [address]  Lipase, blood     Status: None   Collection Time: 01/02/22  1:29 PM  Result Value Ref Range   Lipase 32 11 - 51 U/L    Comment: Performed at [facility], [place] [address], [place], [place] [address]  CBC with Diff     Status: Abnormal   Collection Time: 01/02/22  1:29 PM  Result Value Ref Range   WBC 11.2 (H) 4.0 - 10.5 K/uL   RBC 3.89 3.87 - 5.11 MIL/uL   Hemoglobin 11.5 (L) 12.0 - 15.0 g/dL   HCT 34.2 (L) 36.0 - 46.0 %   MCV 87.9 80.0 - 100.0 fL   MCH 29.6 26.0 - 34.0 pg   MCHC 33.6 30.0 - 36.0 g/dL   RDW 15.8 (H) 11.5 - 15.5 %   Platelets 463 (H) 150 - 400 K/uL   nRBC 0.0 0.0 - 0.2 %   Neutrophils Relative % 82 %   Neutro Abs 9.1 (H) 1.7 - 7.7 K/uL   Lymphocytes Relative 10 %   Lymphs Abs  1.2 0.7 - 4.0 K/uL   Monocytes Relative 7 %   Monocytes Absolute 0.8 0.1 - 1.0 K/uL   Eosinophils Relative 0 %   Eosinophils Absolute 0.0 0.0 - 0.5 K/uL   Basophils Relative 0 %   Basophils Absolute 0.0 0.0 - 0.1 K/uL   Immature Granulocytes 1 %   Abs Immature Granulocytes 0.06 0.00 - 0.07 K/uL    Comment: Performed at [facility], [place] [address], [place], [place] [address]  Urinalysis, Routine w reflex microscopic Urine, Clean Catch     Status: Abnormal   Collection Time: 01/02/22  1:29 PM  Result Value Ref Range   Color, Urine YELLOW YELLOW   APPearance CLEAR CLEAR   Specific Gravity, Urine 1.008 1.005 - 1.030   pH 8.0 5.0 - 8.0   Glucose, UA NEGATIVE NEGATIVE mg/dL   Hgb urine dipstick SMALL (A) NEGATIVE   Bilirubin Urine NEGATIVE NEGATIVE   Ketones, ur 5 (A) NEGATIVE mg/dL   Protein, ur NEGATIVE NEGATIVE mg/dL   Nitrite  NEGATIVE NEGATIVE   Leukocytes,Ua NEGATIVE NEGATIVE   RBC / HPF 0-5 0 - 5 RBC/hpf   WBC, UA 0-5 0 - 5 WBC/hpf   Bacteria, UA RARE (A) NONE SEEN   Squamous Epithelial / LPF 0-5 0 - 5   Mucus PRESENT     Comment: Performed at [facility], [place], [place] [address] [place], [place] [address]  Magnesium     Status: None   Collection Time: 01/02/22  1:29 PM  Result Value Ref Range   Magnesium 2.1 1.7 - 2.4 mg/dL    Comment: Performed at [facility], [place] [address], [place], [place] [address]  Lactic acid, plasma     Status: None   Collection Time: 01/02/22  1:29 PM  Result Value Ref Range   Lactic Acid, Venous 0.7 0.5 - 1.9 mmol/L    Comment: Performed at [facility], [place] [address], [place], [place] [address]  Sedimentation rate     Status: Abnormal   Collection Time: 01/02/22  1:29 PM  Result Value Ref Range   Sed Rate 86 (H) 0 - 22 mm/hr    Comment: Performed at [facility], [place] [address], [place], [place] [address]  Hemoglobin A1c     Status: Abnormal    Collection Time: 01/02/22  1:29 PM  Result Value Ref Range   Hgb A1c MFr Bld 6.3 (H) 4.8 - 5.6 %    Comment: (NOTE) Pre diabetes:          5.7%-6.4%  Diabetes:              >6.4%  Glycemic control for   <7.0% adults with diabetes    Mean Plasma Glucose 134.11 mg/dL    Comment: Performed at [facility] [address] [place], [place] [address]  I-Stat beta hCG blood, ED     Status: None   Collection Time: 01/02/22  1:34 PM  Result Value Ref Range   I-stat hCG, quantitative <5.0 <5 mIU/mL   Comment 3            Comment:   GEST. AGE      CONC.  (mIU/mL)   <=1 WEEK        5 - 50     2 WEEKS       50 - 500     3 WEEKS       100 - 10,000     4 WEEKS     1,000 - 30,000        FEMALE AND NON-PREGNANT FEMALE:     LESS THAN 5 mIU/mL   TSH     Status: None   Collection Time: 01/02/22  1:40 PM  Result Value Ref Range   TSH 0.393 0.350 - 4.500 uIU/mL    Comment: Performed by a 3rd Generation assay with a functional sensitivity of <=0.01 uIU/mL. Performed at [facility], [place] [address] [place], [place] [address]   C-reactive protein     Status: Abnormal   Collection Time: 01/02/22  1:40 PM  Result Value Ref Range   CRP 33.2 (H) <1.0 mg/dL    Comment: Performed at [place] [address] [place], [place] [address]  GC/Chlamydia probe amp [facility]) not at [facility]     Status: None   Collection Time: 01/02/22  4:10 PM  Result Value Ref Range   Neisseria Gonorrhea Negative    Chlamydia Negative    Comment  Normal Reference [place] Chlamydia - Negative    Comment      Normal Reference Range Neisseria Gonorrhea - Negative  Wet prep, genital     Status: Abnormal   Collection Time: 01/02/22  4:10 PM  Result Value Ref Range   Yeast Wet Prep HPF POC NONE SEEN NONE SEEN   Trich, Wet Prep NONE SEEN NONE SEEN   Clue Cells Wet Prep HPF POC PRESENT (A) NONE SEEN   WBC, Wet Prep HPF POC <10 <10   Sperm NONE SEEN     Comment: Performed at [facility], [place] [address], [place], [place] [address]  Blood culture (routine x 2)     Status: None (Preliminary result)   Collection Time: 01/02/22  8:55 PM   Specimen: BLOOD  Result Value Ref Range   Specimen Description BLOOD BLOOD RIGHT HAND    Special Requests      BOTTLES DRAWN AEROBIC ONLY Blood Culture results may not be optimal due to an inadequate volume of blood received in culture bottles   Culture      NO GROWTH < 24 HOURS Performed at [place] [address] [place], [place] [address]    Report Status PENDING   Blood culture (routine x 2)     Status: None (Preliminary result)   Collection Time: 01/02/22  8:56 PM   Specimen: BLOOD  Result Value Ref Range   Specimen Description BLOOD BLOOD RIGHT ARM    Special Requests      BOTTLES DRAWN AEROBIC AND ANAEROBIC Blood Culture results may not be optimal due to an inadequate volume of blood received in culture bottles   Culture      NO GROWTH < 24 HOURS Performed at [facility], [place] [address] [place], [place] [address]    Report Status PENDING   CBC     Status: Abnormal   Collection Time: 01/02/22  8:56 PM  Result Value Ref Range   WBC 10.9 (H) 4.0 - 10.5 K/uL   RBC 3.85 (L) 3.87 - 5.11 MIL/uL   Hemoglobin 10.9 (L) 12.0 - 15.0 g/dL   HCT 33.5 (L) 36.0 - 46.0 %   MCV 87.0 80.0 - 100.0 fL   MCH 28.3 26.0 - 34.0 pg   MCHC 32.5 30.0 - 36.0 g/dL   RDW 15.3 11.5 - 15.5 %   Platelets 484 (H) 150 - 400 K/uL   nRBC 0.0 0.0 - 0.2 %    Comment: Performed at [facility], [place] [address] [place], [place] [address]  Creatinine, serum     Status: None   Collection Time: 01/02/22  8:56 PM  Result Value Ref Range   Creatinine, Ser 0.52 0.44 - 1.00 mg/dL   GFR, Estimated >60 >60 mL/min    Comment: (NOTE) Calculated using the CKD-EPI Creatinine Equation (2021) Performed at [place] [address] [place], [place] [address]     CT CHEST ABDOMEN PELVIS W CONTRAST  Result Date:  01/02/2022 CLINICAL DATA:  Shortness of breath, gastric abdominal pain. EXAM: CT CHEST, ABDOMEN, AND PELVIS WITH CONTRAST TECHNIQUE: Multidetector CT imaging of the chest, abdomen and pelvis was performed following the standard protocol during bolus administration of intravenous contrast. RADIATION DOSE REDUCTION: This exam was performed according to the departmental dose-optimization program which includes automated exposure control, adjustment of the mA and/or kV according to patient size and/or use of iterative reconstruction technique. CONTRAST:  133mL OMNIPAQUE IOHEXOL 300 MG/ML  SOLN COMPARISON:  Dec 25, 2021. FINDINGS: CT CHEST FINDINGS Cardiovascular: No significant vascular findings. Normal heart size. No pericardial effusion. Mediastinum/Nodes: Status post left thyroidectomy. Esophagus is unremarkable. No adenopathy is noted. Lungs/Pleura: No pneumothorax or pleural effusion is noted. Mild left posterior basilar subsegmental atelectasis is noted. Mild right middle lobe atelectasis is noted. 14 x 7 mm pleural based density is noted posteriorly in right lower lobe with cavitation, concerning for possible cavitating pneumonia or neoplasm. Musculoskeletal: No chest wall mass or suspicious bone lesions identified. CT ABDOMEN PELVIS FINDINGS Hepatobiliary: No gallstones or biliary dilatation is noted. 6.6 x 4.9 cm subcapsular fluid collection is seen along the inferior aspect of the left hepatic lobe which is significantly increased since prior exam and concerning for possible abscess. Pancreas: Pancreatic body is mildly thickened suggesting acute pancreatitis, with probable small pseudo cyst that measures 8 mm currently, which is decreased compared to prior exam. No ductal dilatation is noted. Spleen: Normal in size without focal abnormality. Adrenals/Urinary Tract: Adrenal glands are unremarkable. Kidneys are normal, without renal calculi, focal lesion, or hydronephrosis. Bladder is unremarkable.  Stomach/Bowel: There is wall and fold thickening involving the distal stomach concerning for peptic ulcer disease. No abnormal large or small bowel dilatation is noted. The appendix is unremarkable. Vascular/Lymphatic: No definite vascular abnormality is noted. Mildly enlarged periaortic lymph nodes are noted which most likely are inflammatory in etiology. Reproductive: Uterus is unremarkable. Left adnexal region is unremarkable. There is now noted multiple fluid collections in the right adnexal region, the largest measuring 4.4 x 3.9 cm. This is concerning for possible tubo-ovarian abscess. There is also noted 5.8 x 3.8 cm fluid collection with enhancing margins in the pre rectal space of the pelvis concerning for abscess. Other: No definite hernia is noted. Musculoskeletal: No acute or significant osseous findings. IMPRESSION: 14 x 7 mm pleural based density is noted posteriorly in right lower lobe with cavitation, concerning for possible cavitating pneumonia or necrotic neoplasm. Follow-up CT scan in 2-3 weeks is recommended to ensure resolution or stability. Significantly increased 6.6 x 4.9 cm subcapsular fluid collection along inferior aspect of left hepatic lobe concerning for abscess. There is adjacent wall and fold thickening of the distal stomach concerning for peptic ulcer disease or other inflammation. Also noted are multiple fluid collections in the right side of the pelvis, the largest measuring 4.4 by 3.9 cm, concerning for tubo-ovarian abscess and possible pelvic inflammatory disease. Also noted is 5.8 x 3.8 cm fluid collection with enhancing wall margins in the pre rectal space concerning for abscess. Mild thickening of pancreatic body is noted suggesting acute pancreatitis, with probable small pseudo cyst which is decreased compared to prior exam. Electronically Signed   By: Marijo Conception M.D.   On: 01/02/2022 15:30   DG Chest Portable 1 View  Result Date: 01/02/2022 CLINICAL DATA:  RLL  lesion; of breath abdominal pain EXAM: PORTABLE CHEST 1 VIEW COMPARISON:  CT dated Dec 22, 2021; radiograph dated Dec 25, 2021 FINDINGS: The cardiomediastinal silhouette is unchanged in contour.Unchanged mild elevation of the RIGHT hemidiaphragm. No pleural effusion. No pneumothorax. No acute pleuroparenchymal abnormality. Visualized abdomen is unremarkable. Previously described cavitary lesion is not visualized radiographically. IMPRESSION: Previously described cavitary lesion is not visualized radiographically. Electronically Signed   By: Valentino Saxon M.D.   On: 01/02/2022 13:44   CT IMAGE GUIDED DRAINAGE BY PERCUTANEOUS CATHETER  Result Date: 01/03/2022 CLINICAL DATA:  LEFT LOBE HEPATIC ABSCESS. EXAM: CT GUIDED CATHETER DRAINAGE OF HEPATIC ABSCESS ANESTHESIA/SEDATION: Moderate (  conscious) sedation was employed during this procedure. A total of Versed 1.5 mg and Fentanyl 75 mcg was administered intravenously by radiology nursing. Moderate Sedation Time: 17 minutes. The patient's level of consciousness and vital signs were monitored continuously by radiology nursing throughout the procedure under my direct supervision. PROCEDURE: The procedure, risks, benefits, and alternatives were explained to the patient. Questions regarding the procedure were encouraged and answered. The patient understands and consents to the procedure. A time out was performed prior to initiating the procedure. CT was performed in a supine position through the upper to mid abdomen. The anterior abdominal wall was prepped with chlorhexidine in a sterile fashion, and a sterile drape was applied covering the operative field. A sterile gown and sterile gloves were used for the procedure. Local anesthesia was provided with 1% Lidocaine. A 17 gauge trocar needle was advanced into the left lobe of the liver under CT guidance. After confirming needle tip position, a small amount of fluid was aspirated through the needle. A guidewire was  advanced through the needle and the needle removed. The percutaneous tract was dilated over the wire to 10 [place] and a 10 French percutaneous drainage catheter advanced over the wire. A fluid sample was aspirated from the drain and sent for culture analysis. Catheter position was confirmed by CT. The catheter was attached to a suction bulb and secured at the skin with a Prolene retention suture. A sterile dressing was applied at the catheter exit site. RADIATION DOSE REDUCTION: This exam was performed according to the departmental dose-optimization program which includes automated exposure control, adjustment of the mA and/or kV according to patient size and/or use of iterative reconstruction technique. COMPLICATIONS: None FINDINGS: After advancing a needle into the left hepatic abscess, there was return of turbid, yellow fluid. After drainage catheter placement, a larger sample was withdrawn and sent for culture analysis. There is good return of turbid fluid from the drainage catheter after placement. IMPRESSION: CT-guided percutaneous catheter drainage of left lobe hepatic abscess. A 10 French percutaneous drain was placed. There was return of turbid, yellow fluid. A sample was sent for culture analysis. Electronically Signed   By: Aletta Edouard M.D.   On: 01/03/2022 15:51

## 2022-01-03 NOTE — Consult Note (Signed)
Chief Complaint: Patient was seen in consultation today for liver fluid collection  Referring Physician(s): Fayrene Helper, MD  Supervising Physician: Aletta Edouard  Patient Status: [facility]  History of Present Illness: Crystal Mora is a 46 y.o. 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 She had been admitted 5/12-5/18 for the same but left AMA. CT abd/pelvis w/contrast on 5/12 showed:  1. No CT evidence for acute pulmonary embolus. 2. 12 mm cavitary nodule identified right lower lobe. This may be infectious/inflammatory in etiology. Appearance of the nodule raises the question of septic embolus although no additional peripheral ill-defined nodules are seen in either lung. Given findings below, close follow-up will be required. 3. 3.3 x 2.7 x 2.6 cm complex multicystic lesion along the cranial margin of the pancreatic body extending up adjacent to the gastric fundus inferior to the esophagogastric junction. This is associated with a cluster of mildly enlarged lymph nodes in the same region. Imaging features may reflect an exophytic primary multicystic pancreatic process (pseudocyst or neoplasm). Cystic/necrotic lymph nodes from alternative source (lower esophagus/stomach) would also be a consideration. MRI abdomen with and without contrast might be able to add some additional insight, but upper endoscopy with endoscopic ultrasound would also be a consideration for further assessment 4. 4.2 cm benign appearing right adnexal cyst. No follow-up warranted.  There was concern for contained ruptured gastric viscera by GI and general surgery however no acute indication for intervention. UGI performed 5/15 did not show evidence of gastric perforation. She was planned to  have outpatient EGD and pulmonary follow up regarding cavitary lung lesion.   She re-presented 5/23 with the same complaints and CT chest/abd/pelvis w/contrast showed:  14 x 7 mm pleural based density is noted posteriorly in right lower lobe with cavitation, concerning for possible cavitating pneumonia or necrotic neoplasm. Follow-up CT scan in 2-3 weeks is recommended to ensure resolution or stability.   Significantly increased 6.6 x 4.9 cm subcapsular fluid collection along inferior aspect of left hepatic lobe concerning for abscess. There is adjacent wall and fold thickening of the distal stomach concerning for peptic ulcer disease or other inflammation.   Also noted are multiple fluid collections in the right side of the pelvis, the largest measuring 4.4 by 3.9 cm, concerning for tubo-ovarian abscess and possible pelvic inflammatory disease. Also noted is 5.8 x 3.8 cm fluid collection with enhancing wall margins in the pre rectal space concerning for abscess.   Mild thickening of pancreatic body is noted suggesting acute pancreatitis, with probable small pseudo cyst which is decreased compared to prior exam.  IR has been consulted for liver fluid collection aspiration/possible drain placement.   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 Authorizing Provider  amLODipine (NORVASC) 2.5 MG tablet Take 2.5 mg by mouth daily. 12/11/21  Yes [provider]  celecoxib (CELEBREX) 100 MG capsule Take 100 mg by mouth 2 (two) times daily. 01/02/22  Yes [provider]  cephALEXin (KEFLEX) 500 MG capsule Take 1 capsule (500 mg total) by mouth 2 (two) times daily for 10 days. 12/28/21 01/07/22 Yes Ezequiel Essex, MD  cetirizine (ZYRTEC) 10 MG tablet Take 10 mg by mouth daily.   Yes [provider]  diazepam (VALIUM) 5 MG tablet Take 5 mg by mouth in the morning, at noon, and at bedtime.   Yes [provider]  EPINEPHrine (EPIPEN 2-PAK) 0.3 mg/0.3 mL IJ SOAJ injection Inject 0.3 mLs (0.3 mg total) into the muscle once as needed for up to 1 dose (for severe allergic reaction). CAll 911 immediately if you have to use this medicine 09/24/18  Yes Baird Cancer, Vilinda Blanks, PA-C  estradiol (CLIMARA - DOSED IN MG/24 HR) 0.05 mg/24hr patch Place 0.05 mg onto the skin once a week. 11/14/21  Yes [provider]  famotidine (PEPCID) 20 MG tablet Take 1 tablet (20 mg total) by mouth 2 (two) times daily. Patient taking differently: Take 20 mg by mouth daily as needed for heartburn. 02/27/20  Yes Jacqulyn Cane, MD  gabapentin (NEURONTIN) 300 MG capsule Take 300 mg by mouth daily as needed (For feet pain). 01/16/21  Yes [provider]  HUMIRA PEN 80 MG/0.8ML PNKT Inject 80 mg into the skin every 14 (fourteen) days. 12/04/21  Yes [provider]  hydroxychloroquine (PLAQUENIL) 200 MG tablet Take 200 mg by mouth daily. 12/05/21  Yes [provider]  hydrOXYzine (ATARAX/VISTARIL) 25 MG tablet Take 1 every 4-6 hours as needed for itch or hives. (Caution: May cause drowsiness) Patient taking differently: Take 25 mg by mouth every 6 (six) hours as needed for itching. 02/27/20  Yes Jacqulyn Cane, MD  linezolid (ZYVOX) 600 MG tablet Take 1 tablet (600 mg total) by mouth 2 (two) times daily for 10  days. 12/28/21 01/07/22 Yes Ezequiel Essex, MD  losartan (COZAAR) 100 MG tablet Take 1 tablet by mouth daily. 11/07/21  Yes [provider]  methocarbamol (ROBAXIN) 500 MG tablet Take 1 tablet (500 mg total) by mouth 2 (two) times daily. Patient taking differently: Take 500 mg by mouth every 6 (six) hours as needed for muscle spasms. 12/16/21  Yes Blue, Soijett A, PA-C  omalizumab (XOLAIR) 150 MG/ML prefilled syringe Inject 150 mg into the skin every 14 (fourteen) days.   Yes [provider]  pantoprazole (PROTONIX) 20 MG tablet Take 1 tablet (20 mg total) by mouth daily. 12/28/21  Yes Ezequiel Essex, MD  progesterone (PROMETRIUM) 100 MG capsule Take 100 mg by mouth daily. 02/09/21  Yes [provider]  RESTASIS 0.05 % ophthalmic emulsion Place 1 drop into both eyes daily as needed for dry eyes. 12/20/21  Yes [provider]  rosuvastatin (CRESTOR) 5 MG tablet Take 5 mg by mouth daily. 10/13/21  Yes [provider]  VITAMIN D PO Take 1 tablet by mouth daily.   Yes [provider]  lidocaine (LIDODERM) 5 % Place 1 patch onto the skin daily. Remove & Discard patch within 12 hours or as directed by MD Patient not taking: Reported on 01/02/2022 12/16/21   Blue, Soijett A, PA-C  sucralfate (CARAFATE) 1 GM/10ML  suspension Take 10 mLs (1 g total) by mouth 4 (four) times daily -  with meals and at bedtime. Patient not taking: Reported on 01/02/2022 12/28/21   Ezequiel Essex, MD     Family History  Problem Relation Age of Onset   Depression Mother    Mental illness Mother    Asthma Mother    COPD Mother    Diabetes Mother    Mental illness Brother    Mental illness Daughter    Diabetes Maternal Aunt    Mental illness Maternal Aunt    Depression Maternal Aunt    Diabetes Maternal Grandmother     Social History   Socioeconomic History   Marital status: Divorced    Spouse name: Not on file   Number of children: Not on file   Years of education: Not  on file   Highest education level: Not on file  Occupational History   Not on file  Tobacco Use   Smoking status: Former    Packs/day: 0.50    Types: Cigars, Cigarettes   Smokeless tobacco: Never   Tobacco comments:    1 cigar daily  Vaping Use   Vaping Use: Every day   Substances: Flavoring  Substance and Sexual Activity   Alcohol use: Yes    Comment: Rare   Drug use: No   Sexual activity: Yes    Birth control/protection: Surgical  Other Topics Concern   Not on file  Social History Narrative   Not on file   Social Determinants of Health   Financial Resource Strain: Not on file  Food Insecurity: Not on file  Transportation Needs: Not on file  Physical Activity: Not on file  Stress: Not on file  Social Connections: Not on file     Review of Systems: A 12 point ROS discussed and pertinent positives are indicated in the HPI above.  All other systems are negative.  Review of Systems  Constitutional:  Negative for appetite change, chills and fever.  Respiratory:  Negative for cough and shortness of breath.   Cardiovascular:  Negative for chest pain.  Gastrointestinal:  Positive for abdominal pain. Negative for blood in stool, diarrhea, nausea and vomiting.  Musculoskeletal:  Positive for back pain (chronic).  Neurological:  Negative for dizziness and headaches.   Vital Signs: BP 124/79 (BP Location: Right Arm)   Pulse 93   Temp 98.5 F (36.9 C) (Oral)   Resp 16   SpO2 95%   Physical Exam Vitals and nursing note reviewed.  Constitutional:      General: She is not in acute distress. HENT:     Head: Normocephalic.     Mouth/Throat:     Mouth: Mucous membranes are moist.     Pharynx: Oropharynx is clear. No oropharyngeal exudate or posterior oropharyngeal erythema.  Eyes:     General: No scleral icterus. Cardiovascular:     Rate and Rhythm: Normal rate and regular rhythm.  Pulmonary:     Effort: Pulmonary effort is normal.     Breath sounds: Normal breath  sounds.  Abdominal:     General: There is no distension.     Palpations: Abdomen is soft.     Tenderness: There is no abdominal tenderness.  Skin:    General: Skin is warm and dry.     Coloration: Skin is not jaundiced.  Neurological:     Mental Status: She is alert and oriented to person, place, and time.  Psychiatric:  Mood and Affect: Mood normal.        Behavior: Behavior normal.        Thought Content: Thought content normal.        Judgment: Judgment normal.     MD Evaluation Airway: WNL Heart: WNL Abdomen: WNL Chest/ Lungs: WNL ASA  Classification: 2 Mallampati/Airway Score: One   Imaging: DG Chest 2 View  Result Date: 12/22/2021 CLINICAL DATA:  Chest pain and shortness of breath with exertion 2 days. Back spasms and headache today. EXAM: CHEST - 2 VIEW COMPARISON:  08/28/2014 FINDINGS: Lungs are adequately inflated without focal airspace consolidation or effusion. Subtle prominence of the perihilar markings. Cardiomediastinal silhouette and remainder of the exam is unchanged. IMPRESSION: Subtle prominence of the perihilar markings which may be due to minimal vascular congestion versus viral bronchitic process. Electronically Signed   By: Marin Olp M.D.   On: 12/22/2021 11:07   CT Angio Chest PE W and/or Wo Contrast  Result Date: 12/22/2021 CLINICAL DATA:  Hervey Ard central chest pain that radiates to the upper back. Shortness of breath and headache. Also with epigastric pain and nausea/vomiting. EXAM: CT ANGIOGRAPHY CHEST CT ABDOMEN AND PELVIS WITH CONTRAST TECHNIQUE: Multidetector CT imaging of the chest was performed using the standard protocol during bolus administration of intravenous contrast. Multiplanar CT image reconstructions and MIPs were obtained to evaluate the vascular anatomy. Multidetector CT imaging of the abdomen and pelvis was performed using the standard protocol during bolus administration of intravenous contrast. RADIATION DOSE REDUCTION: This exam  was performed according to the departmental dose-optimization program which includes automated exposure control, adjustment of the mA and/or kV according to patient size and/or use of iterative reconstruction technique. CONTRAST:  132mL OMNIPAQUE IOHEXOL 350 MG/ML SOLN COMPARISON:  Abdomen/pelvis CT 03/21/2013 FINDINGS: CTA CHEST FINDINGS Cardiovascular: Heart size upper normal. No substantial pericardial effusion. No thoracic aortic aneurysm. No substantial atherosclerosis of the thoracic aorta. There is no filling defect within the opacified pulmonary arteries to suggest the presence of an acute pulmonary embolus. Mediastinum/Nodes: No mediastinal lymphadenopathy. Upper normal lymph node identified in the right hilum at 10 mm short axis. No left hilar lymphadenopathy. The esophagus has normal imaging features. There is no axillary lymphadenopathy. Lungs/Pleura: 12 mm cavitary nodule identified right lower lobe on image 82/4. No other suspicious pulmonary nodule or mass. Dependent atelectasis noted in the lower lungs. No overt pulmonary edema or substantial pleural effusion. Musculoskeletal: No worrisome lytic or sclerotic osseous abnormality. Review of the MIP images confirms the above findings. CT ABDOMEN and PELVIS FINDINGS Hepatobiliary: No suspicious focal abnormality within the liver parenchyma. There is no evidence for gallstones, gallbladder wall thickening, or pericholecystic fluid. No intrahepatic or extrahepatic biliary dilation. Pancreas: 3.3 x 2.7 x 2.6 cm complex multicystic lesion is identified along the cranial margin of the pancreatic body extending up adjacent to the stomach inferior to the esophagogastric junction (axial 22/5). Multiple adjacent enhancing lymph nodes are associated measuring in the 10-12 mm size range for short axis measurement (well seen coronal 43/8). Spleen: No splenomegaly. No focal mass lesion. Adrenals/Urinary Tract: No adrenal nodule or mass. Kidneys unremarkable. No  evidence for hydroureter. Bladder is nondistended. Early excreted contrast noted in both ureters and the bladder, likely secondary to administration of trace contrast during loading of the power injector. Stomach/Bowel: Stomach is nondistended. Duodenum is normally positioned as is the ligament of Treitz. No small bowel wall thickening. No small bowel dilatation. The terminal ileum is normal. The appendix is normal. No gross colonic mass.  No colonic wall thickening. Vascular/Lymphatic: No abdominal aortic aneurysm as noted above, there is mild clustered lymphadenopathy in the region of the gastrohepatic ligament. No pelvic sidewall lymphadenopathy. Reproductive: The uterus is unremarkable. 4.2 cm benign appearing right adnexal cyst. Left ovary unremarkable. Other: No intraperitoneal free fluid. Musculoskeletal: No worrisome lytic or sclerotic osseous abnormality. Review of the MIP images confirms the above findings. IMPRESSION: 1. No CT evidence for acute pulmonary embolus. 2. 12 mm cavitary nodule identified right lower lobe. This may be infectious/inflammatory in etiology. Appearance of the nodule raises the question of septic embolus although no additional peripheral ill-defined nodules are seen in either lung. Given findings below, close follow-up will be required. 3. 3.3 x 2.7 x 2.6 cm complex multicystic lesion along the cranial margin of the pancreatic body extending up adjacent to the gastric fundus inferior to the esophagogastric junction. This is associated with a cluster of mildly enlarged lymph nodes in the same region. Imaging features may reflect an exophytic primary multicystic pancreatic process (pseudocyst or neoplasm). Cystic/necrotic lymph nodes from alternative source (lower esophagus/stomach) would also be a consideration. MRI abdomen with and without contrast might be able to add some additional insight, but upper endoscopy with endoscopic ultrasound would also be a consideration for further  assessment 4. 4.2 cm benign appearing right adnexal cyst. No follow-up warranted. Note: This recommendation excludes cysts stable ? 2 years, cysts previously characterized by [place] or MR, corpus luteum cysts and adnexal calcifications without associated soft tissue mass. This recommendation does not apply to premenarchal patients and to those with increased risk (genetic, family history, elevated tumor markers or other high-risk factors) of ovarian cancer. Reference: JACR 2020 Feb; 17(2):248-254 right Electronically Signed   By: Misty Stanley M.D.   On: 12/22/2021 14:21   CT ABDOMEN PELVIS W CONTRAST  Result Date: 12/25/2021 CLINICAL DATA:  Posterior gastric perforation with overlying complex pancreatic lesion, now with fever and leukocytosis EXAM: CT ABDOMEN AND PELVIS WITH CONTRAST TECHNIQUE: Multidetector CT imaging of the abdomen and pelvis was performed using the standard protocol following bolus administration of intravenous contrast. RADIATION DOSE REDUCTION: This exam was performed according to the departmental dose-optimization program which includes automated exposure control, adjustment of the mA and/or kV according to patient size and/or use of iterative reconstruction technique. CONTRAST:  120mL OMNIPAQUE IOHEXOL 300 MG/ML SOLN IV. No oral contrast. COMPARISON:  12/22/2021 FINDINGS: Lower chest: Dependent atelectasis posterior lower lobes Hepatobiliary: Tiny nonspecific low-attenuation focus liver image 22 unchanged. No additional hepatic abnormalities or biliary dilatation. Minimal edema adjacent to gallbladder and generally in upper abdomen. Pancreas: Peripancreatic edema at proximal tail. Somewhat ill-defined low-attenuation focus within pancreatic tail proximally, 11 x 10 x 12 mm, question pseudocyst, now unilocular and decreased in size since previous exam. Remainder of pancreas normal. No calcifications or ductal dilatation. No additional mass. Spleen: Normal appearance.  Small adjacent splenule.  Adrenals/Urinary Tract: Adrenal glands, kidneys, ureters, and bladder normal appearance Stomach/Bowel: Thickening of wall of gastric antrum especially posteriorly, extending into pylorus. Adjacent infiltration of lesser sac fat planes. Proximal stomach decompressed and otherwise unremarkable. Large and small bowel loops normal appearance. Normal appendix. Vascular/Lymphatic: Few scattered pelvic phleboliths. Vascular structures patent. Minimal atherosclerotic calcification aorta. Few scattered normal sized iliac axis and periportal lymph nodes as well as few LEFT para-aortic nodes, nonenlarged. Reproductive: Uterus and LEFT ovary normal appearance. Cyst identified in RIGHT ovary 4.2 x 2.9 cm; this is a simple appearing cyst and follow-up ultrasound in 3-6 months is recommended. Other: No free air  or hernia.  Trace free fluid in cul-de-sac. Musculoskeletal: Osseous structures unremarkable. IMPRESSION: Peripancreatic edema at proximal tail of pancreas consistent with acute pancreatitis. Somewhat ill-defined low-attenuation focus within proximal pancreatic tail , 11 x 10 x 12 mm, question pseudocyst, decreased in size since previous exam. Thickening of wall of gastric antrum especially posteriorly, extending into pylorus, could be related favor gastritis versus ulcer disease. 4.2 cm diameter RIGHT ovarian cyst, simple appearing by CT; follow-up ultrasound recommended in 3-6 months. Aortic Atherosclerosis (ICD10-I70.0). Electronically Signed   By: Lavonia Dana M.D.   On: 12/25/2021 08:36   CT ABDOMEN PELVIS W CONTRAST  Result Date: 12/22/2021 CLINICAL DATA:  Sharp central chest pain that radiates to the upper back. Shortness of breath and headache. Also with epigastric pain and nausea/vomiting. EXAM: CT ANGIOGRAPHY CHEST CT ABDOMEN AND PELVIS WITH CONTRAST TECHNIQUE: Multidetector CT imaging of the chest was performed using the standard protocol during bolus administration of intravenous contrast. Multiplanar CT  image reconstructions and MIPs were obtained to evaluate the vascular anatomy. Multidetector CT imaging of the abdomen and pelvis was performed using the standard protocol during bolus administration of intravenous contrast. RADIATION DOSE REDUCTION: This exam was performed according to the departmental dose-optimization program which includes automated exposure control, adjustment of the mA and/or kV according to patient size and/or use of iterative reconstruction technique. CONTRAST:  132mL OMNIPAQUE IOHEXOL 350 MG/ML SOLN COMPARISON:  Abdomen/pelvis CT 03/21/2013 FINDINGS: CTA CHEST FINDINGS Cardiovascular: Heart size upper normal. No substantial pericardial effusion. No thoracic aortic aneurysm. No substantial atherosclerosis of the thoracic aorta. There is no filling defect within the opacified pulmonary arteries to suggest the presence of an acute pulmonary embolus. Mediastinum/Nodes: No mediastinal lymphadenopathy. Upper normal lymph node identified in the right hilum at 10 mm short axis. No left hilar lymphadenopathy. The esophagus has normal imaging features. There is no axillary lymphadenopathy. Lungs/Pleura: 12 mm cavitary nodule identified right lower lobe on image 82/4. No other suspicious pulmonary nodule or mass. Dependent atelectasis noted in the lower lungs. No overt pulmonary edema or substantial pleural effusion. Musculoskeletal: No worrisome lytic or sclerotic osseous abnormality. Review of the MIP images confirms the above findings. CT ABDOMEN and PELVIS FINDINGS Hepatobiliary: No suspicious focal abnormality within the liver parenchyma. There is no evidence for gallstones, gallbladder wall thickening, or pericholecystic fluid. No intrahepatic or extrahepatic biliary dilation. Pancreas: 3.3 x 2.7 x 2.6 cm complex multicystic lesion is identified along the cranial margin of the pancreatic body extending up adjacent to the stomach inferior to the esophagogastric junction (axial 22/5). Multiple  adjacent enhancing lymph nodes are associated measuring in the 10-12 mm size range for short axis measurement (well seen coronal 43/8). Spleen: No splenomegaly. No focal mass lesion. Adrenals/Urinary Tract: No adrenal nodule or mass. Kidneys unremarkable. No evidence for hydroureter. Bladder is nondistended. Early excreted contrast noted in both ureters and the bladder, likely secondary to administration of trace contrast during loading of the power injector. Stomach/Bowel: Stomach is nondistended. Duodenum is normally positioned as is the ligament of Treitz. No small bowel wall thickening. No small bowel dilatation. The terminal ileum is normal. The appendix is normal. No gross colonic mass. No colonic wall thickening. Vascular/Lymphatic: No abdominal aortic aneurysm as noted above, there is mild clustered lymphadenopathy in the region of the gastrohepatic ligament. No pelvic sidewall lymphadenopathy. Reproductive: The uterus is unremarkable. 4.2 cm benign appearing right adnexal cyst. Left ovary unremarkable. Other: No intraperitoneal free fluid. Musculoskeletal: No worrisome lytic or sclerotic osseous abnormality. Review of the  MIP images confirms the above findings. IMPRESSION: 1. No CT evidence for acute pulmonary embolus. 2. 12 mm cavitary nodule identified right lower lobe. This may be infectious/inflammatory in etiology. Appearance of the nodule raises the question of septic embolus although no additional peripheral ill-defined nodules are seen in either lung. Given findings below, close follow-up will be required. 3. 3.3 x 2.7 x 2.6 cm complex multicystic lesion along the cranial margin of the pancreatic body extending up adjacent to the gastric fundus inferior to the esophagogastric junction. This is associated with a cluster of mildly enlarged lymph nodes in the same region. Imaging features may reflect an exophytic primary multicystic pancreatic process (pseudocyst or neoplasm). Cystic/necrotic lymph  nodes from alternative source (lower esophagus/stomach) would also be a consideration. MRI abdomen with and without contrast might be able to add some additional insight, but upper endoscopy with endoscopic ultrasound would also be a consideration for further assessment 4. 4.2 cm benign appearing right adnexal cyst. No follow-up warranted. Note: This recommendation excludes cysts stable ? 2 years, cysts previously characterized by [place] or MR, corpus luteum cysts and adnexal calcifications without associated soft tissue mass. This recommendation does not apply to premenarchal patients and to those with increased risk (genetic, family history, elevated tumor markers or other high-risk factors) of ovarian cancer. Reference: JACR 2020 Feb; 17(2):248-254 right Electronically Signed   By: Misty Stanley M.D.   On: 12/22/2021 14:21   DG Lumbar Spine 1 View  Result Date: 12/27/2021 CLINICAL DATA:  Low back pain for many years. No injury. History of sciatic nerve pain. EXAM: LUMBAR SPINE - 1 VIEW COMPARISON:  None Available. FINDINGS: Single AP view of the lumbar spine. Vertebral body heights are maintained. Sacroiliac joints are unremarkable. IMPRESSION: No acute abnormality on single AP view of the lumbar spine. Electronically Signed   By: Keane Police D.O.   On: 12/27/2021 15:12   CT CHEST ABDOMEN PELVIS W CONTRAST  Result Date: 01/02/2022 CLINICAL DATA:  Shortness of breath, gastric abdominal pain. EXAM: CT CHEST, ABDOMEN, AND PELVIS WITH CONTRAST TECHNIQUE: Multidetector CT imaging of the chest, abdomen and pelvis was performed following the standard protocol during bolus administration of intravenous contrast. RADIATION DOSE REDUCTION: This exam was performed according to the departmental dose-optimization program which includes automated exposure control, adjustment of the mA and/or kV according to patient size and/or use of iterative reconstruction technique. CONTRAST:  162mL OMNIPAQUE IOHEXOL 300 MG/ML  SOLN  COMPARISON:  Dec 25, 2021. FINDINGS: CT CHEST FINDINGS Cardiovascular: No significant vascular findings. Normal heart size. No pericardial effusion. Mediastinum/Nodes: Status post left thyroidectomy. Esophagus is unremarkable. No adenopathy is noted. Lungs/Pleura: No pneumothorax or pleural effusion is noted. Mild left posterior basilar subsegmental atelectasis is noted. Mild right middle lobe atelectasis is noted. 14 x 7 mm pleural based density is noted posteriorly in right lower lobe with cavitation, concerning for possible cavitating pneumonia or neoplasm. Musculoskeletal: No chest wall mass or suspicious bone lesions identified. CT ABDOMEN PELVIS FINDINGS Hepatobiliary: No gallstones or biliary dilatation is noted. 6.6 x 4.9 cm subcapsular fluid collection is seen along the inferior aspect of the left hepatic lobe which is significantly increased since prior exam and concerning for possible abscess. Pancreas: Pancreatic body is mildly thickened suggesting acute pancreatitis, with probable small pseudo cyst that measures 8 mm currently, which is decreased compared to prior exam. No ductal dilatation is noted. Spleen: Normal in size without focal abnormality. Adrenals/Urinary Tract: Adrenal glands are unremarkable. Kidneys are normal, without renal calculi, focal  lesion, or hydronephrosis. Bladder is unremarkable. Stomach/Bowel: There is wall and fold thickening involving the distal stomach concerning for peptic ulcer disease. No abnormal large or small bowel dilatation is noted. The appendix is unremarkable. Vascular/Lymphatic: No definite vascular abnormality is noted. Mildly enlarged periaortic lymph nodes are noted which most likely are inflammatory in etiology. Reproductive: Uterus is unremarkable. Left adnexal region is unremarkable. There is now noted multiple fluid collections in the right adnexal region, the largest measuring 4.4 x 3.9 cm. This is concerning for possible tubo-ovarian abscess. There is  also noted 5.8 x 3.8 cm fluid collection with enhancing margins in the pre rectal space of the pelvis concerning for abscess. Other: No definite hernia is noted. Musculoskeletal: No acute or significant osseous findings. IMPRESSION: 14 x 7 mm pleural based density is noted posteriorly in right lower lobe with cavitation, concerning for possible cavitating pneumonia or necrotic neoplasm. Follow-up CT scan in 2-3 weeks is recommended to ensure resolution or stability. Significantly increased 6.6 x 4.9 cm subcapsular fluid collection along inferior aspect of left hepatic lobe concerning for abscess. There is adjacent wall and fold thickening of the distal stomach concerning for peptic ulcer disease or other inflammation. Also noted are multiple fluid collections in the right side of the pelvis, the largest measuring 4.4 by 3.9 cm, concerning for tubo-ovarian abscess and possible pelvic inflammatory disease. Also noted is 5.8 x 3.8 cm fluid collection with enhancing wall margins in the pre rectal space concerning for abscess. Mild thickening of pancreatic body is noted suggesting acute pancreatitis, with probable small pseudo cyst which is decreased compared to prior exam. Electronically Signed   By: Marijo Conception M.D.   On: 01/02/2022 15:30   DG Chest Portable 1 View  Result Date: 01/02/2022 CLINICAL DATA:  RLL lesion; of breath abdominal pain EXAM: PORTABLE CHEST 1 VIEW COMPARISON:  CT dated Dec 22, 2021; radiograph dated Dec 25, 2021 FINDINGS: The cardiomediastinal silhouette is unchanged in contour.Unchanged mild elevation of the RIGHT hemidiaphragm. No pleural effusion. No pneumothorax. No acute pleuroparenchymal abnormality. Visualized abdomen is unremarkable. Previously described cavitary lesion is not visualized radiographically. IMPRESSION: Previously described cavitary lesion is not visualized radiographically. Electronically Signed   By: Valentino Saxon M.D.   On: 01/02/2022 13:44   DG CHEST PORT 1  VIEW  Result Date: 12/25/2021 CLINICAL DATA:  Fever and leukocytosis. EXAM: PORTABLE CHEST 1 VIEW COMPARISON:  CT chest and chest x-ray dated Dec 22, 2021. FINDINGS: The heart size and mediastinal contours are within normal limits. No focal consolidation, pleural effusion, or pneumothorax. No acute osseous abnormality. IMPRESSION: No active disease. Electronically Signed   By: Titus Dubin M.D.   On: 12/25/2021 09:57   DG UGI W SINGLE CM (SOL OR THIN BA)  Result Date: 12/25/2021 CLINICAL DATA:  Perforated gastric ulcer of posterior antrum, now with leukocytosis and fever EXAM: DG UGI W SINGLE CM TECHNIQUE: Scout radiograph was obtained. Single contrast examination was performed using thin water soluble contrast. This exam was performed by Pasty Spillers, PA-C, and was supervised and interpreted by P. Patel MD. FLUOROSCOPY TIME:  Radiation Exposure Index (as provided by the fluoroscopic device): 24.70mG y Ref. Air Kerma COMPARISON:  CT 5/15. FINDINGS: Scout radiograph demonstrates a normal bowel gas pattern. Contrast is swallowed without difficulty. Esophagus is grossly unremarkable. The stomach has an unremarkable single contrast appearance. There is no contrast extravasation to suggest perforation along the posterior wall. Limited evaluation for ulcer on this study. Visualized portions of the duodenum are unremarkable.  IMPRESSION: No evidence for gastric perforation. Electronically Signed   By: Macy Mis M.D.   On: 12/25/2021 15:44   ECHOCARDIOGRAM COMPLETE  Result Date: 12/27/2021    ECHOCARDIOGRAM REPORT   Patient Name:   Crystal Mora Date of Exam: 12/27/2021 Medical Rec #:  [id number]        Height:       66.0 in Accession #:    [id number]       Weight:       164.9 lb Date of Birth:  Oct 08, 1975        BSA:          1.842 m Patient Age:    48 years         BP:           143/97 mmHg Patient Gender: F                HR:           101 bpm. Exam Location:  Inpatient Procedure: 2D Echo, Cardiac  Doppler and Color Doppler Indications:    Murmur  History:        Patient has no prior history of Echocardiogram examinations.                 Risk Factors:Hypertension, Diabetes and HLD.  Sonographer:    Joette Catching RCS Referring Phys: [place]  1. Left ventricular ejection fraction, by estimation, is 60 to 65%. The left ventricle has normal function. The left ventricle has no regional wall motion abnormalities. Left ventricular diastolic parameters were normal.  2. Right ventricular systolic function is normal. The right ventricular size is normal. There is normal pulmonary artery systolic pressure.  3. The mitral valve is normal in structure. No evidence of mitral valve regurgitation. No evidence of mitral stenosis.  4. The aortic valve is tricuspid. Aortic valve regurgitation is not visualized. No aortic stenosis is present.  5. The inferior vena cava is normal in size with greater than 50% respiratory variability, suggesting right atrial pressure of 3 mmHg.  6. Cannot exclude a small PFO. Comparison(s): No prior Echocardiogram. Conclusion(s)/Recommendation(s): Normal biventricular function without evidence of hemodynamically significant valvular heart disease. FINDINGS  Left Ventricle: Left ventricular ejection fraction, by estimation, is 60 to 65%. The left ventricle has normal function. The left ventricle has no regional wall motion abnormalities. The left ventricular internal cavity size was normal in size. There is  no left ventricular hypertrophy. Left ventricular diastolic parameters were normal. Right Ventricle: The right ventricular size is normal. No increase in right ventricular wall thickness. Right ventricular systolic function is normal. There is normal pulmonary artery systolic pressure. The tricuspid regurgitant velocity is 2.18 m/s, and  with an assumed right atrial pressure of 3 mmHg, the estimated right ventricular systolic pressure is 51.0 mmHg. Left Atrium:  Left atrial size was normal in size. Right Atrium: Right atrial size was normal in size. Pericardium: Trivial pericardial effusion is present. Mitral Valve: The mitral valve is normal in structure. No evidence of mitral valve regurgitation. No evidence of mitral valve stenosis. Tricuspid Valve: The tricuspid valve is normal in structure. Tricuspid valve regurgitation is trivial. No evidence of tricuspid stenosis. Aortic Valve: The aortic valve is tricuspid. Aortic valve regurgitation is not visualized. No aortic stenosis is present. Aortic valve mean gradient measures 6.0 mmHg. Aortic valve peak gradient measures 11.2 mmHg. Aortic valve area, by VTI measures 2.48  cm. Pulmonic Valve: The pulmonic valve was grossly  normal. Pulmonic valve regurgitation is not visualized. No evidence of pulmonic stenosis. Aorta: The aortic root, ascending aorta, aortic arch and descending aorta are all structurally normal, with no evidence of dilitation or obstruction. Venous: The inferior vena cava is normal in size with greater than 50% respiratory variability, suggesting right atrial pressure of 3 mmHg. IAS/Shunts: Cannot exclude a small PFO.  LEFT VENTRICLE PLAX 2D LVIDd:         4.90 cm   Diastology LVIDs:         3.40 cm   LV e' medial:    9.46 cm/s LV PW:         1.00 cm   LV E/e' medial:  9.9 LV IVS:        1.00 cm   LV e' lateral:   11.90 cm/s LVOT diam:     2.00 cm   LV E/e' lateral: 7.9 LV SV:         67 LV SV Index:   36 LVOT Area:     3.14 cm  RIGHT VENTRICLE             IVC RV Basal diam:  1.70 cm     IVC diam: 1.60 cm RV Mid diam:    1.50 cm RV S prime:     14.50 cm/s TAPSE (M-mode): 2.0 cm LEFT ATRIUM             Index        RIGHT ATRIUM          Index LA diam:        3.10 cm 1.68 cm/m   RA Area:     6.91 cm LA Vol (A2C):   33.8 ml 18.35 ml/m  RA Volume:   8.08 ml  4.39 ml/m LA Vol (A4C):   37.7 ml 20.46 ml/m LA Biplane Vol: 38.1 ml 20.68 ml/m  AORTIC VALVE                     PULMONIC VALVE AV Area (Vmax):     2.18 cm      PV Vmax:       1.13 m/s AV Area (Vmean):   2.17 cm      PV Peak grad:  5.1 mmHg AV Area (VTI):     2.48 cm AV Vmax:           167.00 cm/s AV Vmean:          120.000 cm/s AV VTI:            0.269 m AV Peak Grad:      11.2 mmHg AV Mean Grad:      6.0 mmHg LVOT Vmax:         116.00 cm/s LVOT Vmean:        82.900 cm/s LVOT VTI:          0.212 m LVOT/AV VTI ratio: 0.79  AORTA Ao Root diam: 3.00 cm Ao Asc diam:  3.00 cm MITRAL VALVE               TRICUSPID VALVE MV Area (PHT): 7.51 cm    TR Peak grad:   19.0 mmHg MV Decel Time: 101 msec    TR Vmax:        218.00 cm/s MV E velocity: 94.10 cm/s MV A velocity: 69.40 cm/s  SHUNTS MV E/A ratio:  1.36        Systemic VTI:  0.21 m  Systemic Diam: 2.00 cm Buford Dresser MD Electronically signed by Buford Dresser MD Signature Date/Time: 12/27/2021/9:14:42 PM    Final    VAS [place] LOWER EXTREMITY VENOUS (DVT)  Result Date: 12/29/2021  Lower Venous DVT Study Patient Name:  Crystal Mora  Date of Exam:   12/28/2021 Medical Rec #: [id number]         Accession #:    [id number] Date of Birth: May 10, 1976         Patient Gender: F Patient Age:   32 years Exam Location:  [facility] Procedure:      VAS [place] LOWER EXTREMITY VENOUS (DVT) Referring Phys: MARSHALL CHAMBLISS --------------------------------------------------------------------------------  Indications: Fever unkown origin.  Comparison Study: No prior studies. Performing Technologist: Darlin Coco RDMS, RVT  Examination Guidelines: A complete evaluation includes B-mode imaging, spectral Doppler, color Doppler, and power Doppler as needed of all accessible portions of each vessel. Bilateral testing is considered an integral part of a complete examination. Limited examinations for reoccurring indications may be performed as noted. The reflux portion of the exam is performed with the patient in reverse Trendelenburg.   +---------+---------------+---------+-----------+----------+--------------+ RIGHT    CompressibilityPhasicitySpontaneityPropertiesThrombus Aging +---------+---------------+---------+-----------+----------+--------------+ CFV      Full           Yes      Yes                                 +---------+---------------+---------+-----------+----------+--------------+ SFJ      Full                                                        +---------+---------------+---------+-----------+----------+--------------+ FV Prox  Full                                                        +---------+---------------+---------+-----------+----------+--------------+ FV Mid   Full                                                        +---------+---------------+---------+-----------+----------+--------------+ FV DistalFull                                                        +---------+---------------+---------+-----------+----------+--------------+ PFV      Full                                                        +---------+---------------+---------+-----------+----------+--------------+ POP      Full           Yes      Yes                                 +---------+---------------+---------+-----------+----------+--------------+  PTV      Full                                                        +---------+---------------+---------+-----------+----------+--------------+ PERO     Full                                                        +---------+---------------+---------+-----------+----------+--------------+ Gastroc  Full                                                        +---------+---------------+---------+-----------+----------+--------------+   +---------+---------------+---------+-----------+----------+--------------+ LEFT     CompressibilityPhasicitySpontaneityPropertiesThrombus Aging  +---------+---------------+---------+-----------+----------+--------------+ CFV      Full           Yes      Yes                                 +---------+---------------+---------+-----------+----------+--------------+ SFJ      Full                                                        +---------+---------------+---------+-----------+----------+--------------+ FV Prox  Full                                                        +---------+---------------+---------+-----------+----------+--------------+ FV Mid   Full                                                        +---------+---------------+---------+-----------+----------+--------------+ FV DistalFull                                                        +---------+---------------+---------+-----------+----------+--------------+ PFV      Full                                                        +---------+---------------+---------+-----------+----------+--------------+ POP      Full           Yes      Yes                                 +---------+---------------+---------+-----------+----------+--------------+  PTV      Full                                                        +---------+---------------+---------+-----------+----------+--------------+ PERO     Full                                                        +---------+---------------+---------+-----------+----------+--------------+ Gastroc  Full                                                        +---------+---------------+---------+-----------+----------+--------------+     Summary: RIGHT: - There is no evidence of deep vein thrombosis in the lower extremity.  - No cystic structure found in the popliteal fossa.  LEFT: - There is no evidence of deep vein thrombosis in the lower extremity.  - No cystic structure found in the popliteal fossa.  *See table(s) above for measurements and observations. Electronically signed  by Orlie Pollen on 12/29/2021 at 5:10:25 PM.    Final    VAS [place] UPPER EXTREMITY VENOUS DUPLEX  Result Date: 12/29/2021 UPPER VENOUS STUDY  Patient Name:  Crystal Mora  Date of Exam:   12/28/2021 Medical Rec #: [id number]         Accession #:    [id number] Date of Birth: 1975-10-30         Patient Gender: F Patient Age:   62 years Exam Location:  [facility] Procedure:      VAS [place] UPPER EXTREMITY VENOUS DUPLEX Referring Phys: MARSHALL CHAMBLISS --------------------------------------------------------------------------------  Indications: Fever unknown origin Other Indications: SVT with IV placement. Comparison Study: No prior studies. Performing Technologist: Darlin Coco RDMS, RVT  Examination Guidelines: A complete evaluation includes B-mode imaging, spectral Doppler, color Doppler, and power Doppler as needed of all accessible portions of each vessel. Bilateral testing is considered an integral part of a complete examination. Limited examinations for reoccurring indications may be performed as noted.  Right Findings: +----------+------------+---------+-----------+----------+-------+ RIGHT     CompressiblePhasicitySpontaneousPropertiesSummary +----------+------------+---------+-----------+----------+-------+ IJV           Full       Yes       Yes                      +----------+------------+---------+-----------+----------+-------+ Subclavian               Yes       Yes                      +----------+------------+---------+-----------+----------+-------+ Axillary      Full       Yes       Yes                      +----------+------------+---------+-----------+----------+-------+ Brachial      Full                                          +----------+------------+---------+-----------+----------+-------+  Radial        Full                                          +----------+------------+---------+-----------+----------+-------+ Ulnar         Full                                           +----------+------------+---------+-----------+----------+-------+ Cephalic    Partial      Yes       Yes               Acute  +----------+------------+---------+-----------+----------+-------+ Basilic       Full                                          +----------+------------+---------+-----------+----------+-------+  Left Findings: +----------+------------+---------+-----------+----------+-------+ LEFT      CompressiblePhasicitySpontaneousPropertiesSummary +----------+------------+---------+-----------+----------+-------+ IJV           Full       Yes       Yes                      +----------+------------+---------+-----------+----------+-------+ Subclavian               Yes       Yes                      +----------+------------+---------+-----------+----------+-------+ Axillary      Full       Yes       Yes                      +----------+------------+---------+-----------+----------+-------+ Brachial      Full                                          +----------+------------+---------+-----------+----------+-------+ Radial        Full                                          +----------+------------+---------+-----------+----------+-------+ Ulnar         Full                                          +----------+------------+---------+-----------+----------+-------+ Cephalic      Full                                          +----------+------------+---------+-----------+----------+-------+ Basilic       Full                                          +----------+------------+---------+-----------+----------+-------+  Summary:  Right: No evidence of deep vein thrombosis in the  upper extremity. Findings consistent with acute superficial vein thrombosis involving the right cephalic vein at the level of the proximal to mid forearm.  Left: No evidence of deep vein thrombosis in the upper extremity. No  evidence of superficial vein thrombosis in the upper extremity.  *See table(s) above for measurements and observations.  Diagnosing physician: Orlie Pollen Electronically signed by Orlie Pollen on 12/29/2021 at 5:10:53 PM.    Final    [place] EKG SITE RITE  Result Date: 12/26/2021 If Site Rite image not attached, placement could not be confirmed due to current cardiac rhythm.   Labs:  CBC: Recent Labs    12/27/21 0101 12/28/21 0109 01/02/22 1329 01/02/22 2056  WBC 16.3* 10.8* 11.2* 10.9*  HGB 12.5 12.0 11.5* 10.9*  HCT 36.4 36.1 34.2* 33.5*  PLT 217 238 463* 484*    COAGS: No results for input(s): INR, APTT in the last 8760 hours.  BMP: Recent Labs    12/26/21 0558 12/27/21 0101 12/28/21 0109 01/02/22 1329 01/02/22 2056  NA 133* 134* 138 137  --   K 3.2* 3.3* 3.6 3.6  --   CL 103 102 105 101  --   CO2 23 24 24 29   --   GLUCOSE 187* 97 83 90  --   BUN <5* <5* <5* 7  --   CALCIUM 8.2* 7.9* 8.1* 7.9*  --   CREATININE 0.80 0.69 0.64 0.55 0.52  GFRNONAA >60 >60 >60 >60 >60    LIVER FUNCTION TESTS: Recent Labs    12/22/21 1020 12/26/21 0558 12/27/21 0101 01/02/22 1329  BILITOT 0.6 1.0 0.5 0.7  AST 12* 27 45* 20  ALT 13 16 28 19   ALKPHOS 68 78 73 105  PROT 6.8 6.0* 5.6* 6.7  ALBUMIN 3.1* 2.2* 2.1* 2.3*    TUMOR MARKERS: No results for input(s): AFPTM, CEA, CA199, CHROMGRNA in the last 8760 hours.  Assessment and Plan:  46 y/o F admitted 5/12-5/18 with complaints of abdominal pain, diarrhea, fevers, chills and malaise. Initial imaging showed multiple intra-abdominal fluid collections and subcapsular liver fluid collection suggestive of posterior perforated gastric ulcer given history of significant BC powder usage, UGI 5/15 showed no gastric perforation and she was planned to follow up with GI for EGD as an outpatient. She was also found to have a cavitary lung lesion which was to be followed by pulmonology as an outpatient. She left AMA on 5/18 before completing  full workup.   She again presented to the ED 5/23 with the same complaints and imaging showed significant increase in the subcapsular fluid collection along the inferior aspect of the left hepatic lobe concerning for abscess. IR has been consulted for aspiration/possible drain placement. Patient history and imaging reviewed by Dr. Kathlene Cote who approves procedure.  Risks and benefits discussed with the patient including bleeding, infection, damage to adjacent structures, bowel perforation/fistula connection, and sepsis.  All of the patient's questions were answered, patient is agreeable to proceed.  Consent signed and in chart.   Thank you for this interesting consult.  I greatly enjoyed meeting Crystal Mora and look forward to participating in their care.  A copy of this report was sent to the requesting provider on this date.  Electronically Signed: Joaquim Nam, PA-C 01/03/2022, 1:30 PM   I spent a total of 40 Minutes in face to face in clinical consultation, greater than 50% of which was counseling/coordinating care for liver abscess.

## 2022-01-03 NOTE — Plan of Care (Signed)
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
## 2022-01-04 ENCOUNTER — Encounter (HOSPITAL_COMMUNITY): Payer: Self-pay | Admitting: Family Medicine

## 2022-01-04 DIAGNOSIS — E1369 Other specified diabetes mellitus with other specified complication: Secondary | ICD-10-CM

## 2022-01-04 DIAGNOSIS — L732 Hidradenitis suppurativa: Secondary | ICD-10-CM

## 2022-01-04 DIAGNOSIS — J984 Other disorders of lung: Secondary | ICD-10-CM

## 2022-01-04 LAB — BASIC METABOLIC PANEL
Anion gap: 4 — ABNORMAL LOW (ref 5–15)
BUN: <5 mg/dL — ABNORMAL LOW (ref 6–20)
CO2: 27 mmol/L (ref 22–32)
Calcium: 8 mg/dL — ABNORMAL LOW (ref 8.9–10.3)
Chloride: 106 mmol/L (ref 98–111)
Creatinine, Ser: 0.58 mg/dL (ref 0.44–1.00)
GFR, Estimated: >60 mL/min (ref >60)
Glucose, Bld: 97 mg/dL (ref 70–99)
Potassium: 3.7 mmol/L (ref 3.5–5.1)
Sodium: 137 mmol/L (ref 135–145)

## 2022-01-04 LAB — CBC WITH DIFFERENTIAL/PLATELET
Abs Immature Granulocytes: 0.05 10*3/uL (ref 0.00–0.07)
Basophils Absolute: 0 10*3/uL (ref 0.0–0.1)
Basophils Relative: 0 %
Eosinophils Absolute: 0.1 10*3/uL (ref 0.0–0.5)
Eosinophils Relative: 1 %
HCT: 32.7 % — ABNORMAL LOW (ref 36.0–46.0)
Hemoglobin: 10.9 g/dL — ABNORMAL LOW (ref 12.0–15.0)
Immature Granulocytes: 1 %
Lymphocytes Relative: 16 %
Lymphs Abs: 1.2 10*3/uL (ref 0.7–4.0)
MCH: 28.8 pg (ref 26.0–34.0)
MCHC: 33.3 g/dL (ref 30.0–36.0)
MCV: 86.5 fL (ref 80.0–100.0)
Monocytes Absolute: 0.8 10*3/uL (ref 0.1–1.0)
Monocytes Relative: 10 %
Neutro Abs: 5.8 10*3/uL (ref 1.7–7.7)
Neutrophils Relative %: 72 %
Platelets: 454 10*3/uL — ABNORMAL HIGH (ref 150–400)
RBC: 3.78 MIL/uL — ABNORMAL LOW (ref 3.87–5.11)
RDW: 15.4 % (ref 11.5–15.5)
WBC: 8 10*3/uL (ref 4.0–10.5)
nRBC: 0 % (ref 0.0–0.2)

## 2022-01-04 NOTE — Progress Notes (Signed)
Crystal Mora for Infectious Disease  Date of Admission:  01/02/2022           Reason for visit: Follow up on abscesses  Current antibiotics: Ceftriaxone and Flagyl 5/24 -- present   ASSESSMENT:    46 y.o. female admitted with:  Multiple intra-abdominal abscesses: Seen by GI and surgery during recent admission and was felt to have a gastric viscera rupture initially contained.  Admitted now 5/23 with new developed abscesses. Status post IR drain placement into liver abscess (6.6x4.9cm) on 5/24.  Cultures pending.  CT abd/pelvis 5/23 also notable for multiple fluid collections in the right adnexal region and a 5.8x3.8cm collection in the pre-rectal pelvic space.   Right lower lobe cavitary lesion: 76mm cavitary nodule first noted on 12/22/21 CTA chest.  TTE at that time was negative for vegetation and blood cx negative.  Pulmonary evaluated and recommended repeat imaging in 8-12 weeks for interval follow up. Repeat imaging this admission again noted with recommendation for 2-3 week interval follow up.  She currently has no respiratory symptoms. Pancreatic pseudocyst lesion:  Decreased on follow up imaging.  Hidradenitis suppurativa: No current issues.  She is on Humira and Plaquenil as an outpatient. Penicillin allergy: Reports as an infant that her mom told her it "turned her into an old lady" and that she thinks she had a rash.  Pre-DM: A1c is 6.3.  RECOMMENDATIONS:    Continue ceftriaxone and flagyl Monitor drain output for timing of follow up imaging.  Output recorded as 58mL so far Follow abscess cultures Will follow   Principal Problem:   Abscess of multiple sites Active Problems:   Chronic urticaria   Hidradenitis suppurativa   Essential hypertension   Cavitary lesion of lung   Immunosuppressed status (HCC)   Peptic ulcer disease   Abnormal CT of the abdomen   Premature menopause on hormone replacement therapy   Anxiety   Diarrhea   Abdominal pain   SIRS  (systemic inflammatory response syndrome) (HCC)   Diabetes (HCC)   Dyslipidemia    MEDICATIONS:    Scheduled Meds:  diazepam  5 mg Oral TID   enoxaparin (LOVENOX) injection  40 mg Subcutaneous Q24H   metroNIDAZOLE  500 mg Oral Q12H   nicotine  14 mg Transdermal Daily   pantoprazole (PROTONIX) IV  40 mg Intravenous Q12H   rosuvastatin  5 mg Oral Daily   sodium chloride flush  10 mL Intracatheter Q8H   sucralfate  1 g Oral TID WC & HS   Continuous Infusions:  cefTRIAXone (ROCEPHIN)  IV Stopped (01/03/22 1157)   lactated ringers     PRN Meds:.acetaminophen **OR** acetaminophen, cycloSPORINE, gabapentin, HYDROcodone-acetaminophen, HYDROmorphone (DILAUDID) injection, hydrOXYzine, ondansetron **OR** ondansetron (ZOFRAN) IV  SUBJECTIVE:   24 hour events:  Status post IR drain placement No other events noted   Feeling better today.  Review of Systems  All other systems reviewed and are negative.    OBJECTIVE:   Blood pressure (!) 143/97, pulse 97, temperature 97.7 F (36.5 C), temperature source Oral, resp. rate 16, SpO2 97 %. There is no height or weight on file to calculate BMI.  Physical Exam Constitutional:      General: She is not in acute distress.    Appearance: Normal appearance.  HENT:     Head: Normocephalic and atraumatic.  Eyes:     Extraocular Movements: Extraocular movements intact.     Conjunctiva/sclera: Conjunctivae normal.  Pulmonary:     Effort: Pulmonary effort is  normal. No respiratory distress.  Abdominal:     General: There is no distension.     Palpations: Abdomen is soft.     Tenderness: There is no abdominal tenderness.     Comments: IR drain in place.   Musculoskeletal:        General: Normal range of motion.     Cervical back: Normal range of motion and neck supple.  Skin:    General: Skin is warm and dry.     Findings: No rash.  Neurological:     General: No focal deficit present.     Mental Status: She is alert and oriented to  person, place, and time.  Psychiatric:        Mood and Affect: Mood normal.        Behavior: Behavior normal.     Lab Results: Lab Results  Component Value Date   WBC 8.0 01/04/2022   HGB 10.9 (L) 01/04/2022   HCT 32.7 (L) 01/04/2022   MCV 86.5 01/04/2022   PLT 454 (H) 01/04/2022    Lab Results  Component Value Date   NA 137 01/04/2022   K 3.7 01/04/2022   CO2 27 01/04/2022   GLUCOSE 97 01/04/2022   BUN <5 (L) 01/04/2022   CREATININE 0.58 01/04/2022   CALCIUM 8.0 (L) 01/04/2022   GFRNONAA >60 01/04/2022   GFRAA >60 10/19/2016    Lab Results  Component Value Date   ALT 19 01/02/2022   AST 20 01/02/2022   ALKPHOS 105 01/02/2022   BILITOT 0.7 01/02/2022       Component Value Date/Time   CRP 33.2 (H) 01/02/2022 1340       Component Value Date/Time   ESRSEDRATE 86 (H) 01/02/2022 1329     I have reviewed the micro and lab results in Epic.  Imaging: CT CHEST ABDOMEN PELVIS W CONTRAST  Result Date: 01/02/2022 CLINICAL DATA:  Shortness of breath, gastric abdominal pain. EXAM: CT CHEST, ABDOMEN, AND PELVIS WITH CONTRAST TECHNIQUE: Multidetector CT imaging of the chest, abdomen and pelvis was performed following the standard protocol during bolus administration of intravenous contrast. RADIATION DOSE REDUCTION: This exam was performed according to the departmental dose-optimization program which includes automated exposure control, adjustment of the mA and/or kV according to patient size and/or use of iterative reconstruction technique. CONTRAST:  121mL OMNIPAQUE IOHEXOL 300 MG/ML  SOLN COMPARISON:  Dec 25, 2021. FINDINGS: CT CHEST FINDINGS Cardiovascular: No significant vascular findings. Normal heart size. No pericardial effusion. Mediastinum/Nodes: Status post left thyroidectomy. Esophagus is unremarkable. No adenopathy is noted. Lungs/Pleura: No pneumothorax or pleural effusion is noted. Mild left posterior basilar subsegmental atelectasis is noted. Mild right middle lobe  atelectasis is noted. 14 x 7 mm pleural based density is noted posteriorly in right lower lobe with cavitation, concerning for possible cavitating pneumonia or neoplasm. Musculoskeletal: No chest wall mass or suspicious bone lesions identified. CT ABDOMEN PELVIS FINDINGS Hepatobiliary: No gallstones or biliary dilatation is noted. 6.6 x 4.9 cm subcapsular fluid collection is seen along the inferior aspect of the left hepatic lobe which is significantly increased since prior exam and concerning for possible abscess. Pancreas: Pancreatic body is mildly thickened suggesting acute pancreatitis, with probable small pseudo cyst that measures 8 mm currently, which is decreased compared to prior exam. No ductal dilatation is noted. Spleen: Normal in size without focal abnormality. Adrenals/Urinary Tract: Adrenal glands are unremarkable. Kidneys are normal, without renal calculi, focal lesion, or hydronephrosis. Bladder is unremarkable. Stomach/Bowel: There is wall and fold thickening  involving the distal stomach concerning for peptic ulcer disease. No abnormal large or small bowel dilatation is noted. The appendix is unremarkable. Vascular/Lymphatic: No definite vascular abnormality is noted. Mildly enlarged periaortic lymph nodes are noted which most likely are inflammatory in etiology. Reproductive: Uterus is unremarkable. Left adnexal region is unremarkable. There is now noted multiple fluid collections in the right adnexal region, the largest measuring 4.4 x 3.9 cm. This is concerning for possible tubo-ovarian abscess. There is also noted 5.8 x 3.8 cm fluid collection with enhancing margins in the pre rectal space of the pelvis concerning for abscess. Other: No definite hernia is noted. Musculoskeletal: No acute or significant osseous findings. IMPRESSION: 14 x 7 mm pleural based density is noted posteriorly in right lower lobe with cavitation, concerning for possible cavitating pneumonia or necrotic neoplasm. Follow-up  CT scan in 2-3 weeks is recommended to ensure resolution or stability. Significantly increased 6.6 x 4.9 cm subcapsular fluid collection along inferior aspect of left hepatic lobe concerning for abscess. There is adjacent wall and fold thickening of the distal stomach concerning for peptic ulcer disease or other inflammation. Also noted are multiple fluid collections in the right side of the pelvis, the largest measuring 4.4 by 3.9 cm, concerning for tubo-ovarian abscess and possible pelvic inflammatory disease. Also noted is 5.8 x 3.8 cm fluid collection with enhancing wall margins in the pre rectal space concerning for abscess. Mild thickening of pancreatic body is noted suggesting acute pancreatitis, with probable small pseudo cyst which is decreased compared to prior exam. Electronically Signed   By: Marijo Conception M.D.   On: 01/02/2022 15:30   DG Chest Portable 1 View  Result Date: 01/02/2022 CLINICAL DATA:  RLL lesion; of breath abdominal pain EXAM: PORTABLE CHEST 1 VIEW COMPARISON:  CT dated Dec 22, 2021; radiograph dated Dec 25, 2021 FINDINGS: The cardiomediastinal silhouette is unchanged in contour.Unchanged mild elevation of the RIGHT hemidiaphragm. No pleural effusion. No pneumothorax. No acute pleuroparenchymal abnormality. Visualized abdomen is unremarkable. Previously described cavitary lesion is not visualized radiographically. IMPRESSION: Previously described cavitary lesion is not visualized radiographically. Electronically Signed   By: Valentino Saxon M.D.   On: 01/02/2022 13:44   CT IMAGE GUIDED DRAINAGE BY PERCUTANEOUS CATHETER  Result Date: 01/03/2022 CLINICAL DATA:  LEFT LOBE HEPATIC ABSCESS. EXAM: CT GUIDED CATHETER DRAINAGE OF HEPATIC ABSCESS ANESTHESIA/SEDATION: Moderate (conscious) sedation was employed during this procedure. A total of Versed 1.5 mg and Fentanyl 75 mcg was administered intravenously by radiology nursing. Moderate Sedation Time: 17 minutes. The patient's level  of consciousness and vital signs were monitored continuously by radiology nursing throughout the procedure under my direct supervision. PROCEDURE: The procedure, risks, benefits, and alternatives were explained to the patient. Questions regarding the procedure were encouraged and answered. The patient understands and consents to the procedure. A time out was performed prior to initiating the procedure. CT was performed in a supine position through the upper to mid abdomen. The anterior abdominal wall was prepped with chlorhexidine in a sterile fashion, and a sterile drape was applied covering the operative field. A sterile gown and sterile gloves were used for the procedure. Local anesthesia was provided with 1% Lidocaine. A 17 gauge trocar needle was advanced into the left lobe of the liver under CT guidance. After confirming needle tip position, a small amount of fluid was aspirated through the needle. A guidewire was advanced through the needle and the needle removed. The percutaneous tract was dilated over the wire to 10 [place] and  a 10 French percutaneous drainage catheter advanced over the wire. A fluid sample was aspirated from the drain and sent for culture analysis. Catheter position was confirmed by CT. The catheter was attached to a suction bulb and secured at the skin with a Prolene retention suture. A sterile dressing was applied at the catheter exit site. RADIATION DOSE REDUCTION: This exam was performed according to the departmental dose-optimization program which includes automated exposure control, adjustment of the mA and/or kV according to patient size and/or use of iterative reconstruction technique. COMPLICATIONS: None FINDINGS: After advancing a needle into the left hepatic abscess, there was return of turbid, yellow fluid. After drainage catheter placement, a larger sample was withdrawn and sent for culture analysis. There is good return of turbid fluid from the drainage catheter after  placement. IMPRESSION: CT-guided percutaneous catheter drainage of left lobe hepatic abscess. A 10 French percutaneous drain was placed. There was return of turbid, yellow fluid. A sample was sent for culture analysis. Electronically Signed   By: Aletta Edouard M.D.   On: 01/03/2022 15:51     Imaging independently reviewed in Epic.    Raynelle Highland [facility] [phone] pager 01/04/2022, 8:43 AM  I have personally spent 50 minutes involved in face-to-face and non-face-to-face activities for this patient on the day of the visit. Professional time spent includes the following activities: Preparing to see the patient (review of tests), Obtaining and/or reviewing separately obtained history (admission/discharge record), Performing a medically appropriate examination and/or evaluation , Ordering medications/tests/procedures, referring and communicating with other health care professionals, Documenting clinical information in the EMR, Independently interpreting results (not separately reported), Communicating results to the patient/family/caregiver, Counseling and educating the patient/family/caregiver and Care coordination (not separately reported).

## 2022-01-04 NOTE — Progress Notes (Cosign Needed)
Family Medicine Teaching Service Daily Progress Note Intern Pager: [phone]  Patient name: Crystal Mora Medical record number: [id number] Date of birth: 10-14-1975 Age: 46 y.o. Gender: female  Primary Care Provider: Kathreen Devoid, PA-C Consultants: ID, IR, Gyn, Code Status: Full  Pt Overview and Major Events to Date:  5/23-admitted- 5/24-IR consulted, hepatic abscess drainage   Assessment and Plan: Crystal Mora is a 46 year old female who is admitted for abdominal pain with fever in the setting of multiple intra-abdominal abscesses.  PMH is significant for bipolar disorder, hypertension, HLD, hidradenitis suppurativa  Abdominal tenderness/fever likely 2/2 multiple intra-abdominal abscess S/p hepatic abscess drain placement (5/24) Patient continues to remain afebrile vitals are overall stable.  She reports feeling better today.  On exam has mid lower abdominal and RLQ abdominal tenderness . Cultures without growth in 3 days and since fluid culture is without growth in less than 24 hours. ID recommend PICC line for 14 days IV antibiotic. Will likely d/c today after PICC line placement  -ID following, appreciate recs -GYN following appreciate recs -Continue metronidazole 5 mg twice daily -Continue ceftriaxone -Follow-up blood culture -Follow-up abscess fluid culture -Continue pain control regimen  Hypertension Blood pressure this morning was 125/86. Home medication include losartan 100 mg and amlodipine 2.5 mg. -Restart home BP medications at d/c  Hidradenitis suppurativa Chronic and stable.  Holding home Humira  RLL cavitary lung lesion Patient's respiratory status stable. O2 sating On RA. -Follow-up CT in 2-3 weeks   FEN/GI: Thin diet PPx: Lovenox Dispo:Home pending clinical improvement . Barriers include PICC line.   Subjective:  Patient reports she is doing well. Pain well controlled but still endorses abdominal tenderness  Objective: Temp:  [97.6 F  (36.4 C)-98 F (36.7 C)] 98 F (36.7 C) (05/25 0859) Pulse Rate:  [89-99] 90 (05/25 0859) Resp:  [14-20] 14 (05/25 1100) BP: (126-143)/(81-97) 128/89 (05/25 1100) SpO2:  [95 %-97 %] 96 % (05/25 0859) Physical Exam: General: Alert, well appearing, NAD HEENT: Atraumatic, MMM, No sclera icterus CV: RRR, no murmurs, normal S1/S2 Pulm: CTAB, good WOB on RA, no crackles or wheezing Abd: Soft, no distension, Mid lower and RLQ abdominal tenderness Skin: dry, warm Ext: No BLE edema, +2 Pedal and radial pulse.   Laboratory: Recent Labs  Lab 01/02/22 1329 01/02/22 2056 01/04/22 0409  WBC 11.2* 10.9* 8.0  HGB 11.5* 10.9* 10.9*  HCT 34.2* 33.5* 32.7*  PLT 463* 484* 454*   Recent Labs  Lab 01/02/22 1329 01/02/22 2056 01/04/22 0409  NA 137  --  137  K 3.6  --  3.7  CL 101  --  106  CO2 29  --  27  BUN 7  --  <5*  CREATININE 0.55 0.52 0.58  CALCIUM 7.9*  --  8.0*  PROT 6.7  --   --   BILITOT 0.7  --   --   ALKPHOS 105  --   --   ALT 19  --   --   AST 20  --   --   GLUCOSE 90  --  97    Imaging/Diagnostic Tests: No images  Alen Bleacher, MD 01/04/2022, 2:36 PM PGY-1, [place] Intern pager: [phone], text pages welcome

## 2022-01-04 NOTE — Progress Notes (Signed)
Referring Physician(s): Dr. Florene Glen   Supervising Physician: Mir, Sharen Heck  Patient Status:  [facility] - In-pt  Chief Complaint: Liver abscess s/p drain placement in IR 01/03/22   Subjective: Patient in bed resting with several family members in the room. Patient states she feels much better since getting the drain placed. No complaints related to the drain.   Allergies: Biaxin [clarithromycin], Morphine and related, Penicillins, Percocet [oxycodone-acetaminophen], Shrimp [shellfish allergy], Citalopram, and Topiramate  Medications: Prior to Admission medications   Medication Sig Start Date End Date Taking? Authorizing Provider  amLODipine (NORVASC) 2.5 MG tablet Take 2.5 mg by mouth daily. 12/11/21  Yes [provider]  celecoxib (CELEBREX) 100 MG capsule Take 100 mg by mouth 2 (two) times daily. 01/02/22  Yes [provider]  cephALEXin (KEFLEX) 500 MG capsule Take 1 capsule (500 mg total) by mouth 2 (two) times daily for 10 days. 12/28/21 01/07/22 Yes Ezequiel Essex, MD  cetirizine (ZYRTEC) 10 MG tablet Take 10 mg by mouth daily.   Yes [provider]  diazepam (VALIUM) 5 MG tablet Take 5 mg by mouth in the morning, at noon, and at bedtime.   Yes [provider]  EPINEPHrine (EPIPEN 2-PAK) 0.3 mg/0.3 mL IJ SOAJ injection Inject 0.3 mLs (0.3 mg total) into the muscle once as needed for up to 1 dose (for severe allergic reaction). CAll 911 immediately if you have to use this medicine 09/24/18  Yes Baird Cancer, Vilinda Blanks, PA-C  estradiol (CLIMARA - DOSED IN MG/24 HR) 0.05 mg/24hr patch Place 0.05 mg onto the skin once a week. 11/14/21  Yes [provider]  famotidine (PEPCID) 20 MG tablet Take 1 tablet (20 mg total) by mouth 2 (two) times daily. Patient taking differently: Take 20 mg by mouth daily as needed for heartburn. 02/27/20  Yes Jacqulyn Cane, MD  gabapentin (NEURONTIN) 300 MG capsule Take 300 mg by mouth daily as needed (For feet pain). 01/16/21  Yes  [provider]  HUMIRA PEN 80 MG/0.8ML PNKT Inject 80 mg into the skin every 14 (fourteen) days. 12/04/21  Yes [provider]  hydroxychloroquine (PLAQUENIL) 200 MG tablet Take 200 mg by mouth daily. 12/05/21  Yes [provider]  hydrOXYzine (ATARAX/VISTARIL) 25 MG tablet Take 1 every 4-6 hours as needed for itch or hives. (Caution: May cause drowsiness) Patient taking differently: Take 25 mg by mouth every 6 (six) hours as needed for itching. 02/27/20  Yes Jacqulyn Cane, MD  linezolid (ZYVOX) 600 MG tablet Take 1 tablet (600 mg total) by mouth 2 (two) times daily for 10 days. 12/28/21 01/07/22 Yes Ezequiel Essex, MD  losartan (COZAAR) 100 MG tablet Take 1 tablet by mouth daily. 11/07/21  Yes [provider]  methocarbamol (ROBAXIN) 500 MG tablet Take 1 tablet (500 mg total) by mouth 2 (two) times daily. Patient taking differently: Take 500 mg by mouth every 6 (six) hours as needed for muscle spasms. 12/16/21  Yes Blue, Soijett A, PA-C  omalizumab (XOLAIR) 150 MG/ML prefilled syringe Inject 150 mg into the skin every 14 (fourteen) days.   Yes [provider]  pantoprazole (PROTONIX) 20 MG tablet Take 1 tablet (20 mg total) by mouth daily. 12/28/21  Yes Ezequiel Essex, MD  progesterone (PROMETRIUM) 100 MG capsule Take 100 mg by mouth daily. 02/09/21  Yes [provider]  RESTASIS 0.05 % ophthalmic emulsion Place 1 drop into both eyes daily as needed for dry eyes. 12/20/21  Yes [provider]  rosuvastatin (CRESTOR) 5 MG  tablet Take 5 mg by mouth daily. 10/13/21  Yes [provider]  VITAMIN D PO Take 1 tablet by mouth daily.   Yes [provider]  lidocaine (LIDODERM) 5 % Place 1 patch onto the skin daily. Remove & Discard patch within 12 hours or as directed by MD Patient not taking: Reported on 01/02/2022 12/16/21   Blue, Soijett A, PA-C  sucralfate (CARAFATE) 1 GM/10ML suspension Take 10 mLs (1 g total) by mouth 4 (four) times  daily -  with meals and at bedtime. Patient not taking: Reported on 01/02/2022 12/28/21   Ezequiel Essex, MD     Vital Signs: BP 131/84 (BP Location: Right Arm)   Pulse 90   Temp 98 F (36.7 C)   Resp 18   SpO2 96%   Physical Exam Constitutional:      General: She is not in acute distress.    Appearance: She is not ill-appearing.  Pulmonary:     Effort: Pulmonary effort is normal.  Abdominal:     General: Bowel sounds are normal.     Palpations: Abdomen is soft.     Tenderness: There is abdominal tenderness.     Comments: Mid/upper abdominal drain to suction. Approximately 20 ml of thin, clear serosanguineous fluid in the bulb. Drain easily flushed with 5 ml NS. Dressing is clean, dry and intact. Skin insertion site is unremarkable.   Skin:    General: Skin is warm and dry.  Neurological:     Mental Status: She is alert and oriented to person, place, and time.    Imaging: CT CHEST ABDOMEN PELVIS W CONTRAST  Result Date: 01/02/2022 CLINICAL DATA:  Shortness of breath, gastric abdominal pain. EXAM: CT CHEST, ABDOMEN, AND PELVIS WITH CONTRAST TECHNIQUE: Multidetector CT imaging of the chest, abdomen and pelvis was performed following the standard protocol during bolus administration of intravenous contrast. RADIATION DOSE REDUCTION: This exam was performed according to the departmental dose-optimization program which includes automated exposure control, adjustment of the mA and/or kV according to patient size and/or use of iterative reconstruction technique. CONTRAST:  157mL OMNIPAQUE IOHEXOL 300 MG/ML  SOLN COMPARISON:  Dec 25, 2021. FINDINGS: CT CHEST FINDINGS Cardiovascular: No significant vascular findings. Normal heart size. No pericardial effusion. Mediastinum/Nodes: Status post left thyroidectomy. Esophagus is unremarkable. No adenopathy is noted. Lungs/Pleura: No pneumothorax or pleural effusion is noted. Mild left posterior basilar subsegmental atelectasis is noted. Mild right  middle lobe atelectasis is noted. 14 x 7 mm pleural based density is noted posteriorly in right lower lobe with cavitation, concerning for possible cavitating pneumonia or neoplasm. Musculoskeletal: No chest wall mass or suspicious bone lesions identified. CT ABDOMEN PELVIS FINDINGS Hepatobiliary: No gallstones or biliary dilatation is noted. 6.6 x 4.9 cm subcapsular fluid collection is seen along the inferior aspect of the left hepatic lobe which is significantly increased since prior exam and concerning for possible abscess. Pancreas: Pancreatic body is mildly thickened suggesting acute pancreatitis, with probable small pseudo cyst that measures 8 mm currently, which is decreased compared to prior exam. No ductal dilatation is noted. Spleen: Normal in size without focal abnormality. Adrenals/Urinary Tract: Adrenal glands are unremarkable. Kidneys are normal, without renal calculi, focal lesion, or hydronephrosis. Bladder is unremarkable. Stomach/Bowel: There is wall and fold thickening involving the distal stomach concerning for peptic ulcer disease. No abnormal large or small bowel dilatation is noted. The appendix is unremarkable. Vascular/Lymphatic: No definite vascular abnormality is noted. Mildly enlarged periaortic lymph nodes are noted which most likely are inflammatory  in etiology. Reproductive: Uterus is unremarkable. Left adnexal region is unremarkable. There is now noted multiple fluid collections in the right adnexal region, the largest measuring 4.4 x 3.9 cm. This is concerning for possible tubo-ovarian abscess. There is also noted 5.8 x 3.8 cm fluid collection with enhancing margins in the pre rectal space of the pelvis concerning for abscess. Other: No definite hernia is noted. Musculoskeletal: No acute or significant osseous findings. IMPRESSION: 14 x 7 mm pleural based density is noted posteriorly in right lower lobe with cavitation, concerning for possible cavitating pneumonia or necrotic  neoplasm. Follow-up CT scan in 2-3 weeks is recommended to ensure resolution or stability. Significantly increased 6.6 x 4.9 cm subcapsular fluid collection along inferior aspect of left hepatic lobe concerning for abscess. There is adjacent wall and fold thickening of the distal stomach concerning for peptic ulcer disease or other inflammation. Also noted are multiple fluid collections in the right side of the pelvis, the largest measuring 4.4 by 3.9 cm, concerning for tubo-ovarian abscess and possible pelvic inflammatory disease. Also noted is 5.8 x 3.8 cm fluid collection with enhancing wall margins in the pre rectal space concerning for abscess. Mild thickening of pancreatic body is noted suggesting acute pancreatitis, with probable small pseudo cyst which is decreased compared to prior exam. Electronically Signed   By: Marijo Conception M.D.   On: 01/02/2022 15:30   DG Chest Portable 1 View  Result Date: 01/02/2022 CLINICAL DATA:  RLL lesion; of breath abdominal pain EXAM: PORTABLE CHEST 1 VIEW COMPARISON:  CT dated Dec 22, 2021; radiograph dated Dec 25, 2021 FINDINGS: The cardiomediastinal silhouette is unchanged in contour.Unchanged mild elevation of the RIGHT hemidiaphragm. No pleural effusion. No pneumothorax. No acute pleuroparenchymal abnormality. Visualized abdomen is unremarkable. Previously described cavitary lesion is not visualized radiographically. IMPRESSION: Previously described cavitary lesion is not visualized radiographically. Electronically Signed   By: Valentino Saxon M.D.   On: 01/02/2022 13:44   CT IMAGE GUIDED DRAINAGE BY PERCUTANEOUS CATHETER  Result Date: 01/03/2022 CLINICAL DATA:  LEFT LOBE HEPATIC ABSCESS. EXAM: CT GUIDED CATHETER DRAINAGE OF HEPATIC ABSCESS ANESTHESIA/SEDATION: Moderate (conscious) sedation was employed during this procedure. A total of Versed 1.5 mg and Fentanyl 75 mcg was administered intravenously by radiology nursing. Moderate Sedation Time: 17 minutes.  The patient's level of consciousness and vital signs were monitored continuously by radiology nursing throughout the procedure under my direct supervision. PROCEDURE: The procedure, risks, benefits, and alternatives were explained to the patient. Questions regarding the procedure were encouraged and answered. The patient understands and consents to the procedure. A time out was performed prior to initiating the procedure. CT was performed in a supine position through the upper to mid abdomen. The anterior abdominal wall was prepped with chlorhexidine in a sterile fashion, and a sterile drape was applied covering the operative field. A sterile gown and sterile gloves were used for the procedure. Local anesthesia was provided with 1% Lidocaine. A 17 gauge trocar needle was advanced into the left lobe of the liver under CT guidance. After confirming needle tip position, a small amount of fluid was aspirated through the needle. A guidewire was advanced through the needle and the needle removed. The percutaneous tract was dilated over the wire to 10 [place] and a 10 French percutaneous drainage catheter advanced over the wire. A fluid sample was aspirated from the drain and sent for culture analysis. Catheter position was confirmed by CT. The catheter was attached to a suction bulb and secured at the  skin with a Prolene retention suture. A sterile dressing was applied at the catheter exit site. RADIATION DOSE REDUCTION: This exam was performed according to the departmental dose-optimization program which includes automated exposure control, adjustment of the mA and/or kV according to patient size and/or use of iterative reconstruction technique. COMPLICATIONS: None FINDINGS: After advancing a needle into the left hepatic abscess, there was return of turbid, yellow fluid. After drainage catheter placement, a larger sample was withdrawn and sent for culture analysis. There is good return of turbid fluid from the drainage  catheter after placement. IMPRESSION: CT-guided percutaneous catheter drainage of left lobe hepatic abscess. A 10 French percutaneous drain was placed. There was return of turbid, yellow fluid. A sample was sent for culture analysis. Electronically Signed   By: Aletta Edouard M.D.   On: 01/03/2022 15:51    Labs:  CBC: Recent Labs    12/28/21 0109 01/02/22 1329 01/02/22 2056 01/04/22 0409  WBC 10.8* 11.2* 10.9* 8.0  HGB 12.0 11.5* 10.9* 10.9*  HCT 36.1 34.2* 33.5* 32.7*  PLT 238 463* 484* 454*    COAGS: No results for input(s): INR, APTT in the last 8760 hours.  BMP: Recent Labs    12/27/21 0101 12/28/21 0109 01/02/22 1329 01/02/22 2056 01/04/22 0409  NA 134* 138 137  --  137  K 3.3* 3.6 3.6  --  3.7  CL 102 105 101  --  106  CO2 24 24 29   --  27  GLUCOSE 97 83 90  --  97  BUN <5* <5* 7  --  <5*  CALCIUM 7.9* 8.1* 7.9*  --  8.0*  CREATININE 0.69 0.64 0.55 0.52 0.58  GFRNONAA >60 >60 >60 >60 >60    LIVER FUNCTION TESTS: Recent Labs    12/22/21 1020 12/26/21 0558 12/27/21 0101 01/02/22 1329  BILITOT 0.6 1.0 0.5 0.7  AST 12* 27 45* 20  ALT 13 16 28 19   ALKPHOS 68 78 73 105  PROT 6.8 6.0* 5.6* 6.7  ALBUMIN 3.1* 2.2* 2.1* 2.3*    Assessment and Plan:  Liver abscess s/p drain placement in IR 01/03/22  Patient feeling much better today. She is afebrile and without leukocytosis. Patient is unsure of discharge date. 85 ml output documented on Epic with another 20 ml in bulb during exam.   Drain Location: upper/mid abdomen Size: Fr size: 10 Fr Date of placement: 01/03/22 Currently to: Drain collection device: suction bulb 24 hour output:  Output by Drain (mL) 01/02/22 0701 - 01/02/22 1900 01/02/22 1901 - 01/03/22 0700 01/03/22 0701 - 01/03/22 1900 01/03/22 1901 - 01/04/22 0700 01/04/22 0701 - 01/04/22 1034  Closed System Drain 1 Right;Anterior;Midline Abdomen Bulb (JP) 10 Fr.   50 35     Interval imaging/drain manipulation:  None  Current  examination: Flushes/aspirates easily.  Insertion site unremarkable. Suture and stat lock in place. Dressed appropriately.   Plan: Continue TID flushes with 5 cc NS. Record output Q shift. Dressing changes QD or PRN if soiled.  Call IR APP or on call IR MD if difficulty flushing or sudden change in drain output.  Repeat imaging/possible drain injection once output < 10 mL/QD (excluding flush material.)  Discharge planning: Please contact IR APP or on call IR MD prior to patient d/c to ensure appropriate follow up plans are in place. Typically patient will follow up with IR clinic 10-14 days post d/c for repeat imaging/possible drain injection. IR scheduler will contact patient with date/time of appointment. Patient will need to  flush drain QD with 5 cc NS, record output QD, dressing changes every 2-3 days or earlier if soiled.   IR will continue to follow - please call with questions or concerns.  Electronically Signed: Soyla Dryer, AGACNP-BC [phone] 01/04/2022, 10:19 AM   I spent a total of 15 Minutes at the the patient's bedside AND on the patient's hospital floor or unit, greater than 50% of which was counseling/coordinating care for liver abscess drain.

## 2022-01-04 NOTE — Progress Notes (Signed)
FPTS Brief Progress Note  S:Patient sleeping   O: BP 126/88 (BP Location: Right Arm)   Pulse 94   Temp 97.8 F (36.6 C) (Oral)   Resp 20   SpO2 96%     A/P:  Continue current management   - Orders reviewed. Labs for AM ordered, which was adjusted as needed.    Shary Key, DO 01/04/2022, 12:27 AM PGY-2, [facility] Night Resident  Please page [phone] with questions.

## 2022-01-04 NOTE — Plan of Care (Signed)

## 2022-01-05 ENCOUNTER — Other Ambulatory Visit: Payer: Self-pay | Admitting: Family Medicine

## 2022-01-05 ENCOUNTER — Other Ambulatory Visit (HOSPITAL_COMMUNITY): Payer: Self-pay

## 2022-01-05 ENCOUNTER — Inpatient Hospital Stay: Payer: Self-pay

## 2022-01-05 LAB — CBC WITH DIFFERENTIAL/PLATELET
Abs Immature Granulocytes: 0.05 10*3/uL (ref 0.00–0.07)
Basophils Absolute: 0 10*3/uL (ref 0.0–0.1)
Basophils Relative: 0 %
Eosinophils Absolute: 0.1 10*3/uL (ref 0.0–0.5)
Eosinophils Relative: 1 %
HCT: 34.5 % — ABNORMAL LOW (ref 36.0–46.0)
Hemoglobin: 11.2 g/dL — ABNORMAL LOW (ref 12.0–15.0)
Immature Granulocytes: 1 %
Lymphocytes Relative: 15 %
Lymphs Abs: 1.2 10*3/uL (ref 0.7–4.0)
MCH: 28.5 pg (ref 26.0–34.0)
MCHC: 32.5 g/dL (ref 30.0–36.0)
MCV: 87.8 fL (ref 80.0–100.0)
Monocytes Absolute: 0.8 10*3/uL (ref 0.1–1.0)
Monocytes Relative: 10 %
Neutro Abs: 6.1 10*3/uL (ref 1.7–7.7)
Neutrophils Relative %: 73 %
Platelets: 451 10*3/uL — ABNORMAL HIGH (ref 150–400)
RBC: 3.93 MIL/uL (ref 3.87–5.11)
RDW: 15.4 % (ref 11.5–15.5)
WBC: 8.3 10*3/uL (ref 4.0–10.5)
nRBC: 0 % (ref 0.0–0.2)

## 2022-01-05 LAB — BASIC METABOLIC PANEL
Anion gap: 5 (ref 5–15)
BUN: <5 mg/dL — ABNORMAL LOW (ref 6–20)
CO2: 26 mmol/L (ref 22–32)
Calcium: 8.2 mg/dL — ABNORMAL LOW (ref 8.9–10.3)
Chloride: 104 mmol/L (ref 98–111)
Creatinine, Ser: 0.63 mg/dL (ref 0.44–1.00)
GFR, Estimated: >60 mL/min (ref >60)
Glucose, Bld: 93 mg/dL (ref 70–99)
Potassium: 3.9 mmol/L (ref 3.5–5.1)
Sodium: 135 mmol/L (ref 135–145)

## 2022-01-05 LAB — CYTOLOGY - NON PAP

## 2022-01-05 MED ORDER — SODIUM CHLORIDE 0.9% FLUSH: 10.0000 mL | Freq: Two times a day (BID) | INTRAVENOUS | Status: DC

## 2022-01-05 MED ORDER — HYDROCODONE-ACETAMINOPHEN 5-325 MG PO TABS: 1.0000 | ORAL_TABLET | ORAL | 0 refills | Status: AC | PRN

## 2022-01-05 MED ORDER — EPINEPHRINE 0.3 MG/0.3ML IJ SOAJ: 0.3000 mg | Freq: Once | INTRAMUSCULAR | Status: DC | PRN

## 2022-01-05 MED ORDER — METRONIDAZOLE 500 MG PO TABS: 500.0000 mg | ORAL_TABLET | Freq: Two times a day (BID) | ORAL | 0 refills | Status: DC

## 2022-01-05 MED ORDER — DIPHENHYDRAMINE HCL 50 MG/ML IJ SOLN: 25.0000 mg | Freq: Once | INTRAMUSCULAR | Status: DC | PRN

## 2022-01-05 MED ORDER — SODIUM CHLORIDE 0.9% FLUSH: 10.0000 mL | INTRAVENOUS | Status: DC | PRN

## 2022-01-05 MED ORDER — AMOXICILLIN 500 MG PO CAPS
500.0000 mg | ORAL_CAPSULE | Freq: Once | ORAL | Status: AC
  Administered 2022-01-05: 500 mg via ORAL
  Filled 2022-01-05: qty 1

## 2022-01-05 MED ORDER — CHLORHEXIDINE GLUCONATE CLOTH 2 % EX PADS: 6.0000 | MEDICATED_PAD | Freq: Every day | CUTANEOUS | Status: DC

## 2022-01-05 MED ORDER — NORMAL SALINE FLUSH 0.9 % IV SOLN
5.0000 mL | Freq: Every day | INTRAVENOUS | 0 refills | Status: DC
  Filled 2022-01-05: qty 300, 30d supply, fill #0

## 2022-01-05 MED ORDER — CEFTRIAXONE IV (FOR PTA / DISCHARGE USE ONLY): 2.0000 g | INTRAVENOUS | 0 refills | Status: DC

## 2022-01-05 MED ORDER — HYDROCODONE-ACETAMINOPHEN 5-325 MG PO TABS: 1.0000 | ORAL_TABLET | ORAL | 0 refills | Status: DC | PRN

## 2022-01-05 NOTE — Progress Notes (Signed)
[place] for Infectious Disease  Date of Admission:  01/02/2022           Reason for visit: Follow up on abscesses   Current antibiotics: Ceftriaxone and Flagyl 5/24 -- present   ASSESSMENT:    46 y.o. female admitted with:  Multiple intra-abdominal abscesses: Seen by GI and surgery during recent admission and was felt to have a gastric viscera rupture initially contained.  Admitted now 5/23 with new developed abscesses. Status post IR drain placement into liver abscess (6.6x4.9cm) on 5/24.  Cultures NGTD.  CT abd/pelvis 5/23 also notable for multiple fluid collections in the right adnexal region and a 5.8x3.8cm collection in the pre-rectal pelvic space.   Right lower lobe cavitary lesion: 63m cavitary nodule first noted on 12/22/21 CTA chest.  TTE at that time was negative for vegetation and blood cx negative.  Pulmonary evaluated and recommended repeat imaging in 8-12 weeks for interval follow up. Repeat imaging this admission again noted with recommendation for 2-3 week interval follow up.  She currently has no respiratory symptoms. Pancreatic pseudocyst lesion:  Decreased on follow up imaging.  Hidradenitis suppurativa: No current issues.  She is on Humira and Plaquenil as an outpatient. Penicillin allergy: Reports as an infant that her mom told her it "turned her into an old lady" and that she thinks she had a rash.  She reports prior tolerance of amoxicillin.    RECOMMENDATIONS:    Continue ceftriaxone and flagyl Place PICC Will give her a dose of amoxicillin today to ensure tolerance to clarify her allergy history in case we need to switch to Unasyn or Zosyn after discharge Okay to DC once okay with primary team, PICC placed and HH has been arranged.  I can adjust antibiotics as an outpatient based on cultures if needed Will need follow up at discharge also with IR for drain management See OPAT below  Diagnosis: Abdominal abscess  Culture Result:  pending  Allergies  Allergen Reactions   Biaxin [Clarithromycin]    Morphine And Related Hives    Says it is okay with benadryl administration   Penicillins Hives, Itching and Swelling    Can tolerate cephalosporins    Percocet [Oxycodone-Acetaminophen] Hives and Itching   Shrimp [Shellfish Allergy]    Citalopram Anxiety    aggression   Topiramate Rash    OPAT Orders Discharge antibiotics to be given via PICC line Discharge antibiotics: Per pharmacy protocol  Ceftriaxone 2gm daily IV Metronidazole 5019mBID PO  Duration: 4 weeks End Date: 02/02/22  [facility]:  Home health RN for IV administration and teaching; PICC line care and labs.    Labs weekly while on IV antibiotics: _xx_ CBC with differential __ BMP _xx_ CMP __ CRP __ ESR __ Vancomycin trough __ CK  _xx_ Please pull PIC at completion of IV antibiotics __ Please leave PIC in place until doctor has seen patient or been notified  Fax weekly labs to ([phone] Follow Up Appt: 01/19/22 at 4pm with Dr WaJuleen China  Principal Problem:   Abscess of multiple sites Active Problems:   Chronic urticaria   Hidradenitis suppurativa   Essential hypertension   Cavitary lesion of lung   Immunosuppressed status ([facility]  Peptic ulcer disease   Abnormal CT of the abdomen   Premature menopause on hormone replacement therapy   Anxiety   Diarrhea   Abdominal pain   SIRS (systemic inflammatory response syndrome) ([facility]  Diabetes (  [place])   Dyslipidemia    MEDICATIONS:    Scheduled Meds:  diazepam  5 mg Oral TID   enoxaparin (LOVENOX) injection  40 mg Subcutaneous Q24H   metroNIDAZOLE  500 mg Oral Q12H   nicotine  14 mg Transdermal Daily   pantoprazole (PROTONIX) IV  40 mg Intravenous Q12H   rosuvastatin  5 mg Oral Daily   sodium chloride flush  10 mL Intracatheter Q8H   sucralfate  1 g Oral TID WC & HS   Continuous Infusions:  cefTRIAXone (ROCEPHIN)  IV 2 g (01/04/22 1358)   lactated  ringers     PRN Meds:.acetaminophen **OR** acetaminophen, cycloSPORINE, gabapentin, HYDROcodone-acetaminophen, HYDROmorphone (DILAUDID) injection, hydrOXYzine, ondansetron **OR** ondansetron (ZOFRAN) IV  SUBJECTIVE:   24 hour events:  No acute events have been noted overnight Tmax 98.7 WBC normal Continued on Ceftriaxone/Flagyl Cx are NGTD  She is doing well.  She really would like to go home today if possible.  She has no fevers.  She states she has had amoxicillin in the past.   Review of Systems  All other systems reviewed and are negative.    OBJECTIVE:   Blood pressure 125/86, pulse 95, temperature 98.6 F (37 C), temperature source Oral, resp. rate 18, weight 78.7 kg, SpO2 97 %. Body mass index is 28 kg/m.  Physical Exam Constitutional:      General: She is not in acute distress.    Appearance: She is well-developed.  HENT:     Head: Normocephalic and atraumatic.  Eyes:     Extraocular Movements: Extraocular movements intact.     Conjunctiva/sclera: Conjunctivae normal.  Pulmonary:     Effort: Pulmonary effort is normal. No respiratory distress.  Abdominal:     General: There is no distension.     Palpations: Abdomen is soft.     Comments: IR drain in place.   Musculoskeletal:        General: Normal range of motion.     Cervical back: Normal range of motion and neck supple.  Skin:    General: Skin is warm and dry.  Neurological:     General: No focal deficit present.     Mental Status: She is alert and oriented to person, place, and time.  Psychiatric:        Mood and Affect: Mood normal.        Behavior: Behavior normal.     Lab Results: Lab Results  Component Value Date   WBC 8.3 01/05/2022   HGB 11.2 (L) 01/05/2022   HCT 34.5 (L) 01/05/2022   MCV 87.8 01/05/2022   PLT 451 (H) 01/05/2022    Lab Results  Component Value Date   NA 135 01/05/2022   K 3.9 01/05/2022   CO2 26 01/05/2022   GLUCOSE 93 01/05/2022   BUN <5 (L) 01/05/2022    CREATININE 0.63 01/05/2022   CALCIUM 8.2 (L) 01/05/2022   GFRNONAA >60 01/05/2022   GFRAA >60 10/19/2016    Lab Results  Component Value Date   ALT 19 01/02/2022   AST 20 01/02/2022   ALKPHOS 105 01/02/2022   BILITOT 0.7 01/02/2022       Component Value Date/Time   CRP 33.2 (H) 01/02/2022 1340       Component Value Date/Time   ESRSEDRATE 86 (H) 01/02/2022 1329     I have reviewed the micro and lab results in Epic.  Imaging: CT IMAGE GUIDED DRAINAGE BY PERCUTANEOUS CATHETER  Result Date: 01/03/2022 CLINICAL DATA:  LEFT LOBE HEPATIC  ABSCESS. EXAM: CT GUIDED CATHETER DRAINAGE OF HEPATIC ABSCESS ANESTHESIA/SEDATION: Moderate (conscious) sedation was employed during this procedure. A total of Versed 1.5 mg and Fentanyl 75 mcg was administered intravenously by radiology nursing. Moderate Sedation Time: 17 minutes. The patient's level of consciousness and vital signs were monitored continuously by radiology nursing throughout the procedure under my direct supervision. PROCEDURE: The procedure, risks, benefits, and alternatives were explained to the patient. Questions regarding the procedure were encouraged and answered. The patient understands and consents to the procedure. A time out was performed prior to initiating the procedure. CT was performed in a supine position through the upper to mid abdomen. The anterior abdominal wall was prepped with chlorhexidine in a sterile fashion, and a sterile drape was applied covering the operative field. A sterile gown and sterile gloves were used for the procedure. Local anesthesia was provided with 1% Lidocaine. A 17 gauge trocar needle was advanced into the left lobe of the liver under CT guidance. After confirming needle tip position, a small amount of fluid was aspirated through the needle. A guidewire was advanced through the needle and the needle removed. The percutaneous tract was dilated over the wire to 10 [place] and a 10 French percutaneous  drainage catheter advanced over the wire. A fluid sample was aspirated from the drain and sent for culture analysis. Catheter position was confirmed by CT. The catheter was attached to a suction bulb and secured at the skin with a Prolene retention suture. A sterile dressing was applied at the catheter exit site. RADIATION DOSE REDUCTION: This exam was performed according to the departmental dose-optimization program which includes automated exposure control, adjustment of the mA and/or kV according to patient size and/or use of iterative reconstruction technique. COMPLICATIONS: None FINDINGS: After advancing a needle into the left hepatic abscess, there was return of turbid, yellow fluid. After drainage catheter placement, a larger sample was withdrawn and sent for culture analysis. There is good return of turbid fluid from the drainage catheter after placement. IMPRESSION: CT-guided percutaneous catheter drainage of left lobe hepatic abscess. A 10 French percutaneous drain was placed. There was return of turbid, yellow fluid. A sample was sent for culture analysis. Electronically Signed   By: Aletta Edouard M.D.   On: 01/03/2022 15:51     Imaging independently reviewed in Epic.    [facility] for Infectious [facility] [phone] pager 01/05/2022, 8:35 AM

## 2022-01-05 NOTE — Discharge Summary (Signed)
[facility] Discharge Summary  Patient name: Crystal Mora Medical record number: [id number] Date of birth: 09/26/1975 Age: 46 y.o. Gender: female Date of Admission: 01/02/2022  Date of Discharge: 01/05/22 Admitting Physician: A Melven Sartorius., MD  Primary Care Provider: Kathreen Devoid, PA-C Consultants: ID, GYN, IR  Indication for Hospitalization: Abdominal pain, fever  Discharge Diagnoses/Problem List:  Abdominal pain Fever of unclear etiology Multiple Intraabdominal abscess Cavitary Lung Lesion  Disposition: Home with [facility]   Discharge Condition: stable  Discharge Exam:  General: Alert, well appearing, NAD HEENT: Atraumatic, MMM, No sclera icterus CV: RRR, no murmurs, normal S1/S2 Pulm: CTAB, good WOB on RA, no crackles or wheezing Abd: Soft, no distension, Mid lower and RLQ abdominal tenderness Ext: No BLE edema, +2 Pedal and radial pulse.   Brief Hospital Course:  Crystal Mora is a 46 y.o. female who was admitted to the [facility] Teaching Service at [facility] for abdominal pain and fever and found to multiple intra-abdominal abscess. Hospital course is outlined below by system.   Abdominal tenderness w/ fever likely 2/2 multiple intra-abdominal abscesses Patient returned to our service after leaving [facility] 5/18.  Patient reported continued abdominal pain and fever after leaving.  Repeat CT on readmission showed area of subcapsular fluid collection in the inferior left hepatic lobe concerning for abscess and multiple fluid collection in the right side of the pelvis which is concerning for TOA/possible PID and area of fluid collection in the perirectal space concerning for abscess. GC/ Chlamydia screen was negative and blood culture showed no growth for >3 days. Due to concerns of opportunistic infection in the setting of immune suppression from longstanding Humira use and steroid use, her Humira, steroid and Xolair were held. The  abscess was drained and fluid was sent in for cytology which is still pending at discharge. Patient was started on broad antibiotics of IV ceftriaxone and metronidazole (5/24). She remained afebrile on antibiotics with good pain control. Discharged with a PICC line to continue IV ceftriaxone for 4 weeks and PO Flagyl 500 mg BID for 4 weeks. She has close follow up appointment with ID.    RLL cavitary lung lesion CT showed 14/7 mm pleura based cavitary lesion. Recent Quant gold test was negative. Pulmonology recommend follow up CT in 2-3 weeks.   Significant events: 5/24 CT guided IR drainage of hepatic abscess  Discharge recommendations:  Needs repeat CT chest to follow up lung nodule/abscess in about 3 weeks after discharge.  Discharged with PICC line for 4 weeks of IV CTX 2g daily. To be managed by home health nurse Patient is to continue PO Metronidazole 546m BID for 4 weeks Abccess fluid culture was pending by discharge, will follow up with culture. We held her Humira, Steroid and Xolair due to concerns she could be immunocompromised and susceptible to opportunistic infection given multiple intraabdominal abscess.    Significant Procedures:  CT guided IR hepatic abscess drainage   Significant Labs and Imaging:  Recent Labs  Lab 01/02/22 2056 01/04/22 0409 01/05/22 0142  WBC 10.9* 8.0 8.3  HGB 10.9* 10.9* 11.2*  HCT 33.5* 32.7* 34.5*  PLT 484* 454* 451*   Recent Labs  Lab 01/02/22 1329 01/02/22 2056 01/04/22 0409 01/05/22 0142  NA 137  --  137 135  K 3.6  --  3.7 3.9  CL 101  --  106 104  CO2 29  --  27 26  GLUCOSE 90  --  97 93  BUN 7  --  <  5* <5*  CREATININE 0.55 0.52 0.58 0.63  CALCIUM 7.9*  --  8.0* 8.2*  MG 2.1  --   --   --   ALKPHOS 105  --   --   --   AST 20  --   --   --   ALT 19  --   --   --   ALBUMIN 2.3*  --   --   --       Results/Tests Pending at Time of Discharge: Abscess fluid culture  Discharge Medications:  Allergies as of 01/05/2022        Reactions   Biaxin [clarithromycin]    Morphine And Related Hives   Says it is okay with benadryl administration   Percocet [oxycodone-acetaminophen] Hives, Itching   Shrimp [shellfish Allergy]    Citalopram Anxiety   aggression   Topiramate Rash        Medication List     STOP taking these medications    amLODipine 2.5 MG tablet Commonly known as: NORVASC   celecoxib 100 MG capsule Commonly known as: CELEBREX   cephALEXin 500 MG capsule Commonly known as: KEFLEX   estradiol 0.05 mg/24hr patch Commonly known as: CLIMARA - Dosed in mg/24 hr   Humira Pen 80 MG/0.8ML Pnkt Generic drug: Adalimumab   hydroxychloroquine 200 MG tablet Commonly known as: PLAQUENIL   lidocaine 5 % Commonly known as: Lidoderm   linezolid 600 MG tablet Commonly known as: ZYVOX   losartan 100 MG tablet Commonly known as: COZAAR   omalizumab 150 MG/ML prefilled syringe Commonly known as: XOLAIR   progesterone 100 MG capsule Commonly known as: PROMETRIUM       TAKE these medications    cefTRIAXone  IVPB Commonly known as: ROCEPHIN Inject 2 g into the vein daily for 28 days. Indication:  Intra-abdominal abscesses  First Dose: Yes Last Day of Therapy:  02/02/22 Labs - Once weekly:  CBC/D and BMP, Labs - Every other week:  ESR and CRP Method of administration: IV Push Method of administration may be changed at the discretion of home infusion pharmacist based upon assessment of the patient and/or caregiver's ability to self-administer the medication ordered.   cetirizine 10 MG tablet Commonly known as: ZYRTEC Take 10 mg by mouth daily.   diazepam 5 MG tablet Commonly known as: VALIUM Take 5 mg by mouth in the morning, at noon, and at bedtime.   EPINEPHrine 0.3 mg/0.3 mL Soaj injection Commonly known as: EpiPen 2-Pak Inject 0.3 mLs (0.3 mg total) into the muscle once as needed for up to 1 dose (for severe allergic reaction). CAll 911 immediately if you have to use this  medicine   famotidine 20 MG tablet Commonly known as: Pepcid Take 1 tablet (20 mg total) by mouth 2 (two) times daily. What changed:  when to take this reasons to take this   gabapentin 300 MG capsule Commonly known as: NEURONTIN Take 300 mg by mouth daily as needed (For feet pain).   HYDROcodone-acetaminophen 5-325 MG tablet Commonly known as: NORCO/VICODIN Take 1-2 tablets by mouth every 4 (four) hours as needed for up to 7 days for moderate pain.   hydrOXYzine 25 MG tablet Commonly known as: ATARAX Take 1 every 4-6 hours as needed for itch or hives. (Caution: May cause drowsiness) What changed:  how much to take how to take this when to take this reasons to take this additional instructions   methocarbamol 500 MG tablet Commonly known as: ROBAXIN Take 1 tablet (  500 mg total) by mouth 2 (two) times daily. What changed:  when to take this reasons to take this   metroNIDAZOLE 500 MG tablet Commonly known as: FLAGYL Take 1 tablet (500 mg total) by mouth every 12 (twelve) hours for 28 days.   Normal Saline Flush 0.9 % Soln Place 5 mLs into feeding tube daily for 30 doses.   pantoprazole 20 MG tablet Commonly known as: PROTONIX Take 1 tablet (20 mg total) by mouth daily.   Restasis 0.05 % ophthalmic emulsion Generic drug: cycloSPORINE Place 1 drop into both eyes daily as needed for dry eyes.   rosuvastatin 5 MG tablet Commonly known as: CRESTOR Take 5 mg by mouth daily.   sucralfate 1 GM/10ML suspension Commonly known as: CARAFATE Take 10 mLs (1 g total) by mouth 4 (four) times daily -  with meals and at bedtime.   VITAMIN D PO Take 1 tablet by mouth daily.               Discharge Care Instructions  (From admission, onward)           Start     Ordered   01/05/22 0000  Change dressing on IV access line weekly and PRN  (Home infusion instructions - [facility] )        01/05/22 0925            Discharge Instructions: Please  refer to Patient Instructions section of EMR for full details.  Patient was counseled important signs and symptoms that should prompt return to medical care, changes in medications, dietary instructions, activity restrictions, and follow up appointments.   Follow-Up Appointments:  Follow-up Information     Kathreen Devoid, PA-C Follow up.   Specialty: Internal Medicine Contact information: [address] [place] [address] [phone]         [facility] Follow up.   Why: Antibiotic therapy will be provided by [facility] Contact information: [place] Follow up.   Why: your home health RN will be provided by [facility], start of care 01/08/2022 Contact information: [place] Follow up in 2 week(s).   Why: Please folllow up in our [facility] drain clinic for evalaution in 2 weeks. Contact information: [place] [address] [phone]         Mignon Pine, DO. Go on 01/19/2022.   Specialties: Infectious Diseases, Internal Medicine Why: go to appointment at [facility] information: [place] [address] [phone]                 Alen Bleacher, MD 01/05/2022, 8:28 PM PGY-1, [place]

## 2022-01-05 NOTE — Progress Notes (Signed)
Referring Physician(s): Dr. Fayrene Helper   Supervising Physician: Mir, Sharen Heck  Patient Status:  [facility] - In-pt  Chief Complaint:  Liver abscess s/p drain placement in IR 01/03/22   Subjective:  Pt sitting up in recliner eating fruit. She denies pain at site, N/V. She states she is feeling better. Pt adds she is being d/c home today.   Allergies: Biaxin [clarithromycin], Morphine and related, Percocet [oxycodone-acetaminophen], Shrimp [shellfish allergy], Citalopram, and Topiramate  Medications: Prior to Admission medications   Medication Sig Start Date End Date Taking? Authorizing Provider  amLODipine (NORVASC) 2.5 MG tablet Take 2.5 mg by mouth daily. 12/11/21  Yes [provider]  cefTRIAXone (ROCEPHIN) IVPB Inject 2 g into the vein daily for 28 days. Indication:  Intra-abdominal abscesses  First Dose: Yes Last Day of Therapy:  02/02/22 Labs - Once weekly:  CBC/D and BMP, Labs - Every other week:  ESR and CRP Method of administration: IV Push Method of administration may be changed at the discretion of home infusion pharmacist based upon assessment of the patient and/or caregiver's ability to self-administer the medication ordered. 01/05/22 02/02/22 Yes Leeanne Rio, MD  celecoxib (CELEBREX) 100 MG capsule Take 100 mg by mouth 2 (two) times daily. 01/02/22  Yes [provider]  cephALEXin (KEFLEX) 500 MG capsule Take 1 capsule (500 mg total) by mouth 2 (two) times daily for 10 days. 12/28/21 01/07/22 Yes Ezequiel Essex, MD  cetirizine (ZYRTEC) 10 MG tablet Take 10 mg by mouth daily.   Yes [provider]  diazepam (VALIUM) 5 MG tablet Take 5 mg by mouth in the morning, at noon, and at bedtime.   Yes [provider]  EPINEPHrine (EPIPEN 2-PAK) 0.3 mg/0.3 mL IJ SOAJ injection Inject 0.3 mLs (0.3 mg total) into the muscle once as needed for up to 1 dose (for severe allergic reaction). CAll 911 immediately if you have to use this medicine  09/24/18  Yes Baird Cancer, Vilinda Blanks, PA-C  estradiol (CLIMARA - DOSED IN MG/24 HR) 0.05 mg/24hr patch Place 0.05 mg onto the skin once a week. 11/14/21  Yes [provider]  famotidine (PEPCID) 20 MG tablet Take 1 tablet (20 mg total) by mouth 2 (two) times daily. Patient taking differently: Take 20 mg by mouth daily as needed for heartburn. 02/27/20  Yes Jacqulyn Cane, MD  gabapentin (NEURONTIN) 300 MG capsule Take 300 mg by mouth daily as needed (For feet pain). 01/16/21  Yes [provider]  HUMIRA PEN 80 MG/0.8ML PNKT Inject 80 mg into the skin every 14 (fourteen) days. 12/04/21  Yes [provider]  hydroxychloroquine (PLAQUENIL) 200 MG tablet Take 200 mg by mouth daily. 12/05/21  Yes [provider]  hydrOXYzine (ATARAX/VISTARIL) 25 MG tablet Take 1 every 4-6 hours as needed for itch or hives. (Caution: May cause drowsiness) Patient taking differently: Take 25 mg by mouth every 6 (six) hours as needed for itching. 02/27/20  Yes Jacqulyn Cane, MD  linezolid (ZYVOX) 600 MG tablet Take 1 tablet (600 mg total) by mouth 2 (two) times daily for 10 days. 12/28/21 01/07/22 Yes Ezequiel Essex, MD  losartan (COZAAR) 100 MG tablet Take 1 tablet by mouth daily. 11/07/21  Yes [provider]  methocarbamol (ROBAXIN) 500 MG tablet Take 1 tablet (500 mg total) by mouth 2 (two) times daily. Patient taking differently: Take 500 mg by mouth every 6 (six) hours as needed for muscle spasms. 12/16/21  Yes Blue, Soijett A, PA-C  omalizumab Arvid Right) 150 MG/ML prefilled  syringe Inject 150 mg into the skin every 14 (fourteen) days.   Yes [provider]  pantoprazole (PROTONIX) 20 MG tablet Take 1 tablet (20 mg total) by mouth daily. 12/28/21  Yes Ezequiel Essex, MD  progesterone (PROMETRIUM) 100 MG capsule Take 100 mg by mouth daily. 02/09/21  Yes [provider]  RESTASIS 0.05 % ophthalmic emulsion Place 1 drop into both eyes daily as needed for dry eyes. 12/20/21  Yes  [provider]  rosuvastatin (CRESTOR) 5 MG tablet Take 5 mg by mouth daily. 10/13/21  Yes [provider]  VITAMIN D PO Take 1 tablet by mouth daily.   Yes [provider]  lidocaine (LIDODERM) 5 % Place 1 patch onto the skin daily. Remove & Discard patch within 12 hours or as directed by MD Patient not taking: Reported on 01/02/2022 12/16/21   Blue, Soijett A, PA-C  sodium chloride flush (NS) 0.9 % SOLN Place 5 mLs into feeding tube daily for 30 doses. 01/05/22 02/04/22 Yes Tyson Alias, NP  sucralfate (CARAFATE) 1 GM/10ML suspension Take 10 mLs (1 g total) by mouth 4 (four) times daily -  with meals and at bedtime. Patient not taking: Reported on 01/02/2022 12/28/21   Ezequiel Essex, MD     Vital Signs: BP 127/77 (BP Location: Right Arm)   Pulse 97   Temp 98 F (36.7 C)   Resp 18   Wt 173 lb 8 oz (78.7 kg)   SpO2 100%   BMI 28.00 kg/m   Physical Exam Vitals reviewed.  Constitutional:      General: She is not in acute distress.    Appearance: She is not ill-appearing.  HENT:     Head: Normocephalic and atraumatic.  Eyes:     Extraocular Movements: Extraocular movements intact.     Pupils: Pupils are equal, round, and reactive to light.  Pulmonary:     Effort: Pulmonary effort is normal. No respiratory distress.  Abdominal:     Comments: RUQ drain unremarkable with sutures/statlock in place. Drain flushes/aspirates easily.  Dressing C/D/I. ~ 10 ml serosanguineous OP in JP  Musculoskeletal:     Right lower leg: No edema.     Left lower leg: No edema.  Skin:    General: Skin is warm and dry.  Neurological:     Mental Status: She is alert and oriented to person, place, and time.  Psychiatric:        Mood and Affect: Mood normal.        Behavior: Behavior normal.        Thought Content: Thought content normal.        Judgment: Judgment normal.    Imaging: CT CHEST ABDOMEN PELVIS W CONTRAST  Result Date: 01/02/2022 CLINICAL DATA:  Shortness of  breath, gastric abdominal pain. EXAM: CT CHEST, ABDOMEN, AND PELVIS WITH CONTRAST TECHNIQUE: Multidetector CT imaging of the chest, abdomen and pelvis was performed following the standard protocol during bolus administration of intravenous contrast. RADIATION DOSE REDUCTION: This exam was performed according to the departmental dose-optimization program which includes automated exposure control, adjustment of the mA and/or kV according to patient size and/or use of iterative reconstruction technique. CONTRAST:  156m OMNIPAQUE IOHEXOL 300 MG/ML  SOLN COMPARISON:  Dec 25, 2021. FINDINGS: CT CHEST FINDINGS Cardiovascular: No significant vascular findings. Normal heart size. No pericardial effusion. Mediastinum/Nodes: Status post left thyroidectomy. Esophagus is unremarkable. No adenopathy is noted. Lungs/Pleura: No pneumothorax or pleural effusion is noted. Mild left posterior basilar subsegmental atelectasis  is noted. Mild right middle lobe atelectasis is noted. 14 x 7 mm pleural based density is noted posteriorly in right lower lobe with cavitation, concerning for possible cavitating pneumonia or neoplasm. Musculoskeletal: No chest wall mass or suspicious bone lesions identified. CT ABDOMEN PELVIS FINDINGS Hepatobiliary: No gallstones or biliary dilatation is noted. 6.6 x 4.9 cm subcapsular fluid collection is seen along the inferior aspect of the left hepatic lobe which is significantly increased since prior exam and concerning for possible abscess. Pancreas: Pancreatic body is mildly thickened suggesting acute pancreatitis, with probable small pseudo cyst that measures 8 mm currently, which is decreased compared to prior exam. No ductal dilatation is noted. Spleen: Normal in size without focal abnormality. Adrenals/Urinary Tract: Adrenal glands are unremarkable. Kidneys are normal, without renal calculi, focal lesion, or hydronephrosis. Bladder is unremarkable. Stomach/Bowel: There is wall and fold thickening  involving the distal stomach concerning for peptic ulcer disease. No abnormal large or small bowel dilatation is noted. The appendix is unremarkable. Vascular/Lymphatic: No definite vascular abnormality is noted. Mildly enlarged periaortic lymph nodes are noted which most likely are inflammatory in etiology. Reproductive: Uterus is unremarkable. Left adnexal region is unremarkable. There is now noted multiple fluid collections in the right adnexal region, the largest measuring 4.4 x 3.9 cm. This is concerning for possible tubo-ovarian abscess. There is also noted 5.8 x 3.8 cm fluid collection with enhancing margins in the pre rectal space of the pelvis concerning for abscess. Other: No definite hernia is noted. Musculoskeletal: No acute or significant osseous findings. IMPRESSION: 14 x 7 mm pleural based density is noted posteriorly in right lower lobe with cavitation, concerning for possible cavitating pneumonia or necrotic neoplasm. Follow-up CT scan in 2-3 weeks is recommended to ensure resolution or stability. Significantly increased 6.6 x 4.9 cm subcapsular fluid collection along inferior aspect of left hepatic lobe concerning for abscess. There is adjacent wall and fold thickening of the distal stomach concerning for peptic ulcer disease or other inflammation. Also noted are multiple fluid collections in the right side of the pelvis, the largest measuring 4.4 by 3.9 cm, concerning for tubo-ovarian abscess and possible pelvic inflammatory disease. Also noted is 5.8 x 3.8 cm fluid collection with enhancing wall margins in the pre rectal space concerning for abscess. Mild thickening of pancreatic body is noted suggesting acute pancreatitis, with probable small pseudo cyst which is decreased compared to prior exam. Electronically Signed   By: Marijo Conception M.D.   On: 01/02/2022 15:30   DG Chest Portable 1 View  Result Date: 01/02/2022 CLINICAL DATA:  RLL lesion; of breath abdominal pain EXAM: PORTABLE CHEST  1 VIEW COMPARISON:  CT dated Dec 22, 2021; radiograph dated Dec 25, 2021 FINDINGS: The cardiomediastinal silhouette is unchanged in contour.Unchanged mild elevation of the RIGHT hemidiaphragm. No pleural effusion. No pneumothorax. No acute pleuroparenchymal abnormality. Visualized abdomen is unremarkable. Previously described cavitary lesion is not visualized radiographically. IMPRESSION: Previously described cavitary lesion is not visualized radiographically. Electronically Signed   By: Valentino Saxon M.D.   On: 01/02/2022 13:44   CT IMAGE GUIDED DRAINAGE BY PERCUTANEOUS CATHETER  Result Date: 01/03/2022 CLINICAL DATA:  LEFT LOBE HEPATIC ABSCESS. EXAM: CT GUIDED CATHETER DRAINAGE OF HEPATIC ABSCESS ANESTHESIA/SEDATION: Moderate (conscious) sedation was employed during this procedure. A total of Versed 1.5 mg and Fentanyl 75 mcg was administered intravenously by radiology nursing. Moderate Sedation Time: 17 minutes. The patient's level of consciousness and vital signs were monitored continuously by radiology nursing throughout the procedure under my  direct supervision. PROCEDURE: The procedure, risks, benefits, and alternatives were explained to the patient. Questions regarding the procedure were encouraged and answered. The patient understands and consents to the procedure. A time out was performed prior to initiating the procedure. CT was performed in a supine position through the upper to mid abdomen. The anterior abdominal wall was prepped with chlorhexidine in a sterile fashion, and a sterile drape was applied covering the operative field. A sterile gown and sterile gloves were used for the procedure. Local anesthesia was provided with 1% Lidocaine. A 17 gauge trocar needle was advanced into the left lobe of the liver under CT guidance. After confirming needle tip position, a small amount of fluid was aspirated through the needle. A guidewire was advanced through the needle and the needle removed. The  percutaneous tract was dilated over the wire to 10 [place] and a 10 French percutaneous drainage catheter advanced over the wire. A fluid sample was aspirated from the drain and sent for culture analysis. Catheter position was confirmed by CT. The catheter was attached to a suction bulb and secured at the skin with a Prolene retention suture. A sterile dressing was applied at the catheter exit site. RADIATION DOSE REDUCTION: This exam was performed according to the departmental dose-optimization program which includes automated exposure control, adjustment of the mA and/or kV according to patient size and/or use of iterative reconstruction technique. COMPLICATIONS: None FINDINGS: After advancing a needle into the left hepatic abscess, there was return of turbid, yellow fluid. After drainage catheter placement, a larger sample was withdrawn and sent for culture analysis. There is good return of turbid fluid from the drainage catheter after placement. IMPRESSION: CT-guided percutaneous catheter drainage of left lobe hepatic abscess. A 10 French percutaneous drain was placed. There was return of turbid, yellow fluid. A sample was sent for culture analysis. Electronically Signed   By: Aletta Edouard M.D.   On: 01/03/2022 15:51   [place] EKG SITE RITE  Result Date: 01/05/2022 If Site Rite image not attached, placement could not be confirmed due to current cardiac rhythm.   Labs:  CBC: Recent Labs    01/02/22 1329 01/02/22 2056 01/04/22 0409 01/05/22 0142  WBC 11.2* 10.9* 8.0 8.3  HGB 11.5* 10.9* 10.9* 11.2*  HCT 34.2* 33.5* 32.7* 34.5*  PLT 463* 484* 454* 451*    COAGS: No results for input(s): INR, APTT in the last 8760 hours.  BMP: Recent Labs    12/28/21 0109 01/02/22 1329 01/02/22 2056 01/04/22 0409 01/05/22 0142  NA 138 137  --  137 135  K 3.6 3.6  --  3.7 3.9  CL 105 101  --  106 104  CO2 24 29  --  27 26  GLUCOSE 83 90  --  97 93  BUN <5* 7  --  <5* <5*  CALCIUM 8.1* 7.9*  --  8.0*  8.2*  CREATININE 0.64 0.55 0.52 0.58 0.63  GFRNONAA >60 >60 >60 >60 >60    LIVER FUNCTION TESTS: Recent Labs    12/22/21 1020 12/26/21 0558 12/27/21 0101 01/02/22 1329  BILITOT 0.6 1.0 0.5 0.7  AST 12* 27 45* 20  ALT _0 ALKPHOS 68 78 73 105  PROT 6.8 6.0* 5.6* 6.7  ALBUMIN 3.1* 2.2* 2.1* 2.3*    Assessment and Plan:   Liver abscess s/p drain placement in IR 01/03/22   Subjective:  Pt sitting up in recliner eating fruit. She denies pain at site, N/V. She states she  is feeling better. Pt adds she is being d/c home today.   RUQ drain unremarkable with sutures/statlock in place. Drain flushes/aspirates easily.  Dressing C/D/I. ~ 10 ml serosanguineous OP in JP with 20 ml OP documented over past 24 hours.   Labs WNL VSS  Continue TID flushes with 5 cc NS. Record output Q shift. Dressing changes QD or PRN if soiled.  Call IR APP or on call IR MD if difficulty flushing or sudden change in drain output.  Repeat imaging/possible drain injection once output < 10 mL/QD (excluding flush material.)   Discharge planning: Orders placed for patient to follow up with IR clinic 10-14 days post d/c for repeat imaging/possible drain injection. IR scheduler will contact patient with date/time of appointment.   Patient will need to flush drain QD with 5 cc NS, record output QD, dressing changes every 2-3 days or earlier if soiled.  Prescription for saline flushes sent to [facility].    Electronically Signed: Tyson Alias, NP 01/05/2022, 3:03 PM   I spent a total of 15 Minutes at the the patient's bedside AND on the patient's hospital floor or unit, greater than 50% of which was counseling/coordinating care for liver abscess drain.

## 2022-01-05 NOTE — TOC Transition Note (Signed)
Transition of Care [facility]) - CM/SW Discharge Note   Patient Details  Name: Crystal Mora MRN: [id number] Date of Birth: 1976/06/13  Transition of Care [facility]) CM/SW Contact:  Sharin Mons, RN Phone Number: 01/05/2022, 1:09 PM   Clinical Narrative:    Patient will DC to: home  Anticipated DC date: 01/05/2022 Family notified: yes Transport by: car  Readmitted with multiple intra-abdominal abscesses.      - s/p  IR drain placement into liver abscess, 5/24  From home with family. PTA independent with ADL's, no DME usage.  Per MD patient ready for DC today once PICC line placed. Pt will require 14 days of  IV ABX  therapy. RN, patient, patient's family,home health agencies aware of DC. [place], start of care will be 01/08/2022. Home infusion teaching completed by Pam with [facility].  Pt without Rx med concerns.  Post hospital f/u  noted on AVS.  Family to provide transportation to home .  RNCM will sign off for now as intervention is no longer needed. Please consult [place] again if new needs arise.    Final next level of care: Home w Home Health Services Barriers to Discharge: No Barriers Identified   Patient Goals and CMS Choice     Choice offered to / list presented to : Patient  Discharge Placement      Discharge Plan and Services                DME Arranged: Other see comment (IV ABX therapy/ [facility]) DME Agency: Other - Comment Date DME Agency Contacted: 01/05/22 Time DME Agency Contacted: 1205 Representative spoke with at DME Agency: Pam HH Arranged: RN [place] Agency: Other - See comment ([place]) Date [place]: 01/05/22 Time [place]: 1212 Representative spoke with at [place]: [place] ([place]) Interventions     Readmission Risk Interventions    01/03/2022    2:44 PM  Readmission Risk Prevention Plan  Transportation Screening Complete  PCP or  Specialist Appt within 5-7 Days Complete

## 2022-01-05 NOTE — Discharge Instructions (Addendum)
Dear Crystal Mora,   Thank you for letting us participate in your care! In this section, you will find a brief hospital admission summary of why you were admitted to the hospital, what happened during your admission, your diagnosis/diagnoses, and recommended follow up.   Your were admitted because you were having significant abdominal pain with fever and found to have multiple abdominal abscess of unclear cause, it could be a fungal infection. The abscess was drained and sent to lab for culture. You were also treated with antibiotic which you should continue after discharge.     You are to continue oral Flagyl for 14 days and you have a temporary line to receive IV antibiotics for 14 days. A home health nurse will visit you daily to administer the IV antibiotics.   Pleases follow up with your PCP within one week of discharge.   POST-HOSPITAL & CARE INSTRUCTIONS Take your Flagyl (Metronidazole) 500 mg twice daily for 4 weeks Home health nurse will come daily to administer your IV ceftriaxone 2g for 4 weeks Inform your PCP about getting a chest CT imagining in 2-3 weeks Do not take your Humira, steroid or Xolair until you have consulted and been cleared to restart by your PCP  Please let PCP/Specialists know of any changes in medications that were made.  Please see medications section of this packet for any medication changes.   DOCTOR'S APPOINTMENTS & FOLLOW UP Future Appointments  Date Time Provider [place]  01/19/2022  4:00 PM Mignon Pine, DO [facility]  02/02/2022  3:00 PM Milus Banister, MD [facility]     Thank you for choosing [facility]! Take care and be well!  [facility]  [place], [place] [address] [phone]

## 2022-01-05 NOTE — Care Management Important Message (Signed)
Important Message  Patient Details  Name: Crystal Mora MRN: [id number] Date of Birth: 1975-10-23   Medicare Important Message Given:  Yes     Taydon Nasworthy Montine Circle 01/05/2022, 3:40 PM

## 2022-01-05 NOTE — Progress Notes (Signed)
FPTS Brief Progress Note  S:Patient sleeping comfortably   O: BP 139/87 (BP Location: Right Arm)   Pulse 90   Temp 98.7 F (37.1 C) (Oral)   Resp 18   SpO2 99%     A/P:  Continue current management  - Orders reviewed. Labs for AM ordered, which was adjusted as needed.   Shary Key, DO 01/05/2022, 1:42 AM PGY-2, [facility] Night Resident  Please page [phone] with questions.

## 2022-01-05 NOTE — Progress Notes (Signed)
PHARMACY CONSULT NOTE FOR:  OUTPATIENT  PARENTERAL ANTIBIOTIC THERAPY (OPAT)  Indication: Intra-abdominal abscesses Regimen: Ceftriaxone 2 gm IV Q 24 hours + Metronidazole 500 mg BID PO  End date: 02/02/22  IV antibiotic discharge orders are pended. To discharging provider:  please sign these orders via discharge navigator,  Select New Orders & click on the button choice - Manage This Unsigned Work.     Thank you for allowing pharmacy to be a part of this patient's care.  Jimmy Footman, PharmD, BCPS, BCIDP Infectious Diseases Clinical Pharmacist Phone: [phone] 01/05/2022, 9:23 AM

## 2022-01-05 NOTE — Progress Notes (Signed)
Peripherally Inserted Central Catheter Placement  The IV Nurse has discussed with the patient and/or persons authorized to consent for the patient, the purpose of this procedure and the potential benefits and risks involved with this procedure.  The benefits include less needle sticks, lab draws from the catheter, and the patient may be discharged home with the catheter. Risks include, but not limited to, infection, bleeding, blood clot (thrombus formation), and puncture of an artery; nerve damage and irregular heartbeat and possibility to perform a PICC exchange if needed/ordered by physician.  Alternatives to this procedure were also discussed.  Bard Power PICC patient education guide, fact sheet on infection prevention and patient information card has been provided to patient /or left at bedside.    PICC Placement Documentation  PICC Single Lumen 92/42/68 Right Basilic 36 cm (Active)  Indication for Insertion or Continuance of Line Home intravenous therapies (PICC only) 01/05/22 1500  Site Assessment Clean, Dry, Intact 01/05/22 1500  Line Status Flushed;Blood return noted;Saline locked 01/05/22 1500  Dressing Type Transparent;Securing device 01/05/22 1500  Dressing Status Clean, Dry, Intact;Antimicrobial disc in place 01/05/22 1500  Dressing Change Due 01/12/22 01/05/22 1500       Jule Economy Horton 01/05/2022, 3:24 PM

## 2022-01-05 NOTE — Plan of Care (Signed)

## 2022-01-07 LAB — CULTURE, BLOOD (ROUTINE X 2)
Culture: NO GROWTH
Culture: NO GROWTH

## 2022-01-08 LAB — AEROBIC/ANAEROBIC CULTURE W GRAM STAIN (SURGICAL/DEEP WOUND): Culture: NO GROWTH

## 2022-01-09 ENCOUNTER — Other Ambulatory Visit: Payer: Self-pay | Admitting: Internal Medicine

## 2022-01-09 DIAGNOSIS — L0291 Cutaneous abscess, unspecified: Secondary | ICD-10-CM

## 2022-01-11 LAB — ACID FAST SMEAR (AFB, MYCOBACTERIA): Acid Fast Smear: NEGATIVE

## 2022-01-17 ENCOUNTER — Other Ambulatory Visit (HOSPITAL_COMMUNITY): Payer: Self-pay

## 2022-01-18 ENCOUNTER — Ambulatory Visit
Admission: RE | Admit: 2022-01-18 | Discharge: 2022-01-18 | Disposition: A | Discharge status: Home / Self Care | Payer: Medicare Other | Source: Ambulatory Visit | Attending: Internal Medicine | Admitting: Internal Medicine

## 2022-01-18 ENCOUNTER — Encounter: Payer: Self-pay | Admitting: *Deleted

## 2022-01-18 DIAGNOSIS — L0291 Cutaneous abscess, unspecified: Secondary | ICD-10-CM

## 2022-01-18 HISTORY — PX: IR RADIOLOGIST EVAL & MGMT: IMG5224

## 2022-01-18 MED ORDER — IOPAMIDOL (ISOVUE-300) INJECTION 61%
100.0000 mL | Freq: Once | INTRAVENOUS | Status: AC | PRN
  Administered 2022-01-18: 100 mL via INTRAVENOUS

## 2022-01-18 NOTE — Progress Notes (Signed)
Referring Physician(s): Dr Darnelle Maffucci  Chief Complaint: The patient is seen in follow up today s/p left hepatic abscess drain placed in IR 01/03/22  History of present illness: [facility] ED 5/23 with complaints of fevers, chills, nightsweats, malaise, abdominal pain and diarrhea. She had been admitted 5/12-5/18 for the same but left AMA. CT abd/pelvis w/contrast on 5/12 showed: left hepatic abscess  Drain placed in IR 01/03/22- Dr Kathlene Cote  Scheduled today for re evaluation and CT.  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 Authorizing Provider  cefTRIAXone (ROCEPHIN) IVPB Inject 2 g into the vein daily for 28 days. Indication:  Intra-abdominal abscesses  First Dose: Yes Last Day of Therapy:  02/02/22 Labs - Once weekly:  CBC/D and BMP, Labs - Every other week:  ESR and CRP Method of administration: IV Push Method of administration may be changed at the discretion of home infusion pharmacist based upon assessment of the patient and/or  caregiver's ability to self-administer the medication ordered. 01/05/22 02/02/22  Leeanne Rio, MD  cetirizine (ZYRTEC) 10 MG tablet Take 10 mg by mouth daily.    [provider]  diazepam (VALIUM) 5 MG tablet Take 5 mg by mouth in the morning, at noon, and at bedtime.    [provider]  EPINEPHrine (EPIPEN 2-PAK) 0.3 mg/0.3 mL IJ SOAJ injection Inject 0.3 mLs (0.3 mg total) into the muscle once as needed for up to 1 dose (for severe allergic reaction). CAll 911 immediately if you have to use this medicine 09/24/18   Larene Pickett, PA-C  famotidine (PEPCID) 20 MG tablet Take 1 tablet (20 mg total) by mouth 2 (two) times daily. Patient taking differently: Take 20 mg by mouth daily as needed for heartburn. 02/27/20   Jacqulyn Cane, MD  gabapentin (NEURONTIN) 300 MG capsule Take 300 mg by mouth daily as needed (For feet pain). 01/16/21   [provider]  hydrOXYzine (ATARAX/VISTARIL) 25 MG tablet Take 1 every 4-6 hours as needed for itch or hives. (Caution: May cause drowsiness) Patient taking differently: Take 25 mg by mouth every 6 (six) hours as needed for itching. 02/27/20   Jacqulyn Cane, MD  methocarbamol (ROBAXIN) 500 MG tablet Take 1 tablet (500 mg total) by mouth 2 (two) times daily. Patient taking differently: Take 500 mg by mouth every 6 (six) hours as needed for muscle spasms. 12/16/21   Blue, Soijett A, PA-C  metroNIDAZOLE (  FLAGYL) 500 MG tablet Take 1 tablet (500 mg total) by mouth every 12 (twelve) hours for 28 days. 01/05/22 02/02/22  Lattie Haw, MD  pantoprazole (PROTONIX) 20 MG tablet Take 1 tablet (20 mg total) by mouth daily. 12/28/21   Ezequiel Essex, MD  RESTASIS 0.05 % ophthalmic emulsion Place 1 drop into both eyes daily as needed for dry eyes. 12/20/21   [provider]  rosuvastatin (CRESTOR) 5 MG tablet Take 5 mg by mouth daily. 10/13/21   [provider]  Sodium Chloride Flush (NORMAL SALINE FLUSH) 0.9 % SOLN Place 5 mLs into  feeding tube daily for 30 doses. 01/05/22 02/04/22  Tyson Alias, NP  sucralfate (CARAFATE) 1 GM/10ML suspension Take 10 mLs (1 g total) by mouth 4 (four) times daily -  with meals and at bedtime. Patient not taking: Reported on 01/02/2022 12/28/21   Ezequiel Essex, MD  VITAMIN D PO Take 1 tablet by mouth daily.    [provider]     Family History  Problem Relation Age of Onset   Depression Mother    Mental illness Mother    Asthma Mother    COPD Mother    Diabetes Mother    Mental illness Brother    Mental illness Daughter    Diabetes Maternal Aunt    Mental illness Maternal Aunt    Depression Maternal Aunt    Diabetes Maternal Grandmother     Social History   Socioeconomic History   Marital status: Divorced    Spouse name: Not on file   Number of children: Not on file   Years of education: Not on file   Highest education level: Not on file  Occupational History   Not on file  Tobacco Use   Smoking status: Former    Packs/day: 0.50    Types: Cigars, Cigarettes   Smokeless tobacco: Never   Tobacco comments:    1 cigar daily  Vaping Use   Vaping Use: Every day   Substances: Flavoring  Substance and Sexual Activity   Alcohol use: Yes    Comment: Rare   Drug use: No   Sexual activity: Yes    Birth control/protection: Surgical  Other Topics Concern   Not on file  Social History Narrative   Not on file   Social Determinants of Health   Financial Resource Strain: Not on file  Food Insecurity: Not on file  Transportation Needs: Not on file  Physical Activity: Not on file  Stress: Not on file  Social Connections: Not on file     Vital Signs: There were no vitals taken for this visit.  Physical Exam Skin:    General: Skin is warm.     Comments: Site is clean and dry NT no bleeding No sign of infection  CT today shows resolution of abscess per Dr Stephenie Acres removed per order Dressing placed      Imaging: No results  found.  Labs:  CBC: Recent Labs    01/02/22 1329 01/02/22 2056 01/04/22 0409 01/05/22 0142  WBC 11.2* 10.9* 8.0 8.3  HGB 11.5* 10.9* 10.9* 11.2*  HCT 34.2* 33.5* 32.7* 34.5*  PLT 463* 484* 454* 451*    COAGS: No results for input(s): "INR", "APTT" in the last 8760 hours.  BMP: Recent Labs    12/28/21 0109 01/02/22 1329 01/02/22 2056 01/04/22 0409 01/05/22 0142  NA 138 137  --  137 135  K 3.6 3.6  --  3.7 3.9  CL 105 101  --  106 104  CO2 24 29  --  27 26  GLUCOSE 83 90  --  97 93  BUN <5* 7  --  <5* <5*  CALCIUM 8.1* 7.9*  --  8.0* 8.2*  CREATININE 0.64 0.55 0.52 0.58 0.63  GFRNONAA >60 >60 >60 >60 >60    LIVER FUNCTION TESTS: Recent Labs    12/22/21 1020 12/26/21 0558 12/27/21 0101 01/02/22 1329  BILITOT 0.6 1.0 0.5 0.7  AST 12* 27 45* 20  ALT 13 16 28 19   ALKPHOS 68 78 73 105  PROT 6.8 6.0* 5.6* 6.7  ALBUMIN 3.1* 2.2* 2.1* 2.3*    Assessment:  Hepatic abscess CT today showing resolution of abscess Output minimal to none Flushing more difficult Denies fever/chills/pain Still using PICC for IV antibiotics with [facility] Follows with ID-- Dr Gale Journey To keep appt with ID tomorrow  Removed drain today per Dr Earleen Newport order Pt to follow with ID Continue antibx as prescribed She has good understanding of plan  Signed: Lavonia Drafts, PA-C 01/18/2022, 1:31 PM   Please refer to Dr. Earleen Newport attestation of this note for management and plan.

## 2022-01-19 ENCOUNTER — Encounter: Payer: Self-pay | Admitting: Internal Medicine

## 2022-01-19 ENCOUNTER — Ambulatory Visit (INDEPENDENT_AMBULATORY_CARE_PROVIDER_SITE_OTHER): Payer: Medicare Other | Admitting: Internal Medicine

## 2022-01-19 ENCOUNTER — Other Ambulatory Visit: Payer: Self-pay

## 2022-01-19 VITALS — BP 120/84 | HR 100 | Temp 98.2°F | Wt 167.0 lb

## 2022-01-19 DIAGNOSIS — J984 Other disorders of lung: Secondary | ICD-10-CM

## 2022-01-19 DIAGNOSIS — Z452 Encounter for adjustment and management of vascular access device: Secondary | ICD-10-CM | POA: Insufficient documentation

## 2022-01-19 DIAGNOSIS — K651 Peritoneal abscess: Secondary | ICD-10-CM

## 2022-01-19 NOTE — Progress Notes (Signed)
[facility] Mor[facility] for Infectious Disease  CHIEF COMPLAINT:    Follow up for intra-abdominal abscess  SUBJECTIVE:    Crystal Mora is a 46 y.o. female with PMHx as below who presents to the clinic for intra-abdominal abscess.   Patient is here today for hospital follow-up.  She was recently admitted at [facility] from 5/23-5/26/2023 after presenting with abdominal pain, fever, and imaging findings of multiple intra-abdominal abscesses as well as a right lower lobe cavitary lung lesion.  She underwent IR guided drain placement of left hepatic abscess on 5/24.  Cultures from this drain placement were no growth and blood cultures were also negative.  She had a PICC line placed and was discharged home on ceftriaxone 2 g IV daily via PICC line and metronidazole 500 mg twice daily p.o.  The end date for antibiotics is tentatively 02/02/22.    Patient had follow-up with interventional radiology yesterday.  CT showed resolution of abscess and minimal to no output.  Her drain was removed by IR.  Formal radiology read of the rest of her CT abdomen/pelvis is pending at this time.    She also had a follow-up with her primary care physician on 6/6 as well who ordered a follow-up CT scan of her chest given the presence of abnormality in the right lower lobe.  This is scheduled for 6/16.   She is here today for follow up and has no issues with her PICC line.  She is tolerating antibiotics with no fevers, chills, n/v/d.  She is glad to have the drain removed.  She continues to have no respiratory symptoms.   Please see A&P for the details of today's visit and status of the patient's medical problems.   Patient's Medications  New Prescriptions   No medications on file  Previous Medications   CEFTRIAXONE (ROCEPHIN) IVPB    Inject 2 g into the vein daily for 28 days. Indication:  Intra-abdominal abscesses  First Dose: Yes Last Day of Therapy:  02/02/22 Labs - Once weekly:  CBC/D and  BMP, Labs - Every other week:  ESR and CRP Method of administration: IV Push Method of administration may be changed at the discretion of home infusion pharmacist based upon assessment of the patient and/or caregiver's ability to self-administer the medication ordered.   CETIRIZINE (ZYRTEC) 10 MG TABLET    Take 10 mg by mouth daily.   DIAZEPAM (VALIUM) 5 MG TABLET    Take 5 mg by mouth in the morning, at noon, and at bedtime.   EPINEPHRINE (EPIPEN 2-PAK) 0.3 MG/0.3 ML IJ SOAJ INJECTION    Inject 0.3 mLs (0.3 mg total) into the muscle once as needed for up to 1 dose (for severe allergic reaction). CAll 911 immediately if you have to use this medicine   FAMOTIDINE (PEPCID) 20 MG TABLET    Take 1 tablet (20 mg total) by mouth 2 (two) times daily.   GABAPENTIN (NEURONTIN) 300 MG CAPSULE    Take 300 mg by mouth daily as needed (For feet pain).   HYDROXYZINE (ATARAX/VISTARIL) 25 MG TABLET    Take 1 every 4-6 hours as needed for itch or hives. (Caution: May cause drowsiness)   METHOCARBAMOL (ROBAXIN) 500 MG TABLET    Take 1 tablet (500 mg total) by mouth 2 (two) times daily.   METRONIDAZOLE (FLAGYL) 500 MG TABLET    Take 1 tablet (500 mg total) by mouth every 12 (twelve) hours for 28 days.  PANTOPRAZOLE (PROTONIX) 20 MG TABLET    Take 1 tablet (20 mg total) by mouth daily.   RESTASIS 0.05 % OPHTHALMIC EMULSION    Place 1 drop into both eyes daily as needed for dry eyes.   ROSUVASTATIN (CRESTOR) 5 MG TABLET    Take 5 mg by mouth daily.   SODIUM CHLORIDE FLUSH (NORMAL SALINE FLUSH) 0.9 % SOLN    Place 5 mLs into feeding tube daily for 30 doses.   SUCRALFATE (CARAFATE) 1 GM/10ML SUSPENSION    Take 10 mLs (1 g total) by mouth 4 (four) times daily -  with meals and at bedtime.   VITAMIN D PO    Take 1 tablet by mouth daily.  Modified Medications   No medications on file  Discontinued Medications   No medications on file      Past Medical History:  Diagnosis Date   Anxiety    Bipolar 1 disorder ([place])     Chronic urticaria    Depression    Diabetes mellitus without complication ([facility])    Hypertension    states she has not took BP medication for 2 years   Insomnia    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   Thyroid disease     Social History   Tobacco Use   Smoking status: Former    Packs/day: 0.50    Types: Cigars, Cigarettes   Smokeless tobacco: Never   Tobacco comments:    1 cigar daily  Vaping Use   Vaping Use: Every day   Substances: Flavoring  Substance Use Topics   Alcohol use: Yes    Comment: Rare   Drug use: No    Family History  Problem Relation Age of Onset   Depression Mother    Mental illness Mother    Asthma Mother    COPD Mother    Diabetes Mother    Mental illness Brother    Mental illness Daughter    Diabetes Maternal Aunt    Mental illness Maternal Aunt    Depression Maternal Aunt    Diabetes Maternal Grandmother     Allergies  Allergen Reactions   Biaxin [Clarithromycin]    Morphine And Related Hives    Says it is okay with benadryl administration   Percocet [Oxycodone-Acetaminophen] Hives and Itching   Shrimp [Shellfish Allergy]    Citalopram Anxiety    aggression   Topiramate Rash    Review of Systems  All other systems reviewed and are negative.  Except as noted above.   OBJECTIVE:    Vitals:   01/19/22 1556  BP: 120/84  Pulse: 100  Temp: 98.2 F (36.8 C)  TempSrc: Oral  SpO2: 98%  Weight: 167 lb (75.8 kg)   Body mass index is 26.95 kg/m.  Physical Exam Constitutional:      General: She is not in acute distress.    Appearance: Normal appearance.  HENT:     Head: Normocephalic and atraumatic.  Eyes:     Extraocular Movements: Extraocular movements intact.     Conjunctiva/sclera: Conjunctivae normal.  Pulmonary:     Effort: Pulmonary effort is normal. No respiratory distress.  Abdominal:     General: There is no distension.     Palpations: Abdomen is soft.  Musculoskeletal:     Cervical back: Normal range of  motion and neck supple.     Comments: Right UE Picc line in place.   Skin:    General: Skin is warm and dry.  Neurological:  General: No focal deficit present.     Mental Status: She is alert and oriented to person, place, and time.  Psychiatric:        Mood and Affect: Mood normal.        Behavior: Behavior normal.      Labs and Microbiology:    Latest Ref Rng & Units 01/05/2022    1:42 AM 01/04/2022    4:09 AM 01/02/2022    8:56 PM  CBC  WBC 4.0 - 10.5 K/uL 8.3  8.0  10.9   Hemoglobin 12.0 - 15.0 g/dL 11.2  10.9  10.9   Hematocrit 36.0 - 46.0 % 34.5  32.7  33.5   Platelets 150 - 400 K/uL 451  454  484       Latest Ref Rng & Units 01/05/2022    1:42 AM 01/04/2022    4:09 AM 01/02/2022    8:56 PM  CMP  Glucose 70 - 99 mg/dL 93  97    BUN 6 - 20 mg/dL <5  <5    Creatinine 0.44 - 1.00 mg/dL 0.63  0.58  0.52   Sodium 135 - 145 mmol/L 135  137    Potassium 3.5 - 5.1 mmol/L 3.9  3.7    Chloride 98 - 111 mmol/L 104  106    CO2 22 - 32 mmol/L 26  27    Calcium 8.9 - 10.3 mg/dL 8.2  8.0         ASSESSMENT & PLAN:    Intra-abdominal abscess ([facility]) Her hepatic abscess s/p IR drain placement has resolved and the drain was subsequently removed by IR on 01/18/22.  The formal read for the rest of her CT scan is pending to assess the other un-drained intra-abdominal abscesses.  Will continue with current ceftriaxone 2gm IV daily and metronidazole 530m BID PO for now pending the results of her CT.  Based on this will determine whether to continue vs transition to Augmentin.  Cavitary lesion of lung She will have a follow up CT chest next Friday.  Will continue antibiotics pending this result.  PICC (peripherally inserted central catheter) in place   PICC in place and no issues.     ARaynelle Highlandfor Infectious Disease [facility] 01/19/2022, 4:16 PM

## 2022-01-19 NOTE — Assessment & Plan Note (Signed)
She will have a follow up CT chest next Friday.  Will continue antibiotics pending this result.

## 2022-01-19 NOTE — Assessment & Plan Note (Signed)
   PICC in place and no issues.

## 2022-01-19 NOTE — Assessment & Plan Note (Signed)
Her hepatic abscess s/p IR drain placement has resolved and the drain was subsequently removed by IR on 01/18/22.  The formal read for the rest of her CT scan is pending to assess the other un-drained intra-abdominal abscesses.  Will continue with current ceftriaxone 2gm IV daily and metronidazole 500mg  BID PO for now pending the results of her CT.  Based on this will determine whether to continue vs transition to Augmentin.

## 2022-01-23 ENCOUNTER — Telehealth: Payer: Self-pay

## 2022-01-23 ENCOUNTER — Other Ambulatory Visit: Payer: Self-pay

## 2022-01-23 ENCOUNTER — Encounter: Payer: Self-pay | Admitting: Internal Medicine

## 2022-01-23 ENCOUNTER — Ambulatory Visit (INDEPENDENT_AMBULATORY_CARE_PROVIDER_SITE_OTHER): Payer: Medicare Other | Admitting: Internal Medicine

## 2022-01-23 DIAGNOSIS — Z880 Allergy status to penicillin: Secondary | ICD-10-CM

## 2022-01-23 DIAGNOSIS — J984 Other disorders of lung: Secondary | ICD-10-CM

## 2022-01-23 DIAGNOSIS — K651 Peritoneal abscess: Secondary | ICD-10-CM

## 2022-01-23 DIAGNOSIS — L732 Hidradenitis suppurativa: Secondary | ICD-10-CM

## 2022-01-23 DIAGNOSIS — Z452 Encounter for adjustment and management of vascular access device: Secondary | ICD-10-CM | POA: Diagnosis not present

## 2022-01-23 MED ORDER — AMOXICILLIN-POT CLAVULANATE 875-125 MG PO TABS: 1.0000 | ORAL_TABLET | Freq: Two times a day (BID) | ORAL | 0 refills | Status: DC

## 2022-01-23 NOTE — Telephone Encounter (Signed)
Called and spoke with Carolynn Sayers at [facility], and also sent the following verbal order to [facility] via [employer]. Pam verbalized understanding and had no additional questions.  Per verbal order from Dr. Juleen China, please pull patient's PICC as soon as possible. New EOT date is today (01/23/22). Dr. Juleen China confirmed that no dose of IV rocephin is needed today. He has seen the patient today and is sending in a prescription for an oral antibiotic.  Patient to stop both IV ceftriaxone and PO metronidazole. Will start Augmentin 875 mg PO twice daily for 3 more weeks.  Binnie Kand, RN

## 2022-01-23 NOTE — Assessment & Plan Note (Signed)
She has previously been on Humira and would like to avoid this immunosuppressant in the future.  She will discuss with her dermatologist in the future about alternative treatment options.

## 2022-01-23 NOTE — Progress Notes (Signed)
[facility]  CHIEF COMPLAINT:    Follow up for intra-abdominal abscess  SUBJECTIVE:    Crystal Mora is a 46 y.o. female with PMHx as below who presents to the clinic for intra-abdominal abscess.   Patient is here today for planned follow up.  See my note from 01/19/22 for further details of her recent course.  Her CT scan was read after her visit on 6/9 and showed redemonstration of mixed cavitation GGO in the RLL periphery that was similar to decreasing in size.  She remains without any respiratory symptoms.  There was complete resolution of the hepatic abscess s/p IR drainage.  There was interval resolution of prior pancreatic inflammatory changes.  There was near complete resolution of the fluid in the recto uterine space.  She has continued on ceftriaxone and flagyl for now pending the CT scan results.  She has felt well following drain removal with no fevers, chills, abdominal pain.    Please see A&P for the details of today's visit and status of the patient's medical problems.   Patient's Medications  New Prescriptions   AMOXICILLIN-CLAVULANATE (AUGMENTIN) 875-125 MG TABLET    Take 1 tablet by mouth 2 (two) times daily for 21 days.  Previous Medications   CETIRIZINE (ZYRTEC) 10 MG TABLET    Take 10 mg by mouth daily.   DIAZEPAM (VALIUM) 5 MG TABLET    Take 5 mg by mouth in the morning, at noon, and at bedtime.   EPINEPHRINE (EPIPEN 2-PAK) 0.3 MG/0.3 ML IJ SOAJ INJECTION    Inject 0.3 mLs (0.3 mg total) into the muscle once as needed for up to 1 dose (for severe allergic reaction). CAll 911 immediately if you have to use this medicine   FAMOTIDINE (PEPCID) 20 MG TABLET    Take 1 tablet (20 mg total) by mouth 2 (two) times daily.   GABAPENTIN (NEURONTIN) 300 MG CAPSULE    Take 300 mg by mouth daily as needed (For feet pain).   HYDROXYZINE (ATARAX/VISTARIL) 25 MG TABLET    Take 1 every 4-6 hours as needed for itch or hives. (Caution: May cause  drowsiness)   METHOCARBAMOL (ROBAXIN) 500 MG TABLET    Take 1 tablet (500 mg total) by mouth 2 (two) times daily.   PANTOPRAZOLE (PROTONIX) 20 MG TABLET    Take 1 tablet (20 mg total) by mouth daily.   RESTASIS 0.05 % OPHTHALMIC EMULSION    Place 1 drop into both eyes daily as needed for dry eyes.   ROSUVASTATIN (CRESTOR) 5 MG TABLET    Take 5 mg by mouth daily.   SODIUM CHLORIDE FLUSH (NORMAL SALINE FLUSH) 0.9 % SOLN    Place 5 mLs into feeding tube daily for 30 doses.   SUCRALFATE (CARAFATE) 1 GM/10ML SUSPENSION    Take 10 mLs (1 g total) by mouth 4 (four) times daily -  with meals and at bedtime.   VITAMIN D PO    Take 1 tablet by mouth daily.  Modified Medications   No medications on file  Discontinued Medications   CEFTRIAXONE (ROCEPHIN) IVPB    Inject 2 g into the vein daily for 28 days. Indication:  Intra-abdominal abscesses  First Dose: Yes Last Day of Therapy:  02/02/22 Labs - Once weekly:  CBC/D and BMP, Labs - Every other week:  ESR and CRP Method of administration: IV Push Method of administration may be changed at the discretion of home infusion pharmacist  based upon assessment of the patient and/or caregiver's ability to self-administer the medication ordered.   METRONIDAZOLE (FLAGYL) 500 MG TABLET    Take 1 tablet (500 mg total) by mouth every 12 (twelve) hours for 28 days.      Past Medical History:  Diagnosis Date   Anxiety    Bipolar 1 disorder ([place])    Chronic urticaria    Depression    Diabetes mellitus without complication (HCC)    Hypertension    states she has not took BP medication for 2 years   Insomnia    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   Thyroid disease     Social History   Tobacco Use   Smoking status: Every Day    Packs/day: 0.50    Types: Cigars, Cigarettes   Smokeless tobacco: Never   Tobacco comments:    3 cigars daily, considering quitting; no longer vaping  Vaping Use   Vaping Use: Every day   Substances: Flavoring  Substance Use  Topics   Alcohol use: Not Currently    Comment: Rare   Drug use: No    Family History  Problem Relation Age of Onset   Depression Mother    Mental illness Mother    Asthma Mother    COPD Mother    Diabetes Mother    Mental illness Brother    Mental illness Daughter    Diabetes Maternal Aunt    Mental illness Maternal Aunt    Depression Maternal Aunt    Diabetes Maternal Grandmother     Allergies  Allergen Reactions   Biaxin [Clarithromycin]    Morphine And Related Hives    Says it is okay with benadryl administration   Percocet [Oxycodone-Acetaminophen] Hives and Itching   Shrimp [Shellfish Allergy]    Citalopram Anxiety    aggression   Topiramate Rash    Review of Systems  All other systems reviewed and are negative.  Except as noted above.   OBJECTIVE:    Vitals:   01/23/22 1059  BP: 130/84  Pulse: 82  Temp: 97.9 F (36.6 C)  TempSrc: Oral  SpO2: 96%  Weight: 162 lb (73.5 kg)  Height: _0  (1.676 m)   Body mass index is 26.15 kg/m.  Physical Exam Constitutional:      General: She is not in acute distress.    Appearance: Normal appearance.  HENT:     Head: Normocephalic and atraumatic.  Eyes:     Extraocular Movements: Extraocular movements intact.     Conjunctiva/sclera: Conjunctivae normal.  Pulmonary:     Effort: Pulmonary effort is normal. No respiratory distress.  Abdominal:     General: There is no distension.     Palpations: Abdomen is soft.  Musculoskeletal:     Comments: PICC Line in place.   Skin:    General: Skin is warm and dry.  Neurological:     General: No focal deficit present.     Mental Status: She is alert and oriented to person, place, and time.  Psychiatric:        Mood and Affect: Mood normal.        Behavior: Behavior normal.      Labs and Microbiology:    Latest Ref Rng & Units 01/05/2022    1:42 AM 01/04/2022    4:09 AM 01/02/2022    8:56 PM  CBC  WBC 4.0 - 10.5 K/uL 8.3  8.0  10.9   Hemoglobin 12.0 -  15.0 g/dL 11.2  10.9  10.9   Hematocrit 36.0 - 46.0 % 34.5  32.7  33.5   Platelets 150 - 400 K/uL 451  454  484       Latest Ref Rng & Units 01/05/2022    1:42 AM 01/04/2022    4:09 AM 01/02/2022    8:56 PM  CMP  Glucose 70 - 99 mg/dL 93  97    BUN 6 - 20 mg/dL <5  <5    Creatinine 0.44 - 1.00 mg/dL 0.63  0.58  0.52   Sodium 135 - 145 mmol/L 135  137    Potassium 3.5 - 5.1 mmol/L 3.9  3.7    Chloride 98 - 111 mmol/L 104  106    CO2 22 - 32 mmol/L 26  27    Calcium 8.9 - 10.3 mg/dL 8.2  8.0         ASSESSMENT & PLAN:    Intra-abdominal abscess (HCC) Her formal CT scan read from imaging on 01/18/22 showed significant improvement and she is continuing to feel well following drain removal on that date.  Will have her PICC line removed and stop Ceftriaxone and Flagyl at this time given near complete resolution of intra-abdominal pathology.  She has completed about 3 weeks of IV antibiotics following initial IR drainage and will have her complete 3 more weeks of Augmentin.  Cavitary lesion of lung Her CT scan abd/pelvis on 6/8 noted interval decrease/similar size in cavitary lesion of RLL.  Recommendation was for follow up CT within 6 months to assure resolution.  Will continue antibiotics with Augmentin x 3 more weeks given this ongoing finding.  RTC in 3 months with plan to repeat CT at that time.  Based on results, may need pulmonary follow up at that time.  Advised patient that she can likely hold off on repeat CT this Friday given the short interval from her last scan on 6/8.   PICC (peripherally inserted central catheter) in place Will have PICC line removed.  Penicillin allergy She completed amoxicillin allergy challenge without any issues during her hospitalization and allergy was removed from her list.   Hidradenitis suppurativa She has previously been on Humira and would like to avoid this immunosuppressant in the future.  She will discuss with her dermatologist in the future  about alternative treatment options.      Crystal Mora for Infectious Disease [facility] 01/23/2022, 11:24 AM

## 2022-01-23 NOTE — Patient Instructions (Signed)
Thank you for coming to see me today. It was a pleasure seeing you.  To Do: Stop taking Ceftriaxone IV and Metronidazole by mouth Start taking Augmentin 875 mg twice daily by mouth for 3 more weeks Follow up with me again in 3 months Talk to your dermatologist about different options for HS treatment  If you have any questions or concerns, please do not hesitate to call the office at [phone].  Take Care,   [name]

## 2022-01-23 NOTE — Assessment & Plan Note (Signed)
Will have PICC line removed.

## 2022-01-23 NOTE — Assessment & Plan Note (Signed)
She completed amoxicillin allergy challenge without any issues during her hospitalization and allergy was removed from her list.

## 2022-01-23 NOTE — Assessment & Plan Note (Signed)
Her CT scan abd/pelvis on 6/8 noted interval decrease/similar size in cavitary lesion of RLL.  Recommendation was for follow up CT within 6 months to assure resolution.  Will continue antibiotics with Augmentin x 3 more weeks given this ongoing finding.  RTC in 3 months with plan to repeat CT at that time.  Based on results, may need pulmonary follow up at that time.  Advised patient that she can likely hold off on repeat CT this Friday given the short interval from her last scan on 6/8.

## 2022-01-23 NOTE — Assessment & Plan Note (Signed)
Her formal CT scan read from imaging on 01/18/22 showed significant improvement and she is continuing to feel well following drain removal on that date.  Will have her PICC line removed and stop Ceftriaxone and Flagyl at this time given near complete resolution of intra-abdominal pathology.  She has completed about 3 weeks of IV antibiotics following initial IR drainage and will have her complete 3 more weeks of Augmentin.

## 2022-02-01 ENCOUNTER — Encounter: Payer: Self-pay | Admitting: Internal Medicine

## 2022-02-02 ENCOUNTER — Encounter: Payer: Self-pay | Admitting: Gastroenterology

## 2022-02-02 ENCOUNTER — Ambulatory Visit (INDEPENDENT_AMBULATORY_CARE_PROVIDER_SITE_OTHER): Payer: Medicare Other | Admitting: Gastroenterology

## 2022-02-02 VITALS — BP 136/86 | HR 95 | Ht 66.0 in | Wt 162.0 lb

## 2022-02-02 DIAGNOSIS — Z87898 Personal history of other specified conditions: Secondary | ICD-10-CM | POA: Diagnosis not present

## 2022-02-02 MED ORDER — OMEPRAZOLE 40 MG PO CPDR: 40.0000 mg | DELAYED_RELEASE_CAPSULE | Freq: Two times a day (BID) | ORAL | 11 refills | Status: DC

## 2022-02-06 LAB — FUNGUS CULTURE WITH STAIN

## 2022-02-06 LAB — FUNGAL ORGANISM REFLEX

## 2022-02-06 LAB — FUNGUS CULTURE RESULT

## 2022-02-14 ENCOUNTER — Other Ambulatory Visit: Payer: Self-pay | Admitting: Internal Medicine

## 2022-02-14 NOTE — Telephone Encounter (Signed)
Patient should be done with Augmentin correct? Thanks!

## 2022-02-16 LAB — ACID FAST CULTURE WITH REFLEXED SENSITIVITIES (MYCOBACTERIA): Acid Fast Culture: NEGATIVE

## 2022-02-26 ENCOUNTER — Encounter: Payer: Self-pay | Admitting: Certified Registered Nurse Anesthetist

## 2022-03-05 ENCOUNTER — Encounter: Payer: Medicare Other | Admitting: Gastroenterology

## 2022-03-05 ENCOUNTER — Telehealth: Payer: Self-pay | Admitting: Gastroenterology

## 2022-03-05 NOTE — Telephone Encounter (Signed)
Good Morning Dr. Ardis Hughs,  I called this patient at 9:20 am today and she stated that she was stranded around 4hours away and would call to reschedule.  I will No Show patient- uhc

## 2022-04-07 ENCOUNTER — Other Ambulatory Visit: Payer: Self-pay | Admitting: Gastroenterology

## 2022-04-08 MED ORDER — OMEPRAZOLE 40 MG PO CPDR: 40.0000 mg | DELAYED_RELEASE_CAPSULE | Freq: Two times a day (BID) | ORAL | 5 refills | Status: DC

## 2022-04-26 ENCOUNTER — Encounter: Payer: Self-pay | Admitting: Internal Medicine

## 2022-04-26 ENCOUNTER — Ambulatory Visit (INDEPENDENT_AMBULATORY_CARE_PROVIDER_SITE_OTHER): Payer: Medicare Other | Admitting: Internal Medicine

## 2022-04-26 ENCOUNTER — Other Ambulatory Visit: Payer: Self-pay

## 2022-04-26 VITALS — BP 131/92 | HR 93 | Temp 98.0°F | Wt 163.0 lb

## 2022-04-26 DIAGNOSIS — Z7982 Long term (current) use of aspirin: Secondary | ICD-10-CM | POA: Diagnosis not present

## 2022-04-26 DIAGNOSIS — J984 Other disorders of lung: Secondary | ICD-10-CM

## 2022-04-26 DIAGNOSIS — I10 Essential (primary) hypertension: Secondary | ICD-10-CM | POA: Diagnosis not present

## 2022-04-26 DIAGNOSIS — R1013 Epigastric pain: Secondary | ICD-10-CM | POA: Insufficient documentation

## 2022-04-26 DIAGNOSIS — K279 Peptic ulcer, site unspecified, unspecified as acute or chronic, without hemorrhage or perforation: Secondary | ICD-10-CM | POA: Diagnosis not present

## 2022-04-26 DIAGNOSIS — E119 Type 2 diabetes mellitus without complications: Secondary | ICD-10-CM | POA: Insufficient documentation

## 2022-04-26 NOTE — Progress Notes (Signed)
[facility]  CHIEF COMPLAINT:    Follow up for intra-abdominal abscess  SUBJECTIVE:    Crystal Mora is a 46 y.o. female with PMHx as below who presents to the clinic for intra-abdominal abscess.   Patient here today for routine follow up.  See my notes from 6/9 and 01/23/22 for further details of recent history.    She completed the 3 week course of Augmentin (in addition to the 3 weeks of IV antibiotics prior) since her visit in June and is otherwise continuing to feel well without signs/symptoms of worsening infection.  Her prior imaging also noted a RLL cavitary lesion and she presents today to discuss repeat imaging that was recommended. Yesterday she underwent lumbar radiofrequency ablation.  She has no respiratory symptoms. She reports no fevers.  She reports that she has been dealing with pain management issues and requiring adjustment of her opiates by her pain medication doctor.  She feels that he is not listening to her and has since started utilizing BC powders again about 12 per week.  She is now having a lot of gnawing abdominal pain.  She had an appointment with Dr Ardis Hughs in July that was billed as a no-show.  She has not had an EGD.  She is taking her PPI twice per day.   Please see A&P for the details of today's visit and status of the patient's medical problems.   Patient's Medications  New Prescriptions   No medications on file  Previous Medications   ASPIRIN 81 MG CAPS    Take by mouth.   CETIRIZINE (ZYRTEC) 10 MG TABLET    Take 10 mg by mouth daily.   DIAZEPAM (VALIUM) 5 MG TABLET    Take 5 mg by mouth in the morning, at noon, and at bedtime.   EPINEPHRINE (EPIPEN 2-PAK) 0.3 MG/0.3 ML IJ SOAJ INJECTION    Inject 0.3 mLs (0.3 mg total) into the muscle once as needed for up to 1 dose (for severe allergic reaction). CAll 911 immediately if you have to use this medicine   GABAPENTIN (NEURONTIN) 300 MG CAPSULE    Take 300 mg by mouth  daily as needed (For feet pain).   HYDROCODONE-ACETAMINOPHEN (NORCO/VICODIN) 5-325 MG TABLET    Take 0.5-1 tablets by mouth 3 (three) times daily as needed.   HYDROXYZINE (ATARAX/VISTARIL) 25 MG TABLET    Take 1 every 4-6 hours as needed for itch or hives. (Caution: May cause drowsiness)   METHOCARBAMOL (ROBAXIN) 500 MG TABLET    Take 1 tablet (500 mg total) by mouth 2 (two) times daily.   OMEPRAZOLE (PRILOSEC) 40 MG CAPSULE    Take 1 capsule (40 mg total) by mouth in the morning and at bedtime.   RESTASIS 0.05 % OPHTHALMIC EMULSION    Place 1 drop into both eyes daily as needed for dry eyes.   ROSUVASTATIN (CRESTOR) 5 MG TABLET    Take 5 mg by mouth daily.   VITAMIN D PO    Take 1 tablet by mouth daily.  Modified Medications   No medications on file  Discontinued Medications   No medications on file      Past Medical History:  Diagnosis Date   Anxiety    Bipolar 1 disorder ([place])    Chronic urticaria    Depression    Diabetes mellitus without complication ([place])    Hypertension    states she has not took BP medication for  2 years   Insomnia    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   Thyroid disease     Social History   Tobacco Use   Smoking status: Every Day    Packs/day: 0.50    Types: Cigars, Cigarettes   Smokeless tobacco: Never   Tobacco comments:    3 cigars daily, considering quitting; no longer vaping  Vaping Use   Vaping Use: Every day   Substances: Flavoring  Substance Use Topics   Alcohol use: Not Currently    Comment: Rare   Drug use: No    Family History  Problem Relation Age of Onset   Depression Mother    Mental illness Mother    Asthma Mother    COPD Mother    Diabetes Mother    Mental illness Brother    Mental illness Daughter    Diabetes Maternal Aunt    Mental illness Maternal Aunt    Depression Maternal Aunt    Diabetes Maternal Grandmother     Allergies  Allergen Reactions   Biaxin [Clarithromycin]    Morphine And Related Hives     Says it is okay with benadryl administration   Percocet [Oxycodone-Acetaminophen] Hives and Itching   Shrimp [Shellfish Allergy]    Citalopram Anxiety    aggression   Topiramate Rash    Review of Systems  All other systems reviewed and are negative.  Except as noted above.    OBJECTIVE:    Vitals:   04/26/22 1011  BP: (!) 131/92  Pulse: 93  Temp: 98 F (36.7 C)  TempSrc: Oral  SpO2: 97%  Weight: 163 lb (73.9 kg)   Body mass index is 26.31 kg/m.  Physical Exam Constitutional:      General: She is not in acute distress.    Appearance: Normal appearance.  HENT:     Head: Normocephalic and atraumatic.  Eyes:     Extraocular Movements: Extraocular movements intact.     Conjunctiva/sclera: Conjunctivae normal.  Pulmonary:     Effort: Pulmonary effort is normal. No respiratory distress.  Abdominal:     General: There is no distension.     Palpations: Abdomen is soft.  Musculoskeletal:        General: Normal range of motion.  Skin:    General: Skin is warm and dry.  Neurological:     General: No focal deficit present.     Mental Status: She is alert and oriented to person, place, and time.  Psychiatric:        Mood and Affect: Mood normal.        Behavior: Behavior normal.      Labs and Microbiology:    Latest Ref Rng & Units 01/05/2022    1:42 AM 01/04/2022    4:09 AM 01/02/2022    8:56 PM  CBC  WBC 4.0 - 10.5 K/uL 8.3  8.0  10.9   Hemoglobin 12.0 - 15.0 g/dL 11.2  10.9  10.9   Hematocrit 36.0 - 46.0 % 34.5  32.7  33.5   Platelets 150 - 400 K/uL 451  454  484       Latest Ref Rng & Units 01/05/2022    1:42 AM 01/04/2022    4:09 AM 01/02/2022    8:56 PM  CMP  Glucose 70 - 99 mg/dL 93  97    BUN 6 - 20 mg/dL <5  <5    Creatinine 0.44 - 1.00 mg/dL 0.63  0.58  0.52   Sodium  135 - 145 mmol/L 135  137    Potassium 3.5 - 5.1 mmol/L 3.9  3.7    Chloride 98 - 111 mmol/L 104  106    CO2 22 - 32 mmol/L 26  27    Calcium 8.9 - 10.3 mg/dL 8.2  8.0         ASSESSMENT & PLAN:    Cavitary lesion of lung Her CT scan abd/pelvis on 6/8 noted interval decrease/similar size in cavitary lesion of RLL.  Recommendation was for follow up CT within 6 months to assure resolution.  She completed antibiotics with Augmentin x 3 weeks after 3 weeks of IV ceftriaxone and Flagyl for this and her intra-abdominal infection.  Will plan to repeat CT chest without contrast at this time.  Based on results, may need pulmonary follow up.  Follow up as needed based on CT chest findings.   Peptic ulcer disease Advised she follow up with GI as she missed her appointment in July and needs EGD.  She was also advised to continue PPI as instructed by Dr Ardis Hughs and to abstain from [facility] powders or NSAIDS as recent increase in [facility] seems to have exacerbated her abdominal pain again.    Orders Placed This Encounter  Procedures   CT CHEST WO CONTRAST    Standing Status:   Future    Standing Expiration Date:   04/27/2023    Order Specific Question:   Is patient pregnant?    Answer:   No    Order Specific Question:   Preferred imaging location?    Answer:   [facility] 04/26/2022, 10:30 AM

## 2022-04-26 NOTE — Patient Instructions (Signed)
Thank you for coming to see me today. It was a pleasure seeing you.  To Do: Arrange for follow up CT scan of the chest Follow up as needed based on this  If you have any questions or concerns, please do not hesitate to call the office at [phone].  Take Care,   [name]

## 2022-04-26 NOTE — Assessment & Plan Note (Signed)
Advised she follow up with GI as she missed her appointment in July and needs EGD.  She was also advised to continue PPI as instructed by Dr Ardis Hughs and to abstain from [facility] powders or NSAIDS as recent increase in [facility] seems to have exacerbated her abdominal pain again.

## 2022-04-26 NOTE — Assessment & Plan Note (Signed)
Her CT scan abd/pelvis on 6/8 noted interval decrease/similar size in cavitary lesion of RLL.  Recommendation was for follow up CT within 6 months to assure resolution.  She completed antibiotics with Augmentin x 3 weeks after 3 weeks of IV ceftriaxone and Flagyl for this and her intra-abdominal infection.  Will plan to repeat CT chest without contrast at this time.  Based on results, may need pulmonary follow up.  Follow up as needed based on CT chest findings.

## 2022-04-27 ENCOUNTER — Emergency Department (HOSPITAL_COMMUNITY): Payer: Medicare Other

## 2022-04-27 ENCOUNTER — Other Ambulatory Visit: Payer: Self-pay

## 2022-04-27 ENCOUNTER — Encounter (HOSPITAL_COMMUNITY): Payer: Self-pay | Admitting: Emergency Medicine

## 2022-04-27 ENCOUNTER — Emergency Department (HOSPITAL_COMMUNITY)
Admission: EM | Admit: 2022-04-27 | Discharge: 2022-04-27 | Disposition: A | Discharge status: Home / Self Care | Payer: Medicare Other | Attending: Emergency Medicine | Admitting: Emergency Medicine

## 2022-04-27 DIAGNOSIS — R1013 Epigastric pain: Secondary | ICD-10-CM | POA: Diagnosis not present

## 2022-04-27 LAB — CBC WITH DIFFERENTIAL/PLATELET
Abs Immature Granulocytes: 0.01 10*3/uL (ref 0.00–0.07)
Basophils Absolute: 0 10*3/uL (ref 0.0–0.1)
Basophils Relative: 0 %
Eosinophils Absolute: 0.2 10*3/uL (ref 0.0–0.5)
Eosinophils Relative: 3 %
HCT: 41.7 % (ref 36.0–46.0)
Hemoglobin: 13.8 g/dL (ref 12.0–15.0)
Immature Granulocytes: 0 %
Lymphocytes Relative: 33 %
Lymphs Abs: 2.1 10*3/uL (ref 0.7–4.0)
MCH: 28.2 pg (ref 26.0–34.0)
MCHC: 33.1 g/dL (ref 30.0–36.0)
MCV: 85.1 fL (ref 80.0–100.0)
Monocytes Absolute: 0.4 10*3/uL (ref 0.1–1.0)
Monocytes Relative: 7 %
Neutro Abs: 3.7 10*3/uL (ref 1.7–7.7)
Neutrophils Relative %: 57 %
Platelets: 221 10*3/uL (ref 150–400)
RBC: 4.9 MIL/uL (ref 3.87–5.11)
RDW: 14 % (ref 11.5–15.5)
WBC: 6.4 10*3/uL (ref 4.0–10.5)
nRBC: 0 % (ref 0.0–0.2)

## 2022-04-27 LAB — COMPREHENSIVE METABOLIC PANEL
ALT: 23 U/L (ref 0–44)
AST: 23 U/L (ref 15–41)
Albumin: 3.5 g/dL (ref 3.5–5.0)
Alkaline Phosphatase: 82 U/L (ref 38–126)
Anion gap: 12 (ref 5–15)
BUN: 12 mg/dL (ref 6–20)
CO2: 26 mmol/L (ref 22–32)
Calcium: 9.4 mg/dL (ref 8.9–10.3)
Chloride: 104 mmol/L (ref 98–111)
Creatinine, Ser: 0.82 mg/dL (ref 0.44–1.00)
GFR, Estimated: >60 mL/min (ref >60)
Glucose, Bld: 85 mg/dL (ref 70–99)
Potassium: 3.2 mmol/L — ABNORMAL LOW (ref 3.5–5.1)
Sodium: 142 mmol/L (ref 135–145)
Total Bilirubin: 0.2 mg/dL — ABNORMAL LOW (ref 0.3–1.2)
Total Protein: 6.8 g/dL (ref 6.5–8.1)

## 2022-04-27 LAB — URINALYSIS, ROUTINE W REFLEX MICROSCOPIC
Bacteria, UA: NONE SEEN
Bilirubin Urine: NEGATIVE
Glucose, UA: NEGATIVE mg/dL
Ketones, ur: NEGATIVE mg/dL
Leukocytes,Ua: NEGATIVE
Nitrite: NEGATIVE
Protein, ur: NEGATIVE mg/dL
Specific Gravity, Urine: 1.021 (ref 1.005–1.030)
pH: 5 (ref 5.0–8.0)

## 2022-04-27 LAB — I-STAT BETA HCG BLOOD, ED (MC, WL, AP ONLY): I-stat hCG, quantitative: <5 m[IU]/mL (ref <5)

## 2022-04-27 LAB — LIPASE, BLOOD: Lipase: 38 U/L (ref 11–51)

## 2022-04-27 MED ORDER — FAMOTIDINE IN NACL 20-0.9 MG/50ML-% IV SOLN
20.0000 mg | Freq: Once | INTRAVENOUS | Status: AC
  Administered 2022-04-27: 20 mg via INTRAVENOUS
  Filled 2022-04-27: qty 50

## 2022-04-27 MED ORDER — SUCRALFATE 1 GM/10ML PO SUSP
1.0000 g | Freq: Once | ORAL | Status: AC
  Administered 2022-04-27: 1 g via ORAL
  Filled 2022-04-27: qty 10

## 2022-04-27 MED ORDER — IOHEXOL 350 MG/ML SOLN
80.0000 mL | Freq: Once | INTRAVENOUS | Status: AC | PRN
  Administered 2022-04-27: 80 mL via INTRAVENOUS

## 2022-04-27 MED ORDER — POTASSIUM CHLORIDE CRYS ER 20 MEQ PO TBCR
40.0000 meq | EXTENDED_RELEASE_TABLET | Freq: Once | ORAL | Status: AC
Start: 2022-04-27 — End: 2022-04-27
  Administered 2022-04-27: 40 meq via ORAL
  Filled 2022-04-27: qty 2

## 2022-04-27 MED ORDER — PANTOPRAZOLE SODIUM 40 MG IV SOLR
40.0000 mg | Freq: Once | INTRAVENOUS | Status: AC
  Administered 2022-04-27: 40 mg via INTRAVENOUS
  Filled 2022-04-27: qty 10

## 2022-04-27 MED ORDER — PANTOPRAZOLE SODIUM 40 MG PO TBEC: 40.0000 mg | DELAYED_RELEASE_TABLET | Freq: Two times a day (BID) | ORAL | 0 refills | Status: DC

## 2022-04-27 MED ORDER — ALUM & MAG HYDROXIDE-SIMETH 200-200-20 MG/5ML PO SUSP
30.0000 mL | Freq: Once | ORAL | Status: AC
  Administered 2022-04-27: 30 mL via ORAL
  Filled 2022-04-27: qty 30

## 2022-04-27 MED ORDER — SUCRALFATE 1 GM/10ML PO SUSP: 1.0000 g | Freq: Three times a day (TID) | ORAL | 0 refills | Status: DC

## 2022-04-27 NOTE — ED Triage Notes (Signed)
Pt reported to ED with c/o diffuse abdominal pain. Pt states that pain has been ongoing x1 week. 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

## 2022-04-27 NOTE — ED Notes (Signed)
Discharge instructions reviewed with patient. Follow-up care and prescriptions reviewed. Patient verbalized understanding. Patient A&Ox4, VSS, and ambulatory with steady gait upon discharge.

## 2022-04-27 NOTE — Discharge Instructions (Addendum)
Please avoid taking [facility]y powders or any other NSAIDs.  Take Protonix twice daily before breakfast and bedtime.  To help coat and protect stomach take Carafate 3 times daily before meals and before bedtime.  You can also use Maalox as needed for breakthrough pain.  Avoid spicy or acidic foods and follow eating plan is provided on your paperwork today.  Please make sure you have close follow-up with your GI doctor.  If you develop worsening abdominal pain, persistent vomiting, blood in your stool or dark black stool or have other new or concerning symptoms return for reevaluation.

## 2022-04-27 NOTE — ED Provider Notes (Signed)
[facility] EMERGENCY DEPARTMENT Provider Note   CSN: [id number] Arrival date & time: 04/26/22  2341     History  Chief Complaint  Patient presents with   Abdominal Pain    Crystal Mora is a 46 y.o. female.  Crystal Mora is a 46 y.o. female history of diabetes, hypertension, thyroid disease, bipolar disorder, with prior admission for a liver abscess and abdominal pain, who presents to the emergency department for evaluation of diffuse abdominal pain that has been going on for about a week.  She reports that it is most severe in the epigastric region.  She reports that the pain became intense last night causing her to present for evaluation.  No nausea, vomiting or diarrhea.  No melena or hematochezia.  No fevers or chills.  She reports last time she had abdominal pain she was admitted and had a drain placed for a liver abscess and she wanted to make sure that this was not recurring.  Patient had an ablation done for lumbar radiculitis and sciatica 2 days ago, reports that she has had significant improvement in her low back pain since then but prior to that was taking a lot of Goody powder which has been an issue for her before.  She had been having difficulty managing pain so has started to take it again and this seems to be when her abdominal pain returned.  The history is provided by the patient.  Abdominal Pain Associated symptoms: no chest pain, no chills, no cough, no diarrhea, no fever, no nausea, no shortness of breath and no vomiting        Home Medications Prior to Admission medications   Medication Sig Start Date End Date Taking? Authorizing Provider  pantoprazole (PROTONIX) 40 MG tablet Take 1 tablet (40 mg total) by mouth 2 (two) times daily. 04/27/22 05/27/22 Yes Jacqlyn Larsen, PA-C  sucralfate (CARAFATE) 1 GM/10ML suspension Take 10 mLs (1 g total) by mouth 4 (four) times daily -  with meals and at bedtime. 04/27/22  Yes Jacqlyn Larsen, PA-C   Aspirin 81 MG CAPS Take by mouth.    [provider]  cetirizine (ZYRTEC) 10 MG tablet Take 10 mg by mouth daily.    [provider]  diazepam (VALIUM) 5 MG tablet Take 5 mg by mouth in the morning, at noon, and at bedtime.    [provider]  EPINEPHrine (EPIPEN 2-PAK) 0.3 mg/0.3 mL IJ SOAJ injection Inject 0.3 mLs (0.3 mg total) into the muscle once as needed for up to 1 dose (for severe allergic reaction). CAll 911 immediately if you have to use this medicine 09/24/18   Larene Pickett, PA-C  gabapentin (NEURONTIN) 300 MG capsule Take 300 mg by mouth daily as needed (For feet pain). 01/16/21   [provider]  HYDROcodone-acetaminophen (NORCO/VICODIN) 5-325 MG tablet Take 0.5-1 tablets by mouth 3 (three) times daily as needed. 04/19/22   [provider]  hydrOXYzine (ATARAX/VISTARIL) 25 MG tablet Take 1 every 4-6 hours as needed for itch or hives. (Caution: May cause drowsiness) Patient not taking: Reported on 04/26/2022 02/27/20   Jacqulyn Cane, MD  methocarbamol (ROBAXIN) 500 MG tablet Take 1 tablet (500 mg total) by mouth 2 (two) times daily. 12/16/21   Blue, Soijett A, PA-C  RESTASIS 0.05 % ophthalmic emulsion Place 1 drop into both eyes daily as needed for dry eyes. 12/20/21   [provider]  rosuvastatin (CRESTOR) 5 MG tablet Take 5 mg by  mouth daily. 10/13/21   [provider]  VITAMIN D PO Take 1 tablet by mouth daily.    [provider]      Allergies    Biaxin [clarithromycin], Morphine and related, Percocet [oxycodone-acetaminophen], Shrimp [shellfish allergy], Citalopram, and Topiramate    Review of Systems   Review of Systems  Constitutional:  Negative for chills and fever.  HENT: Negative.    Respiratory:  Negative for cough and shortness of breath.   Cardiovascular:  Negative for chest pain.  Gastrointestinal:  Positive for abdominal pain. Negative for diarrhea, nausea and vomiting.  Musculoskeletal:  Negative  for arthralgias and back pain.  Neurological:  Negative for weakness and numbness.    Physical Exam Updated Vital Signs BP 132/83 (BP Location: Left Arm)   Pulse 71   Temp 98 F (36.7 C) (Oral)   Resp 17   SpO2 98%  Physical Exam Vitals and nursing note reviewed.  Constitutional:      General: She is not in acute distress.    Appearance: Normal appearance. She is well-developed. She is not diaphoretic.  HENT:     Head: Normocephalic and atraumatic.  Eyes:     General:        Right eye: No discharge.        Left eye: No discharge.     Pupils: Pupils are equal, round, and reactive to light.  Cardiovascular:     Rate and Rhythm: Normal rate and regular rhythm.     Pulses: Normal pulses.     Heart sounds: Normal heart sounds.  Pulmonary:     Effort: Pulmonary effort is normal. No respiratory distress.     Breath sounds: Normal breath sounds. No wheezing or rales.     Comments: Respirations equal and unlabored, patient able to speak in full sentences, lungs clear to auscultation bilaterally  Abdominal:     General: Bowel sounds are normal. There is no distension.     Palpations: Abdomen is soft. There is no mass.     Tenderness: There is abdominal tenderness. There is no guarding.     Comments: Abdomen soft, nondistended, bowel sounds present, there is some focal tenderness in the epigastric region, all other quadrants nontender to palpation, no guarding or peritoneal signs  Musculoskeletal:        General: No deformity.     Cervical back: Neck supple.  Skin:    General: Skin is warm and dry.     Capillary Refill: Capillary refill takes less than 2 seconds.  Neurological:     Mental Status: She is alert and oriented to person, place, and time.     Coordination: Coordination normal.     Comments: Speech is clear, able to follow commands Moves extremities without ataxia, coordination intact  Psychiatric:        Mood and Affect: Mood normal.        Behavior: Behavior normal.      ED Results / Procedures / Treatments   Labs (all labs ordered are listed, but only abnormal results are displayed) Labs Reviewed  COMPREHENSIVE METABOLIC PANEL - Abnormal; Notable for the following components:      Result Value   Potassium 3.2 (*)    Total Bilirubin 0.2 (*)    All other components within normal limits  URINALYSIS, ROUTINE W REFLEX MICROSCOPIC - Abnormal; Notable for the following components:   Hgb urine dipstick MODERATE (*)    All other components within normal limits  CBC WITH DIFFERENTIAL/PLATELET  LIPASE, BLOOD  I-STAT BETA HCG BLOOD, ED ([facility], [facility], [facility] ONLY)    EKG None  Radiology CT L-SPINE NO CHARGE  Result Date: 04/27/2022 CLINICAL DATA:  46 year old female with low back pain EXAM: CT LUMBAR SPINE WITHOUT CONTRAST TECHNIQUE: Technique: Multiplanar CT images of the lumbar spine were reconstructed from contemporary CT of the Abdomen and Pelvis. RADIATION DOSE REDUCTION: This exam was performed according to the departmental dose-optimization program which includes automated exposure control, adjustment of the mA and/or kV according to patient size and/or use of iterative reconstruction technique. CONTRAST:  None COMPARISON:  CT Abdomen and Pelvis today reported separately. CT Abdomen and Pelvis 01/18/2022. FINDINGS: Segmentation: Normal. Alignment: Preserved lumbar lordosis, stable since June. No spondylolisthesis or scoliosis. Vertebrae: Ununited right L1 transverse process ossification center, normal variant. Normal background bone mineralization. No acute osseous abnormality identified. Visible sacrum and SI joints appear intact. Paraspinal and other soft tissues: Abdominal and pelvic viscera are detailed separately. Lumbar paraspinal soft tissues are within normal limits. Disc levels: T12-L1:  Negative. L1-L2:  Negative. L2-L3:  Minimal disc bulge.  Mild facet hypertrophy.  No stenosis. L3-L4:  Negative. L4-L5:  Negative. L5-S1: Mild disc space loss. Mild  circumferential disc bulge. Trace vacuum disc. Mild facet hypertrophy. Asymmetric leftward disc material tracking into the left lateral recess, seen on series 3, image 47 of the comparison. Moderate to severe left lateral recess stenosis at the left S1 nerve level. Mild if any associated spinal and left L5 foraminal stenosis. IMPRESSION: 1. Leftward L5-S1 disc herniation into the left lateral recess stenosis. Query Left S1 radiculitis. Up to mild associated spinal and left L5 neural foraminal stenosis. 2. Other lumbar levels appear normal for age. No acute osseous abnormality in the lumbar spine. CT Abdomen and Pelvis today are reported separately. Electronically Signed   By: Genevie Ann M.D.   On: 04/27/2022 04:34   CT ABDOMEN PELVIS W CONTRAST  Result Date: 04/27/2022 CLINICAL DATA:  Left lower quadrant abdominal pain. EXAM: CT ABDOMEN AND PELVIS WITH CONTRAST TECHNIQUE: Multidetector CT imaging of the abdomen and pelvis was performed using the standard protocol following bolus administration of intravenous contrast. RADIATION DOSE REDUCTION: This exam was performed according to the departmental dose-optimization program which includes automated exposure control, adjustment of the mA and/or kV according to patient size and/or use of iterative reconstruction technique. CONTRAST:  26mL OMNIPAQUE IOHEXOL 350 MG/ML SOLN COMPARISON:  January 18, 2022 FINDINGS: Lower chest: No acute abnormality. The small cavitary lesion seen within the right lung base on the prior study is not visualized. Hepatobiliary: No focal liver abnormality is seen. The pigtail drainage catheter seen within the deep margin of the left lobe of the liver on the prior study has been removed. The gallbladder is contracted without evidence of gallstones, gallbladder wall thickening, or biliary dilatation. Pancreas: Unremarkable. No pancreatic ductal dilatation or surrounding inflammatory changes. Spleen: Normal in size without focal abnormality.  Adrenals/Urinary Tract: Adrenal glands are unremarkable. Kidneys are normal, without renal calculi, focal lesion, or hydronephrosis. Bladder is unremarkable. Stomach/Bowel: Stomach is within normal limits. Appendix appears normal. No evidence of bowel wall thickening, distention, or inflammatory changes. Vascular/Lymphatic: No significant vascular findings are present. Bilateral subcentimeter inguinal lymph nodes are noted. Reproductive: The uterus is unremarkable. A 3.6 cm x 2.5 cm right adnexal cyst is seen. This measures approximately 3.5 cm in diameter on the prior study. The left adnexa is unremarkable. Other: No abdominal wall hernia or abnormality. No abdominopelvic ascites. Musculoskeletal: No acute or significant osseous  findings. IMPRESSION: 1. Interval removal of the pigtail drainage catheter within the deep margin of the left lobe of the liver on the prior study. 2. Right adnexal cyst, now measuring 3.6 cm x 2.5 cm. Follow-up pelvic ultrasound in 6 months is again recommended to confirm stability. Electronically Signed   By: Virgina Norfolk M.D.   On: 04/27/2022 03:49    Procedures Procedures    Medications Ordered in ED Medications  iohexol (OMNIPAQUE) 350 MG/ML injection 80 mL (80 mLs Intravenous Contrast Given 04/27/22 0336)  pantoprazole (PROTONIX) injection 40 mg (40 mg Intravenous Given 04/27/22 0904)  famotidine (PEPCID) IVPB 20 mg premix (0 mg Intravenous Stopped 04/27/22 0951)  alum & mag hydroxide-simeth (MAALOX/MYLANTA) 200-200-20 MG/5ML suspension 30 mL (30 mLs Oral Given 04/27/22 0903)  sucralfate (CARAFATE) 1 GM/10ML suspension 1 g (1 g Oral Given 04/27/22 0924)  potassium chloride SA (KLOR-CON M) CR tablet 40 mEq (40 mEq Oral Given 04/27/22 8850)    ED Course/ Medical Decision Making/ A&P                           Medical Decision Making Risk OTC drugs. Prescription drug management.   46 y.o. female presents to the ED with complaints of abdominal pain, this involves an  extensive number of treatment options, and is a complaint that carries with it a high risk of complications and morbidity.  The differential diagnosis includes gastritis, peptic ulcer, gastroenteritis, pancreatitis, cholecystitis  On arrival pt is nontoxic, vitals significant for mild hypertension, otherwise normal.   Additional history obtained from chart review. Previous records obtained and reviewed   I ordered medication including IV Protonix, Pepcid, Maalox and Carafate as well as potassium replacement  Lab Tests:  I Ordered, reviewed, and interpreted labs, which included: No leukocytosis and normal hemoglobin, mild hypokalemia of 3.2, no other electrolyte derangements, normal renal liver function and normal lipase.  UA with some RBCs present but no other signs of infection.  Negative pregnancy.  Imaging Studies ordered:  I ordered imaging studies which included CT abdomen pelvis with lumbar recon given patient's recent nerve ablation, I independently visualized and interpreted imaging which showed interval removal of drainage catheter to the left lobe of the liver with no residual abscess or fluid collection, right adnexal cyst, no other acute abnormalities.  Lumbar CT shows left S1 radiculitis.  ED Course:   I have high clinical suspicion that patient's symptoms are related to her recent increase in use of Goody powder, suspect gastritis or peptic ulcer, no signs of bleeding ulcer at this time and no evidence of perforation on CT.  Patient is well-appearing and hemodynamically stable.  Patient treated supportively with PPI, H2 blocker, Maalox and Carafate with resolution of pain.  No tenderness on repeat abdominal exam and patient tolerating p.o. fluids.  I discussed reassuring results and imaging with patient at bedside.  Counseled patient on avoiding NSAIDs and in particular Goody powder, patient has been on omeprazole, will transition to Protonix and prescribed Carafate as well.  Patient  is working on setting up follow-up appointment with Dr. Ardis Hughs with GI.  Discussed return precautions with patient and she expresses understanding and agreement.  Discharged home in good condition.  At this time there does not appear to be any evidence of an acute emergency medical condition requiring further emergent evaluation and the patient appears stable for discharge with appropriate outpatient follow up. Diagnosis and return precautions discussed with patient who verbalizes understanding and  is agreeable to discharge.     Portions of this note were generated with L[employer]. Dictation errors may occur despite best attempts at proofreading.         Final Clinical Impression(s) / ED Diagnoses Final diagnoses:  Epigastric pain    Rx / DC Orders ED Discharge Orders          Ordered    pantoprazole (PROTONIX) 40 MG tablet  2 times daily        04/27/22 1041    sucralfate (CARAFATE) 1 GM/10ML suspension  3 times daily with meals & bedtime        04/27/22 1041              Benedetto Goad [place], [place] 04/27/22 1352    Malvin Johns, MD 04/27/22 1459

## 2022-04-27 NOTE — ED Provider Triage Note (Signed)
Emergency Medicine Provider Triage Evaluation Note  Crystal Mora , a 46 y.o. female  was evaluated in triage.  Pt complains of stabbing Abdominal pain in the epigastrium as well as now new pain in the left lower abdomen greater than right.  No nausea vomiting or diarrhea, denies melena or hematochezia that she she is not really being attention.  No urinary symptoms.  History of PUD and ablation of the lumbar spine yesterday.  Review of Systems  Positive: Abdominal pain Negative: Nausea vomiting diarrhea, fevers, chills  Physical Exam  BP (!) 156/98 (BP Location: Right Arm)   Pulse 78   Temp 98 F (36.7 C)   Resp 18   SpO2 100%  Gen:   Awake, no distress   Resp:  Normal effort  MSK:   Moves extremities without difficulty  Other:   generalized abdominal tenderness but most severe left lower quadrant and epigastrium.  No CVAT.  Medical Decision Making  Medically screening exam initiated at 12:50 AM.  Appropriate orders placed.  [name] was informed that the remainder of the evaluation will be completed by another provider, this initial triage assessment does not replace that evaluation, and the importance of remaining in the ED until their evaluation is complete.  This chart was dictated using voice recognition software, Dragon. Despite the best efforts of this provider to proofread and correct errors, errors may still occur which can change documentation meaning.    Emeline Darling, PA-C 04/27/22 [phone]

## 2022-05-08 ENCOUNTER — Ambulatory Visit (HOSPITAL_COMMUNITY): Admission: RE | Admit: 2022-05-08 | Payer: Medicare Other | Source: Ambulatory Visit

## 2022-05-28 ENCOUNTER — Ambulatory Visit (HOSPITAL_COMMUNITY)
Admission: RE | Admit: 2022-05-28 | Discharge: 2022-05-28 | Disposition: A | Discharge status: Home / Self Care | Payer: Medicare Other | Source: Ambulatory Visit | Attending: Internal Medicine | Admitting: Internal Medicine

## 2022-05-28 DIAGNOSIS — J984 Other disorders of lung: Secondary | ICD-10-CM | POA: Insufficient documentation

## 2022-05-31 ENCOUNTER — Ambulatory Visit: Payer: Medicare Other | Admitting: Nurse Practitioner

## 2022-05-31 ENCOUNTER — Encounter: Payer: Self-pay | Admitting: Internal Medicine

## 2022-05-31 ENCOUNTER — Other Ambulatory Visit: Payer: Self-pay

## 2022-05-31 ENCOUNTER — Ambulatory Visit (INDEPENDENT_AMBULATORY_CARE_PROVIDER_SITE_OTHER): Payer: Medicare Other | Admitting: Internal Medicine

## 2022-05-31 VITALS — BP 134/88 | HR 75 | Temp 97.9°F | Ht 65.0 in | Wt 169.0 lb

## 2022-05-31 DIAGNOSIS — J984 Other disorders of lung: Secondary | ICD-10-CM

## 2022-05-31 NOTE — Progress Notes (Signed)
[facility]  CHIEF COMPLAINT:    Follow up for cavitary lung lesion  SUBJECTIVE:    Crystal Mora is a 46 y.o. female with PMHx as below who presents to the clinic for cavitary lung lesion.   Patient is here today for follow up.  She was seen by myself previously on 6/9, 6/13, and 9/14 for routine follow up of intra-abdominal infection that was treated with catheter drainage and antibiotics.  In total she completed 6 weeks of antibiotics with CTX/Flagyl x 3 weeks then 3 weeks of Augmentin.  The intra-abdominal process has been treated and dealt with and she had a CT abd/pelvis on 9/15 that was stable.    She is here today because as part of her initial work up earlier this year, she was noted to have a cavitary lung lesion with no pulmonary symptoms.  A follow up chest CT was obtained recently that showed persistence of this cavitary lesion.  Tissue sampling was recommended to be considered.    Please see A&P for the details of today's visit and status of the patient's medical problems.   Patient's Medications  New Prescriptions   No medications on file  Previous Medications   AMLODIPINE (NORVASC) 10 MG TABLET    Take 1 tablet by mouth daily.   ASPIRIN 81 MG CAPS    Take by mouth.   CETIRIZINE (ZYRTEC) 10 MG TABLET    Take 10 mg by mouth daily.   DIAZEPAM (VALIUM) 5 MG TABLET    Take 5 mg by mouth in the morning, at noon, and at bedtime.   EPINEPHRINE (EPIPEN 2-PAK) 0.3 MG/0.3 ML IJ SOAJ INJECTION    Inject 0.3 mLs (0.3 mg total) into the muscle once as needed for up to 1 dose (for severe allergic reaction). CAll 911 immediately if you have to use this medicine   GABAPENTIN (NEURONTIN) 300 MG CAPSULE    Take 300 mg by mouth daily as needed (For feet pain).   HYDROCODONE-ACETAMINOPHEN (NORCO/VICODIN) 5-325 MG TABLET    Take 0.5-1 tablets by mouth 3 (three) times daily as needed. 7.5 mg per patient   HYDROXYZINE (ATARAX/VISTARIL) 25 MG TABLET     Take 1 every 4-6 hours as needed for itch or hives. (Caution: May cause drowsiness)   METHOCARBAMOL (ROBAXIN) 500 MG TABLET    Take 1 tablet (500 mg total) by mouth 2 (two) times daily.   PANTOPRAZOLE (PROTONIX) 40 MG TABLET    Take 1 tablet (40 mg total) by mouth 2 (two) times daily.   RESTASIS 0.05 % OPHTHALMIC EMULSION    Place 1 drop into both eyes daily as needed for dry eyes.   ROSUVASTATIN (CRESTOR) 5 MG TABLET    Take 5 mg by mouth daily.   SUCRALFATE (CARAFATE) 1 GM/10ML SUSPENSION    Take 10 mLs (1 g total) by mouth 4 (four) times daily -  with meals and at bedtime.   VITAMIN D PO    Take 1 tablet by mouth daily.  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   Thyroid disease     Social History   Tobacco Use   Smoking status: Every Day    Packs/day: 0.50    Types: Cigars, Cigarettes   Smokeless tobacco: Never   Tobacco comments:    1 black and mild a day, considering quitting; no longer vaping  Vaping Use   Vaping Use: Every day   Substances: Flavoring  Substance Use Topics   Alcohol use: Not Currently    Comment: Rare   Drug use: No    Family History  Problem Relation Age of Onset   Depression Mother    Mental illness Mother    Asthma Mother    COPD Mother    Diabetes Mother    Mental illness Brother    Mental illness Daughter    Diabetes Maternal Aunt    Mental illness Maternal Aunt    Depression Maternal Aunt    Diabetes Maternal Grandmother     Allergies  Allergen Reactions   Biaxin [Clarithromycin]    Morphine And Related Hives    Says it is okay with benadryl administration   Percocet  [Oxycodone-Acetaminophen] Hives and Itching   Shrimp [Shellfish Allergy]    Citalopram Anxiety    aggression   Topiramate Rash    Review of Systems  All other systems reviewed and are negative.    OBJECTIVE:    Vitals:   05/31/22 1016  BP: 134/88  Pulse: 75  Temp: 97.9 F (36.6 C)  TempSrc: Oral  SpO2: 99%  Weight: 169 lb (76.7 kg)  Height: 5\' 5"  (1.651 m)   Body mass index is 28.12 kg/m.  Physical Exam Constitutional:      Appearance: Normal appearance.  HENT:     Head: Normocephalic and atraumatic.  Eyes:     Extraocular Movements: Extraocular movements intact.     Conjunctiva/sclera: Conjunctivae normal.  Pulmonary:     Effort: Pulmonary effort is normal. No respiratory distress.  Abdominal:     General: There is no distension.     Palpations: Abdomen is soft.  Musculoskeletal:     Cervical back: Normal range of motion and neck supple.  Skin:    General: Skin is warm and dry.  Neurological:     General: No focal deficit present.     Mental Status: She is alert and oriented to person, place, and time.  Psychiatric:        Mood and Affect: Mood normal.        Behavior: Behavior normal.      Labs and Microbiology:    Latest Ref Rng & Units 04/27/2022    1:05 AM 01/05/2022    1:42 AM 01/04/2022    4:09 AM  CBC  WBC 4.0 - 10.5 K/uL 6.4  8.3  8.0   Hemoglobin 12.0 - 15.0 g/dL 13.8  11.2  10.9   Hematocrit 36.0 - 46.0 % 41.7  34.5  32.7   Platelets 150 - 400 K/uL 221  451  454       Latest Ref Rng & Units 04/27/2022    1:05 AM 01/05/2022    1:42 AM 01/04/2022    4:09 AM  CMP  Glucose 70 - 99 mg/dL 85  93  97   BUN 6 - 20 mg/dL 12  <5  <5   Creatinine 0.44 - 1.00 mg/dL 0.82  0.63  0.58   Sodium 135 - 145 mmol/L 142  135  137   Potassium  3.5 - 5.1 mmol/L 3.2  3.9  3.7   Chloride 98 - 111 mmol/L 104  104  106   CO2 22 - 32 mmol/L 26  26  27    Calcium 8.9 - 10.3 mg/dL 9.4  8.2  8.0   Total Protein 6.5 - 8.1 g/dL 6.8     Total Bilirubin 0.3 - 1.2  mg/dL 0.2     Alkaline Phos 38 - 126 U/L 82     AST 15 - 41 U/L 23     ALT 0 - 44 U/L 23         ASSESSMENT & PLAN:    Cavitary lesion of lung At this time, will refer to pulmonary for further evaluation of cavitary lung lesion to consider bronchoscopy for tissue sampling.  I will see her back as needed following this work up.     Orders Placed This Encounter  Procedures   Ambulatory referral to Pulmonology    Referral Priority:   Routine    Referral Type:   Consultation    Referral Reason:   Specialty Services Required    Requested Specialty:   Pulmonary Disease    Number of Visits Requested:   [facility] 05/31/2022, 10:59 AM

## 2022-05-31 NOTE — Assessment & Plan Note (Addendum)
At this time, will refer to pulmonary for further evaluation of cavitary lung lesion to consider bronchoscopy for tissue sampling.  I will see her back as needed following this work up.

## 2022-06-14 ENCOUNTER — Encounter: Payer: Self-pay | Admitting: Pulmonary Disease

## 2022-06-14 ENCOUNTER — Ambulatory Visit (INDEPENDENT_AMBULATORY_CARE_PROVIDER_SITE_OTHER): Payer: Medicare Other | Admitting: Pulmonary Disease

## 2022-06-14 VITALS — BP 110/80 | HR 90 | Ht 65.0 in | Wt 174.2 lb

## 2022-06-14 DIAGNOSIS — R911 Solitary pulmonary nodule: Secondary | ICD-10-CM

## 2022-06-14 DIAGNOSIS — J984 Other disorders of lung: Secondary | ICD-10-CM

## 2022-06-14 NOTE — Progress Notes (Signed)
Synopsis: Referred in November 2023 for right lower lobe pulmonary nodule by Mignon Pine, DO  Subjective:   PATIENT ID: Crystal Mora GENDER: female DOB: 1976-01-12, MRN: [id number]  Chief Complaint  Patient presents with   Consult    Lung nodules    This is a 46 year old female, past medical history of bipolar disease, diabetes, hypertension.  She is longstanding history of tobacco use since she was a teenager.  Current every day smoker smokes cigarettes cigars and vapes.  Currently using 2 cigars every other day.  She is trying to quit smoking.  She was recently admitted to the hospital with liver abscess that required drainage.  She had a nodule found in the right lower lobe of the lung in May 2023.  She had follow-up CT imaging that shows persistence of of the lesion in October 2023.  She has a family history of lung cancer.  2 uncles and an aunt that have lung cancer.  She also had a 30 year old cousin that had kidney cancer.    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   Thyroid disease      Family History  Problem Relation Age of Onset   Depression Mother    Mental illness Mother    Asthma Mother    COPD Mother    Diabetes Mother    Mental illness Brother    Mental illness Daughter    Diabetes Maternal Aunt    Mental illness Maternal Aunt    Depression Maternal Aunt    Diabetes Maternal Grandmother      Past Surgical History:  Procedure Laterality Date   CESAREAN SECTION     IR RADIOLOGIST EVAL & MGMT  01/18/2022   THYROID SURGERY     TUBAL LIGATION     tubal reversal Bilateral     Social History   Socioeconomic History   Marital status: Divorced    Spouse name: Not on file   Number of children: Not on file    Years of education: Not on file   Highest education level: Not on file  Occupational History   Not on file  Tobacco Use   Smoking status: Every Day    Packs/day: 0.50    Types: Cigars, Cigarettes   Smokeless tobacco: Never   Tobacco comments:    Smokes 1-2 black and Mild every other day and vape every day. 06/14/2022 [place]  Vaping Use   Vaping Use: Every day   Substances: Flavoring  Substance and Sexual Activity   Alcohol use: Not Currently    Comment: Rare   Drug use: No   Sexual activity: Yes    Birth control/protection: Surgical  Other Topics Concern   Not on file  Social History Narrative   Not on file   Social Determinants of Health   Financial Resource Strain: Not on file  Food Insecurity: Not on file  Transportation Needs: Not on file  Physical Activity: Not on file  Stress: Not on file  Social Connections: Not on file  Intimate Partner Violence: Not on file     Allergies  Allergen Reactions   Biaxin [Clarithromycin]    Morphine And Related  Hives    Says it is okay with benadryl administration   Percocet [Oxycodone-Acetaminophen] Hives and Itching   Shrimp [Shellfish Allergy]    Citalopram Anxiety    aggression   Topiramate Rash     Outpatient Medications Prior to Visit  Medication Sig Dispense Refill   cetirizine (ZYRTEC) 10 MG tablet Take 10 mg by mouth daily.     amLODipine (NORVASC) 10 MG tablet Take 1 tablet by mouth daily.     Aspirin 81 MG CAPS Take by mouth.     diazepam (VALIUM) 5 MG tablet Take 5 mg by mouth in the morning, at noon, and at bedtime.     EPINEPHrine (EPIPEN 2-PAK) 0.3 mg/0.3 mL IJ SOAJ injection Inject 0.3 mLs (0.3 mg total) into the muscle once as needed for up to 1 dose (for severe allergic reaction). CAll 911 immediately if you have to use this medicine 1 Device 1   gabapentin (NEURONTIN) 300 MG capsule Take 300 mg by mouth daily as needed (For feet pain). (Patient not taking: Reported on 05/31/2022)      HYDROcodone-acetaminophen (NORCO/VICODIN) 5-325 MG tablet Take 0.5-1 tablets by mouth 3 (three) times daily as needed. 7.5 mg per patient     hydrOXYzine (ATARAX/VISTARIL) 25 MG tablet Take 1 every 4-6 hours as needed for itch or hives. (Caution: May cause drowsiness) (Patient not taking: Reported on 04/26/2022) 60 tablet 0   methocarbamol (ROBAXIN) 500 MG tablet Take 1 tablet (500 mg total) by mouth 2 (two) times daily. (Patient not taking: Reported on 05/31/2022) 20 tablet 0   pantoprazole (PROTONIX) 40 MG tablet Take 1 tablet (40 mg total) by mouth 2 (two) times daily. (Patient not taking: Reported on 05/31/2022) 60 tablet 0   RESTASIS 0.05 % ophthalmic emulsion Place 1 drop into both eyes daily as needed for dry eyes.     rosuvastatin (CRESTOR) 5 MG tablet Take 5 mg by mouth daily.     sucralfate (CARAFATE) 1 GM/10ML suspension Take 10 mLs (1 g total) by mouth 4 (four) times daily -  with meals and at bedtime. (Patient not taking: Reported on 05/31/2022) 420 mL 0   VITAMIN D PO Take 1 tablet by mouth daily.     No facility-administered medications prior to visit.    Review of Systems  Constitutional:  Negative for chills, fever, malaise/fatigue and weight loss.  HENT:  Negative for hearing loss, sore throat and tinnitus.   Eyes:  Negative for blurred vision and double vision.  Respiratory:  Positive for shortness of breath. Negative for cough, hemoptysis, sputum production, wheezing and stridor.   Cardiovascular:  Negative for chest pain, palpitations, orthopnea, leg swelling and PND.  Gastrointestinal:  Negative for abdominal pain, constipation, diarrhea, heartburn, nausea and vomiting.  Genitourinary:  Negative for dysuria, hematuria and urgency.  Musculoskeletal:  Negative for joint pain and myalgias.  Skin:  Negative for itching and rash.  Neurological:  Negative for dizziness, tingling, weakness and headaches.  Endo/Heme/Allergies:  Negative for environmental allergies. Does not  bruise/bleed easily.  Psychiatric/Behavioral:  Negative for depression. The patient is nervous/anxious. The patient does not have insomnia.   All other systems reviewed and are negative.    Objective:  Physical Exam Vitals reviewed.  Constitutional:      General: She is not in acute distress.    Appearance: She is well-developed.  HENT:     Head: Normocephalic and atraumatic.  Eyes:     General: No scleral icterus.    Conjunctiva/sclera: Conjunctivae normal.  Pupils: Pupils are equal, round, and reactive to light.  Neck:     Vascular: No JVD.     Trachea: No tracheal deviation.  Cardiovascular:     Rate and Rhythm: Normal rate and regular rhythm.     Heart sounds: Normal heart sounds. No murmur heard. Pulmonary:     Effort: Pulmonary effort is normal. No tachypnea, accessory muscle usage or respiratory distress.     Breath sounds: No stridor. No wheezing, rhonchi or rales.     Comments: Diminished breath sounds bilaterally Abdominal:     General: There is no distension.     Palpations: Abdomen is soft.     Tenderness: There is no abdominal tenderness.  Musculoskeletal:        General: No tenderness.     Cervical back: Neck supple.  Lymphadenopathy:     Cervical: No cervical adenopathy.  Skin:    General: Skin is warm and dry.     Capillary Refill: Capillary refill takes less than 2 seconds.     Findings: No rash.  Neurological:     Mental Status: She is alert and oriented to person, place, and time.  Psychiatric:        Behavior: Behavior normal.      Vitals:   06/14/22 1318  BP: 110/80  Pulse: 90  SpO2: 98%  Weight: 174 lb 3.2 oz (79 kg)  Height: 5\' 5"  (1.651 m)   98% on RA BMI Readings from Last 3 Encounters:  06/14/22 28.99 kg/m  05/31/22 28.12 kg/m  04/26/22 26.31 kg/m   Wt Readings from Last 3 Encounters:  06/14/22 174 lb 3.2 oz (79 kg)  05/31/22 169 lb (76.7 kg)  04/26/22 163 lb (73.9 kg)     CBC    Component Value Date/Time   WBC  6.4 04/27/2022 0105   RBC 4.90 04/27/2022 0105   HGB 13.8 04/27/2022 0105   HCT 41.7 04/27/2022 0105   PLT 221 04/27/2022 0105   MCV 85.1 04/27/2022 0105   MCH 28.2 04/27/2022 0105   MCHC 33.1 04/27/2022 0105   RDW 14.0 04/27/2022 0105   LYMPHSABS 2.1 04/27/2022 0105   MONOABS 0.4 04/27/2022 0105   EOSABS 0.2 04/27/2022 0105   BASOSABS 0.0 04/27/2022 0105     Chest Imaging: October 2023: Increased size of a 14 x 9 mm subpleural cavitary nodule.  Posterior lateral right lower lobe. The patient's images have been independently reviewed by me.    Pulmonary Functions Testing Results:     No data to display          FeNO:   Pathology:   Echocardiogram:   Heart Catheterization:     Assessment & Plan:     ICD-10-CM   1. Lung nodule  R91.1 NM PET Image Initial (PI) Skull Base To Thigh (F-18 FDG)    Ambulatory referral to Cardiothoracic Surgery    Pulmonary Function Test    2. Cavitary lesion of lung  J98.4       Discussion:  This is a 46 year old female, lung nodule in the right lower lobe, cavitary lesion.  Longstanding history of tobacco use and family history of lung cancer.  Plan: I think that she needs to be worked up for consideration of surgery for removal of the lesion if there is high probability of malignancy. I will have thoracic surgery take a look at her CT imaging. The morphology of the lesion is concerning for primary lung cancer. We will obtain a nuclear medicine PET scan  as well as PFTs. Hopefully we can have both of these completed prior to her seeing thoracic surgery.     Current Outpatient Medications:    cetirizine (ZYRTEC) 10 MG tablet, Take 10 mg by mouth daily., Disp: , Rfl:    amLODipine (NORVASC) 10 MG tablet, Take 1 tablet by mouth daily., Disp: , Rfl:    Aspirin 81 MG CAPS, Take by mouth., Disp: , Rfl:    diazepam (VALIUM) 5 MG tablet, Take 5 mg by mouth in the morning, at noon, and at bedtime., Disp: , Rfl:    EPINEPHrine (EPIPEN  2-PAK) 0.3 mg/0.3 mL IJ SOAJ injection, Inject 0.3 mLs (0.3 mg total) into the muscle once as needed for up to 1 dose (for severe allergic reaction). CAll 911 immediately if you have to use this medicine, Disp: 1 Device, Rfl: 1   gabapentin (NEURONTIN) 300 MG capsule, Take 300 mg by mouth daily as needed (For feet pain). (Patient not taking: Reported on 05/31/2022), Disp: , Rfl:    HYDROcodone-acetaminophen (NORCO/VICODIN) 5-325 MG tablet, Take 0.5-1 tablets by mouth 3 (three) times daily as needed. 7.5 mg per patient, Disp: , Rfl:    hydrOXYzine (ATARAX/VISTARIL) 25 MG tablet, Take 1 every 4-6 hours as needed for itch or hives. (Caution: May cause drowsiness) (Patient not taking: Reported on 04/26/2022), Disp: 60 tablet, Rfl: 0   methocarbamol (ROBAXIN) 500 MG tablet, Take 1 tablet (500 mg total) by mouth 2 (two) times daily. (Patient not taking: Reported on 05/31/2022), Disp: 20 tablet, Rfl: 0   pantoprazole (PROTONIX) 40 MG tablet, Take 1 tablet (40 mg total) by mouth 2 (two) times daily. (Patient not taking: Reported on 05/31/2022), Disp: 60 tablet, Rfl: 0   RESTASIS 0.05 % ophthalmic emulsion, Place 1 drop into both eyes daily as needed for dry eyes., Disp: , Rfl:    rosuvastatin (CRESTOR) 5 MG tablet, Take 5 mg by mouth daily., Disp: , Rfl:    sucralfate (CARAFATE) 1 GM/10ML suspension, Take 10 mLs (1 g total) by mouth 4 (four) times daily -  with meals and at bedtime. (Patient not taking: Reported on 05/31/2022), Disp: 420 mL, Rfl: 0   VITAMIN D PO, Take 1 tablet by mouth daily., Disp: , Rfl:   Garner Nash, DO [facility] 06/14/2022 2:43 PM

## 2022-06-14 NOTE — Progress Notes (Addendum)
06/14/2022 [name] [id number] [birth year]   Chief Complaint: Abdominal pain   History of Present Illness: Crystal Mora is a 46 year old female with a past medical history of anxiety, depression, bipolar disorder, hypertension, DM II, hypothyroidism, right lower lung nodule and a gastric ulcer with perforation. She was seen in office by Dr. Ardis Hughs 02/02/2022 after being hospitalized 12/2021 with severe abdominal pain and imaging suggested a contained posteriorly perforated gastric ulcer in setting of taking 50 BC powder packets weekly. She left the hospital [facility] 12/28/2021 and was readmitted with worsening abdominal pain 01/02/2022. She was found to have multiple abdominal abscesses. She underwent  CT guided drainage of hepatic abscess with indwelling drain placement and treated with IV antibiotics. Humira (for hidratenitis) and Xolair (for chronic uticaria) were discontinued. She was discharged home with a PICC line and IV antibiotics were continued for 2 1/2 months (  6 weeks of antibiotics with CTX/Flagyl x 3 weeks then 3 weeks of Augmentin) as managed by ID. Follow-up CT 01/18/2022 showed significant improvement in all of the fluid collections. Repeat CT 04/27/2022 was stable.   At the time of her office visit with Dr. Ardis Hughs, she was not taking any PPI. She was prescribed Omeprazole 25m po bid and an EGD was ordered but was not done.   She presents today to schedule an EGD. She has night time heartburn which somewhat improved after sleeping with the head of the bed elevated. She stated taking Pantoprazole (not Omeprazole) 465mpo bid. She has intermittent upper and lower abdominal pain. No abdominal pain at this time. No N/V. She is passing a normal formed brown stool most days. No obvious rectal bleeding or black stools. She doesn't usually look at her bowel movements. [place] due to having "colitis" symptoms. Brother with  history of Crohn's disease.   She was found to have a 14 x 9 mm right lung cavitary lesion during her 12/2021 hospital admission. She was seen by Dr. IcValeta Harms1/09/2201 to further the right lung lesion which he suspects is malignant. She was referred to cardiothoracic surgery and she was scheduled for a PET scan 06/29/2022.  She has lower  back pain for which she takes Oxycodone tid.       Latest Ref Rng & Units 04/27/2022    1:05 AM 01/05/2022    1:42 AM 01/04/2022    4:09 AM  CBC  WBC 4.0 - 10.5 K/uL 6.4  8.3  8.0   Hemoglobin 12.0 - 15.0 g/dL 13.8  11.2  10.9   Hematocrit 36.0 - 46.0 % 41.7  34.5  32.7   Platelets 150 - 400 K/uL 221  451  454        Latest Ref Rng & Units 04/27/2022    1:05 AM 01/05/2022    1:42 AM 01/04/2022    4:09 AM  CMP  Glucose 70 - 99 mg/dL 85  93  97   BUN 6 - 20 mg/dL 12  <5  <5   Creatinine 0.44 - 1.00 mg/dL 0.82  0.63  0.58   Sodium 135 - 145 mmol/L 142  135  137   Potassium 3.5 - 5.1 mmol/L 3.2  3.9  3.7   Chloride 98 - 111 mmol/L 104  104  106   CO2 22 - 32 mmol/L _0 Calcium 8.9 - 10.3 mg/dL 9.4  8.2  8.0   Total Protein 6.5 - 8.1 g/dL 6.8     Total Bilirubin 0.3 - 1.2 mg/dL 0.2     Alkaline Phos 38 - 126 U/L 82     AST 15 - 41 U/L 23     ALT 0 - 44 U/L 23        ECHO 12/27/2021: IMPRESSIONS Left ventricular ejection fraction, by estimation, is 60 to 65%. The left ventricle has normal function. The left ventricle has no regional wall motion abnormalities. Left ventricular diastolic parameters were normal. 1. Right ventricular systolic function is normal. The right ventricular size is normal. There is normal pulmonary artery systolic pressure. 2. The mitral valve is normal in structure. No evidence of mitral valve regurgitation. No evidence of mitral stenosis. 3. The aortic valve is tricuspid. Aortic valve regurgitation is not visualized. No aortic stenosis is present. 4. The inferior vena cava is normal in size with greater than  50% respiratory variability, suggesting right atrial pressure of 3 mmHg. 5. 6. Cannot exclude a small PFO.  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  Review of Systems:   Constitutional: Negative for fever, sweats, chills or weight loss.  Respiratory: Negative for shortness of breath.   Cardiovascular: Negative for chest pain, palpitations and leg swelling.  Gastrointestinal: See HPI.  Musculoskeletal: + Back pain.  Neurological: Negative for dizziness, headaches or paresthesias.   Physical Exam: BP (!) 122/90   Pulse 93   Ht _0  (1.651 m)   Wt 174 lb 2 oz (79 kg)   BMI 28.98 kg/m   General: 46 year old female in NAD.  Head: Normocephalic and atraumatic. Eyes: No scleral icterus. Conjunctiva pink . Ears: Normal auditory acuity. Mouth: Dentition intact. No ulcers or lesions.  Lungs: Clear throughout to auscultation. Heart: Regular rate and rhythm, no murmur. Abdomen: Soft, nontender and nondistended. No masses or hepatomegaly. Normal bowel sounds x 4 quadrants.  Rectal: Deferred.  Musculoskeletal: Symmetrical with no gross deformities. Extremities: No edema. Neurological: Alert oriented x 4. No focal deficits.  Psychological: Alert and cooperative. Normal mood and affect  Assessment and Recommendations:  40) 46 year old female with a history of presumed  posteriorly perforated gastric ulcer from excessive NSAID use 12/2021 resulting in multiple abdominal abscesses s/p drain placement per IR and 2 1/2 months of IV antibiotics per ID. Most recent CTAP 04/27/2022 without evidence of residual liver abscess. No significant abdominal pain.   -EGD benefits and risks discussed including risk with sedation, risk of bleeding, perforation and infection  -Continue Pantoprazole 4m po ibd -No NSAIDs -GERD diet  -Further recommendations to be determined after EGD completed -Patient to go to the ED if she develops severe abdominal pain   2) Colon cancer screening. Patient reported  having a colonoscopy 9 or 10 years ago, no polyps.  -Colonoscopy benefits and risks discussed including risk with sedation, risk of bleeding, perforation and infection   3) Right ovarian cyst measuring 3.6 x 2.5cm per CTAP 04/27/2022 -Patient to continue follow up with gyn  4) Right lung lesion, suspect malignant process per Dr. Valeta Harms -Patient to proceed with cardiothoracic evaluation and PET scan   I will consult with Dr. Havery Moros to coordinate the timing of her EGD/colonoscopy as she is concurrently undergoing evaluation for a right lung lesion as noted above.    ADDENDUM: I sent Dr. Valeta Harms a staff message to verify if patient is appropriate to proceed with EGD/colonoscopy as planned in the setting of pulmonary lesion work up. Patient ok to proceed with EGD/colonoscopy with MAC 07/18/2022 per pulmonologist Dr. Valeta Harms.

## 2022-06-14 NOTE — Patient Instructions (Signed)
Thank you for visiting Dr. Valeta Harms at [facility]. Today we recommend the following:  Orders Placed This Encounter  Procedures   NM PET Image Initial (PI) Skull Base To Thigh (F-18 FDG)   Ambulatory referral to Cardiothoracic Surgery   Pulmonary Function Test   Appt to see Dr. Kipp Brood after your PET and PFTs complete  Return in about 3 months (around 09/14/2022) for with APP or Dr. Valeta Harms.    Please do your part to reduce the spread of COVID-19.

## 2022-06-15 ENCOUNTER — Ambulatory Visit (INDEPENDENT_AMBULATORY_CARE_PROVIDER_SITE_OTHER): Payer: Medicare Other | Admitting: Nurse Practitioner

## 2022-06-15 ENCOUNTER — Encounter: Payer: Self-pay | Admitting: Nurse Practitioner

## 2022-06-15 VITALS — BP 122/90 | HR 93 | Ht 65.0 in | Wt 174.1 lb

## 2022-06-15 DIAGNOSIS — R1030 Lower abdominal pain, unspecified: Secondary | ICD-10-CM

## 2022-06-15 DIAGNOSIS — R1010 Upper abdominal pain, unspecified: Secondary | ICD-10-CM

## 2022-06-15 MED ORDER — NA SULFATE-K SULFATE-MG SULF 17.5-3.13-1.6 GM/177ML PO SOLN: 1.0000 | Freq: Once | ORAL | 0 refills | Status: AC

## 2022-06-15 NOTE — Patient Instructions (Addendum)
You have been scheduled for an endoscopy and colonoscopy. Please follow the written instructions given to you at your visit today. Please pick up your prep supplies at the pharmacy within the next 1-3 days. If you use inhalers (even only as needed), please bring them with you on the day of your procedure.   Continue Pantoprazole 40 mg twice daily.  It was a pleasure to see you today!  Thank you for trusting me with your gastrointestinal care!

## 2022-06-18 NOTE — Progress Notes (Signed)
Agree with assessment and plan as outlined.  

## 2022-06-29 ENCOUNTER — Encounter (HOSPITAL_COMMUNITY)
Admission: RE | Admit: 2022-06-29 | Discharge: 2022-06-29 | Disposition: A | Discharge status: Home / Self Care | Payer: Medicare Other | Source: Ambulatory Visit | Attending: Pulmonary Disease | Admitting: Pulmonary Disease

## 2022-06-29 DIAGNOSIS — R911 Solitary pulmonary nodule: Secondary | ICD-10-CM | POA: Diagnosis present

## 2022-06-29 DIAGNOSIS — M5136 Other intervertebral disc degeneration, lumbar region: Secondary | ICD-10-CM | POA: Diagnosis present

## 2022-06-29 DIAGNOSIS — R918 Other nonspecific abnormal finding of lung field: Secondary | ICD-10-CM | POA: Insufficient documentation

## 2022-06-29 LAB — GLUCOSE, CAPILLARY: Glucose-Capillary: 98 mg/dL (ref 70–99)

## 2022-06-29 MED ORDER — FLUDEOXYGLUCOSE F - 18 (FDG) INJECTION
9.4000 | Freq: Once | INTRAVENOUS | Status: AC
  Administered 2022-06-29: 8.65 via INTRAVENOUS

## 2022-07-02 ENCOUNTER — Encounter: Payer: Self-pay | Admitting: Pulmonary Disease

## 2022-07-04 NOTE — Progress Notes (Signed)
HL, Here is her PET. You are seeing her on the 7th. PFTs are scheduled for the 1st.   Thanks,  BLI  Garner Nash, DO  Pulmonary Critical Care 07/04/2022 3:48 PM

## 2022-07-09 ENCOUNTER — Encounter: Payer: Self-pay | Admitting: Gastroenterology

## 2022-07-13 ENCOUNTER — Ambulatory Visit (INDEPENDENT_AMBULATORY_CARE_PROVIDER_SITE_OTHER): Payer: Medicare Other | Admitting: Pulmonary Disease

## 2022-07-13 DIAGNOSIS — R911 Solitary pulmonary nodule: Secondary | ICD-10-CM | POA: Diagnosis not present

## 2022-07-13 LAB — PULMONARY FUNCTION TEST
DL/VA % pred: 117 %
DL/VA: 5.09 ml/min/mmHg/L
DLCO cor % pred: 96 %
DLCO cor: 21.39 ml/min/mmHg
DLCO unc % pred: 99 %
DLCO unc: 22.02 ml/min/mmHg
FEF 25-75 Post: 3.75 L/sec
FEF 25-75 Pre: 3.03 L/sec
FEF2575-%Change-Post: 23 %
FEF2575-%Pred-Post: 124 %
FEF2575-%Pred-Pre: 100 %
FEV1-%Change-Post: 4 %
FEV1-%Pred-Post: 83 %
FEV1-%Pred-Pre: 79 %
FEV1-Post: 2.52 L
FEV1-Pre: 2.41 L
FEV1FVC-%Change-Post: 2 %
FEV1FVC-%Pred-Pre: 106 %
FEV6-%Change-Post: 2 %
FEV6-%Pred-Post: 77 %
FEV6-%Pred-Pre: 75 %
FEV6-Post: 2.84 L
FEV6-Pre: 2.78 L
FEV6FVC-%Pred-Post: 102 %
FEV6FVC-%Pred-Pre: 102 %
FVC-%Change-Post: 2 %
FVC-%Pred-Post: 75 %
FVC-%Pred-Pre: 74 %
FVC-Post: 2.84 L
FVC-Pre: 2.78 L
Post FEV1/FVC ratio: 89 %
Post FEV6/FVC ratio: 100 %
Pre FEV1/FVC ratio: 87 %
Pre FEV6/FVC Ratio: 100 %
RV % pred: 87 %
RV: 1.52 L
TLC % pred: 83 %
TLC: 4.36 L

## 2022-07-13 NOTE — Progress Notes (Signed)
PFT done today. 

## 2022-07-17 NOTE — Progress Notes (Unsigned)
[address]       [place] [address]             [phone]                    Crystal Mora [place] Medical Record #[id number] Date of Birth: 07/19/1976  Referring: Garner Nash, DO Primary Care: Kathreen Devoid, [place] Primary Cardiologist: None  Chief Complaint:    Chief Complaint  Patient presents with   Lung Lesion    Surgical consult, PET Scan 06/29/22/ Chest CT 05/28/22/ PFT's 07/13/22    History of Present Illness:    Crystal Mora 46 y.o. female presents for surgical evaluation of a 24 cm right lower lobe cavitary this was found incidentally in May 2023.  She was treated for liver abscess.  Subsequent imaging has shown persistent in this nodule.  She also underwent a PET/CT which showed minimal uptake.  She denies any respiratory symptoms.  She continues to vape and use cigars.  She denies any weight changes or neurologic symptoms.      Zubrod Score: At the time of surgery this patient's most appropriate activity status/level should be described as: [x]     0    Normal activity, no symptoms []     1    Restricted in physical strenuous activity but ambulatory, able to do out light work []     2    Ambulatory and capable of self care, unable to do work activities, up and about               >50 % of waking hours                              []     3    Only limited self care, in bed greater than 50% of waking hours []     4    Completely disabled, no self care, confined to bed or chair []     5    Moribund   Past Medical History:  Diagnosis Date   Anxiety    Bipolar 1 disorder ([place])    Chronic urticaria    Depression    Diabetes mellitus without complication ([place])    Hypertension    states she has not took BP medication for 2 years   Insomnia    Neuromuscular disorder ([place])    back   Thyroid disease     Past Surgical History:  Procedure Laterality Date   CESAREAN SECTION     IR RADIOLOGIST EVAL & MGMT  01/18/2022    THYROID SURGERY     TUBAL LIGATION     tubal reversal Bilateral     Family History  Problem Relation Age of Onset   Depression Mother    Mental illness Mother    Asthma Mother    COPD Mother    Diabetes Mother    Mental illness Brother    Mental illness Daughter    Diabetes Maternal Aunt    Mental illness Maternal Aunt    Depression Maternal Aunt    Diabetes Maternal Grandmother      Social History   Tobacco Use  Smoking Status Every Day   Packs/day: 0.50   Types: Cigars, Cigarettes  Smokeless Tobacco Never  Tobacco Comments   Smokes 1-2 black and Mild every other day and vape every day. 06/14/2022 [place]  History   Substance and Sexual Activity  Alcohol Use Not Currently   Comment: Rare     Allergies  Allergen Reactions   Biaxin [Clarithromycin]    Morphine And Related Hives    Says it is okay with benadryl administration   Percocet [Oxycodone-Acetaminophen] Hives and Itching   Shrimp [Shellfish Allergy]    Citalopram Anxiety    aggression   Topiramate Rash    Current Outpatient Medications  Medication Sig Dispense Refill   amLODipine (NORVASC) 10 MG tablet Take 1 tablet by mouth daily.     Aspirin 81 MG CAPS Take by mouth.     cetirizine (ZYRTEC) 10 MG tablet Take 10 mg by mouth daily.     diazepam (VALIUM) 5 MG tablet Take 5 mg by mouth in the morning, at noon, and at bedtime.     EPINEPHrine (EPIPEN 2-PAK) 0.3 mg/0.3 mL IJ SOAJ injection Inject 0.3 mLs (0.3 mg total) into the muscle once as needed for up to 1 dose (for severe allergic reaction). CAll 911 immediately if you have to use this medicine 1 Device 1   gabapentin (NEURONTIN) 300 MG capsule Take 300 mg by mouth daily as needed (For feet pain).     HYDROcodone-acetaminophen (NORCO/VICODIN) 5-325 MG tablet Take 0.5-1 tablets by mouth 3 (three) times daily as needed. 7.5 mg per patient     hydrOXYzine (ATARAX/VISTARIL) 25 MG tablet Take 1 every 4-6 hours as needed for itch or hives. (Caution:  May cause drowsiness) 60 tablet 0   methocarbamol (ROBAXIN) 500 MG tablet Take 1 tablet (500 mg total) by mouth 2 (two) times daily. 20 tablet 0   pantoprazole (PROTONIX) 40 MG tablet Take 1 tablet (40 mg total) by mouth 2 (two) times daily. 60 tablet 0   RESTASIS 0.05 % ophthalmic emulsion Place 1 drop into both eyes daily as needed for dry eyes.     rosuvastatin (CRESTOR) 5 MG tablet Take 5 mg by mouth daily.     sucralfate (CARAFATE) 1 GM/10ML suspension Take 10 mLs (1 g total) by mouth 4 (four) times daily. 420 mL 1   VITAMIN D PO Take 1 tablet by mouth daily.     No current facility-administered medications for this visit.    Review of Systems  Constitutional:  Positive for malaise/fatigue. Negative for fever and weight loss.  Respiratory:  Negative for shortness of breath.   Cardiovascular:  Negative for chest pain.  Gastrointestinal:  Positive for heartburn.  Neurological: Negative.   Psychiatric/Behavioral:  The patient is nervous/anxious.      PHYSICAL EXAMINATION: BP 135/86   Pulse 87   Resp 20   Ht 5\' 5"  (1.651 m)   Wt 174 lb (78.9 kg)   SpO2 97% Comment: RA  BMI 28.96 kg/m  Physical Exam Constitutional:      General: She is not in acute distress.    Appearance: Normal appearance. She is normal weight. She is not ill-appearing.  HENT:     Head: Normocephalic and atraumatic.  Eyes:     Extraocular Movements: Extraocular movements intact.  Cardiovascular:     Rate and Rhythm: Normal rate.  Pulmonary:     Effort: Pulmonary effort is normal. No respiratory distress.  Abdominal:     General: Abdomen is flat. There is no distension.  Musculoskeletal:        General: Normal range of motion.     Cervical back: Normal range of motion.  Skin:    General: Skin is warm and dry.  Neurological:     General: No focal deficit present.     Mental Status: She is alert and oriented to person, place, and time.     Diagnostic Studies & Laboratory data:     Recent  Radiology Findings:   NM PET Image Initial (PI) Skull Base To Thigh (F-18 FDG)  Result Date: 07/01/2022 CLINICAL DATA:  Initial treatment strategy for pulmonary nodule. EXAM: NUCLEAR MEDICINE PET SKULL BASE TO THIGH TECHNIQUE: 8.7 mCi F-18 FDG was injected intravenously. Full-ring PET imaging was performed from the skull base to thigh after the radiotracer. CT data was obtained and used for attenuation correction and anatomic localization. Fasting blood glucose: 98 mg/dl COMPARISON:  CT 05/28/2022, 01/02/2022 FINDINGS: Mediastinal blood pool activity: SUV max 1.6 Liver activity: SUV max NA NECK: No hypermetabolic lymph nodes in the neck. Incidental CT findings: LEFT hemithyroidectomy. CHEST: The nodule of concern in the RIGHT lower lobe measures 13 mm (image 35/series 7). Nodule is subsolid with central lucency. The size and appearance is similar to CT 01/02/2022. The nodule has low metabolic activity with SUV max equal 2.1. No hypermetabolic mediastinal lymph nodes. There bilateral mildly hypermetabolic axillary lymph nodes with normal morphology. For example LEFT axillary lymph node on image 40 with SUV max equal 2.9 and measuring 6 mm short axis. The nodules have benign fatty hila morphology. Incidental CT findings: None. ABDOMEN/PELVIS: No abnormal hypermetabolic activity within the liver, pancreas, adrenal glands, or spleen. No hypermetabolic lymph nodes in the abdomen or pelvis. Incidental CT findings: Uterus and adnexa unremarkable. SKELETON: No focal hypermetabolic activity to suggest skeletal metastasis. Incidental CT findings: None. IMPRESSION: 1. Low metabolic activity a persistent sub solid nodule in the RIGHT lower lobe. Nodule remains indeterminate with differential including low-grade adenocarcinoma versus persistent focus of pulmonary infection. 2. No metastatic lymphadenopathy. 3. Prominent axillary lymph nodes are favored benign reactive. 4. Electronically Signed   By: Suzy Bouchard M.D.    On: 07/01/2022 13:23       I have independently reviewed the above radiology studies  and reviewed the findings with the patient.   Recent Lab Findings: Lab Results  Component Value Date   WBC 6.4 04/27/2022   HGB 13.8 04/27/2022   HCT 41.7 04/27/2022   PLT 221 04/27/2022   GLUCOSE 85 04/27/2022   ALT 23 04/27/2022   AST 23 04/27/2022   NA 142 04/27/2022   K 3.2 (L) 04/27/2022   CL 104 04/27/2022   CREATININE 0.82 04/27/2022   BUN 12 04/27/2022   CO2 26 04/27/2022   TSH 0.393 01/02/2022   HGBA1C 6.3 (H) 01/02/2022     PFTs:  - FVC: 74% - FEV1: 79% -DLCO: 96%        Assessment / Plan:   46 year old female with a 1.4 cm right lower lobe cavitary pulmonary nodule concerning for small cell lung cancer.  We discussed the importance of smoking cessation, and she has agreed to stop.  We also covered the risks and benefits of navigational bronchoscopy followed a right robotic assisted thoracoscopy with wedge resection and possible right lower lobectomy as a single anesthetic event.  She would like to proceed, and I will coordinate timing with Dr. Valeta Harms.  She will require a stress test prior to surgery   I  spent 55 minutes with  the patient face to face in counseling and coordination of care.    Lajuana Matte 07/19/2022 9:50 AM

## 2022-07-17 NOTE — H&P (View-Only) (Signed)
[address]       [place] [address]             [phone]                    Crystal Mora [place] Medical Record #[id number] Date of Birth: [birth year]  Referring: Garner Nash, DO Primary Care: Kathreen Devoid, [place] Primary Cardiologist: None  Chief Complaint:    Chief Complaint  Patient presents with   Lung Lesion    Surgical consult, PET Scan 06/29/22/ Chest CT 05/28/22/ PFT's 07/13/22    History of Present Illness:    Crystal Mora 46 y.o. female presents for surgical evaluation of a 24 cm right lower lobe cavitary this was found incidentally in May 2023.  She was treated for liver abscess.  Subsequent imaging has shown persistent in this nodule.  She also underwent a PET/CT which showed minimal uptake.  She denies any respiratory symptoms.  She continues to vape and use cigars.  She denies any weight changes or neurologic symptoms.      Zubrod Score: At the time of surgery this patient's most appropriate activity status/level should be described as: [x]     0    Normal activity, no symptoms []     1    Restricted in physical strenuous activity but ambulatory, able to do out light work []     2    Ambulatory and capable of self care, unable to do work activities, up and about               >50 % of waking hours                              []     3    Only limited self care, in bed greater than 50% of waking hours []     4    Completely disabled, no self care, confined to bed or chair []     5    Moribund   Past Medical History:  Diagnosis Date   Anxiety    Bipolar 1 disorder ([place])    Chronic urticaria    Depression    Diabetes mellitus without complication ([place])    Hypertension    states she has not took BP medication for 2 years   Insomnia    Neuromuscular disorder ([place])    back   Thyroid disease     Past Surgical History:  Procedure Laterality Date   CESAREAN SECTION     IR RADIOLOGIST EVAL & MGMT  01/18/2022    THYROID SURGERY     TUBAL LIGATION     tubal reversal Bilateral     Family History  Problem Relation Age of Onset   Depression Mother    Mental illness Mother    Asthma Mother    COPD Mother    Diabetes Mother    Mental illness Brother    Mental illness Daughter    Diabetes Maternal Aunt    Mental illness Maternal Aunt    Depression Maternal Aunt    Diabetes Maternal Grandmother      Social History   Tobacco Use  Smoking Status Every Day   Packs/day: 0.50   Types: Cigars, Cigarettes  Smokeless Tobacco Never  Tobacco Comments   Smokes 1-2 black and Mild every other day and vape every day. 06/14/2022 [place]  History   Substance and Sexual Activity  Alcohol Use Not Currently   Comment: Rare     Allergies  Allergen Reactions   Biaxin [Clarithromycin]    Morphine And Related Hives    Says it is okay with benadryl administration   Percocet [Oxycodone-Acetaminophen] Hives and Itching   Shrimp [Shellfish Allergy]    Citalopram Anxiety    aggression   Topiramate Rash    Current Outpatient Medications  Medication Sig Dispense Refill   amLODipine (NORVASC) 10 MG tablet Take 1 tablet by mouth daily.     Aspirin 81 MG CAPS Take by mouth.     cetirizine (ZYRTEC) 10 MG tablet Take 10 mg by mouth daily.     diazepam (VALIUM) 5 MG tablet Take 5 mg by mouth in the morning, at noon, and at bedtime.     EPINEPHrine (EPIPEN 2-PAK) 0.3 mg/0.3 mL IJ SOAJ injection Inject 0.3 mLs (0.3 mg total) into the muscle once as needed for up to 1 dose (for severe allergic reaction). CAll 911 immediately if you have to use this medicine 1 Device 1   gabapentin (NEURONTIN) 300 MG capsule Take 300 mg by mouth daily as needed (For feet pain).     HYDROcodone-acetaminophen (NORCO/VICODIN) 5-325 MG tablet Take 0.5-1 tablets by mouth 3 (three) times daily as needed. 7.5 mg per patient     hydrOXYzine (ATARAX/VISTARIL) 25 MG tablet Take 1 every 4-6 hours as needed for itch or hives. (Caution:  May cause drowsiness) 60 tablet 0   methocarbamol (ROBAXIN) 500 MG tablet Take 1 tablet (500 mg total) by mouth 2 (two) times daily. 20 tablet 0   pantoprazole (PROTONIX) 40 MG tablet Take 1 tablet (40 mg total) by mouth 2 (two) times daily. 60 tablet 0   RESTASIS 0.05 % ophthalmic emulsion Place 1 drop into both eyes daily as needed for dry eyes.     rosuvastatin (CRESTOR) 5 MG tablet Take 5 mg by mouth daily.     sucralfate (CARAFATE) 1 GM/10ML suspension Take 10 mLs (1 g total) by mouth 4 (four) times daily. 420 mL 1   VITAMIN D PO Take 1 tablet by mouth daily.     No current facility-administered medications for this visit.    Review of Systems  Constitutional:  Positive for malaise/fatigue. Negative for fever and weight loss.  Respiratory:  Negative for shortness of breath.   Cardiovascular:  Negative for chest pain.  Gastrointestinal:  Positive for heartburn.  Neurological: Negative.   Psychiatric/Behavioral:  The patient is nervous/anxious.      PHYSICAL EXAMINATION: BP 135/86   Pulse 87   Resp 20   Ht 5\' 5"  (1.651 m)   Wt 174 lb (78.9 kg)   SpO2 97% Comment: RA  BMI 28.96 kg/m  Physical Exam Constitutional:      General: She is not in acute distress.    Appearance: Normal appearance. She is normal weight. She is not ill-appearing.  HENT:     Head: Normocephalic and atraumatic.  Eyes:     Extraocular Movements: Extraocular movements intact.  Cardiovascular:     Rate and Rhythm: Normal rate.  Pulmonary:     Effort: Pulmonary effort is normal. No respiratory distress.  Abdominal:     General: Abdomen is flat. There is no distension.  Musculoskeletal:        General: Normal range of motion.     Cervical back: Normal range of motion.  Skin:    General: Skin is warm and dry.  Neurological:     General: No focal deficit present.     Mental Status: She is alert and oriented to person, place, and time.     Diagnostic Studies & Laboratory data:     Recent  Radiology Findings:   NM PET Image Initial (PI) Skull Base To Thigh (F-18 FDG)  Result Date: 07/01/2022 CLINICAL DATA:  Initial treatment strategy for pulmonary nodule. EXAM: NUCLEAR MEDICINE PET SKULL BASE TO THIGH TECHNIQUE: 8.7 mCi F-18 FDG was injected intravenously. Full-ring PET imaging was performed from the skull base to thigh after the radiotracer. CT data was obtained and used for attenuation correction and anatomic localization. Fasting blood glucose: 98 mg/dl COMPARISON:  CT 05/28/2022, 01/02/2022 FINDINGS: Mediastinal blood pool activity: SUV max 1.6 Liver activity: SUV max NA NECK: No hypermetabolic lymph nodes in the neck. Incidental CT findings: LEFT hemithyroidectomy. CHEST: The nodule of concern in the RIGHT lower lobe measures 13 mm (image 35/series 7). Nodule is subsolid with central lucency. The size and appearance is similar to CT 01/02/2022. The nodule has low metabolic activity with SUV max equal 2.1. No hypermetabolic mediastinal lymph nodes. There bilateral mildly hypermetabolic axillary lymph nodes with normal morphology. For example LEFT axillary lymph node on image 40 with SUV max equal 2.9 and measuring 6 mm short axis. The nodules have benign fatty hila morphology. Incidental CT findings: None. ABDOMEN/PELVIS: No abnormal hypermetabolic activity within the liver, pancreas, adrenal glands, or spleen. No hypermetabolic lymph nodes in the abdomen or pelvis. Incidental CT findings: Uterus and adnexa unremarkable. SKELETON: No focal hypermetabolic activity to suggest skeletal metastasis. Incidental CT findings: None. IMPRESSION: 1. Low metabolic activity a persistent sub solid nodule in the RIGHT lower lobe. Nodule remains indeterminate with differential including low-grade adenocarcinoma versus persistent focus of pulmonary infection. 2. No metastatic lymphadenopathy. 3. Prominent axillary lymph nodes are favored benign reactive. 4. Electronically Signed   By: Suzy Bouchard M.D.    On: 07/01/2022 13:23       I have independently reviewed the above radiology studies  and reviewed the findings with the patient.   Recent Lab Findings: Lab Results  Component Value Date   WBC 6.4 04/27/2022   HGB 13.8 04/27/2022   HCT 41.7 04/27/2022   PLT 221 04/27/2022   GLUCOSE 85 04/27/2022   ALT 23 04/27/2022   AST 23 04/27/2022   NA 142 04/27/2022   K 3.2 (L) 04/27/2022   CL 104 04/27/2022   CREATININE 0.82 04/27/2022   BUN 12 04/27/2022   CO2 26 04/27/2022   TSH 0.393 01/02/2022   HGBA1C 6.3 (H) 01/02/2022     PFTs:  - FVC: 74% - FEV1: 79% -DLCO: 96%        Assessment / Plan:   46 year old female with a 1.4 cm right lower lobe cavitary pulmonary nodule concerning for small cell lung cancer.  We discussed the importance of smoking cessation, and she has agreed to stop.  We also covered the risks and benefits of navigational bronchoscopy followed a right robotic assisted thoracoscopy with wedge resection and possible right lower lobectomy as a single anesthetic event.  She would like to proceed, and I will coordinate timing with Dr. Valeta Harms.  She will require a stress test prior to surgery   I  spent 55 minutes with  the patient face to face in counseling and coordination of care.    Lajuana Matte 07/19/2022 9:50 AM

## 2022-07-18 ENCOUNTER — Ambulatory Visit (AMBULATORY_SURGERY_CENTER): Payer: Medicare Other | Admitting: Gastroenterology

## 2022-07-18 ENCOUNTER — Encounter: Payer: Self-pay | Admitting: Gastroenterology

## 2022-07-18 VITALS — BP 133/70 | HR 89 | Temp 98.2°F | Resp 13 | Ht 65.0 in | Wt 174.0 lb

## 2022-07-18 DIAGNOSIS — Z8711 Personal history of peptic ulcer disease: Secondary | ICD-10-CM | POA: Diagnosis not present

## 2022-07-18 DIAGNOSIS — K649 Unspecified hemorrhoids: Secondary | ICD-10-CM

## 2022-07-18 DIAGNOSIS — D509 Iron deficiency anemia, unspecified: Secondary | ICD-10-CM | POA: Diagnosis not present

## 2022-07-18 DIAGNOSIS — K2970 Gastritis, unspecified, without bleeding: Secondary | ICD-10-CM

## 2022-07-18 DIAGNOSIS — K635 Polyp of colon: Secondary | ICD-10-CM

## 2022-07-18 DIAGNOSIS — D125 Benign neoplasm of sigmoid colon: Secondary | ICD-10-CM

## 2022-07-18 DIAGNOSIS — Z1211 Encounter for screening for malignant neoplasm of colon: Secondary | ICD-10-CM

## 2022-07-18 DIAGNOSIS — K319 Disease of stomach and duodenum, unspecified: Secondary | ICD-10-CM | POA: Diagnosis not present

## 2022-07-18 DIAGNOSIS — K5730 Diverticulosis of large intestine without perforation or abscess without bleeding: Secondary | ICD-10-CM | POA: Diagnosis not present

## 2022-07-18 DIAGNOSIS — R1010 Upper abdominal pain, unspecified: Secondary | ICD-10-CM

## 2022-07-18 MED ORDER — SODIUM CHLORIDE 0.9 % IV SOLN: 500.0000 mL | INTRAVENOUS | Status: DC

## 2022-07-18 MED ORDER — PANTOPRAZOLE SODIUM 40 MG PO TBEC: 40.0000 mg | DELAYED_RELEASE_TABLET | Freq: Two times a day (BID) | ORAL | 0 refills | Status: DC

## 2022-07-18 MED ORDER — SUCRALFATE 1 GM/10ML PO SUSP: 1.0000 g | Freq: Four times a day (QID) | ORAL | 1 refills | Status: AC

## 2022-07-18 NOTE — Progress Notes (Signed)
[facility] History and Physical   Primary Care Physician:  Kathreen Devoid, PA-C   Reason for Procedure:   History of gastric ulcer, history of IDA, CRC screening  Plan:    EGD and colonoscopy     HPI: Crystal Mora is a 46 y.o. female  here for EGD and colonoscopy as outlined above to evaluate history of perforated gastric ulcer leading to surgery / drain placement in May 2023. Also with reported history of IDA followed by hematology on IV iron in the past. Last colonoscopy was 10 years ago. She has intermittent abdominal pains that bother her, but overall better than previous. Had been on protonix until October and then ran out.    Patient denies any bowel symptoms at this time. No family history of colon cancer known. Otherwise feels well without any cardiopulmonary symptoms.   I have discussed risks / benefits of anesthesia and endoscopic procedure with [name] and they wish to proceed with the exams as outlined today.    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 Authorizing Provider  amLODipine (NORVASC) 10 MG tablet Take 1 tablet by mouth daily.   Yes [provider]  Aspirin 81 MG CAPS Take by mouth.   Yes [provider]  cetirizine (ZYRTEC) 10 MG tablet Take 10 mg by mouth daily.   Yes [provider]  gabapentin (NEURONTIN) 300 MG capsule Take 300 mg by mouth daily as needed (For feet pain).  01/16/21  Yes [provider]  HYDROcodone-acetaminophen (NORCO/VICODIN) 5-325 MG tablet Take 0.5-1 tablets by mouth 3 (three) times daily as needed. 7.5 mg per patient 04/19/22  Yes [provider]  hydrOXYzine (ATARAX/VISTARIL) 25 MG tablet Take 1 every 4-6 hours as needed for itch or hives. (Caution: May cause drowsiness) 02/27/20  Yes Jacqulyn Cane, MD  rosuvastatin (CRESTOR) 5 MG tablet Take 5 mg by mouth daily. 10/13/21  Yes [provider]  VITAMIN D PO Take 1 tablet by mouth daily.   Yes [provider]  diazepam (VALIUM) 5 MG tablet Take 5 mg by mouth in the morning, at noon, and at bedtime.    [provider]  EPINEPHrine (EPIPEN 2-PAK) 0.3 mg/0.3 mL IJ SOAJ injection Inject 0.3 mLs (0.3 mg total) into the muscle once as needed for up to 1 dose (for severe allergic reaction). CAll 911 immediately if you have to use this medicine 09/24/18   Larene Pickett, PA-C  methocarbamol (ROBAXIN) 500 MG tablet Take 1 tablet (500 mg total) by mouth 2 (two) times daily. Patient not taking: Reported on 06/15/2022 12/16/21   Blue, Soijett A, PA-C  pantoprazole (PROTONIX) 40 MG tablet Take 1 tablet (40 mg total) by mouth 2 (two) times daily. Patient not taking: Reported on 05/31/2022 04/27/22 05/27/22  Marijean Bravo,  Audery Amel, PA-C  RESTASIS 0.05 % ophthalmic emulsion Place 1 drop into both eyes daily as needed for dry eyes. 12/20/21   [provider]  sucralfate (CARAFATE) 1 GM/10ML suspension Take 10 mLs (1 g total) by mouth 4 (four) times daily -  with meals and at bedtime. Patient not taking: Reported on 07/18/2022 04/27/22   Jacqlyn Larsen, PA-C    Current Outpatient Medications  Medication Sig Dispense Refill   amLODipine (NORVASC) 10 MG tablet Take 1 tablet by mouth daily.     Aspirin 81 MG CAPS Take by mouth.     cetirizine (ZYRTEC) 10 MG tablet Take 10 mg by mouth daily.     gabapentin (NEURONTIN) 300 MG capsule Take 300 mg by mouth daily as needed (For feet  pain).     HYDROcodone-acetaminophen (NORCO/VICODIN) 5-325 MG tablet Take 0.5-1 tablets by mouth 3 (three) times daily as needed. 7.5 mg per patient     hydrOXYzine (ATARAX/VISTARIL) 25 MG tablet Take 1 every 4-6 hours as needed for itch or hives. (Caution: May cause drowsiness) 60 tablet 0   rosuvastatin (CRESTOR) 5 MG tablet Take 5 mg by mouth daily.     VITAMIN D PO Take 1 tablet by mouth daily.     diazepam (VALIUM) 5 MG tablet Take 5 mg by mouth in the morning, at noon, and at bedtime.     EPINEPHrine (EPIPEN 2-PAK) 0.3 mg/0.3 mL IJ SOAJ injection Inject 0.3 mLs (0.3 mg total) into the muscle once as needed for up to 1 dose (for severe allergic reaction). CAll 911 immediately if you have to use this medicine 1 Device 1   methocarbamol (ROBAXIN) 500 MG tablet Take 1 tablet (500 mg total) by mouth 2 (two) times daily. (Patient not taking: Reported on 06/15/2022) 20 tablet 0   pantoprazole (PROTONIX) 40 MG tablet Take 1 tablet (40 mg total) by mouth 2 (two) times daily. (Patient not taking: Reported on 05/31/2022) 60 tablet 0   RESTASIS 0.05 % ophthalmic emulsion Place 1 drop into both eyes daily as needed for dry eyes.     sucralfate (CARAFATE) 1 GM/10ML suspension Take 10 mLs (1 g total) by mouth 4 (four) times daily -  with meals and at bedtime. (Patient not taking: Reported on 07/18/2022) 420 mL 0   Current Facility-Administered Medications  Medication Dose Route Frequency Provider Last Rate Last Admin   0.9 %  sodium chloride infusion  500 mL Intravenous Continuous Aniza Shor, Carlota Raspberry, MD        Allergies as of 07/18/2022 - Review Complete 07/18/2022  Allergen Reaction Noted   Biaxin [clarithromycin]  12/02/2015   Morphine and related Hives 01/23/2018   Percocet [oxycodone-acetaminophen] Hives and Itching 12/02/2015   Shrimp [shellfish allergy]  10/01/2018   Citalopram Anxiety 04/30/2019   Topiramate Rash 12/09/2018    Family History  Problem Relation Age of Onset   Depression  Mother    Mental illness Mother    Asthma Mother    COPD Mother    Diabetes Mother    Mental illness Brother    Mental illness Daughter    Diabetes Maternal Aunt    Mental illness Maternal Aunt    Depression Maternal Aunt    Diabetes Maternal Grandmother     Social History   Socioeconomic History   Marital status: Divorced    Spouse name: Not on file   Number of children: Not on file   Years of education: Not on file   Highest education  level: Not on file  Occupational History   Not on file  Tobacco Use   Smoking status: Every Day    Packs/day: 0.50    Types: Cigars, Cigarettes   Smokeless tobacco: Never   Tobacco comments:    Smokes 1-2 black and Mild every other day and vape every day. 06/14/2022 Tay  Vaping Use   Vaping Use: Every day   Substances: Flavoring  Substance and Sexual Activity   Alcohol use: Not Currently    Comment: Rare   Drug use: No   Sexual activity: Yes    Birth control/protection: Surgical  Other Topics Concern   Not on file  Social History Narrative   Not on file   Social Determinants of Health   Financial Resource Strain: Not on file  Food Insecurity: Not on file  Transportation Needs: Not on file  Physical Activity: Not on file  Stress: Not on file  Social Connections: Not on file  Intimate Partner Violence: Not on file    Review of Systems: All other review of systems negative except as mentioned in the HPI.  Physical Exam: Vital signs BP 108/68   Pulse 93   Temp 98.2 F (36.8 C)   Ht 5\' 5"  (1.651 m)   Wt 174 lb (78.9 kg)   SpO2 98%   BMI 28.96 kg/m   General:   Alert,  Well-developed, pleasant and cooperative in NAD Lungs:  Clear throughout to auscultation.   Heart:  Regular rate and rhythm Abdomen:  Soft, nontender and nondistended.   Neuro/Psych:  Alert and cooperative. Normal mood and affect. A and O x 3  Jolly Mango, MD [facility]

## 2022-07-18 NOTE — Op Note (Signed)
[facility] Patient Name: Crystal Mora Procedure Date: 07/18/2022 1:57 PM MRN: [id number] Endoscopist: Remo Lipps P. Havery Moros , MD, [id number] Age: 46 Referring MD:  Date of Birth: 07/04/1976 Gender: Female Account #: [id number] Procedure:                Upper GI endoscopy Indications:              history of perforated gatric ulcer with drain                            placement spring 2023, treated with PPI, ran out of                            protonix in October, having intermittent epigastric                            abdominal pain, history of iron deficiency anemia                            followed with Hematology, on IV iron periodically Medicines:                Monitored Anesthesia Care Procedure:                Pre-Anesthesia Assessment:                           - Prior to the procedure, a History and Physical                            was performed, and patient medications and                            allergies were reviewed. The patient's tolerance of                            previous anesthesia was also reviewed. The risks                            and benefits of the procedure and the sedation                            options and risks were discussed with the patient.                            All questions were answered, and informed consent                            was obtained. Prior Anticoagulants: The patient has                            taken no anticoagulant or antiplatelet agents. ASA                            Grade Assessment: III - A patient with severe  systemic disease. After reviewing the risks and                            benefits, the patient was deemed in satisfactory                            condition to undergo the procedure.                           After obtaining informed consent, the endoscope was                            passed under direct vision. Throughout the                             procedure, the patient's blood pressure, pulse, and                            oxygen saturations were monitored continuously. The                            GIF [id number] #[id number] was introduced through the                            mouth, and advanced to the second part of duodenum.                            The upper GI endoscopy was accomplished without                            difficulty. The patient tolerated the procedure                            well. Scope In: Scope Out: Findings:                 Esophagogastric landmarks were identified: the                            Z-line was found at 38 cm, the gastroesophageal                            junction was found at 38 cm and the upper extent of                            the gastric folds was found at 39 cm from the                            incisors.                           The Z-line was slightly irregular and was found 38                            cm from  the incisors, did not meet criteria for                            Barrett's.                           The exam of the esophagus was otherwise normal.                           Diffuse mild to moderate inflammation characterized                            by erosions, erythema, friability and granularity                            was found in the antrum / body. No overt / large                            ulcerations. Biopsies were taken with a cold                            forceps for Helicobacter pylori testing.                           The exam of the stomach was otherwise normal.                           There was some erythema in the duodenal bulb, the                            remainder of the examined duodenum was normal. Complications:            No immediate complications. Estimated blood loss:                            Minimal. Estimated Blood Loss:     Estimated blood loss was minimal. Impression:               - Esophagogastric landmarks  identified.                           - Z-line slightly irregular, 38 cm from the                            incisors, did not meet criteria for Barrett's.                           - Normal esophagus otherwise.                           - Interval healing of prior ulcer but gastritis                            present. Biopsied.                           -  Normal stomach otherwise                           - Duodenal bulb erythema, otherwise normal examined                            duodenum.                           Gastritis / history of PUD likely contributing to                            iron deficiency and could be related to epigastric                            discomfort. Recommendation:           - Patient has a contact number available for                            emergencies. The signs and symptoms of potential                            delayed complications were discussed with the                            patient. Return to normal activities tomorrow.                            Written discharge instructions were provided to the                            patient.                           - Resume previous diet.                           - Continue present medications.                           - Resume protonix 40mg  twice daily                           - Start carafate 1 gram tablet every 6 hours as                            needed for discomfort                           - Avoid all NSAIDs                           - Await pathology results. Remo Lipps P. Zakyla Tonche, MD 07/18/2022 2:36:05 PM This report has been signed electronically.

## 2022-07-18 NOTE — Progress Notes (Signed)
Called to room to assist during endoscopic procedure.  Patient ID and intended procedure confirmed with present staff. Received instructions for my participation in the procedure from the performing physician.  

## 2022-07-18 NOTE — Progress Notes (Signed)
Report to PACU, RN, vss, BBS= Clear.  

## 2022-07-18 NOTE — Patient Instructions (Addendum)
Read all of the handouts given to you by your recovery room nurse.  Resume all of your previous medications as ordered.  Take your protonix and carafate as ordered.  Do not take NSAIDS: ibuprofen, aspirin, aleve. They could increase the redness in your stomach.  YOU HAD AN ENDOSCOPIC PROCEDURE TODAY AT [facility]:   Refer to the procedure report that was given to you for any specific questions about what was found during the examination.  If the procedure report does not answer your questions, please call your gastroenterologist to clarify.  If you requested that your care partner not be given the details of your procedure findings, then the procedure report has been included in a sealed envelope for you to review at your convenience later.  YOU SHOULD EXPECT: Some feelings of bloating in the abdomen. Passage of more gas than usual.  Walking can help get rid of the air that was put into your GI tract during the procedure and reduce the bloating. If you had a lower endoscopy (such as a colonoscopy or flexible sigmoidoscopy) you may notice spotting of blood in your stool or on the toilet paper. If you underwent a bowel prep for your procedure, you may not have a normal bowel movement for a few days.  Please Note:  You might notice some irritation and congestion in your nose or some drainage.  This is from the oxygen used during your procedure.  There is no need for concern and it should clear up in a day or so.  SYMPTOMS TO REPORT IMMEDIATELY:  Following lower endoscopy (colonoscopy or flexible sigmoidoscopy):  Excessive amounts of blood in the stool  Significant tenderness or worsening of abdominal pains  Swelling of the abdomen that is new, acute  Fever of 100F or higher  Following upper endoscopy (EGD)  Vomiting of blood or coffee ground material  New chest pain or pain under the shoulder blades  Painful or persistently difficult swallowing  New shortness of breath  Fever of  100F or higher  Black, tarry-looking stools  For urgent or emergent issues, a gastroenterologist can be reached at any hour by calling [phone]. Do not use MyChart messaging for urgent concerns.    DIET:  We do recommend a small meal at first, but then you may proceed to your regular diet.  Drink plenty of fluids but you should avoid alcoholic beverages for 24 hours. Try to increase the fiber in your diet, and drink plenty of water.  ACTIVITY:  You should plan to take it easy for the rest of today and you should NOT DRIVE or use heavy machinery until tomorrow (because of the sedation medicines used during the test).    FOLLOW UP: Our staff will call the number listed on your records the next business day following your procedure.  We will call around 7:15- 8:00 am to check on you and address any questions or concerns that you may have regarding the information given to you following your procedure. If we do not reach you, we will leave a message.     If any biopsies were taken you will be contacted by phone or by letter within the next 1-3 weeks.  Please call us at [phone] if you have not heard about the biopsies in 3 weeks.    SIGNATURES/CONFIDENTIALITY: You and/or your care partner have signed paperwork which will be entered into your electronic medical record.  These signatures attest to the fact that that the  information above on your After Visit Summary has been reviewed and is understood.  Full responsibility of the confidentiality of this discharge information lies with you and/or your care-partner.

## 2022-07-18 NOTE — Op Note (Signed)
[facility] Patient Name: Crystal Mora Procedure Date: 07/18/2022 1:57 PM MRN: [id number] Endoscopist: Remo Lipps P. Havery Moros , MD, [id number] Age: 46 Referring MD:  Date of Birth: May 23, 1976 Gender: Female Account #: [id number] Procedure:                Colonoscopy Indications:              lower abdominal pain, Iron deficiency anemia                            followed by hematology on IV iron Medicines:                Monitored Anesthesia Care Procedure:                Pre-Anesthesia Assessment:                           - Prior to the procedure, a History and Physical                            was performed, and patient medications and                            allergies were reviewed. The patient's tolerance of                            previous anesthesia was also reviewed. The risks                            and benefits of the procedure and the sedation                            options and risks were discussed with the patient.                            All questions were answered, and informed consent                            was obtained. Prior Anticoagulants: The patient has                            taken no anticoagulant or antiplatelet agents. ASA                            Grade Assessment: III - A patient with severe                            systemic disease. After reviewing the risks and                            benefits, the patient was deemed in satisfactory                            condition to undergo the procedure.  After obtaining informed consent, the colonoscope                            was passed under direct vision. Throughout the                            procedure, the patient's blood pressure, pulse, and                            oxygen saturations were monitored continuously. The                            [id number]-HQ190L Colonoscope was introduced through the                            anus and advanced  to the the terminal ileum, with                            identification of the appendiceal orifice and IC                            valve. The colonoscopy was performed without                            difficulty. The patient tolerated the procedure                            well. The quality of the bowel preparation was                            good. The terminal ileum, ileocecal valve,                            appendiceal orifice, and rectum were photographed. Scope In: 2:09:19 PM Scope Out: 2:24:23 PM Scope Withdrawal Time: 0 hours 10 minutes 27 seconds  Total Procedure Duration: 0 hours 15 minutes 4 seconds  Findings:                 The perianal and digital rectal examinations were                            normal.                           The terminal ileum appeared normal.                           A few small-mouthed diverticula were found in the                            transverse colon.                           A 4 mm polyp was found in the sigmoid colon. The  polyp was sessile. The polyp was removed with a                            cold snare. Resection and retrieval were complete.                           Internal hemorrhoids were found during                            retroflexion. The hemorrhoids were small.                           The exam was otherwise without abnormality. Complications:            No immediate complications. Estimated blood loss:                            Minimal. Estimated Blood Loss:     Estimated blood loss was minimal. Impression:               - The examined portion of the ileum was normal.                           - Diverticulosis in the transverse colon.                           - One 4 mm polyp in the sigmoid colon, removed with                            a cold snare. Resected and retrieved.                           - Internal hemorrhoids.                           - The examination was  otherwise normal.                           No cause for iron deficiency or abdominal pain on                            this exam. Iron deficiency could be due to                            gastritis / history of ulcers / menses. Recommendation:           - Patient has a contact number available for                            emergencies. The signs and symptoms of potential                            delayed complications were discussed with the  patient. Return to normal activities tomorrow.                            Written discharge instructions were provided to the                            patient.                           - Resume previous diet.                           - Continue present medications.                           - Await pathology results with further                            recommendations Remo Lipps P. Havery Moros, MD 07/18/2022 2:29:24 PM This report has been signed electronically.

## 2022-07-18 NOTE — Progress Notes (Signed)
No changes to health or medications since office visit

## 2022-07-19 ENCOUNTER — Institutional Professional Consult (permissible substitution) (INDEPENDENT_AMBULATORY_CARE_PROVIDER_SITE_OTHER): Payer: Medicare Other | Admitting: Thoracic Surgery (Cardiothoracic Vascular Surgery)

## 2022-07-19 ENCOUNTER — Other Ambulatory Visit: Payer: Self-pay | Admitting: Thoracic Surgery (Cardiothoracic Vascular Surgery)

## 2022-07-19 ENCOUNTER — Telehealth: Payer: Self-pay | Admitting: *Deleted

## 2022-07-19 VITALS — BP 135/86 | HR 87 | Resp 20 | Ht 65.0 in | Wt 174.0 lb

## 2022-07-19 DIAGNOSIS — R911 Solitary pulmonary nodule: Secondary | ICD-10-CM | POA: Diagnosis not present

## 2022-07-19 NOTE — Telephone Encounter (Signed)
  Follow up Call-     07/18/2022    1:33 PM  Call back number  Post procedure Call Back phone  # [phone]  Permission to leave phone message Yes     Patient questions:  Do you have a fever, pain , or abdominal swelling? No. Pain Score  0 *  Have you tolerated food without any problems? Yes.    Have you been able to return to your normal activities? Yes.    Do you have any questions about your discharge instructions: Diet   No. Medications  No. Follow up visit  No.  Do you have questions or concerns about your Care? No.  Actions: * If pain score is 4 or above: No action needed, pain <4.

## 2022-07-20 ENCOUNTER — Other Ambulatory Visit: Payer: Self-pay | Admitting: *Deleted

## 2022-07-20 ENCOUNTER — Encounter: Payer: Self-pay | Admitting: *Deleted

## 2022-07-20 DIAGNOSIS — R911 Solitary pulmonary nodule: Secondary | ICD-10-CM

## 2022-07-23 ENCOUNTER — Telehealth (HOSPITAL_COMMUNITY): Payer: Self-pay | Admitting: *Deleted

## 2022-07-23 NOTE — Telephone Encounter (Signed)
Patient given detailed instructions per Myocardial Perfusion Study Information Sheet for the test on 07/25/22 Patient notified to arrive 15 minutes early and that it is imperative to arrive on time for appointment to keep from having the test rescheduled.  If you need to cancel or reschedule your appointment, please call the office within 24 hours of your appointment. . Patient verbalized understanding.Crystal Mora   

## 2022-07-24 ENCOUNTER — Other Ambulatory Visit: Payer: Self-pay | Admitting: Thoracic Surgery (Cardiothoracic Vascular Surgery)

## 2022-07-24 DIAGNOSIS — R911 Solitary pulmonary nodule: Secondary | ICD-10-CM

## 2022-07-25 ENCOUNTER — Ambulatory Visit (HOSPITAL_COMMUNITY): Payer: Medicare Other | Attending: Thoracic Surgery (Cardiothoracic Vascular Surgery)

## 2022-07-25 VITALS — Ht 65.0 in | Wt 174.0 lb

## 2022-07-25 DIAGNOSIS — Z01810 Encounter for preprocedural cardiovascular examination: Secondary | ICD-10-CM | POA: Diagnosis not present

## 2022-07-25 DIAGNOSIS — Z79899 Other long term (current) drug therapy: Secondary | ICD-10-CM | POA: Diagnosis not present

## 2022-07-25 DIAGNOSIS — F17200 Nicotine dependence, unspecified, uncomplicated: Secondary | ICD-10-CM | POA: Diagnosis not present

## 2022-07-25 DIAGNOSIS — I10 Essential (primary) hypertension: Secondary | ICD-10-CM | POA: Insufficient documentation

## 2022-07-25 DIAGNOSIS — Z01818 Encounter for other preprocedural examination: Secondary | ICD-10-CM | POA: Diagnosis present

## 2022-07-25 DIAGNOSIS — R911 Solitary pulmonary nodule: Secondary | ICD-10-CM | POA: Insufficient documentation

## 2022-07-25 LAB — MYOCARDIAL PERFUSION IMAGING
LV dias vol: 71 mL (ref 46–106)
LV sys vol: 27 mL
Nuc Stress EF: 62 %
Peak HR: 83 {beats}/min
Rest HR: 68 {beats}/min
Rest Nuclear Isotope Dose: 10.3 mCi
SDS: 3
SRS: 0
SSS: 3
ST Depression (mm): 0 mm
Stress Nuclear Isotope Dose: 31.4 mCi
TID: 0.95

## 2022-07-25 MED ORDER — REGADENOSON 0.4 MG/5ML IV SOLN
0.4000 mg | Freq: Once | INTRAVENOUS | Status: AC
  Administered 2022-07-25: 0.4 mg via INTRAVENOUS

## 2022-07-25 MED ORDER — TECHNETIUM TC 99M TETROFOSMIN IV KIT
10.3000 | PACK | Freq: Once | INTRAVENOUS | Status: AC | PRN
  Administered 2022-07-25: 10.3 via INTRAVENOUS

## 2022-07-25 MED ORDER — TECHNETIUM TC 99M TETROFOSMIN IV KIT
31.4000 | PACK | Freq: Once | INTRAVENOUS | Status: AC | PRN
  Administered 2022-07-25: 31.4 via INTRAVENOUS

## 2022-08-07 NOTE — Progress Notes (Signed)
Surgical Instructions    Your procedure is scheduled on Friday December 29.  Report to [facility] Main Entrance "A" at 5:30 A.M., then check in with the Admitting office.  Call this number if you have problems the morning of surgery:  [phone]   If you have any questions prior to your surgery date call [phone]: Open Monday-Friday 8am-4pm If you experience any cold or flu symptoms such as cough, fever, chills, shortness of breath, etc. between now and your scheduled surgery, please notify us at the above number     Remember:  Do not eat or drink anything after midnight the night before your surgery    Take these medicines the morning of surgery with A SIP OF WATER:  amLODipine (NORVASC)  diazepam (VALIUM) diphenhydrAMINE (BENADRYL) if needed EPINEPHrine (EPIPEN 2-PAK) if needed HYDROcodone-acetaminophen ([place]) if needed hydrOXYzine (ATARAX) if needed ketotifen (ALLERGY EYE DROPS)  omeprazole (PRILOSEC)  pantoprazole (PROTONIX)  RESTASIS 0.05 %  rosuvastatin (CRESTOR)  sucralfate (CARAFATE)    As of today, STOP taking any Aspirin (unless otherwise instructed by your surgeon) Aleve, Naproxen, Ibuprofen, Motrin, Advil, Goody's, BC's, all herbal medications, fish oil, and all vitamins.          Do not wear jewelry or makeup. Do not wear lotions, powders, perfumes/cologne or deodorant. Do not shave 48 hours prior to surgery.  Men may shave face and neck. Do not bring valuables to the hospital. Do not wear nail polish, gel polish, artificial nails, or any other type of covering on natural nails (fingers and toes) If you have artificial nails or gel coating that need to be removed by a nail salon, please have this removed prior to surgery. Artificial nails or gel coating may interfere with anesthesia's ability to adequately monitor your vital signs.  [facility] is not responsible for any belongings or valuables.    Do NOT Smoke (Tobacco/Vaping)  24 hours prior to your  procedure  If you use a CPAP at night, you may bring your mask for your overnight stay.   Contacts, glasses, hearing aids, dentures or partials may not be worn into surgery, please bring cases for these belongings   For patients admitted to the hospital, discharge time will be determined by your treatment team.   Patients discharged the day of surgery will not be allowed to drive home, and someone needs to stay with them for 24 hours.   SURGICAL WAITING ROOM VISITATION Patients having surgery or a procedure may have no more than 2 support people in the waiting area - these visitors may rotate.   Children under the age of 44 must have an adult with them who is not the patient. If the patient needs to stay at the hospital during part of their recovery, the visitor guidelines for inpatient rooms apply. Pre-op nurse will coordinate an appropriate time for 1 support person to accompany patient in pre-op.  This support person may not rotate.   Please refer to [email or web address] for the visitor guidelines for Inpatients (after your surgery is over and you are in a regular room).    Special instructions:    Oral Hygiene is also important to reduce your risk of infection.  Remember - BRUSH YOUR TEETH THE MORNING OF SURGERY WITH YOUR REGULAR TOOTHPASTE   [facility]- Preparing For Surgery  Before surgery, you can play an important role. Because skin is not sterile, your skin needs to be as free of germs as possible. You can reduce the number of germs  on your skin by washing with CHG (chlorahexidine gluconate) Soap before surgery.  CHG is an antiseptic cleaner which kills germs and bonds with the skin to continue killing germs even after washing.     Please do not use if you have an allergy to CHG or antibacterial soaps. If your skin becomes reddened/irritated stop using the CHG.  Do not shave (including legs and underarms) for at least 48 hours  prior to first CHG shower. It is OK to shave your face.  Please follow these instructions carefully.     Shower the NIGHT BEFORE SURGERY and the MORNING OF SURGERY with CHG Soap.   If you chose to wash your hair, wash your hair first as usual with your normal shampoo. After you shampoo, rinse your hair and body thoroughly to remove the shampoo.  Then [employer] and genitals (private parts) with your normal soap and rinse thoroughly to remove soap.  After that Use CHG Soap as you would any other liquid soap. You can apply CHG directly to the skin and wash gently with a scrungie or a clean washcloth.   Apply the CHG Soap to your body ONLY FROM THE NECK DOWN.  Do not use on open wounds or open sores. Avoid contact with your eyes, ears, mouth and genitals (private parts). Wash Face and genitals (private parts)  with your normal soap.   Wash thoroughly, paying special attention to the area where your surgery will be performed.  Thoroughly rinse your body with warm water from the neck down.  DO NOT shower/wash with your normal soap after using and rinsing off the CHG Soap.  Pat yourself dry with a CLEAN TOWEL.  Wear CLEAN PAJAMAS to bed the night before surgery  Place CLEAN SHEETS on your bed the night before your surgery  DO NOT SLEEP WITH PETS.   Day of Surgery:  Take a shower with CHG soap. Wear Clean/Comfortable clothing the morning of surgery Do not apply any deodorants/lotions.   Remember to brush your teeth WITH YOUR REGULAR TOOTHPASTE.    If you received a COVID test during your pre-op visit, it is requested that you wear a mask when out in public, stay away from anyone that may not be feeling well, and notify your surgeon if you develop symptoms. If you have been in contact with anyone that has tested positive in the last 10 days, please notify your surgeon.    Please read over the following fact sheets that you were given.

## 2022-08-08 ENCOUNTER — Other Ambulatory Visit: Payer: Self-pay

## 2022-08-08 ENCOUNTER — Inpatient Hospital Stay (HOSPITAL_COMMUNITY): Payer: Medicare Other | Admitting: Anesthesiology

## 2022-08-08 ENCOUNTER — Ambulatory Visit (HOSPITAL_COMMUNITY)
Admission: RE | Admit: 2022-08-08 | Discharge: 2022-08-08 | Disposition: A | Discharge status: Home / Self Care | Payer: Medicare Other | Source: Ambulatory Visit | Attending: Thoracic Surgery (Cardiothoracic Vascular Surgery) | Admitting: Thoracic Surgery (Cardiothoracic Vascular Surgery)

## 2022-08-08 ENCOUNTER — Encounter (HOSPITAL_COMMUNITY)
Admission: RE | Admit: 2022-08-08 | Discharge: 2022-08-08 | Disposition: A | Discharge status: Home / Self Care | Payer: Medicare Other | Source: Ambulatory Visit | Attending: Pulmonary Disease | Admitting: Pulmonary Disease

## 2022-08-08 ENCOUNTER — Encounter (HOSPITAL_COMMUNITY): Payer: Self-pay

## 2022-08-08 VITALS — BP 136/95 | HR 91 | Temp 97.8°F | Resp 18 | Ht 64.0 in | Wt 179.0 lb

## 2022-08-08 DIAGNOSIS — Z01818 Encounter for other preprocedural examination: Secondary | ICD-10-CM | POA: Insufficient documentation

## 2022-08-08 DIAGNOSIS — E1369 Other specified diabetes mellitus with other specified complication: Secondary | ICD-10-CM | POA: Insufficient documentation

## 2022-08-08 DIAGNOSIS — R911 Solitary pulmonary nodule: Secondary | ICD-10-CM | POA: Insufficient documentation

## 2022-08-08 DIAGNOSIS — Z1152 Encounter for screening for COVID-19: Secondary | ICD-10-CM | POA: Insufficient documentation

## 2022-08-08 LAB — CBC
HCT: 45.1 % (ref 36.0–46.0)
Hemoglobin: 14.4 g/dL (ref 12.0–15.0)
MCH: 28.2 pg (ref 26.0–34.0)
MCHC: 31.9 g/dL (ref 30.0–36.0)
MCV: 88.4 fL (ref 80.0–100.0)
Platelets: 256 10*3/uL (ref 150–400)
RBC: 5.1 MIL/uL (ref 3.87–5.11)
RDW: 14.1 % (ref 11.5–15.5)
WBC: 7.5 10*3/uL (ref 4.0–10.5)
nRBC: 0 % (ref 0.0–0.2)

## 2022-08-08 LAB — TYPE AND SCREEN
ABO/RH(D): B POS
Antibody Screen: NEGATIVE

## 2022-08-08 LAB — COMPREHENSIVE METABOLIC PANEL
ALT: 24 U/L (ref 0–44)
AST: 22 U/L (ref 15–41)
Albumin: 3.7 g/dL (ref 3.5–5.0)
Alkaline Phosphatase: 77 U/L (ref 38–126)
Anion gap: 5 (ref 5–15)
BUN: 25 mg/dL — ABNORMAL HIGH (ref 6–20)
CO2: 31 mmol/L (ref 22–32)
Calcium: 9.6 mg/dL (ref 8.9–10.3)
Chloride: 104 mmol/L (ref 98–111)
Creatinine, Ser: 1.04 mg/dL — ABNORMAL HIGH (ref 0.44–1.00)
GFR, Estimated: >60 mL/min (ref >60)
Glucose, Bld: 103 mg/dL — ABNORMAL HIGH (ref 70–99)
Potassium: 4.1 mmol/L (ref 3.5–5.1)
Sodium: 140 mmol/L (ref 135–145)
Total Bilirubin: 0.2 mg/dL — ABNORMAL LOW (ref 0.3–1.2)
Total Protein: 7.1 g/dL (ref 6.5–8.1)

## 2022-08-08 LAB — APTT: aPTT: 29 seconds (ref 24–36)

## 2022-08-08 LAB — URINALYSIS, ROUTINE W REFLEX MICROSCOPIC
Bilirubin Urine: NEGATIVE
Glucose, UA: NEGATIVE mg/dL
Ketones, ur: NEGATIVE mg/dL
Leukocytes,Ua: NEGATIVE
Nitrite: NEGATIVE
Protein, ur: 30 mg/dL — AB
Specific Gravity, Urine: 1.036 — ABNORMAL HIGH (ref 1.005–1.030)
pH: 5 (ref 5.0–8.0)

## 2022-08-08 LAB — PROTIME-INR
INR: 1 (ref 0.8–1.2)
Prothrombin Time: 12.7 seconds (ref 11.4–15.2)

## 2022-08-08 LAB — SURGICAL PCR SCREEN
MRSA, PCR: NEGATIVE
Staphylococcus aureus: NEGATIVE

## 2022-08-08 NOTE — Progress Notes (Signed)
Pt's UA was abnormal with a few bacteria present.  Surgeon notified via staff message.

## 2022-08-08 NOTE — Progress Notes (Signed)
PCP - Linward Natal Cardiologist - Denies  PPM/ICD - Denies   Chest x-ray - 08/08/2022 EKG - 08/08/2022 Stress Test - Yes. 07/25/2022 ECHO - Denies Cardiac Cath - Denies  Sleep Study - Denies  DM Denies  Blood Thinner Instructions: Denies Aspirin Instructions: Requested pt to follow up with dr Kipp Brood for instructions.   COVID TEST- 08/08/2022   Anesthesia review: No  Patient denies shortness of breath, fever, cough and chest pain at PAT appointment    Patient also instructed to wear a mask while in public after being tested for COVID-19. The opportunity to ask questions was provided.

## 2022-08-09 ENCOUNTER — Other Ambulatory Visit: Payer: Self-pay

## 2022-08-09 ENCOUNTER — Other Ambulatory Visit: Payer: Self-pay | Admitting: Gastroenterology

## 2022-08-09 DIAGNOSIS — D509 Iron deficiency anemia, unspecified: Secondary | ICD-10-CM

## 2022-08-09 DIAGNOSIS — R1010 Upper abdominal pain, unspecified: Secondary | ICD-10-CM

## 2022-08-09 DIAGNOSIS — Z8711 Personal history of peptic ulcer disease: Secondary | ICD-10-CM

## 2022-08-09 LAB — SARS CORONAVIRUS 2 (TAT 6-24 HRS): SARS Coronavirus 2: NEGATIVE

## 2022-08-09 MED ORDER — PANTOPRAZOLE SODIUM 40 MG PO TBEC: 40.0000 mg | DELAYED_RELEASE_TABLET | Freq: Two times a day (BID) | ORAL | 1 refills | Status: DC

## 2022-08-09 NOTE — Anesthesia Preprocedure Evaluation (Addendum)
Anesthesia Evaluation  Patient identified by MRN, date of birth, ID band Patient awake    Reviewed: Allergy & Precautions, NPO status , Patient's Chart, lab work & pertinent test results  History of Anesthesia Complications Negative for: history of anesthetic complications  Airway Mallampati: II  TM Distance: >3 FB Neck ROM: Full    Dental no notable dental hx. (+) Dental Advisory Given   Pulmonary Patient abstained from smoking., former smoker   Pulmonary exam normal        Cardiovascular hypertension, Pt. on medications Normal cardiovascular exam     Neuro/Psych  Headaches PSYCHIATRIC DISORDERS Anxiety Depression Bipolar Disorder      GI/Hepatic Neg liver ROS, PUD,GERD  Medicated,,  Endo/Other  diabetes    Renal/GU negative Renal ROS     Musculoskeletal  (+) Arthritis ,    Abdominal   Peds  Hematology negative hematology ROS (+)   Anesthesia Other Findings   Reproductive/Obstetrics                             Anesthesia Physical Anesthesia Plan  ASA: 3  Anesthesia Plan: General   Post-op Pain Management: Tylenol PO (pre-op)* and Celebrex PO (pre-op)*   Induction: Intravenous  PONV Risk Score and Plan: 3 and Treatment may vary due to age or medical condition, Ondansetron, Dexamethasone and Midazolam  Airway Management Planned: Oral ETT  Additional Equipment: None  Intra-op Plan:   Post-operative Plan: Extubation in OR  Informed Consent: I have reviewed the patients History and Physical, chart, labs and discussed the procedure including the risks, benefits and alternatives for the proposed anesthesia with the patient or authorized representative who has indicated his/her understanding and acceptance.     Dental advisory given  Plan Discussed with: CRNA and Anesthesiologist  Anesthesia Plan Comments:        Anesthesia Quick Evaluation

## 2022-08-09 NOTE — Anesthesia Preprocedure Evaluation (Deleted)
Anesthesia Evaluation    Reviewed: Allergy & Precautions, Patient's Chart, lab work & pertinent test results  Airway        Dental   Pulmonary neg pulmonary ROS, former smoker          Cardiovascular hypertension,   07/25/22 Myo-perfusion   Impression No impression found.  Narrative   The study is normal. The study is low risk.   No ST deviation was noted.   Left ventricular function is normal. Nuclear stress EF: 62 %. The left ventricular ejection fraction is normal (55-65%). End diastolic cavity size is normal.      Neuro/Psych  PSYCHIATRIC DISORDERS Anxiety Depression Bipolar Disorder   negative neurological ROS     GI/Hepatic Neg liver ROS,GERD  ,,  Endo/Other  negative endocrine ROSdiabetes    Renal/GU negative Renal ROS     Musculoskeletal negative musculoskeletal ROS (+)    Abdominal   Peds  Hematology negative hematology ROS (+)   Anesthesia Other Findings   Reproductive/Obstetrics                             Anesthesia Physical Anesthesia Plan  ASA: 3  Anesthesia Plan: General   Post-op Pain Management: Celebrex PO (pre-op)* and Tylenol PO (pre-op)*   Induction: Intravenous  PONV Risk Score and Plan: 4 or greater and Ondansetron, Dexamethasone, Midazolam and Scopolamine patch - Pre-op  Airway Management Planned: Double Lumen EBT  Additional Equipment: ClearSight  Intra-op Plan:   Post-operative Plan: Possible Post-op intubation/ventilation  Informed Consent:   Plan Discussed with: Anesthesiologist  Anesthesia Plan Comments:        Anesthesia Quick Evaluation

## 2022-08-10 ENCOUNTER — Ambulatory Visit (HOSPITAL_COMMUNITY): Payer: Medicare Other

## 2022-08-10 ENCOUNTER — Inpatient Hospital Stay (HOSPITAL_COMMUNITY)
Admission: RE | Admit: 2022-08-10 | Discharge: 2022-08-14 | DRG: 164 | Disposition: A | Discharge status: Home / Self Care | Payer: Medicare Other | Source: Ambulatory Visit | Attending: Thoracic Surgery (Cardiothoracic Vascular Surgery) | Admitting: Thoracic Surgery (Cardiothoracic Vascular Surgery)

## 2022-08-10 ENCOUNTER — Encounter (HOSPITAL_COMMUNITY)
Admission: RE | Disposition: A | Discharge status: Home / Self Care | Payer: Self-pay | Source: Ambulatory Visit | Attending: Thoracic Surgery (Cardiothoracic Vascular Surgery)

## 2022-08-10 ENCOUNTER — Other Ambulatory Visit: Payer: Self-pay

## 2022-08-10 ENCOUNTER — Ambulatory Visit (HOSPITAL_COMMUNITY): Payer: Medicare Other | Admitting: Anesthesiology

## 2022-08-10 ENCOUNTER — Inpatient Hospital Stay (HOSPITAL_COMMUNITY): Payer: Medicare Other

## 2022-08-10 ENCOUNTER — Encounter (HOSPITAL_COMMUNITY): Payer: Self-pay | Admitting: Pulmonary Disease

## 2022-08-10 DIAGNOSIS — C3431 Malignant neoplasm of lower lobe, right bronchus or lung: Secondary | ICD-10-CM

## 2022-08-10 DIAGNOSIS — E119 Type 2 diabetes mellitus without complications: Secondary | ICD-10-CM | POA: Diagnosis present

## 2022-08-10 DIAGNOSIS — I10 Essential (primary) hypertension: Secondary | ICD-10-CM | POA: Diagnosis not present

## 2022-08-10 DIAGNOSIS — R911 Solitary pulmonary nodule: Secondary | ICD-10-CM | POA: Diagnosis present

## 2022-08-10 DIAGNOSIS — Z91013 Allergy to seafood: Secondary | ICD-10-CM | POA: Diagnosis not present

## 2022-08-10 DIAGNOSIS — F17290 Nicotine dependence, other tobacco product, uncomplicated: Secondary | ICD-10-CM | POA: Diagnosis present

## 2022-08-10 DIAGNOSIS — Z79899 Other long term (current) drug therapy: Secondary | ICD-10-CM

## 2022-08-10 DIAGNOSIS — J9382 Other air leak: Secondary | ICD-10-CM | POA: Diagnosis not present

## 2022-08-10 DIAGNOSIS — Z801 Family history of malignant neoplasm of trachea, bronchus and lung: Secondary | ICD-10-CM

## 2022-08-10 DIAGNOSIS — J9383 Other pneumothorax: Secondary | ICD-10-CM | POA: Diagnosis not present

## 2022-08-10 DIAGNOSIS — E785 Hyperlipidemia, unspecified: Secondary | ICD-10-CM | POA: Diagnosis present

## 2022-08-10 DIAGNOSIS — J9811 Atelectasis: Secondary | ICD-10-CM | POA: Diagnosis not present

## 2022-08-10 DIAGNOSIS — Z825 Family history of asthma and other chronic lower respiratory diseases: Secondary | ICD-10-CM

## 2022-08-10 DIAGNOSIS — Z818 Family history of other mental and behavioral disorders: Secondary | ICD-10-CM | POA: Diagnosis not present

## 2022-08-10 DIAGNOSIS — Z7982 Long term (current) use of aspirin: Secondary | ICD-10-CM | POA: Diagnosis not present

## 2022-08-10 DIAGNOSIS — K219 Gastro-esophageal reflux disease without esophagitis: Secondary | ICD-10-CM | POA: Diagnosis not present

## 2022-08-10 DIAGNOSIS — Z1152 Encounter for screening for COVID-19: Secondary | ICD-10-CM

## 2022-08-10 DIAGNOSIS — Z902 Acquired absence of lung [part of]: Secondary | ICD-10-CM

## 2022-08-10 DIAGNOSIS — E1369 Other specified diabetes mellitus with other specified complication: Secondary | ICD-10-CM

## 2022-08-10 DIAGNOSIS — F17210 Nicotine dependence, cigarettes, uncomplicated: Secondary | ICD-10-CM | POA: Diagnosis present

## 2022-08-10 DIAGNOSIS — Z87891 Personal history of nicotine dependence: Secondary | ICD-10-CM

## 2022-08-10 DIAGNOSIS — F319 Bipolar disorder, unspecified: Secondary | ICD-10-CM | POA: Diagnosis present

## 2022-08-10 DIAGNOSIS — Z833 Family history of diabetes mellitus: Secondary | ICD-10-CM | POA: Diagnosis not present

## 2022-08-10 DIAGNOSIS — G4700 Insomnia, unspecified: Secondary | ICD-10-CM | POA: Diagnosis present

## 2022-08-10 HISTORY — PX: LYMPH NODE DISSECTION: SHX5087

## 2022-08-10 HISTORY — PX: INTERCOSTAL NERVE BLOCK: SHX5021

## 2022-08-10 HISTORY — PX: BRONCHIAL NEEDLE ASPIRATION BIOPSY: SHX5106

## 2022-08-10 HISTORY — PX: FIDUCIAL MARKER PLACEMENT: SHX6858

## 2022-08-10 HISTORY — PX: BRONCHIAL BRUSHINGS: SHX5108

## 2022-08-10 LAB — ABO/RH: ABO/RH(D): B POS

## 2022-08-10 LAB — GLUCOSE, CAPILLARY: Glucose-Capillary: 143 mg/dL — ABNORMAL HIGH (ref 70–99)

## 2022-08-10 SURGERY — WEDGE RESECTION, LUNG, ROBOT-ASSISTED, THORACOSCOPIC
Anesthesia: General | Site: Chest | Laterality: Right

## 2022-08-10 SURGERY — BRONCHOSCOPY, WITH BIOPSY USING ELECTROMAGNETIC NAVIGATION
Anesthesia: General

## 2022-08-10 MED ORDER — ORAL CARE MOUTH RINSE: 15.0000 mL | Freq: Once | OROMUCOSAL | Status: AC

## 2022-08-10 MED ORDER — LACTATED RINGERS IV SOLN: INTRAVENOUS | Status: DC

## 2022-08-10 MED ORDER — SUCRALFATE 1 GM/10ML PO SUSP
1.0000 g | Freq: Four times a day (QID) | ORAL | Status: DC
  Administered 2022-08-10 – 2022-08-14 (×14): 1 g via ORAL
  Filled 2022-08-10 (×14): qty 10

## 2022-08-10 MED ORDER — ONDANSETRON HCL 4 MG/2ML IJ SOLN
INTRAMUSCULAR | Status: AC
  Filled 2022-08-10: qty 2

## 2022-08-10 MED ORDER — ACETAMINOPHEN 500 MG PO TABS: 1000.0000 mg | ORAL_TABLET | Freq: Once | ORAL | Status: DC

## 2022-08-10 MED ORDER — BUPIVACAINE HCL (PF) 0.5 % IJ SOLN
INTRAMUSCULAR | Status: AC
  Filled 2022-08-10: qty 30

## 2022-08-10 MED ORDER — PROMETHAZINE HCL 25 MG/ML IJ SOLN: 6.2500 mg | INTRAMUSCULAR | Status: DC | PRN

## 2022-08-10 MED ORDER — FENTANYL CITRATE (PF) 100 MCG/2ML IJ SOLN
INTRAMUSCULAR | Status: AC
  Filled 2022-08-10: qty 2

## 2022-08-10 MED ORDER — KETOTIFEN FUMARATE 0.035 % OP SOLN
1.0000 [drp] | Freq: Two times a day (BID) | OPHTHALMIC | Status: DC
  Administered 2022-08-10 – 2022-08-13 (×7): 1 [drp] via OPHTHALMIC
  Filled 2022-08-10 (×2): qty 5

## 2022-08-10 MED ORDER — FENTANYL CITRATE (PF) 250 MCG/5ML IJ SOLN
INTRAMUSCULAR | Status: AC
  Filled 2022-08-10: qty 5

## 2022-08-10 MED ORDER — CHLORHEXIDINE GLUCONATE 0.12 % MT SOLN
15.0000 mL | Freq: Once | OROMUCOSAL | Status: AC
  Administered 2022-08-10: 15 mL via OROMUCOSAL
  Filled 2022-08-10: qty 15

## 2022-08-10 MED ORDER — ACETAMINOPHEN 160 MG/5ML PO SOLN: 1000.0000 mg | Freq: Four times a day (QID) | ORAL | Status: DC

## 2022-08-10 MED ORDER — WHITE PETROLATUM EX OINT
TOPICAL_OINTMENT | CUTANEOUS | Status: DC | PRN
  Filled 2022-08-10: qty 28.35

## 2022-08-10 MED ORDER — FENTANYL CITRATE PF 50 MCG/ML IJ SOSY
50.0000 ug | PREFILLED_SYRINGE | INTRAMUSCULAR | Status: DC | PRN
  Administered 2022-08-10 (×2): 75 ug via INTRAVENOUS
  Administered 2022-08-11 (×4): 50 ug via INTRAVENOUS
  Administered 2022-08-11: 75 ug via INTRAVENOUS
  Filled 2022-08-10: qty 1
  Filled 2022-08-10 (×2): qty 2
  Filled 2022-08-10: qty 1
  Filled 2022-08-10: qty 2
  Filled 2022-08-10 (×3): qty 1

## 2022-08-10 MED ORDER — TRAMADOL HCL 50 MG PO TABS
50.0000 mg | ORAL_TABLET | Freq: Four times a day (QID) | ORAL | Status: DC | PRN
  Administered 2022-08-12 – 2022-08-13 (×3): 100 mg via ORAL
  Administered 2022-08-13 – 2022-08-14 (×2): 50 mg via ORAL
  Filled 2022-08-10 (×4): qty 2
  Filled 2022-08-10: qty 1
  Filled 2022-08-10: qty 2

## 2022-08-10 MED ORDER — CEFAZOLIN SODIUM-DEXTROSE 2-4 GM/100ML-% IV SOLN
2.0000 g | INTRAVENOUS | Status: AC
  Administered 2022-08-10: 2 g via INTRAVENOUS
  Filled 2022-08-10: qty 100

## 2022-08-10 MED ORDER — LACTATED RINGERS IV SOLN: INTRAVENOUS | Status: DC | PRN

## 2022-08-10 MED ORDER — ACETAMINOPHEN 325 MG PO TABS
650.0000 mg | ORAL_TABLET | Freq: Once | ORAL | Status: AC
  Administered 2022-08-10: 650 mg via ORAL

## 2022-08-10 MED ORDER — FENTANYL CITRATE (PF) 100 MCG/2ML IJ SOLN
25.0000 ug | INTRAMUSCULAR | Status: DC | PRN
  Administered 2022-08-10: 25 ug via INTRAVENOUS

## 2022-08-10 MED ORDER — MIDAZOLAM HCL 5 MG/5ML IJ SOLN
INTRAMUSCULAR | Status: DC | PRN
  Administered 2022-08-10: 2 mg via INTRAVENOUS

## 2022-08-10 MED ORDER — HYDROCODONE-ACETAMINOPHEN 5-325 MG PO TABS
1.0000 | ORAL_TABLET | ORAL | Status: DC | PRN
  Administered 2022-08-10 – 2022-08-11 (×3): 2 via ORAL
  Filled 2022-08-10 (×3): qty 2

## 2022-08-10 MED ORDER — LIDOCAINE 2% (20 MG/ML) 5 ML SYRINGE
INTRAMUSCULAR | Status: DC | PRN
  Administered 2022-08-10: 100 mg via INTRAVENOUS

## 2022-08-10 MED ORDER — AMLODIPINE BESYLATE 2.5 MG PO TABS
2.5000 mg | ORAL_TABLET | Freq: Every morning | ORAL | Status: DC
  Administered 2022-08-11 – 2022-08-14 (×4): 2.5 mg via ORAL
  Filled 2022-08-10 (×4): qty 1

## 2022-08-10 MED ORDER — FENTANYL CITRATE (PF) 250 MCG/5ML IJ SOLN
INTRAMUSCULAR | Status: DC | PRN
  Administered 2022-08-10 (×4): 50 ug via INTRAVENOUS
  Administered 2022-08-10: 100 ug via INTRAVENOUS
  Administered 2022-08-10 (×3): 50 ug via INTRAVENOUS

## 2022-08-10 MED ORDER — ACETAMINOPHEN 500 MG PO TABS
1000.0000 mg | ORAL_TABLET | Freq: Once | ORAL | Status: DC
  Filled 2022-08-10: qty 2

## 2022-08-10 MED ORDER — SODIUM CHLORIDE 0.9 % IV SOLN: INTRAVENOUS | Status: DC | PRN

## 2022-08-10 MED ORDER — ONDANSETRON HCL 4 MG/2ML IJ SOLN: 4.0000 mg | Freq: Four times a day (QID) | INTRAMUSCULAR | Status: DC | PRN

## 2022-08-10 MED ORDER — ROCURONIUM BROMIDE 10 MG/ML (PF) SYRINGE
PREFILLED_SYRINGE | INTRAVENOUS | Status: DC | PRN
  Administered 2022-08-10: 20 mg via INTRAVENOUS
  Administered 2022-08-10: 50 mg via INTRAVENOUS
  Administered 2022-08-10: 100 mg via INTRAVENOUS

## 2022-08-10 MED ORDER — PANTOPRAZOLE SODIUM 40 MG PO TBEC
40.0000 mg | DELAYED_RELEASE_TABLET | Freq: Every day | ORAL | Status: DC
  Administered 2022-08-11 – 2022-08-14 (×4): 40 mg via ORAL
  Filled 2022-08-10 (×4): qty 1

## 2022-08-10 MED ORDER — METHYLENE BLUE 1 % INJ SOLN
INTRAVENOUS | Status: DC | PRN
  Administered 2022-08-10: 1.5 mL

## 2022-08-10 MED ORDER — ASPIRIN 81 MG PO TBEC
81.0000 mg | DELAYED_RELEASE_TABLET | Freq: Every morning | ORAL | Status: DC
  Administered 2022-08-11 – 2022-08-14 (×4): 81 mg via ORAL
  Filled 2022-08-10 (×4): qty 1

## 2022-08-10 MED ORDER — DEXAMETHASONE SODIUM PHOSPHATE 10 MG/ML IJ SOLN
INTRAMUSCULAR | Status: DC | PRN
  Administered 2022-08-10: 10 mg via INTRAVENOUS

## 2022-08-10 MED ORDER — PROPOFOL 10 MG/ML IV BOLUS
INTRAVENOUS | Status: AC
  Filled 2022-08-10: qty 20

## 2022-08-10 MED ORDER — CYCLOSPORINE 0.05 % OP EMUL
1.0000 [drp] | Freq: Two times a day (BID) | OPHTHALMIC | Status: DC
  Administered 2022-08-10 – 2022-08-13 (×7): 1 [drp] via OPHTHALMIC
  Filled 2022-08-10 (×11): qty 30

## 2022-08-10 MED ORDER — SCOPOLAMINE 1 MG/3DAYS TD PT72
1.0000 | MEDICATED_PATCH | TRANSDERMAL | Status: DC
  Administered 2022-08-10: 1.5 mg via TRANSDERMAL
  Filled 2022-08-10: qty 1

## 2022-08-10 MED ORDER — AMISULPRIDE (ANTIEMETIC) 5 MG/2ML IV SOLN: 10.0000 mg | Freq: Once | INTRAVENOUS | Status: DC | PRN

## 2022-08-10 MED ORDER — ROSUVASTATIN CALCIUM 5 MG PO TABS
5.0000 mg | ORAL_TABLET | Freq: Every morning | ORAL | Status: DC
  Administered 2022-08-11 – 2022-08-14 (×4): 5 mg via ORAL
  Filled 2022-08-10 (×4): qty 1

## 2022-08-10 MED ORDER — CELECOXIB 200 MG PO CAPS: 200.0000 mg | ORAL_CAPSULE | Freq: Once | ORAL | Status: AC

## 2022-08-10 MED ORDER — FENTANYL CITRATE (PF) 100 MCG/2ML IJ SOLN: 25.0000 ug | INTRAMUSCULAR | Status: DC | PRN

## 2022-08-10 MED ORDER — SUGAMMADEX SODIUM 200 MG/2ML IV SOLN
INTRAVENOUS | Status: DC | PRN
  Administered 2022-08-10: 200 mg via INTRAVENOUS

## 2022-08-10 MED ORDER — BUPIVACAINE LIPOSOME 1.3 % IJ SUSP
INTRAMUSCULAR | Status: DC | PRN
  Administered 2022-08-10: 100 mL

## 2022-08-10 MED ORDER — HEMOSTATIC AGENTS (NO CHARGE) OPTIME: TOPICAL | Status: DC | PRN

## 2022-08-10 MED ORDER — CELECOXIB 200 MG PO CAPS
200.0000 mg | ORAL_CAPSULE | Freq: Once | ORAL | Status: AC
  Administered 2022-08-10: 200 mg via ORAL
  Filled 2022-08-10: qty 1

## 2022-08-10 MED ORDER — BUPIVACAINE LIPOSOME 1.3 % IJ SUSP
INTRAMUSCULAR | Status: AC
  Filled 2022-08-10: qty 20

## 2022-08-10 MED ORDER — ONDANSETRON HCL 4 MG/2ML IJ SOLN
INTRAMUSCULAR | Status: DC | PRN
  Administered 2022-08-10: 4 mg via INTRAVENOUS

## 2022-08-10 MED ORDER — DIPHENHYDRAMINE HCL 25 MG PO CAPS
25.0000 mg | ORAL_CAPSULE | Freq: Two times a day (BID) | ORAL | Status: DC | PRN
  Administered 2022-08-12 (×2): 25 mg via ORAL
  Filled 2022-08-10 (×2): qty 1

## 2022-08-10 MED ORDER — LOSARTAN POTASSIUM 50 MG PO TABS
100.0000 mg | ORAL_TABLET | Freq: Every morning | ORAL | Status: DC
  Administered 2022-08-11 – 2022-08-14 (×4): 100 mg via ORAL
  Filled 2022-08-10 (×4): qty 2

## 2022-08-10 MED ORDER — PROPOFOL 10 MG/ML IV BOLUS
INTRAVENOUS | Status: DC | PRN
  Administered 2022-08-10: 200 mg via INTRAVENOUS

## 2022-08-10 MED ORDER — DIAZEPAM 5 MG PO TABS
5.0000 mg | ORAL_TABLET | Freq: Two times a day (BID) | ORAL | Status: DC | PRN
  Administered 2022-08-11 – 2022-08-12 (×2): 5 mg via ORAL
  Filled 2022-08-10 (×2): qty 1

## 2022-08-10 MED ORDER — ACETAMINOPHEN 325 MG PO TABS
ORAL_TABLET | ORAL | Status: AC
  Filled 2022-08-10: qty 2

## 2022-08-10 MED ORDER — SENNOSIDES-DOCUSATE SODIUM 8.6-50 MG PO TABS
1.0000 | ORAL_TABLET | Freq: Every day | ORAL | Status: DC
  Administered 2022-08-11 – 2022-08-13 (×3): 1 via ORAL
  Filled 2022-08-10 (×4): qty 1

## 2022-08-10 MED ORDER — KETOROLAC TROMETHAMINE 15 MG/ML IJ SOLN
15.0000 mg | Freq: Four times a day (QID) | INTRAMUSCULAR | Status: DC
  Administered 2022-08-10 – 2022-08-11 (×4): 15 mg via INTRAVENOUS
  Filled 2022-08-10 (×5): qty 1

## 2022-08-10 MED ORDER — ENOXAPARIN SODIUM 40 MG/0.4ML IJ SOSY
40.0000 mg | PREFILLED_SYRINGE | Freq: Every day | INTRAMUSCULAR | Status: DC
  Administered 2022-08-11 – 2022-08-14 (×4): 40 mg via SUBCUTANEOUS
  Filled 2022-08-10 (×4): qty 0.4

## 2022-08-10 MED ORDER — MIDAZOLAM HCL 2 MG/2ML IJ SOLN
INTRAMUSCULAR | Status: AC
  Filled 2022-08-10: qty 2

## 2022-08-10 MED ORDER — BISACODYL 5 MG PO TBEC
10.0000 mg | DELAYED_RELEASE_TABLET | Freq: Every day | ORAL | Status: DC
  Administered 2022-08-11 – 2022-08-14 (×4): 10 mg via ORAL
  Filled 2022-08-10 (×4): qty 2

## 2022-08-10 MED ORDER — ACETAMINOPHEN 500 MG PO TABS
1000.0000 mg | ORAL_TABLET | Freq: Four times a day (QID) | ORAL | Status: DC
  Administered 2022-08-10: 1000 mg via ORAL
  Filled 2022-08-10 (×3): qty 2

## 2022-08-10 MED ORDER — CEFAZOLIN SODIUM-DEXTROSE 2-4 GM/100ML-% IV SOLN
2.0000 g | Freq: Three times a day (TID) | INTRAVENOUS | Status: AC
  Administered 2022-08-10 (×2): 2 g via INTRAVENOUS
  Filled 2022-08-10 (×2): qty 100

## 2022-08-10 SURGICAL SUPPLY — 102 items
BLADE CLIPPER SURG (BLADE) ×1 IMPLANT
BLADE SURG 11 STRL SS (BLADE) IMPLANT
CANISTER SUCT 3000ML PPV (MISCELLANEOUS) ×2 IMPLANT
CANNULA REDUC XI 12-8 STAPL (CANNULA) ×2
CANNULA REDUCER 12-8 DVNC XI (CANNULA) ×2 IMPLANT
CATH TROCAR 20FR (CATHETERS) IMPLANT
CHLORAPREP W/TINT 26 (MISCELLANEOUS) ×1 IMPLANT
CLIP VESOCCLUDE MED 6/CT (CLIP) IMPLANT
CNTNR URN SCR LID CUP LEK RST (MISCELLANEOUS) ×5 IMPLANT
CONN ST 1/4X3/8  BEN (MISCELLANEOUS)
CONN ST 1/4X3/8 BEN (MISCELLANEOUS) IMPLANT
CONT SPEC 4OZ STRL OR WHT (MISCELLANEOUS) ×11
DEFOGGER SCOPE WARMER CLEARIFY (MISCELLANEOUS) ×1 IMPLANT
DERMABOND ADVANCED .7 DNX12 (GAUZE/BANDAGES/DRESSINGS) ×1 IMPLANT
DRAIN CHANNEL 28F RND 3/8 FF (WOUND CARE) IMPLANT
DRAIN CHANNEL 32F RND 10.7 FF (WOUND CARE) IMPLANT
DRAIN SARATOGA STRT 28FR 20 (WOUND CARE) IMPLANT
DRAPE ARM DVNC X/XI (DISPOSABLE) ×4 IMPLANT
DRAPE COLUMN DVNC XI (DISPOSABLE) ×1 IMPLANT
DRAPE CV SPLIT W-CLR ANES SCRN (DRAPES) ×1 IMPLANT
DRAPE DA VINCI XI ARM (DISPOSABLE) ×4
DRAPE DA VINCI XI COLUMN (DISPOSABLE) ×1
DRAPE HALF SHEET 40X57 (DRAPES) ×1 IMPLANT
DRAPE ORTHO SPLIT 77X108 STRL (DRAPES) ×1
DRAPE SURG ORHT 6 SPLT 77X108 (DRAPES) ×1 IMPLANT
ELECT BLADE 6.5 EXT (BLADE) IMPLANT
ELECT REM PT RETURN 9FT ADLT (ELECTROSURGICAL) ×1
ELECTRODE REM PT RTRN 9FT ADLT (ELECTROSURGICAL) ×1 IMPLANT
GAUZE KITTNER 4X5 RF (MISCELLANEOUS) ×1 IMPLANT
GAUZE SPONGE 4X4 12PLY STRL (GAUZE/BANDAGES/DRESSINGS) ×1 IMPLANT
GLOVE BIO SURGEON STRL SZ7.5 (GLOVE) ×2 IMPLANT
GLOVE SURG SS PI 8.0 STRL IVOR (GLOVE) ×1 IMPLANT
GOWN STRL REUS W/ TWL LRG LVL3 (GOWN DISPOSABLE) ×2 IMPLANT
GOWN STRL REUS W/ TWL XL LVL3 (GOWN DISPOSABLE) ×2 IMPLANT
GOWN STRL REUS W/TWL 2XL LVL3 (GOWN DISPOSABLE) ×1 IMPLANT
GOWN STRL REUS W/TWL LRG LVL3 (GOWN DISPOSABLE) ×2
GOWN STRL REUS W/TWL XL LVL3 (GOWN DISPOSABLE) ×2
HEMOSTAT SURGICEL 2X14 (HEMOSTASIS) ×3 IMPLANT
KIT BASIN OR (CUSTOM PROCEDURE TRAY) ×1 IMPLANT
KIT SUCTION CATH 14FR (SUCTIONS) IMPLANT
KIT TURNOVER KIT B (KITS) ×1 IMPLANT
NDL 22X1.5 STRL (OR ONLY) (MISCELLANEOUS) ×1 IMPLANT
NDL HYPO 25GX1X1/2 BEV (NEEDLE) ×1 IMPLANT
NEEDLE 22X1.5 STRL (OR ONLY) (MISCELLANEOUS) ×1 IMPLANT
NEEDLE HYPO 25GX1X1/2 BEV (NEEDLE) ×1 IMPLANT
NS IRRIG 1000ML POUR BTL (IV SOLUTION) ×3 IMPLANT
PACK CHEST (CUSTOM PROCEDURE TRAY) ×1 IMPLANT
PAD ARMBOARD 7.5X6 YLW CONV (MISCELLANEOUS) ×5 IMPLANT
PORT ACCESS TROCAR AIRSEAL 12 (TROCAR) ×1 IMPLANT
RELOAD STAPLE 45 2.0 GRY DVNC (STAPLE) IMPLANT
RELOAD STAPLE 45 2.5 WHT DVNC (STAPLE) IMPLANT
RELOAD STAPLE 45 3.5 BLU DVNC (STAPLE) IMPLANT
RELOAD STAPLE 45 4.3 GRN DVNC (STAPLE) IMPLANT
RELOAD STAPLER 2.5X45 WHT DVNC (STAPLE) ×3 IMPLANT
RELOAD STAPLER 3.5X45 BLU DVNC (STAPLE) ×4 IMPLANT
RELOAD STAPLER 4.3X45 GRN DVNC (STAPLE) ×2 IMPLANT
SEAL CANN UNIV 5-8 DVNC XI (MISCELLANEOUS) ×2 IMPLANT
SEAL XI 5MM-8MM UNIVERSAL (MISCELLANEOUS) ×2
SET TRI-LUMEN FLTR TB AIRSEAL (TUBING) ×1 IMPLANT
SOLUTION ELECTROLUBE (MISCELLANEOUS) IMPLANT
SPONGE INTESTINAL PEANUT (DISPOSABLE) IMPLANT
STAPLE RELOAD 45 2.0 GRAY (STAPLE) ×1
STAPLE RELOAD 45 2.0 GRAY DVNC (STAPLE) ×1 IMPLANT
STAPLER 45 SUREFORM CVD (STAPLE) ×1
STAPLER 45 SUREFORM CVD DVNC (STAPLE) IMPLANT
STAPLER CANNULA SEAL DVNC XI (STAPLE) ×2 IMPLANT
STAPLER CANNULA SEAL XI (STAPLE) ×2
STAPLER RELOAD 2.5X45 WHITE (STAPLE) ×3
STAPLER RELOAD 2.5X45 WHT DVNC (STAPLE) ×3
STAPLER RELOAD 3.5X45 BLU DVNC (STAPLE) ×4
STAPLER RELOAD 3.5X45 BLUE (STAPLE) ×4
STAPLER RELOAD 4.3X45 GREEN (STAPLE) ×2
STAPLER RELOAD 4.3X45 GRN DVNC (STAPLE) ×2
STOPCOCK 4 WAY LG BORE MALE ST (IV SETS) ×1 IMPLANT
SUT PDS AB 1 CTX 36 (SUTURE) IMPLANT
SUT PROLENE 4 0 RB 1 (SUTURE)
SUT PROLENE 4-0 RB1 .5 CRCL 36 (SUTURE) IMPLANT
SUT SILK  1 MH (SUTURE) ×1
SUT SILK 1 MH (SUTURE) ×1 IMPLANT
SUT SILK 2 0 SH (SUTURE) IMPLANT
SUT SILK 2 0SH CR/8 30 (SUTURE) IMPLANT
SUT VIC AB 1 CTX 36 (SUTURE)
SUT VIC AB 1 CTX36XBRD ANBCTR (SUTURE) IMPLANT
SUT VIC AB 2-0 CT1 27 (SUTURE) ×1
SUT VIC AB 2-0 CT1 TAPERPNT 27 (SUTURE) ×1 IMPLANT
SUT VIC AB 3-0 SH 27 (SUTURE) ×3
SUT VIC AB 3-0 SH 27X BRD (SUTURE) ×3 IMPLANT
SUT VICRYL 0 TIES 12 18 (SUTURE) ×1 IMPLANT
SUT VICRYL 0 UR6 27IN ABS (SUTURE) ×2 IMPLANT
SYR 10ML LL (SYRINGE) ×1 IMPLANT
SYR 20ML LL LF (SYRINGE) ×1 IMPLANT
SYR 50ML LL SCALE MARK (SYRINGE) ×1 IMPLANT
SYSTEM RETRIEVAL ANCHOR 15 (MISCELLANEOUS) IMPLANT
SYSTEM RETRIEVAL ANCHOR 8 (MISCELLANEOUS) IMPLANT
SYSTEM SAHARA CHEST DRAIN ATS (WOUND CARE) ×1 IMPLANT
TAPE CLOTH 4X10 WHT NS (GAUZE/BANDAGES/DRESSINGS) ×1 IMPLANT
TAPE CLOTH SURG 4X10 WHT LF (GAUZE/BANDAGES/DRESSINGS) IMPLANT
TIP APPLICATOR SPRAY EXTEND 16 (VASCULAR PRODUCTS) IMPLANT
TOWEL GREEN STERILE (TOWEL DISPOSABLE) ×1 IMPLANT
TRAY FOLEY MTR SLVR 16FR STAT (SET/KITS/TRAYS/PACK) ×1 IMPLANT
TUBING EXTENTION W/L.L. (IV SETS) ×1 IMPLANT
WATER STERILE IRR 1000ML POUR (IV SOLUTION) ×1 IMPLANT

## 2022-08-10 NOTE — Discharge Instructions (Signed)

## 2022-08-10 NOTE — Brief Op Note (Signed)
08/10/2022  11:27 AM  PATIENT:  Crystal [facility]a  46 y.o. female  PRE-OPERATIVE DIAGNOSIS:  PULMONARY NODULE  POST-OPERATIVE DIAGNOSIS:  ADENOCARCINOMA RIGHT LOWER LOBE  PROCEDURE:   XI ROBOTIC ASSISTED THORACOSCOPY- RIGHT LOWER LOBE WEDGE RESECTION, AND RIGHT LOWER LOBECTOMY   LYMPH NODE DISSECTION (Right)  INTERCOSTAL NERVE BLOCK (Right)  SURGEON:  Lajuana Matte, MD - Primary  PHYSICIAN ASSISTANT: Kelaiah Escalona  ASSISTANTS: [place], Karrie S, RN, Scrub Person    ANESTHESIA:   general  EBL:  50 mL   BLOOD ADMINISTERED:none  DRAINS: Right pleural tube  LOCAL MEDICATIONS USED:  Exparel local and intercostal  SPECIMEN:  RIGHT LOWER LUNG LOBE AND MULTIPLE LYMPH NODES  DISPOSITION OF SPECIMEN:  PATHOLOGY  COUNTS:  correct  DICTATION: .Dragon Dictation  PLAN OF CARE: Admit to inpatient   PATIENT DISPOSITION:  PACU - hemodynamically stable.   Delay start of Pharmacological VTE agent (>24hrs) due to surgical blood loss or risk of bleeding: yes

## 2022-08-10 NOTE — Op Note (Incomplete)
      [address]       [place] [address]             [phone]        08/10/2022  Patient:  [name] Pre-Op Dx: ***   Post-op Dx:  *** Procedure: - Robotic assisted *** video thoracoscopy - *** lobectomy - Mediastinal lymph node sampling - Intercostal nerve block  Surgeon and Role:      * Hayde Kilgour, Lucile Crater, MD - Primary  Assistant: ***, PA-C  An experienced assistant was required given the complexity of this surgery and the standard of surgical care. The assistant was needed for exposure, dissection, suctioning, retraction of delicate tissues and sutures, instrument exchange and for overall help during this procedure.    Anesthesia  general EBL:  *** ml Blood Administration: *** Specimen:  ***, hilar and mediastinal nodes  Drains: 28 F argyle chest tube in *** chest Counts: correct   Indications: *** Findings: ***  Operative Technique: After the risks, benefits and alternatives were thoroughly discussed, the patient was brought to the operative theatre.  Anesthesia was induced, and the patient was then placed in a lateral decubitus position and was prepped and draped in normal sterile fashion.  An appropriate surgical pause was performed, and pre-operative antibiotics were dosed accordingly.  We began by placing our 4 robotic ports in the the 7th intercostal space targeting the hilum of the lung.  A 68mm assistant port was placed in the 9th intercostal space in the anterior axillary line.  The robot was then docked and all instruments were passed under direct visualization.    The lung was then retracted superiorly, and the inferior pulmonary ligament was divided.  The hilum was mobilized anteriorly and posteriorly.  We identified the *** lobe pulmonary vein, and after careful isolation, it was divided with a vascular stapler.  We next moved to the pulmonary artery.  The artery was then divided with a vascular load stapler.  The bronchus to  the *** lobe was then isolated.  After a test clamp, with good ventilation of the remaining lung, the bronchus was then divided.  The fissure was completed, and the specimen was passed into an endocatch bag.  It was removed from the anterior access site.    Lymph nodes were then sampled at hilum and mediastinum.  The chest was irrigated, and an air leak test was performed.  An intercostal nerve block was performed under direct visualization.  A 28 F chest tube was then placed, and we watch the remaining lobes re-expand.  The skin and soft tissue were closed with absorbable suture    The patient tolerated the procedure without any immediate complications, and was transferred to the PACU in stable condition.  Zaire Levesque Bary Leriche

## 2022-08-10 NOTE — Op Note (Signed)
Video Bronchoscopy with Robotic Assisted Bronchoscopic Navigation   Date of Operation: 08/10/2022   Pre-op Diagnosis: Lung nodule   Post-op Diagnosis: Lung nodule   Surgeon: Garner Nash, DO   Assistants: None   Anesthesia: General endotracheal anesthesia  Operation: Flexible video fiberoptic bronchoscopy with robotic assistance and biopsies.  Estimated Blood Loss: Minimal  Complications: None  Indications and History: Crystal [facility] is a 46 y.o. female with history of RLL lung nodule, tobacco use. The risks, benefits, complications, treatment options and expected outcomes were discussed with the patient.  The possibilities of pneumothorax, pneumonia, reaction to medication, pulmonary aspiration, perforation of a viscus, bleeding, failure to diagnose a condition and creating a complication requiring transfusion or operation were discussed with the patient who freely signed the consent.    Description of Procedure: The patient was seen in the Preoperative Area, was examined and was deemed appropriate to proceed.  The patient was taken to [facility] endoscopy room 3, identified as [name] and the procedure verified as Flexible Video Fiberoptic Bronchoscopy.  A Time Out was held and the above information confirmed.   Prior to the date of the procedure a high-resolution CT scan of the chest was performed. Utilizing ION software program a virtual tracheobronchial tree was generated to allow the creation of distinct navigation pathways to the patient's parenchymal abnormalities. After being taken to the operating room general anesthesia was initiated and the patient  was orally intubated. The video fiberoptic bronchoscope was introduced via the endotracheal tube and a general inspection was performed which showed normal right and left lung anatomy, aspiration of the bilateral mainstems was completed to remove any remaining secretions. Robotic catheter inserted into patient's endotracheal  tube.   Target #1 RLL nodule: The distinct navigation pathways prepared prior to this procedure were then utilized to navigate to patient's lesion identified on CT scan. The robotic catheter was secured into place and the vision probe was withdrawn.  Lesion location was approximated using fluoroscopy and 3D CBCT for peripheral targeting. Under fluoroscopic guidance transbronchial needle brushings, transbronchial needle biopsies, and transbronchial forceps biopsies were performed to be sent for cytology and pathology.  Following tissue sampling we completed a fiducial dye mark within the lesion using a 50% / 50% mixture of methylene blue and ICG.  We instilled approximately 1 cc of dye marking solution  At the end of the procedure a general airway inspection was performed and there was no evidence of active bleeding. The bronchoscope was removed.  The patient tolerated the procedure well. There was no significant blood loss and there were no obvious complications. A post-procedural chest x-ray is pending.  Samples Target #1: 1. Transbronchial needle brushings from RLL 2. Transbronchial Wang needle biopsies from RLL 3. Transbronchial forceps biopsies from RLL  Plans:  Based on the results of the robotic assisted navigational bronchoscopy the decision was made to keep patient asleep transferred to the operating room under the care of Dr. Kipp Brood with cardiothoracic surgery for possible robotic assisted wedge resection and possible lobectomy.  Garner Nash, DO [facility] 08/10/2022 8:28 AM

## 2022-08-10 NOTE — Anesthesia Postprocedure Evaluation (Signed)
Anesthesia Post Note  Patient: Crystal [facility]a  Procedure(s) Performed: ROBOTIC ASSISTED NAVIGATIONAL BRONCHOSCOPY FIDUCIAL dye marker BRONCHIAL BRUSHINGS BRONCHIAL NEEDLE ASPIRATION BIOPSIES     Patient location during evaluation: PACU Anesthesia Type: General Level of consciousness: sedated Pain management: pain level controlled Vital Signs Assessment: post-procedure vital signs reviewed and stable Respiratory status: spontaneous breathing and respiratory function stable Cardiovascular status: stable Postop Assessment: no apparent nausea or vomiting Anesthetic complications: no   No notable events documented.  Last Vitals:  Vitals:   08/10/22 1245 08/10/22 1310  BP: (!) 116/99 (!) 152/93  Pulse: 85 87  Resp: 12 18  Temp:    SpO2: 98% 100%    Last Pain:  Vitals:   08/10/22 1215  TempSrc:   PainSc: 6                  Ilia Engelbert DANIEL

## 2022-08-10 NOTE — Discharge Summary (Incomplete)
Physician Discharge Summary  Patient ID: Crystal [facility]a MRN: [id number] DOB/AGE: [birth year] 46 y.o.  Admit date: 08/10/2022 Discharge date: 08/10/2022  Admission Diagnoses:  Pulmonary nodule right lower lung lobe History of depression History of bipolar disorder Gastroesophageal reflux disease Hypertension Dyslipidemia Discharge Diagnoses:   Adenocarcinoma right lower lung lobe Status post robotic assisted right lower lobectomy with mediastinal lymph   node dissection History of depression History of bipolar disorder Gastroesophageal reflux disease Hypertension Dyslipidemia   Discharged Condition: {condition:18240}  Referring: Crystal Nash, DO Primary Care: Crystal Devoid, PA-C  History of Present Illness:    Crystal [facility]a 46 y.o. female presents for surgical evaluation of a 1.4 cm right lower lobe cavitary lesion that was found incidentally in May 2023.  She was treated for liver abscess.  Subsequent imaging has shown persistent in this nodule.  She also underwent a PET/CT which showed minimal uptake.   She denies any respiratory symptoms.  She continues to vape and use cigars.  She denies any weight changes or neurologic symptoms.  This right lower lobe cavitary pulmonary nodule concerning for small cell lung cancer.  We discussed the importance of smoking cessation, and she has agreed to stop.  We also covered the risks and benefits of navigational bronchoscopy followed a right robotic assisted thoracoscopy with wedge resection and possible right mediastinal and intercostal nerve block with Exparel.  He tolerated the procedure well.  Following surgery, she was as a single anesthetic event.  She would like to proceed, and I will coordinate timing with Crystal [facility].   Preoperative stress test was negative for myocardial ischemia.  There was no ST deviation noted.  Left ventricular function was normal with an estimated ejection fraction of 62%.  [facility]  Course: Crystal [facility] was admitted to the [facility] for elective surgery on 08/10/2022.  She was taken to the operative room where robotic assisted right lower lobe wedge resection was carried out.  The wedge segment was sent to the pathology lab where it was confirmed to be adenocarcinoma. We then proceeded with robotic assisted right lower lobectomy and mediastinal lymph node dissection.  Following the procedure, patient was transferred to the postanesthesia care unit in stable condition.  Consults: {consultation:18241}  Significant Diagnostic Studies: {diagnostics:18242}  Treatments: {Tx:18249}  Discharge Exam: Blood pressure 131/88, pulse 85, temperature 97.9 F (36.6 C), resp. rate 12, height 5\' 4"  (1.626 m), weight 81.2 kg, last menstrual period 02/13/2020, SpO2 98 %. {physical DIYM:4158309}  Disposition:    Allergies as of 08/10/2022       Reactions   Biaxin [clarithromycin] Hives   Welts    Morphine And Related Hives   Says it is okay with benadryl administration   Percocet [oxycodone-acetaminophen] Hives, Itching   Shrimp [shellfish Allergy]    Celexa [citalopram] Anxiety   aggression   Topamax [topiramate] Rash     Med Rec must be completed prior to using this [facility]***       Follow-up Information     Crystal Matte, MD. Go to.   Specialty: Cardiothoracic Surgery Why: Your appointment is at *** Please report to [facility] at [address] 1 hour prior to the appointment time for a chest x-ray Contact information: [address] [place] [facility] [address] [phone]         Crystal [facility]a. Go to.   Contact information: Crystal [facility]  Signed: Antony [facility] 08/10/2022, 12:08 PM

## 2022-08-10 NOTE — Hospital Course (Signed)
Referring: Garner Nash, DO Primary Care: Kathreen Devoid, PA-C  History of Present Illness:    Crystal [facility]a 46 y.o. female presents for surgical evaluation of a 1.4 cm right lower lobe cavitary lesion that was found incidentally in May 2023.  She was treated for liver abscess.  Subsequent imaging has shown persistent in this nodule.  She also underwent a PET/CT which showed minimal uptake.   She denies any respiratory symptoms.  She continues to vape and use cigars.  She denies any weight changes or neurologic symptoms.  This right lower lobe cavitary pulmonary nodule concerning for small cell lung cancer.  We discussed the importance of smoking cessation, and she has agreed to stop.  We also covered the risks and benefits of navigational bronchoscopy followed a right robotic assisted thoracoscopy with wedge resection and possible right mediastinal and intercostal nerve block with Exparel.  He tolerated the procedure well.  Following surgery, she was as a single anesthetic event.  She would like to proceed, and I will coordinate timing with Dr. Valeta Harms.   Preoperative stress test was negative for myocardial ischemia.  There was no ST deviation noted.  Left ventricular function was normal with an estimated ejection fraction of 62%.  Hospital Course: Mr. Travieso was admitted to the hospital for elective surgery on 08/10/2022.  She was taken to the operative room where robotic assisted right lower lobe wedge resection was carried out.  The wedge segment was sent to the pathology lab where it was confirmed to be adenocarcinoma. We then proceeded with robotic assisted right lower lobectomy and mediastinal lymph node dissection.  Following the procedure, patient was transferred to the postanesthesia care unit in stable condition.

## 2022-08-10 NOTE — Interval H&P Note (Signed)
History and Physical Interval Note:  08/10/2022 8:02 AM  Crystal Mora  has presented today for surgery, with the diagnosis of PULMONARY NODULE.  The various methods of treatment have been discussed with the patient and family. After consideration of risks, benefits and other options for treatment, the patient has consented to  Procedure(s): XI ROBOTIC [place], possible lobectomy (Right) as a surgical intervention.  The patient's history has been reviewed, patient examined, no change in status, stable for surgery.  I have reviewed the patient's chart and labs.  Questions were answered to the patient's satisfaction.     [name]

## 2022-08-10 NOTE — Transfer of Care (Signed)
Immediate Anesthesia Transfer of Care Note  Patient: Crystal Mora  Procedure(s) Performed: ROBOTIC ASSISTED NAVIGATIONAL BRONCHOSCOPY FIDUCIAL dye marker BRONCHIAL BRUSHINGS BRONCHIAL NEEDLE ASPIRATION BIOPSIES  Patient Location: PACU  Anesthesia Type:General  Level of Consciousness: awake, alert , and oriented  Airway & Oxygen Therapy: Patient Spontanous Breathing and Patient connected to face mask oxygen  Post-op Assessment: Report given to RN, Post -op Vital signs reviewed and stable, and Patient moving all extremities X 4  Post vital signs: Reviewed and stable  Last Vitals:  Vitals Value Taken Time  BP 139/96 08/10/22 1149  Temp 36.6 C 08/10/22 1149  Pulse 88 08/10/22 1156  Resp 9 08/10/22 1156  SpO2 96 % 08/10/22 1156  Vitals shown include unvalidated device data.  Last Pain:  Vitals:   08/10/22 0553  TempSrc:   PainSc: 0-No pain         Complications: No notable events documented.

## 2022-08-10 NOTE — Anesthesia Procedure Notes (Addendum)
Procedure Name: Intubation Date/Time: 08/10/2022 7:44 AM  Performed by: Kyung Rudd, CRNAPre-anesthesia Checklist: Patient identified, Emergency Drugs available, Suction available and Patient being monitored Patient Re-evaluated:Patient Re-evaluated prior to induction Oxygen Delivery Method: Circle System Utilized Preoxygenation: Pre-oxygenation with 100% oxygen Induction Type: IV induction Ventilation: Mask ventilation without difficulty Laryngoscope Size: Mac and 3 Grade View: Grade I Tube type: Oral Tube size: 8.5 mm Number of attempts: 1 Airway Equipment and Method: Stylet and Oral airway Placement Confirmation: ETT inserted through vocal cords under direct vision, positive ETCO2 and breath sounds checked- equal and bilateral Secured at: 21 cm Tube secured with: Tape Dental Injury: Teeth and Oropharynx as per pre-operative assessment

## 2022-08-10 NOTE — H&P (Signed)
Synopsis: Referred in November 2023 for right lower lobe pulmonary nodule by Mignon Pine, DO   Subjective:    PATIENT ID: Crystal Mora GENDER: female DOB: [birth year], MRN: [id number]       Chief Complaint  Patient presents with   Consult      Lung nodules      This is a 46 year old female, past medical history of bipolar disease, diabetes, hypertension.  She is longstanding history of tobacco use since she was a teenager.  Current every day smoker smokes cigarettes cigars and vapes.  Currently using 2 cigars every other day.  She is trying to quit smoking.  She was recently admitted to the hospital with liver abscess that required drainage.  She had a nodule found in the right lower lobe of the lung in May 2023.  She had follow-up CT imaging that shows persistence of of the lesion in October 2023.  She has a family history of lung cancer.  2 uncles and an aunt that have lung cancer.  She also had a 40 year old cousin that had kidney cancer..  08/10/2022: Patient is agreeable to proceed. Here today for RAB + RAT.            Past Medical History:  Diagnosis Date   Anxiety     Bipolar 1 disorder ([place])     Chronic urticaria     Depression     Diabetes mellitus without complication ([place])     Hypertension      states she has not took BP medication for 2 years   Insomnia     Neuromuscular disorder ([place])      back   Thyroid disease             Family History  Problem Relation Age of Onset   Depression Mother     Mental illness Mother     Asthma Mother     COPD Mother     Diabetes Mother     Mental illness Brother     Mental illness Daughter     Diabetes Maternal Aunt     Mental illness Maternal Aunt     Depression Maternal Aunt     Diabetes Maternal Grandmother             Past Surgical History:  Procedure Laterality Date   CESAREAN SECTION       IR RADIOLOGIST EVAL & MGMT   01/18/2022   THYROID SURGERY       TUBAL LIGATION       tubal reversal Bilateral         Social History         Socioeconomic History   Marital status: Divorced      Spouse name: Not on file   Number of children: Not on file   Years of education: Not on file   Highest education level: Not on file  Occupational History   Not on file  Tobacco Use   Smoking status: Every Day      Packs/day: 0.50      Types: Cigars, Cigarettes   Smokeless tobacco: Never   Tobacco comments:      Smokes 1-2 black and Mild every other day and vape every day. 06/14/2022 Tay  Vaping Use   Vaping Use: Every day   Substances: Flavoring  Substance and Sexual Activity   Alcohol use: Not Currently      Comment: Rare   Drug use: No   Sexual  activity: Yes      Birth control/protection: Surgical  Other Topics Concern   Not on file  Social History Narrative   Not on file    Social Determinants of Health    Financial Resource Strain: Not on file  Food Insecurity: Not on file  Transportation Needs: Not on file  Physical Activity: Not on file  Stress: Not on file  Social Connections: Not on file  Intimate Partner Violence: Not on file           Allergies  Allergen Reactions   Biaxin [Clarithromycin]     Morphine And Related Hives      Says it is okay with benadryl administration   Percocet [Oxycodone-Acetaminophen] Hives and Itching   Shrimp [Shellfish Allergy]     Citalopram Anxiety      aggression   Topiramate Rash            Outpatient Medications Prior to Visit  Medication Sig Dispense Refill   cetirizine (ZYRTEC) 10 MG tablet Take 10 mg by mouth daily.       amLODipine (NORVASC) 10 MG tablet Take 1 tablet by mouth daily.       Aspirin 81 MG CAPS Take by mouth.       diazepam (VALIUM) 5 MG tablet Take 5 mg by mouth in the morning, at noon, and at bedtime.       EPINEPHrine (EPIPEN 2-PAK) 0.3 mg/0.3 mL IJ SOAJ injection Inject 0.3 mLs (0.3 mg total) into the muscle once as needed for up to 1 dose (for severe allergic reaction). CAll 911 immediately if you have to use  this medicine 1 Device 1   gabapentin (NEURONTIN) 300 MG capsule Take 300 mg by mouth daily as needed (For feet pain). (Patient not taking: Reported on 05/31/2022)       HYDROcodone-acetaminophen (NORCO/VICODIN) 5-325 MG tablet Take 0.5-1 tablets by mouth 3 (three) times daily as needed. 7.5 mg per patient       hydrOXYzine (ATARAX/VISTARIL) 25 MG tablet Take 1 every 4-6 hours as needed for itch or hives. (Caution: May cause drowsiness) (Patient not taking: Reported on 04/26/2022) 60 tablet 0   methocarbamol (ROBAXIN) 500 MG tablet Take 1 tablet (500 mg total) by mouth 2 (two) times daily. (Patient not taking: Reported on 05/31/2022) 20 tablet 0   pantoprazole (PROTONIX) 40 MG tablet Take 1 tablet (40 mg total) by mouth 2 (two) times daily. (Patient not taking: Reported on 05/31/2022) 60 tablet 0   RESTASIS 0.05 % ophthalmic emulsion Place 1 drop into both eyes daily as needed for dry eyes.       rosuvastatin (CRESTOR) 5 MG tablet Take 5 mg by mouth daily.       sucralfate (CARAFATE) 1 GM/10ML suspension Take 10 mLs (1 g total) by mouth 4 (four) times daily -  with meals and at bedtime. (Patient not taking: Reported on 05/31/2022) 420 mL 0   VITAMIN D PO Take 1 tablet by mouth daily.        No facility-administered medications prior to visit.      Review of Systems  Constitutional:  Negative for chills, fever, malaise/fatigue and weight loss.  HENT:  Negative for hearing loss, sore throat and tinnitus.   Eyes:  Negative for blurred vision and double vision.  Respiratory:  Negative for cough, hemoptysis, sputum production, shortness of breath, wheezing and stridor.   Cardiovascular:  Negative for chest pain, palpitations, orthopnea, leg swelling and PND.  Gastrointestinal:  Negative for abdominal  pain, constipation, diarrhea, heartburn, nausea and vomiting.  Genitourinary:  Negative for dysuria, hematuria and urgency.  Musculoskeletal:  Negative for joint pain and myalgias.  Skin:  Negative for  itching and rash.  Neurological:  Negative for dizziness, tingling, weakness and headaches.  Endo/Heme/Allergies:  Negative for environmental allergies. Does not bruise/bleed easily.  Psychiatric/Behavioral:  Negative for depression. The patient is not nervous/anxious and does not have insomnia.   All other systems reviewed and are negative.       Objective:   General appearance: 46 y.o., female, NAD, conversant  Eyes: anicteric sclerae, moist conjunctiva HENT: NCAT; oropharynx, MMM Neck: Trachea midline; FROM, supple, lymphadenopathy, no JVD Lungs: CTAB, no crackles, no wheeze CV: RRR, S1, S2, no MRGs  Abdomen: Soft, non-tender; non-distended, BS present  Extremities: No peripheral edema, radial and DP pulses present bilaterally  Skin: Normal temperature, turgor and texture; no rash Psych: Appropriate affect Neuro: Alert and oriented to person and place, no focal deficit   BP (!) 157/98   Pulse 79   Temp 97.8 F (36.6 C) (Oral)   Resp 18   Ht 5\' 4"  (1.626 m)   Wt 81.2 kg   LMP 02/13/2020 (Approximate)   SpO2 99%   BMI 30.73 kg/m     CBC Labs (Brief)          Component Value Date/Time    WBC 6.4 04/27/2022 0105    RBC 4.90 04/27/2022 0105    HGB 13.8 04/27/2022 0105    HCT 41.7 04/27/2022 0105    PLT 221 04/27/2022 0105    MCV 85.1 04/27/2022 0105    MCH 28.2 04/27/2022 0105    MCHC 33.1 04/27/2022 0105    RDW 14.0 04/27/2022 0105    LYMPHSABS 2.1 04/27/2022 0105    MONOABS 0.4 04/27/2022 0105    EOSABS 0.2 04/27/2022 0105    BASOSABS 0.0 04/27/2022 0105          Chest Imaging: October 2023: Increased size of a 14 x 9 mm subpleural cavitary nodule.  Posterior lateral right lower lobe. The patient's images have been independently reviewed by me.     Pulmonary Functions Testing Results:       No data to display             FeNO:    Pathology:    Echocardiogram:    Heart Catheterization:     Assessment & Plan:        ICD-10-CM    1. Lung  nodule  R91.1 NM PET Image Initial (PI) Skull Base To Thigh (F-18 FDG)      Ambulatory referral to Cardiothoracic Surgery      Pulmonary Function Test     2. Cavitary lesion of lung  J98.4           Discussion:   This is a 46 year old female, lung nodule in the right lower lobe, cavitary lesion.  Longstanding history of tobacco use and family history of lung cancer.   Plan: Here today for planned combined procedure RAB + RATs Discessed risks and benefits  Patient is agreeable  Garner Nash, DO [facility] 08/10/2022 7:30 AM

## 2022-08-11 ENCOUNTER — Inpatient Hospital Stay (HOSPITAL_COMMUNITY): Payer: Medicare Other

## 2022-08-11 LAB — BASIC METABOLIC PANEL
Anion gap: 10 (ref 5–15)
BUN: 15 mg/dL (ref 6–20)
CO2: 24 mmol/L (ref 22–32)
Calcium: 8.6 mg/dL — ABNORMAL LOW (ref 8.9–10.3)
Chloride: 106 mmol/L (ref 98–111)
Creatinine, Ser: 0.74 mg/dL (ref 0.44–1.00)
GFR, Estimated: >60 mL/min (ref >60)
Glucose, Bld: 156 mg/dL — ABNORMAL HIGH (ref 70–99)
Potassium: 3.7 mmol/L (ref 3.5–5.1)
Sodium: 140 mmol/L (ref 135–145)

## 2022-08-11 LAB — CBC
HCT: 40.6 % (ref 36.0–46.0)
Hemoglobin: 13.5 g/dL (ref 12.0–15.0)
MCH: 29 pg (ref 26.0–34.0)
MCHC: 33.3 g/dL (ref 30.0–36.0)
MCV: 87.3 fL (ref 80.0–100.0)
Platelets: 229 10*3/uL (ref 150–400)
RBC: 4.65 MIL/uL (ref 3.87–5.11)
RDW: 14.4 % (ref 11.5–15.5)
WBC: 13.1 10*3/uL — ABNORMAL HIGH (ref 4.0–10.5)
nRBC: 0 % (ref 0.0–0.2)

## 2022-08-11 MED ORDER — HYDROCODONE-ACETAMINOPHEN 7.5-325 MG PO TABS
1.0000 | ORAL_TABLET | Freq: Four times a day (QID) | ORAL | Status: DC | PRN
  Administered 2022-08-11 – 2022-08-12 (×2): 2 via ORAL
  Administered 2022-08-12: 1 via ORAL
  Administered 2022-08-12 – 2022-08-13 (×3): 2 via ORAL
  Administered 2022-08-13: 1 via ORAL
  Administered 2022-08-14: 2 via ORAL
  Filled 2022-08-11 (×2): qty 1
  Filled 2022-08-11 (×6): qty 2

## 2022-08-11 MED ORDER — ZOLPIDEM TARTRATE 5 MG PO TABS
5.0000 mg | ORAL_TABLET | Freq: Every day | ORAL | Status: DC
  Administered 2022-08-11 – 2022-08-13 (×3): 5 mg via ORAL
  Filled 2022-08-11 (×3): qty 1

## 2022-08-11 MED ORDER — GUAIFENESIN ER 600 MG PO TB12: 600.0000 mg | ORAL_TABLET | Freq: Two times a day (BID) | ORAL | Status: DC | PRN

## 2022-08-11 MED ORDER — ACETAMINOPHEN 325 MG PO TABS: 650.0000 mg | ORAL_TABLET | Freq: Four times a day (QID) | ORAL | Status: DC | PRN

## 2022-08-11 MED ORDER — KETOROLAC TROMETHAMINE 15 MG/ML IJ SOLN
30.0000 mg | Freq: Four times a day (QID) | INTRAMUSCULAR | Status: AC
  Administered 2022-08-11 – 2022-08-12 (×3): 30 mg via INTRAVENOUS
  Filled 2022-08-11 (×3): qty 2

## 2022-08-11 MED ORDER — KETOROLAC TROMETHAMINE 15 MG/ML IJ SOLN
15.0000 mg | Freq: Once | INTRAMUSCULAR | Status: AC
  Administered 2022-08-11: 15 mg via INTRAVENOUS
  Filled 2022-08-11: qty 1

## 2022-08-11 NOTE — Progress Notes (Signed)
Mobility Specialist - Progress Note   08/11/22 1000  Mobility  Activity Ambulated with assistance in hallway  Level of Assistance Standby assist, set-up cues, supervision of patient - no hands on  Assistive Device None  Distance Ambulated (ft) 300 ft  Activity Response Tolerated well  Mobility Referral Yes  $Mobility charge 1 Mobility    Pt received in bed agreeable to mobility. No complaints throughout, no physical assistance needed. Left in bed w/ call bell at her side and all needs met.   Post-mobility: HR 87 bpm, SpO2 97%  Paulla Dolly Mobility Specialist Please contact via Solicitor or Rehab office at [phone]

## 2022-08-11 NOTE — Progress Notes (Addendum)
      [address]       [place] [address]             [phone]      1 Day Post-Op Procedure(s) (LRB): XI ROBOTIC ASSISTED THORACOSCOPY- RIGHT LOWER LOBE WEDGE RESECTION, AND RIGHT LOWER LOBECTOMY (Right) LYMPH NODE DISSECTION (Right) INTERCOSTAL NERVE BLOCK (Right)  Subjective:  Patient states she feels horrible.  Her pain is not well controlled and she feels congested  Objective: Vital signs in last 24 hours: Temp:  [97.6 F (36.4 C)-98.4 F (36.9 C)] 97.9 F (36.6 C) (12/30 0806) Pulse Rate:  [78-92] 78 (12/30 0806) Cardiac Rhythm: Normal sinus rhythm (12/30 0704) Resp:  [12-19] 19 (12/30 0806) BP: (116-161)/(77-100) 137/85 (12/30 0806) SpO2:  [93 %-100 %] 98 % (12/30 0806)  Intake/Output from previous day: 12/29 0701 - 12/30 0700 In: 2187.5 [P.O.:360; I.V.:1527.5; IV Piggyback:300] Out: 945 [Urine:625; Blood:50; Chest Tube:270]  General appearance: alert, cooperative, and no distress Heart: regular rate and rhythm Lungs: clear to auscultation bilaterally Abdomen: soft, non-tender; bowel sounds normal; no masses,  no organomegaly Extremities: extremities normal, atraumatic, no cyanosis or edema Wound: clean and dry  Lab Results: Recent Labs    08/08/22 1400 08/11/22 0008  WBC 7.5 13.1*  HGB 14.4 13.5  HCT 45.1 40.6  PLT 256 229   BMET:  Recent Labs    08/08/22 1400 08/11/22 0008  NA 140 140  K 4.1 3.7  CL 104 106  CO2 31 24  GLUCOSE 103* 156*  BUN 25* 15  CREATININE 1.04* 0.74  CALCIUM 9.6 8.6*    PT/INR:  Recent Labs    08/08/22 1400  LABPROT 12.7  INR 1.0   ABG No results found for: "PHART", "HCO3", "TCO2", "ACIDBASEDEF", "O2SAT" CBG (last 3)  Recent Labs    08/10/22 1151  GLUCAP 143*    Assessment/Plan: S/P Procedure(s) (LRB): XI ROBOTIC ASSISTED THORACOSCOPY- RIGHT LOWER LOBE WEDGE RESECTION, AND RIGHT LOWER LOBECTOMY (Right) LYMPH NODE DISSECTION (Right) INTERCOSTAL NERVE BLOCK (Right)  CV- NSR, H/O  HTN- continue Cozaar, Norvasc Pulm- wean oxygen as tolerated, CT with 270 cc output, + air leak.. leave on water seal today.Marland Kitchen CXR w/o pneumothorax Renal- creatinine, lytes are okay Pain control- increase Norco to home dose, increase Toradol to 30 mg, continue IV prn Insomnia- states she didn't sleep at all last night, is very tired, will add [employer]- plan as above, leave chest tube on water seal, repeat CXR in AM   LOS: 1 day    Ellwood Handler, PA-C 08/11/2022   Agree with above + leak IS, ambulation  Saharra Santo O Tarrie Mcmichen

## 2022-08-12 ENCOUNTER — Inpatient Hospital Stay (HOSPITAL_COMMUNITY): Payer: Medicare Other

## 2022-08-12 ENCOUNTER — Encounter (HOSPITAL_COMMUNITY): Payer: Self-pay | Admitting: Pulmonary Disease

## 2022-08-12 LAB — COMPREHENSIVE METABOLIC PANEL
ALT: 22 U/L (ref 0–44)
AST: 24 U/L (ref 15–41)
Albumin: 3.2 g/dL — ABNORMAL LOW (ref 3.5–5.0)
Alkaline Phosphatase: 70 U/L (ref 38–126)
Anion gap: 6 (ref 5–15)
BUN: 12 mg/dL (ref 6–20)
CO2: 27 mmol/L (ref 22–32)
Calcium: 8.4 mg/dL — ABNORMAL LOW (ref 8.9–10.3)
Chloride: 106 mmol/L (ref 98–111)
Creatinine, Ser: 0.81 mg/dL (ref 0.44–1.00)
GFR, Estimated: >60 mL/min (ref >60)
Glucose, Bld: 140 mg/dL — ABNORMAL HIGH (ref 70–99)
Potassium: 3.6 mmol/L (ref 3.5–5.1)
Sodium: 139 mmol/L (ref 135–145)
Total Bilirubin: 0.1 mg/dL — ABNORMAL LOW (ref 0.3–1.2)
Total Protein: 6.3 g/dL — ABNORMAL LOW (ref 6.5–8.1)

## 2022-08-12 LAB — CBC
HCT: 40.6 % (ref 36.0–46.0)
Hemoglobin: 12.7 g/dL (ref 12.0–15.0)
MCH: 28.2 pg (ref 26.0–34.0)
MCHC: 31.3 g/dL (ref 30.0–36.0)
MCV: 90.2 fL (ref 80.0–100.0)
Platelets: 217 10*3/uL (ref 150–400)
RBC: 4.5 MIL/uL (ref 3.87–5.11)
RDW: 14.6 % (ref 11.5–15.5)
WBC: 8.3 10*3/uL (ref 4.0–10.5)
nRBC: 0 % (ref 0.0–0.2)

## 2022-08-12 MED ORDER — KETOROLAC TROMETHAMINE 30 MG/ML IJ SOLN
30.0000 mg | Freq: Four times a day (QID) | INTRAMUSCULAR | Status: DC
  Administered 2022-08-12 – 2022-08-14 (×8): 30 mg via INTRAVENOUS
  Filled 2022-08-12 (×8): qty 1

## 2022-08-12 MED ORDER — FENTANYL CITRATE PF 50 MCG/ML IJ SOSY
50.0000 ug | PREFILLED_SYRINGE | INTRAMUSCULAR | Status: DC | PRN
  Administered 2022-08-13: 75 ug via INTRAVENOUS
  Administered 2022-08-13: 50 ug via INTRAVENOUS
  Administered 2022-08-14: 75 ug via INTRAVENOUS
  Filled 2022-08-12 (×2): qty 2
  Filled 2022-08-12 (×2): qty 1

## 2022-08-12 NOTE — Progress Notes (Signed)
Mobility Specialist - Progress Note   08/12/22 1100  Mobility  Activity Ambulated with assistance in hallway  Level of Assistance Standby assist, set-up cues, supervision of patient - no hands on  Assistive Device None  Distance Ambulated (ft) 300 ft  Activity Response Tolerated well  $Mobility charge 1 Mobility    Pt received in BR agreeable to mobility. No complaints throughout. Left in bed w/ call bell in reach and all needs met.   Post-mobility: HR 114 bpm, SpO2 95%  [place] Specialist Please contact via Solicitor or Rehab office at [phone]

## 2022-08-12 NOTE — Progress Notes (Addendum)
      [address]       [place] [address]             [phone]      2 Days Post-Op Procedure(s) (LRB): XI ROBOTIC ASSISTED THORACOSCOPY- RIGHT LOWER LOBE WEDGE RESECTION, AND RIGHT LOWER LOBECTOMY (Right) LYMPH NODE DISSECTION (Right) INTERCOSTAL NERVE BLOCK (Right)  Subjective:  Patient had rough day yesterday.  States her pain medication was all messed up and it was difficult to receive any.  She did sleep last night with help of the [place].  She ambulated several times.  Using IS  Objective: Vital signs in last 24 hours: Temp:  [97.8 F (36.6 C)-98.9 F (37.2 C)] 98.9 F (37.2 C) (12/31 0410) Pulse Rate:  [84-108] 108 (12/31 0410) Cardiac Rhythm: Normal sinus rhythm (12/30 1954) Resp:  [15-24] 18 (12/31 0410) BP: (132-138)/(78-85) 134/79 (12/31 0410) SpO2:  [94 %-97 %] 94 % (12/31 0410)  Intake/Output from previous day: 12/30 0701 - 12/31 0700 In: 240 [P.O.:240] Out: 175 [Chest Tube:175]  General appearance: alert, cooperative, and no distress Heart: regular rate and rhythm Lungs: clear to auscultation bilaterally Abdomen: soft, non-tender; bowel sounds normal; no masses,  no organomegaly Extremities: extremities normal, atraumatic, no cyanosis or edema Wound: clean and dry  Lab Results: Recent Labs    08/11/22 0008 08/12/22 0013  WBC 13.1* 8.3  HGB 13.5 12.7  HCT 40.6 40.6  PLT 229 217   BMET:  Recent Labs    08/11/22 0008 08/12/22 0013  NA 140 139  K 3.7 3.6  CL 106 106  CO2 24 27  GLUCOSE 156* 140*  BUN 15 12  CREATININE 0.74 0.81  CALCIUM 8.6* 8.4*    PT/INR: No results for input(s): "LABPROT", "INR" in the last 72 hours. ABG No results found for: "PHART", "HCO3", "TCO2", "ACIDBASEDEF", "O2SAT" CBG (last 3)  Recent Labs    08/10/22 1151  GLUCAP 143*    Assessment/Plan: S/P Procedure(s) (LRB): XI ROBOTIC ASSISTED THORACOSCOPY- RIGHT LOWER LOBE WEDGE RESECTION, AND RIGHT LOWER LOBECTOMY (Right) LYMPH NODE  DISSECTION (Right) INTERCOSTAL NERVE BLOCK (Right)  CV- NSR, H/O HTN, controlled- continue Cozaar, Norvasc Pulm- CT on water seal, + air leak, CXR with apical space/pneumothorax- leave in place today Renal- creatinine,lytes are normal.. will resume Toradol  HLD- on crestor Insomnia- relief with ambien, continue Dispo- patient stable, pain control an issue yesterday I have spoke with patient and nurse on plan for management, CT with air leak leave in place today, continue current care   LOS: 2 days    Ellwood Handler, PA-C 08/12/2022  Agree with above Continue IS, and ambulation  Lachell Rochette O Anu Stagner

## 2022-08-13 ENCOUNTER — Inpatient Hospital Stay (HOSPITAL_COMMUNITY): Payer: Medicare Other

## 2022-08-13 MED ORDER — ACETAMINOPHEN 325 MG PO TABS: 650.0000 mg | ORAL_TABLET | Freq: Four times a day (QID) | ORAL | Status: AC | PRN

## 2022-08-13 NOTE — TOC Progression Note (Signed)
Transition of Care [facility]) - Progression Note    Patient Details  Name: Crystal Mora MRN: [id number] Date of Birth: December 10, 1975  Transition of Care [facility]) CM/SW Contact  Zenon Mayo, RN Phone Number: 08/13/2022, 9:52 AM  Clinical Narrative:     From home, POD 3 robotic lobectomy, chest tube for possible removal today.  TOC following.        Expected Discharge Plan and Services                                               Social Determinants of Health (SDOH) Interventions SDOH Screenings   Depression (PHQ2-9): Low Risk  (01/23/2022)  Tobacco Use: Medium Risk (08/12/2022)    Readmission Risk Interventions    01/03/2022    2:44 PM  Readmission Risk Prevention Plan  Transportation Screening Complete  PCP or Specialist Appt within 5-7 Days Complete

## 2022-08-13 NOTE — Progress Notes (Signed)
      [address]       [place] [address]             [phone]      3 Days Post-Op Procedure(s) (LRB): XI ROBOTIC ASSISTED THORACOSCOPY- RIGHT LOWER LOBE WEDGE RESECTION, AND RIGHT LOWER LOBECTOMY (Right) LYMPH NODE DISSECTION (Right) INTERCOSTAL NERVE BLOCK (Right)  Subjective:  Patient had a better day yesterday, pain control was much better.  + ambulation    Objective: Vital signs in last 24 hours: Temp:  [97.9 F (36.6 C)-98.8 F (37.1 C)] 98.8 F (37.1 C) (01/01 0404) Pulse Rate:  [84-104] 100 (01/01 0404) Cardiac Rhythm: Normal sinus rhythm (12/31 1905) Resp:  [17-23] 19 (01/01 0404) BP: (126-142)/(78-92) 126/81 (01/01 0404) SpO2:  [93 %-96 %] 96 % (01/01 0404)  Intake/Output from previous day: 12/31 0701 - 01/01 0700 In: 960 [P.O.:960] Out: 1060 [Urine:750; Chest Tube:310]  General appearance: alert, cooperative, and no distress Heart: regular rate and rhythm Lungs: clear to auscultation bilaterally Abdomen: soft, non-tender; bowel sounds normal; no masses,  no organomegaly Extremities: extremities normal, atraumatic, no cyanosis or edema Wound: clean and dry  Lab Results: Recent Labs    08/11/22 0008 08/12/22 0013  WBC 13.1* 8.3  HGB 13.5 12.7  HCT 40.6 40.6  PLT 229 217   BMET:  Recent Labs    08/11/22 0008 08/12/22 0013  NA 140 139  K 3.7 3.6  CL 106 106  CO2 24 27  GLUCOSE 156* 140*  BUN 15 12  CREATININE 0.74 0.81  CALCIUM 8.6* 8.4*    PT/INR: No results for input(s): "LABPROT", "INR" in the last 72 hours. ABG No results found for: "PHART", "HCO3", "TCO2", "ACIDBASEDEF", "O2SAT" CBG (last 3)  Recent Labs    08/10/22 1151  GLUCAP 143*    Assessment/Plan: S/P Procedure(s) (LRB): XI ROBOTIC ASSISTED THORACOSCOPY- RIGHT LOWER LOBE WEDGE RESECTION, AND RIGHT LOWER LOBECTOMY (Right) LYMPH NODE DISSECTION (Right) INTERCOSTAL NERVE BLOCK (Right)  CV- NSR, BP stable- continue Cozaar, Norvasc 2.   Pulm-CT on  water seal, initially air leak with cough, then resolved.. ? Remove CT vs. Clamping trial.. CXR pending 3. HLD on crestor 4. Insomnia- continue ambien 5. Dispo- patient stable, doing well.. possibly d/c chest tube today, will discuss with Dr. Kipp Brood  LOS: 3 days    Ellwood Handler, PA-C 08/13/2022

## 2022-08-13 NOTE — TOC Transition Note (Signed)
Transition of Care [facility]) - CM/SW Discharge Note   Patient Details  Name: TABIA LANDOWSKI MRN: [id number] Date of Birth: Oct 17, 1975  Transition of Care [facility]) CM/SW Contact:  Zenon Mayo, RN Phone Number: 08/13/2022, 1:51 PM   Clinical Narrative:    Patient may be for possible dc today, she has no needs.          Patient Goals and CMS Choice      Discharge Placement                         Discharge Plan and Services Additional resources added to the After Visit Summary for                                       Social Determinants of Health (SDOH) Interventions SDOH Screenings   Depression (PHQ2-9): Low Risk  (01/23/2022)  Tobacco Use: Medium Risk (08/12/2022)     Readmission Risk Interventions    01/03/2022    2:44 PM  Readmission Risk Prevention Plan  Transportation Screening Complete  PCP or Specialist Appt within 5-7 Days Complete

## 2022-08-13 NOTE — Progress Notes (Signed)
Mobility Specialist Progress Note    08/13/22 1258  Mobility  Activity Ambulated independently in hallway  Level of Assistance Independent  Assistive Device None  Distance Ambulated (ft) 420 ft  Activity Response Tolerated well  Mobility Referral Yes  $Mobility charge 1 Mobility   Pt received in bed and agreeable. No complaints on walk. Returned to room with call bell in reach.    Hildred Alamin Mobility Specialist  Please Psychologist, sport and exercise or Rehab Office at [phone]

## 2022-08-13 NOTE — Care Management Important Message (Signed)
Important Message  Patient Details  Name: Crystal Mora MRN: [id number] Date of Birth: 07/20/1976   Medicare Important Message Given:  Yes     Shelda Altes 08/13/2022, 11:53 AM

## 2022-08-14 ENCOUNTER — Encounter (HOSPITAL_COMMUNITY): Payer: Self-pay | Admitting: Thoracic Surgery (Cardiothoracic Vascular Surgery)

## 2022-08-14 ENCOUNTER — Inpatient Hospital Stay (HOSPITAL_COMMUNITY): Payer: Medicare Other

## 2022-08-14 MED ORDER — ONDANSETRON HCL 4 MG PO TABS: 4.0000 mg | ORAL_TABLET | Freq: Three times a day (TID) | ORAL | 0 refills | Status: AC | PRN

## 2022-08-14 NOTE — Plan of Care (Signed)
Problem: Education: Goal: Knowledge of General Education information will improve Description: Including pain rating scale, medication(s)/side effects and non-pharmacologic comfort measures 08/14/2022 1432 by Lucilla Lame, RN Outcome: Adequate for Discharge 08/14/2022 1432 by Lucilla Lame, RN Outcome: Adequate for Discharge   Problem: Health Behavior/Discharge Planning: Goal: Ability to manage health-related needs will improve 08/14/2022 1432 by Lucilla Lame, RN Outcome: Adequate for Discharge 08/14/2022 1432 by Lucilla Lame, RN Outcome: Adequate for Discharge   Problem: Clinical Measurements: Goal: Ability to maintain clinical measurements within normal limits will improve 08/14/2022 1432 by Lucilla Lame, RN Outcome: Adequate for Discharge 08/14/2022 1432 by Lucilla Lame, RN Outcome: Adequate for Discharge Goal: Will remain free from infection 08/14/2022 1432 by Lucilla Lame, RN Outcome: Adequate for Discharge 08/14/2022 1432 by Lucilla Lame, RN Outcome: Adequate for Discharge Goal: Diagnostic test results will improve 08/14/2022 1432 by Lucilla Lame, RN Outcome: Adequate for Discharge 08/14/2022 1432 by Lucilla Lame, RN Outcome: Adequate for Discharge Goal: Respiratory complications will improve 08/14/2022 1432 by Lucilla Lame, RN Outcome: Adequate for Discharge 08/14/2022 1432 by Lucilla Lame, RN Outcome: Adequate for Discharge Goal: Cardiovascular complication will be avoided 08/14/2022 1432 by Lucilla Lame, RN Outcome: Adequate for Discharge 08/14/2022 1432 by Lucilla Lame, RN Outcome: Adequate for Discharge   Problem: Activity: Goal: Risk for activity intolerance will decrease 08/14/2022 1432 by Lucilla Lame, RN Outcome: Adequate for Discharge 08/14/2022 1432 by Lucilla Lame, RN Outcome: Adequate for Discharge   Problem: Nutrition: Goal: Adequate nutrition will be maintained 08/14/2022 1432 by Lucilla Lame, RN Outcome: Adequate for  Discharge 08/14/2022 1432 by Lucilla Lame, RN Outcome: Adequate for Discharge   Problem: Coping: Goal: Level of anxiety will decrease 08/14/2022 1432 by Lucilla Lame, RN Outcome: Adequate for Discharge 08/14/2022 1432 by Lucilla Lame, RN Outcome: Adequate for Discharge   Problem: Elimination: Goal: Will not experience complications related to bowel motility 08/14/2022 1432 by Lucilla Lame, RN Outcome: Adequate for Discharge 08/14/2022 1432 by Lucilla Lame, RN Outcome: Adequate for Discharge Goal: Will not experience complications related to urinary retention 08/14/2022 1432 by Lucilla Lame, RN Outcome: Adequate for Discharge 08/14/2022 1432 by Lucilla Lame, RN Outcome: Adequate for Discharge   Problem: Pain Managment: Goal: General experience of comfort will improve 08/14/2022 1432 by Lucilla Lame, RN Outcome: Adequate for Discharge 08/14/2022 1432 by Lucilla Lame, RN Outcome: Adequate for Discharge   Problem: Safety: Goal: Ability to remain free from injury will improve 08/14/2022 1432 by Lucilla Lame, RN Outcome: Adequate for Discharge 08/14/2022 1432 by Lucilla Lame, RN Outcome: Adequate for Discharge   Problem: Skin Integrity: Goal: Risk for impaired skin integrity will decrease 08/14/2022 1432 by Lucilla Lame, RN Outcome: Adequate for Discharge 08/14/2022 1432 by Lucilla Lame, RN Outcome: Adequate for Discharge   Problem: Education: Goal: Knowledge of disease or condition will improve 08/14/2022 1432 by Lucilla Lame, RN Outcome: Adequate for Discharge 08/14/2022 1432 by Lucilla Lame, RN Outcome: Adequate for Discharge Goal: Knowledge of the prescribed therapeutic regimen will improve 08/14/2022 1432 by Lucilla Lame, RN Outcome: Adequate for Discharge 08/14/2022 1432 by Lucilla Lame, RN Outcome: Adequate for Discharge   Problem: Activity: Goal: Risk for activity intolerance will decrease 08/14/2022 1432 by Lucilla Lame, RN Outcome:  Adequate for Discharge 08/14/2022 1432 by Lucilla Lame, RN Outcome: Adequate for Discharge   Problem: Cardiac: Goal:  Will achieve and/or maintain hemodynamic stability 08/14/2022 1432 by Lucilla Lame, RN Outcome: Adequate for Discharge 08/14/2022 1432 by Lucilla Lame, RN Outcome: Adequate for Discharge   Problem: Clinical Measurements: Goal: Postoperative complications will be avoided or minimized 08/14/2022 1432 by Lucilla Lame, RN Outcome: Adequate for Discharge 08/14/2022 1432 by Lucilla Lame, RN Outcome: Adequate for Discharge   Problem: Respiratory: Goal: Respiratory status will improve 08/14/2022 1432 by Lucilla Lame, RN Outcome: Adequate for Discharge 08/14/2022 1432 by Lucilla Lame, RN Outcome: Adequate for Discharge   Problem: Pain Management: Goal: Pain level will decrease 08/14/2022 1432 by Lucilla Lame, RN Outcome: Adequate for Discharge 08/14/2022 1432 by Lucilla Lame, RN Outcome: Adequate for Discharge   Problem: Skin Integrity: Goal: Wound healing without signs and symptoms infection will improve 08/14/2022 1432 by Lucilla Lame, RN Outcome: Adequate for Discharge 08/14/2022 1432 by Lucilla Lame, RN Outcome: Adequate for Discharge

## 2022-08-14 NOTE — Plan of Care (Signed)
  Problem: Education: Goal: Knowledge of General Education information will improve Description: Including pain rating scale, medication(s)/side effects and non-pharmacologic comfort measures Outcome: Adequate for Discharge   Problem: Health Behavior/Discharge Planning: Goal: Ability to manage health-related needs will improve Outcome: Adequate for Discharge   Problem: Clinical Measurements: Goal: Ability to maintain clinical measurements within normal limits will improve Outcome: Adequate for Discharge Goal: Will remain free from infection Outcome: Adequate for Discharge Goal: Diagnostic test results will improve Outcome: Adequate for Discharge Goal: Respiratory complications will improve Outcome: Adequate for Discharge Goal: Cardiovascular complication will be avoided Outcome: Adequate for Discharge   Problem: Activity: Goal: Risk for activity intolerance will decrease Outcome: Adequate for Discharge   Problem: Nutrition: Goal: Adequate nutrition will be maintained Outcome: Adequate for Discharge   Problem: Coping: Goal: Level of anxiety will decrease Outcome: Adequate for Discharge   Problem: Elimination: Goal: Will not experience complications related to bowel motility Outcome: Adequate for Discharge Goal: Will not experience complications related to urinary retention Outcome: Adequate for Discharge   Problem: Pain Managment: Goal: General experience of comfort will improve Outcome: Adequate for Discharge   Problem: Safety: Goal: Ability to remain free from injury will improve Outcome: Adequate for Discharge   Problem: Skin Integrity: Goal: Risk for impaired skin integrity will decrease Outcome: Adequate for Discharge   Problem: Education: Goal: Knowledge of disease or condition will improve Outcome: Adequate for Discharge Goal: Knowledge of the prescribed therapeutic regimen will improve Outcome: Adequate for Discharge   Problem: Activity: Goal: Risk for  activity intolerance will decrease Outcome: Adequate for Discharge   Problem: Cardiac: Goal: Will achieve and/or maintain hemodynamic stability Outcome: Adequate for Discharge   Problem: Clinical Measurements: Goal: Postoperative complications will be avoided or minimized Outcome: Adequate for Discharge   Problem: Respiratory: Goal: Respiratory status will improve Outcome: Adequate for Discharge   Problem: Pain Management: Goal: Pain level will decrease Outcome: Adequate for Discharge   Problem: Skin Integrity: Goal: Wound healing without signs and symptoms infection will improve Outcome: Adequate for Discharge

## 2022-08-14 NOTE — Progress Notes (Signed)
Mobility Specialist Progress Note    08/14/22 0932  Mobility  Activity Ambulated independently in hallway  Level of Assistance Independent  Assistive Device None  Distance Ambulated (ft) 800 ft  Activity Response Tolerated well  $Mobility charge 1 Mobility   Pt received in bed and agreeable. No complaints on walk. Returned to bed with call bell in reach.    Hildred Alamin Mobility Specialist  Please Psychologist, sport and exercise or Rehab Office at [phone]

## 2022-08-14 NOTE — TOC Transition Note (Signed)
Transition of Care [facility]) - CM/SW Discharge Note   Patient Details  Name: Crystal Mora MRN: [id number] Date of Birth: May 21, 1976  [facility] CM/SW Contact:  Bartholomew Crews, RN Phone Number: [phone] 08/14/2022, 12:22 PM   Clinical Narrative:     Spoke with patient at the bedside to discuss post acute transition. Patient is hopeful to transition home today, but understands discharge is dependent on CXR results. Patient has been up out of bed. Verbalized being able to get medications from pharmacy as needed. She has transportation for f/u medical appointments. She stated that her husband will provide transportation home at discharge. No TOC needs identified at this time.   Final next level of care: Home/Self Care Barriers to Discharge: No Barriers Identified   Patient Goals and CMS Choice CMS Medicare.gov Compare Post Acute Care list provided to:: Patient Choice offered to / list presented to : NA  Discharge Placement                         Discharge Plan and Services Additional resources added to the After Visit Summary for                  DME Arranged: N/A DME Agency: NA       HH Arranged: NA [facility] Agency: NA        Social Determinants of Health (SDOH) Interventions SDOH Screenings   Depression (PHQ2-9): Low Risk  (01/23/2022)  Tobacco Use: Medium Risk (08/14/2022)     Readmission Risk Interventions    01/03/2022    2:44 PM  Readmission Risk Prevention Plan  Transportation Screening Complete  PCP or Specialist Appt within 5-7 Days Complete

## 2022-08-14 NOTE — Progress Notes (Signed)
      [address]       [place] [address]             [phone]      4 Days Post-Op Procedure(s) (LRB): XI ROBOTIC ASSISTED THORACOSCOPY- RIGHT LOWER LOBE WEDGE RESECTION, AND RIGHT LOWER LOBECTOMY (Right) LYMPH NODE DISSECTION (Right) INTERCOSTAL NERVE BLOCK (Right)  Subjective:  Patient doing well.  Having some pain, for the most part doing okay.  States she slept most of the day yesterday.  Notices she has some fluid weight is her legs  Objective: Vital signs in last 24 hours: Temp:  [97.8 F (36.6 C)-99 F (37.2 C)] 98.6 F (37 C) (01/02 0415) Pulse Rate:  [98-110] 109 (01/02 0415) Cardiac Rhythm: Sinus tachycardia (01/01 2011) Resp:  [14-20] 20 (01/02 0415) BP: (109-144)/(68-90) 109/68 (01/02 0415) SpO2:  [92 %-100 %] 92 % (01/02 0415)  Intake/Output from previous day: 01/01 0701 - 01/02 0700 In: 960 [P.O.:960] Out: 350 [Chest Tube:350]  General appearance: alert, cooperative, and no distress Heart: regular rate and rhythm and tachy Lungs: clear to auscultation bilaterally Abdomen: soft, non-tender; bowel sounds normal; no masses,  no organomegaly Extremities: extremities normal, atraumatic, no cyanosis or edema Wound: clean and dry  Lab Results: Recent Labs    08/12/22 0013  WBC 8.3  HGB 12.7  HCT 40.6  PLT 217   BMET:  Recent Labs    08/12/22 0013  NA 139  K 3.6  CL 106  CO2 27  GLUCOSE 140*  BUN 12  CREATININE 0.81  CALCIUM 8.4*    PT/INR: No results for input(s): "LABPROT", "INR" in the last 72 hours. ABG No results found for: "PHART", "HCO3", "TCO2", "ACIDBASEDEF", "O2SAT" CBG (last 3)  No results for input(s): "GLUCAP" in the last 72 hours.  Assessment/Plan: S/P Procedure(s) (LRB): XI ROBOTIC ASSISTED THORACOSCOPY- RIGHT LOWER LOBE WEDGE RESECTION, AND RIGHT LOWER LOBECTOMY (Right) LYMPH NODE DISSECTION (Right) INTERCOSTAL NERVE BLOCK (Right)  CV- NSR, H/O HTN- continue Cozaar, Norvasc Pulm- CT on water seal,  no air leak, stable appearance of pneumothorax, possibly d/c chest tube today HLD- continue Crestor Insomnia- good response to Ambien Dispo- patient stable, CT on water seal w/o air leak, possibly d/c chest tube today, will discuss with Dr. Kipp Brood   LOS: 4 days    Ellwood Handler, PA-C 08/14/2022

## 2022-08-16 LAB — CYTOLOGY - NON PAP

## 2022-08-17 ENCOUNTER — Ambulatory Visit (INDEPENDENT_AMBULATORY_CARE_PROVIDER_SITE_OTHER): Payer: Self-pay | Admitting: Thoracic Surgery (Cardiothoracic Vascular Surgery)

## 2022-08-17 VITALS — BP 144/87 | HR 94 | Resp 20 | Ht 64.0 in | Wt 189.0 lb

## 2022-08-17 DIAGNOSIS — Z9889 Other specified postprocedural states: Secondary | ICD-10-CM

## 2022-08-17 NOTE — Progress Notes (Signed)
      [address]       [place] [address]             [phone]        Anaalicia R Aul Trooper Medical Record #[id number] Date of Birth: 24-May-1976  Referring: Garner Nash, DO Primary Care: Kathreen Devoid, [place] Primary Cardiologist:None  Reason for visit:   follow-up  History of Present Illness:     47 year old female presents for first follow-up appointment.  Overall she is doing well.  Her pain is well-controlled.  She does have some exertional shortness of breath.  Physical Exam: BP (!) 144/87 (BP Location: Right Arm, Patient Position: Sitting)   Pulse 94   Resp 20   Ht 5\' 4"  (1.626 m)   Wt 189 lb (85.7 kg)   LMP 02/13/2020 (Approximate)   SpO2 95% Comment: RA  BMI 32.44 kg/m   Alert NAD Incision clean.   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  Pathology is pending.  I have made a referral to medical oncology.  Once the pathology is resulted I will call her to discuss her staging.   Lajuana Matte 08/17/2022 3:14 PM

## 2022-08-18 ENCOUNTER — Encounter: Payer: Self-pay | Admitting: Pulmonary Disease

## 2022-08-20 NOTE — Telephone Encounter (Signed)
Dr. Valeta Harms, please review mychart messages sent by pt and advise.

## 2022-08-22 ENCOUNTER — Other Ambulatory Visit: Payer: Self-pay

## 2022-08-22 DIAGNOSIS — Z902 Acquired absence of lung [part of]: Secondary | ICD-10-CM

## 2022-08-22 LAB — SURGICAL PATHOLOGY

## 2022-08-22 NOTE — Progress Notes (Signed)
CBC and CMP ordered for new patient appointment on 09/03/2022

## 2022-08-24 ENCOUNTER — Ambulatory Visit: Payer: Medicare Other | Admitting: Thoracic Surgery (Cardiothoracic Vascular Surgery)

## 2022-08-30 ENCOUNTER — Other Ambulatory Visit: Payer: Self-pay

## 2022-08-30 NOTE — Progress Notes (Signed)
The proposed treatment discussed in conference is for discussion purpose only and is not a binding recommendation.  The patients have not been physically examined, or presented with their treatment options.  Therefore, final treatment plans cannot be decided.  

## 2022-09-03 ENCOUNTER — Encounter: Payer: Self-pay | Admitting: Pulmonary Disease

## 2022-09-03 ENCOUNTER — Inpatient Hospital Stay: Payer: 59

## 2022-09-03 ENCOUNTER — Inpatient Hospital Stay: Payer: 59 | Attending: Internal Medicine | Admitting: Internal Medicine

## 2022-09-03 VITALS — BP 115/73 | HR 98 | Temp 98.5°F | Resp 17 | Wt 184.5 lb

## 2022-09-03 DIAGNOSIS — C3431 Malignant neoplasm of lower lobe, right bronchus or lung: Secondary | ICD-10-CM

## 2022-09-03 DIAGNOSIS — C782 Secondary malignant neoplasm of pleura: Secondary | ICD-10-CM | POA: Diagnosis not present

## 2022-09-03 DIAGNOSIS — C3490 Malignant neoplasm of unspecified part of unspecified bronchus or lung: Secondary | ICD-10-CM

## 2022-09-03 DIAGNOSIS — Z902 Acquired absence of lung [part of]: Secondary | ICD-10-CM

## 2022-09-03 DIAGNOSIS — C3491 Malignant neoplasm of unspecified part of right bronchus or lung: Secondary | ICD-10-CM

## 2022-09-03 DIAGNOSIS — Z87891 Personal history of nicotine dependence: Secondary | ICD-10-CM

## 2022-09-03 DIAGNOSIS — I10 Essential (primary) hypertension: Secondary | ICD-10-CM

## 2022-09-03 DIAGNOSIS — E079 Disorder of thyroid, unspecified: Secondary | ICD-10-CM | POA: Diagnosis not present

## 2022-09-03 DIAGNOSIS — F17210 Nicotine dependence, cigarettes, uncomplicated: Secondary | ICD-10-CM | POA: Diagnosis not present

## 2022-09-03 DIAGNOSIS — E119 Type 2 diabetes mellitus without complications: Secondary | ICD-10-CM | POA: Diagnosis not present

## 2022-09-03 LAB — CMP (CANCER CENTER ONLY)
ALT: 17 U/L (ref 0–44)
AST: 18 U/L (ref 15–41)
Albumin: 3.7 g/dL (ref 3.5–5.0)
Alkaline Phosphatase: 116 U/L (ref 38–126)
Anion gap: 6 (ref 5–15)
BUN: 12 mg/dL (ref 6–20)
CO2: 27 mmol/L (ref 22–32)
Calcium: 9.2 mg/dL (ref 8.9–10.3)
Chloride: 107 mmol/L (ref 98–111)
Creatinine: 0.93 mg/dL (ref 0.44–1.00)
GFR, Estimated: >60 mL/min (ref >60)
Glucose, Bld: 78 mg/dL (ref 70–99)
Potassium: 4.1 mmol/L (ref 3.5–5.1)
Sodium: 140 mmol/L (ref 135–145)
Total Bilirubin: 0.2 mg/dL — ABNORMAL LOW (ref 0.3–1.2)
Total Protein: 7.2 g/dL (ref 6.5–8.1)

## 2022-09-03 NOTE — Progress Notes (Signed)
[facility] [phone]   [phone]) [phone]  CONSULT NOTE  REFERRING PHYSICIAN: Dr. Brynda Greathouse.  REASON FOR CONSULTATION:  47 years old African-American female recently diagnosed with lung cancer  HPI Crystal Mora is a 47 y.o. female with past medical history significant for anxiety, bipolar disorder, depression, hypertension, thyroid disease as well as long history of smoking.  The patient mentions that in May 2023 she was admitted to the hospital with abdominal pain and thought to have procedure perforated gastric ulcer secondary to chronic NSAIDs as well as steroid use.  During her evaluation she had CT angiogram of the chest, abdomen and pelvis on Jan 02, 2022 and that showed 1.4 x 0.7 cm pleural-based density posteriorly in the right lower lobe with cavitation concerning for possible cavitary pneumonia or necrotic neoplasm and follow-up imaging studies was recommended.  She also had 6.6 x 4.9 cm subcapsular fluid collection along the inferior aspect of the left hepatic lobe concerning for abscess and she had drainage at that time.  Follow-up CT scan of the chest on May 28, 2022 showed similar to slightly increased size of the 1.4 by 0.9 cm subpleural cavitary nodule in the posterior lateral right lower lobe and tissue sampling was recommended.  A PET scan was performed on June 29, 2022 and that showed low metabolic activity and the persistent subsolid nodule in the right lower lobe.  The nodule remains indeterminate with differential including low-grade adenocarcinoma versus persistent focus of pulmonary infection but no metastatic lymphadenopathy and no distant metastasis. The patient was referred to Dr. Tonia Brooms and on August 10, 2022 she underwent video bronchoscopy with robotic assisted bronchoscopic navigation in addition to robotic assisted right video thoracoscopy, right lower lobe ectomy and mediastinal lymph node sampling under the care of  Dr. Cliffton Asters.  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 The patient was referred to me today for evaluation and recommendation regarding treatment of her condition. When seen today she is feeling fine except for the shortness of breath increased with exertion.  She also has blurry vision and mild cough.  She denied having any chest pain or hemoptysis.  She has no nausea, vomiting, diarrhea or constipation.  She has no headache or weight loss. Family history significant for mother with depression and mental disorder.  Brother had mental disorder. The patient is married and has 10 children and 3 grandchildren.  She was accompanied today by her husband Crystal Mora.  She worked in several jobs Therapist, nutritional as well as Futures trader. The patient has a history of smoking 1 pack/day for around 30 years.  She quit 2 weeks before surgery but started smoking again after the surgery.  She drinks alcohol occasionally and no history of drug abuse.  HPI  Past Medical History:  Diagnosis Date   Anxiety    Bipolar 1 disorder (HCC)    Chronic urticaria    Depression    Diabetes mellitus without complication (HCC)    Pt states prediabetic   Hypertension    states she has not took BP medication for 2 years   Insomnia    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   Thyroid disease     Past Surgical History:  Procedure Laterality Date   BRONCHIAL BRUSHINGS  08/10/2022   Procedure: BRONCHIAL BRUSHINGS;  Surgeon: Tonia Brooms,  Rachel Bo, DO;  Location: [facility];  Service: Pulmonary;;   BRONCHIAL NEEDLE ASPIRATION BIOPSY  08/10/2022   Procedure: BRONCHIAL NEEDLE ASPIRATION BIOPSIES;  Surgeon: Josephine Igo, DO;  Location: [facility];  Service: Pulmonary;;   CESAREAN SECTION     times 5   FIDUCIAL  MARKER PLACEMENT  08/10/2022   Procedure: FIDUCIAL dye marker;  Surgeon: Josephine Igo, DO;  Location: [facility];  Service: Pulmonary;;   INTERCOSTAL NERVE BLOCK Right 08/10/2022   Procedure: INTERCOSTAL NERVE BLOCK;  Surgeon: Corliss Skains, MD;  Location: [facility] OR;  Service: Thoracic;  Laterality: Right;   IR RADIOLOGIST EVAL & MGMT  01/18/2022   LYMPH NODE DISSECTION Right 08/10/2022   Procedure: LYMPH NODE DISSECTION;  Surgeon: Corliss Skains, MD;  Location: [facility] OR;  Service: Thoracic;  Laterality: Right;   THYROID SURGERY     TUBAL LIGATION     tubal reversal Bilateral     Family History  Problem Relation Age of Onset   Depression Mother    Mental illness Mother    Asthma Mother    COPD Mother    Diabetes Mother    Mental illness Brother    Mental illness Daughter    Diabetes Maternal Aunt    Mental illness Maternal Aunt    Depression Maternal Aunt    Diabetes Maternal Grandmother     Social History Social History   Tobacco Use   Smoking status: Former    Packs/day: 0.50    Types: Cigars, Cigarettes    Quit date: 07/22/2022    Years since quitting: 0.1   Smokeless tobacco: Never   Tobacco comments:    Smokes 1-2 black and Mild every other day and vape every day. 06/14/2022 Tay  Vaping Use   Vaping Use: Former   Quit date: 07/22/2022   Substances: Flavoring  Substance Use Topics   Alcohol use: Not Currently    Comment: Ocassional   Drug use: No    Allergies  Allergen Reactions   Biaxin [Clarithromycin] Hives    Welts    Morphine And Related Hives    Says it is okay with benadryl administration   Percocet [Oxycodone-Acetaminophen] Hives and Itching   Shrimp [Shellfish Allergy]    Celexa [Citalopram] Anxiety    aggression   Topamax [Topiramate] Rash    Current Outpatient Medications  Medication Sig Dispense Refill   acetaminophen (TYLENOL) 325 MG tablet Take 2 tablets (650 mg total) by mouth every 6 (six) hours as needed for mild pain.      amLODipine (NORVASC) 2.5 MG tablet Take 2.5 mg by mouth in the morning.     aspirin EC 81 MG tablet Take 81 mg by mouth in the morning.     Bacillus Coagulans-Inulin (BENEFIBER PREBIOTIC+PROBIOTIC) CHEW Chew 3 tablets by mouth in the morning and at bedtime.     diazepam (VALIUM) 5 MG tablet Take 5 mg by mouth in the morning and at bedtime.     diphenhydrAMINE (BENADRYL) 25 mg capsule Take 25 mg by mouth 2 (two) times daily as needed for itching.     EPINEPHrine (EPIPEN 2-PAK) 0.3 mg/0.3 mL IJ SOAJ injection Inject 0.3 mLs (0.3 mg total) into the muscle once as needed for up to 1 dose (for severe allergic reaction). CAll 911 immediately if you have to use this medicine 1 Device 1   estradiol (CLIMARA - DOSED IN MG/24 HR) 0.05 mg/24hr patch Place 0.05 mg onto the skin every Sunday.  HYDROcodone-acetaminophen (NORCO) 7.5-325 MG tablet Take 1 tablet by mouth 3 (three) times daily as needed (pain.).     hydrOXYzine (ATARAX) 50 MG tablet Take 50 mg by mouth every 8 (eight) hours as needed for anxiety.     ketotifen (ALLERGY EYE DROPS) 0.035 % ophthalmic solution Place 1 drop into both eyes in the morning and at bedtime.     losartan (COZAAR) 100 MG tablet Take 100 mg by mouth in the morning.     Multiple Vitamins-Minerals (ADULT GUMMY PO) Take 3 each by mouth in the morning.     ondansetron (ZOFRAN) 4 MG tablet Take 1 tablet (4 mg total) by mouth every 8 (eight) hours as needed for nausea or vomiting. 20 tablet 0   pantoprazole (PROTONIX) 40 MG tablet Take 1 tablet (40 mg total) by mouth 2 (two) times daily. 180 tablet 1   RESTASIS 0.05 % ophthalmic emulsion Place 1 drop into both eyes 2 (two) times daily.     rosuvastatin (CRESTOR) 5 MG tablet Take 5 mg by mouth in the morning.     sucralfate (CARAFATE) 1 GM/10ML suspension Take 10 mLs (1 g total) by mouth 4 (four) times daily. 420 mL 1   XOLAIR 150 MG/ML prefilled syringe Inject 150 mg into the skin every 14 (fourteen) days.     No current  facility-administered medications for this visit.    Review of Systems  Constitutional: positive for fatigue Eyes: positive for visual disturbance Ears, nose, mouth, throat, and face: negative Respiratory: positive for cough Cardiovascular: negative Gastrointestinal: negative Genitourinary:negative Integument/breast: negative Hematologic/lymphatic: negative Musculoskeletal:negative Neurological: negative Behavioral/Psych: negative Endocrine: negative Allergic/Immunologic: negative  Physical Exam  ZDG:UYQIH, healthy, no distress, well nourished, and well developed SKIN: skin color, texture, turgor are normal, no rashes or significant lesions HEAD: Normocephalic, No masses, lesions, tenderness or abnormalities EYES: normal, PERRLA, Conjunctiva are pink and non-injected EARS: External ears normal, Canals clear OROPHARYNX:no exudate, no erythema, and lips, buccal mucosa, and tongue normal  NECK: supple, no adenopathy, no JVD LYMPH:  no palpable lymphadenopathy, no hepatosplenomegaly BREAST:not examined LUNGS: clear to auscultation , and palpation HEART: regular rate & rhythm, no murmurs, and no gallops ABDOMEN:abdomen soft, non-tender, normal bowel sounds, and no masses or organomegaly BACK: Back symmetric, no curvature., No CVA tenderness EXTREMITIES:no joint deformities, effusion, or inflammation, no edema  NEURO: alert & oriented x 3 with fluent speech, no focal motor/sensory deficits  PERFORMANCE STATUS: ECOG 1  LABORATORY DATA: Lab Results  Component Value Date   WBC 8.3 08/12/2022   HGB 12.7 08/12/2022   HCT 40.6 08/12/2022   MCV 90.2 08/12/2022   PLT 217 08/12/2022      Chemistry      Component Value Date/Time   NA 139 08/12/2022 0013   K 3.6 08/12/2022 0013   CL 106 08/12/2022 0013   CO2 27 08/12/2022 0013   BUN 12 08/12/2022 0013   CREATININE 0.81 08/12/2022 0013      Component Value Date/Time   CALCIUM 8.4 (L) 08/12/2022 0013   ALKPHOS 70 08/12/2022  0013   AST 24 08/12/2022 0013   ALT 22 08/12/2022 0013   BILITOT 0.1 (L) 08/12/2022 0013       RADIOGRAPHIC STUDIES: DG Chest 1V REPEAT Same Day  Result Date: 08/14/2022 CLINICAL DATA:  47 year old female with no pneumothorax and RIGHT-sided chest tube. EXAM: CHEST - 1 VIEW SAME DAY COMPARISON:  August 14, 2022 at 5:21 a.m. FINDINGS: EKG leads project over the chest. RIGHT-sided chest tube in place,  tip near the RIGHT lung apex. Mild tracheal deviation to the RIGHT. The lung volumes are slightly diminished compared to previous imaging. Accounting for diminished lung volumes the apical pneumothorax which is of moderate size approximately 2.7 cm from the apex is unchanged. Question of small associated RIGHT-sided pleural effusion. Increasing RIGHT basilar opacities since previous imaging. Mild LEFT basilar atelectasis. On limited assessment there is no acute skeletal process. IMPRESSION: 1. Stable moderate RIGHT apical pneumothorax with chest tube in place. 2. Increasing RIGHT basilar opacities since previous imaging, may reflect volume loss with slightly diminished lung volumes. Attention on follow-up. 3. Question small RIGHT-sided pleural effusion. 4. Tracheal buckling likely related to low lung volumes, deviated towards the site of the pneumothorax. Electronically Signed   By: Zetta Bills M.D.   On: 08/14/2022 13:18   DG CHEST PORT 1 VIEW  Result Date: 08/14/2022 CLINICAL DATA:  Pneumothorax follow-up. EXAM: PORTABLE CHEST 1 VIEW COMPARISON:  Chest x-ray from yesterday. FINDINGS: Unchanged right-sided chest tube. The right apical pneumothorax has slightly increased in size since yesterday. Similar degree of volume loss in the right hemithorax with unchanged bibasilar atelectasis. No pleural effusion. The heart size and mediastinal contours are within normal limits. No acute osseous abnormality. IMPRESSION: 1. Slightly increased right apical pneumothorax. Electronically Signed   By: Titus Dubin  M.D.   On: 08/14/2022 08:30   DG Chest Port 1 View  Result Date: 08/13/2022 CLINICAL DATA:  Follow-up pneumothorax EXAM: PORTABLE CHEST 1 VIEW COMPARISON:  August 13, 2022 FINDINGS: The right chest tube is stable. The right apical pneumothorax is a little larger in the interval. Bibasilar opacities, left greater than right, are stable. No other changes. IMPRESSION: 1. The right apical pneumothorax is a little larger in the interval. The right chest tube remains in place. 2. Bibasilar opacities, left greater than right, are stable. Electronically Signed   By: Dorise Bullion III M.D.   On: 08/13/2022 12:41   DG CHEST PORT 1 VIEW  Result Date: 08/13/2022 CLINICAL DATA:  Status post lobectomy. EXAM: PORTABLE CHEST 1 VIEW COMPARISON:  August 12, 2022 FINDINGS: A right apical pneumothorax is stable. The right chest tube is stable. No left-sided pneumothorax. Mild bibasilar opacities are more conspicuous in the interval. Elevation the right hemidiaphragm remains. The cardiomediastinal silhouette is stable. No other acute abnormalities. IMPRESSION: 1. The right chest tube is in stable position with a persistent stable right apical pneumothorax. 2. Bibasilar opacities are more conspicuous in the interval a could represent developing infiltrate or atelectasis. Recommend clinical correlation and attention on follow-up. 3. No other abnormalities. Electronically Signed   By: Dorise Bullion III M.D.   On: 08/13/2022 11:16   DG CHEST PORT 1 VIEW  Result Date: 08/12/2022 CLINICAL DATA:  Follow up pneumothorax, lobectomy EXAM: PORTABLE CHEST 1 VIEW COMPARISON:  08/11/2022. FINDINGS: Pulmonary vascular congestion noted. Right hemidiaphragm elevated. Right-sided apical pneumothorax with up to 1.5 cm pleural separation, slightly smaller than on the prior study. Right apical chest tube in place. IMPRESSION: Right apical pneumothorax slightly smaller than on prior study. Vascular congestion without focal consolidation.  Electronically Signed   By: Sammie Bench M.D.   On: 08/12/2022 09:54   DG Chest Port 1 View  Result Date: 08/11/2022 CLINICAL DATA:  47 year old female postoperative day 1 status post right lower lobectomy for adenocarcinoma. EXAM: PORTABLE CHEST 1 VIEW COMPARISON:  Portable chest 08/10/2022 and earlier. FINDINGS: Portable AP semi upright view at 0534 hours. Stable right chest tube which courses to the right  lung apex. Post lobectomy pneumothorax on that side. Mild right lower chest wall gas appears stable. Stable cardiac size and mediastinal contours. Improved lung volumes and bilateral ventilation. No pulmonary edema or pleural effusion identified. Stable visualized osseous structures. Negative visible bowel gas. IMPRESSION: Right side pneumothorax but overall expected post lobectomy appearance of the right lung with stable right chest tube, larger lung volumes. Electronically Signed   By: Odessa Fleming M.D.   On: 08/11/2022 08:59   DG Chest Port 1 View  Result Date: 08/10/2022 CLINICAL DATA:  Status post right lower lobectomy. Chest tube in place. EXAM: PORTABLE CHEST 1 VIEW COMPARISON:  Chest radiograph 2 days prior. FINDINGS: There is a right chest tube in place with tip in the right apex. The cardiomediastinal silhouette is stable, allowing for low lung volumes and AP technique. There has been interval right lower lobectomy with volume loss in the right hemithorax. There is no appreciable pneumothorax. There is no focal airspace disease. There is no pulmonary edema. There is no pleural effusion. There is no left pneumothorax There is no acute osseous abnormality. IMPRESSION: Status post right lower lobectomy with right apical chest tube in place. No appreciable pneumothorax. Electronically Signed   By: Lesia Hausen M.D.   On: 08/10/2022 14:04   DG C-ARM BRONCHOSCOPY  Result Date: 08/10/2022 C-ARM BRONCHOSCOPY: Fluoroscopy was utilized by the requesting physician.  No radiographic interpretation.    DG Chest 2 View  Result Date: 08/08/2022 CLINICAL DATA:  [id number] Pre-op chest exam [id number] EXAM: CHEST - 2 VIEW COMPARISON:  01/02/2022 FINDINGS: Cardiac silhouette is unremarkable. No pneumothorax or pleural effusion. The lungs are clear. The visualized skeletal structures are unremarkable. IMPRESSION: No acute cardiopulmonary process. Electronically Signed   By: Layla Maw M.D.   On: 08/08/2022 20:42    ASSESSMENT: This is a very pleasant 47 years old African-American female recently diagnosed with a stage Ib (T2a, N0, M0) non-small cell lung cancer, adenocarcinoma presented with cavitary right lower lobe lung nodule status post right lower lobectomy with lymph node sampling under the care of Dr. Cliffton Asters on August 10, 2022 and there was visceral pleural involvement and the tumor was 1.1 cm in size.   PLAN: I had a lengthy discussion with the patient and her husband today about her current condition and treatment options.  I personally and independently reviewed the imaging studies as well as the pathology report and discussed the result with the patient and her husband. I explained to the patient that there is no survival benefit for adjuvant systemic chemotherapy for patient with a stage Ib if the tumor size is less than 4.0 cm and the current standard of care is observation and close monitoring. I recommended for the patient to continue on observation with repeat CT scan of the chest in 6 months for the first 2 years followed by annual scan after that. I strongly encouraged the patient to quit smoking. I will see her back for follow-up visit with the upcoming visit in 6 months. She was advised to call immediately if she has any other concerning symptoms in the interval.  The patient voices understanding of current disease status and treatment options and is in agreement with the current care plan.  All questions were answered. The patient knows to call the clinic with any problems,  questions or concerns. We can certainly see the patient much sooner if necessary.  Thank you so much for allowing me to participate in the care of Crystal Mora. I  will continue to follow up the patient with you and assist in her care.  The total time spent in the appointment was 60 minutes.  Disclaimer: This note was dictated with voice recognition software. Similar sounding words can inadvertently be transcribed and may not be corrected upon review.   Lajuana Matte September 03, 2022, 2:55 PM

## 2022-09-04 NOTE — Addendum Note (Signed)
Addended by: Charma Igo on: 09/04/2022 10:24 AM   Modules accepted: Orders

## 2022-09-09 ENCOUNTER — Other Ambulatory Visit: Payer: Self-pay | Admitting: Gastroenterology

## 2022-09-09 DIAGNOSIS — Z8711 Personal history of peptic ulcer disease: Secondary | ICD-10-CM

## 2022-09-09 DIAGNOSIS — R1010 Upper abdominal pain, unspecified: Secondary | ICD-10-CM

## 2022-09-09 DIAGNOSIS — D509 Iron deficiency anemia, unspecified: Secondary | ICD-10-CM

## 2022-09-27 ENCOUNTER — Other Ambulatory Visit: Payer: Self-pay | Admitting: Thoracic Surgery (Cardiothoracic Vascular Surgery)

## 2022-09-27 DIAGNOSIS — C3490 Malignant neoplasm of unspecified part of unspecified bronchus or lung: Secondary | ICD-10-CM

## 2022-09-28 ENCOUNTER — Ambulatory Visit (INDEPENDENT_AMBULATORY_CARE_PROVIDER_SITE_OTHER): Payer: 59 | Admitting: Thoracic Surgery (Cardiothoracic Vascular Surgery)

## 2022-09-28 VITALS — BP 154/86 | HR 94 | Resp 18 | Ht 64.0 in | Wt 180.0 lb

## 2022-09-28 DIAGNOSIS — Z902 Acquired absence of lung [part of]: Secondary | ICD-10-CM

## 2022-09-28 NOTE — Progress Notes (Signed)
      [address]       [place] [address]             [id number]        Dania R Gamblin [facility] Medical Record #[id number] Date of Birth: [birth year]  Referring: Garner Nash, DO Primary Care: Kathreen Devoid, [place] Primary Cardiologist:None  Reason for visit:   follow-up  History of Present Illness:     47 year old female presents for 1 month follow-up appointment.  Overall she is doing okay.  She has gone back to smoking with complaint of some shortness of breath.  She also complains of some tingling around her incision.  Physical Exam: BP (!) 154/86 (BP Location: Left Arm, Patient Position: Sitting)   Pulse 94   Resp 18   Ht 5\' 4"  (1.626 m)   Wt 180 lb (81.6 kg)   LMP 02/13/2020 (Approximate)   SpO2 96% Comment: RA  BMI 30.90 kg/m   Alert NAD Incision clean.  Abdomen, ND No peripheral edema   Diagnostic Studies & Laboratory data: CXR: Clear     Assessment / Plan:   47 year old female status post right lower lobectomy for a T2a N0 M0 adenocarcinoma.  She is already met with medical oncology.  Smoking cessation was advised.  She will follow-up as needed.   Lajuana Matte 09/28/2022 3:57 PM

## 2022-09-30 ENCOUNTER — Encounter: Payer: Self-pay | Admitting: Gastroenterology

## 2022-12-23 IMAGING — CT CT ABD-PELV W/ CM
2 of 4 series · 12 of 46 positions shown, 14 images · IV contrast (agent unspecified)
Comparison: 01/02/2022

CLINICAL DATA: 45-year-old female with a history of multiple
abscesses

EXAM:
CT ABDOMEN AND PELVIS WITH CONTRAST
TECHNIQUE: Multidetector CT imaging of the abdomen and pelvis was performed
using the standard protocol following bolus administration of
intravenous contrast.

[Series 2: abd pelvis 5.00 br40 s3 axial · axial · 0.57mm/px · z∈[+1050,+1450]mm · 9 of 98 slices shown, 11 images]
[im 9/98  soft-tissue]
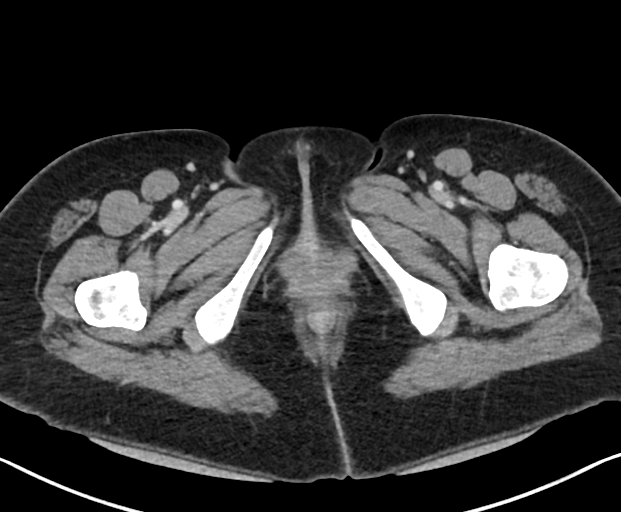
[im 9/98  bone]
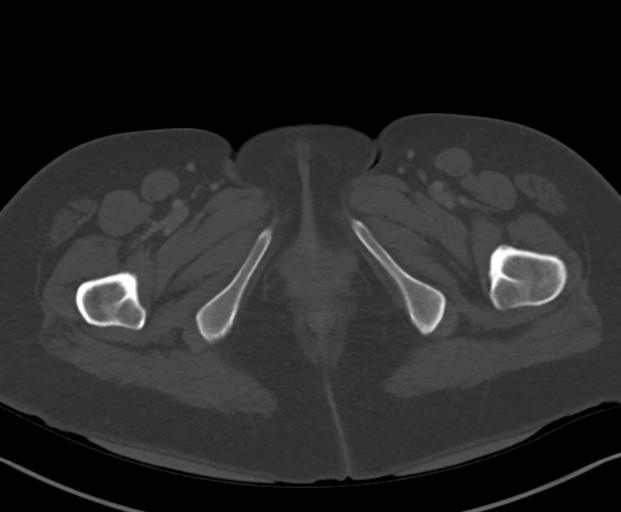
[im 18/98  soft-tissue]
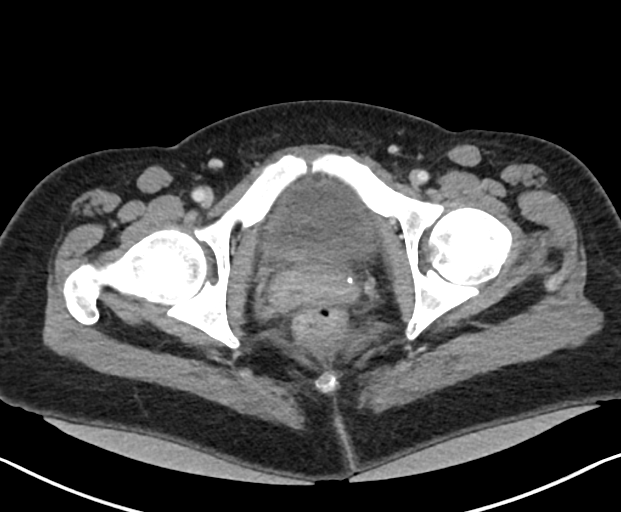
[im 27/98  soft-tissue]
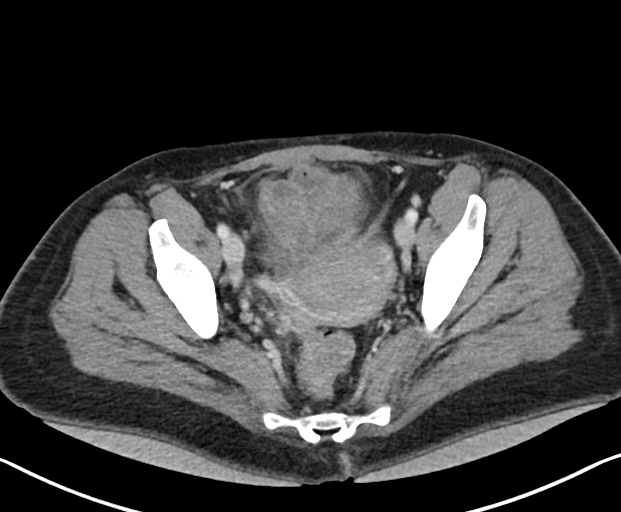
[im 40/98  soft-tissue]
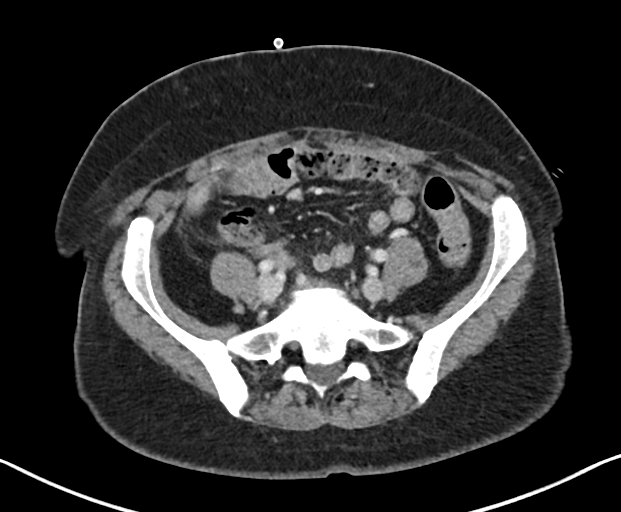
[im 49/98  soft-tissue]
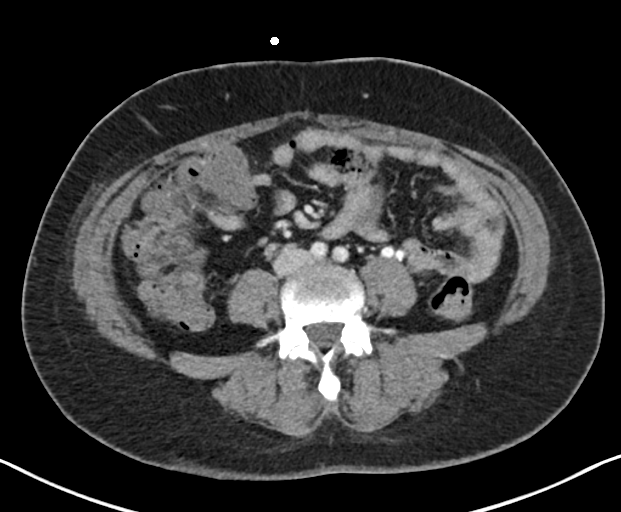
[im 58/98  soft-tissue]
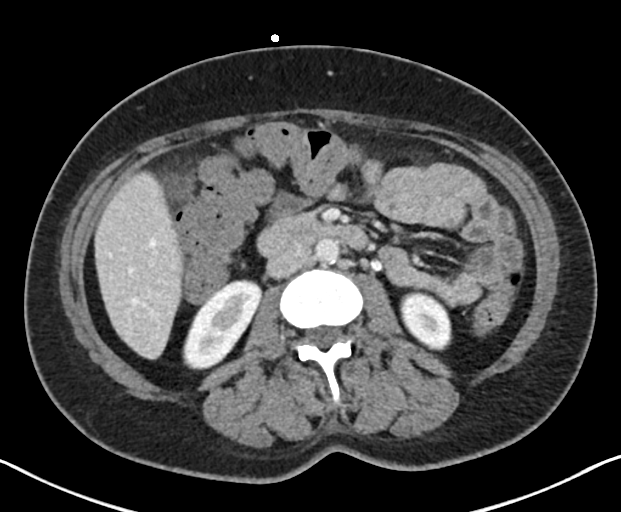
[im 71/98  soft-tissue]
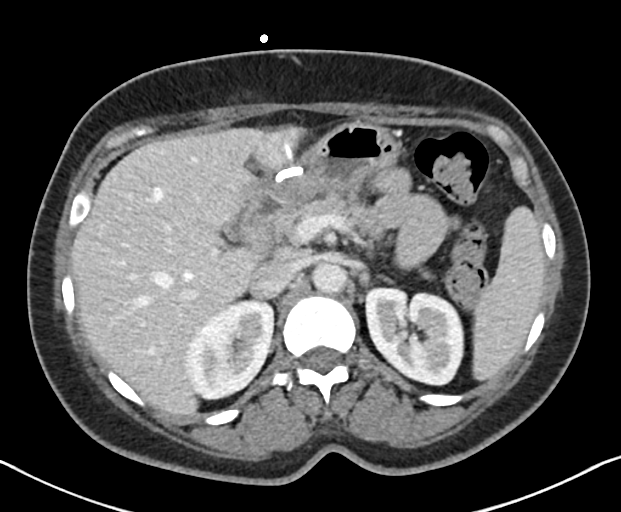
[im 80/98  soft-tissue]
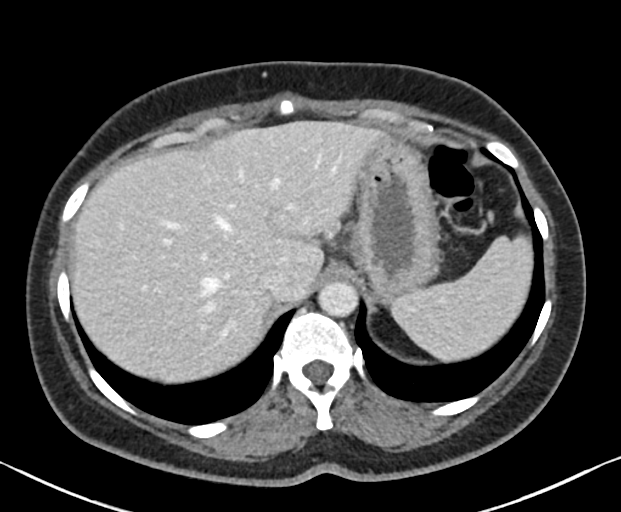
[im 89/98  soft-tissue]
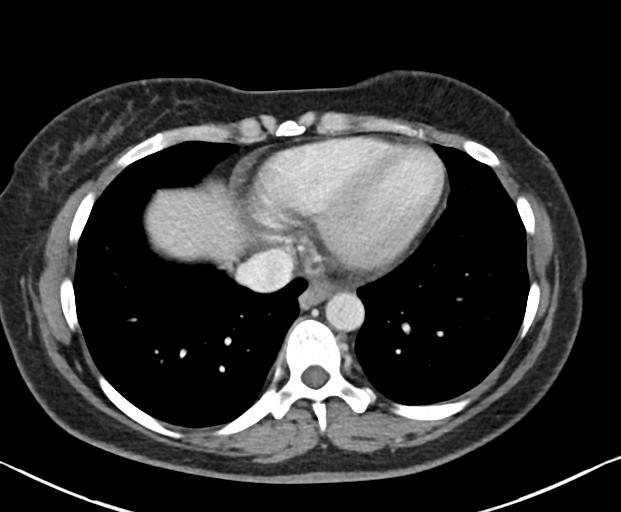
[im 89/98  bone]
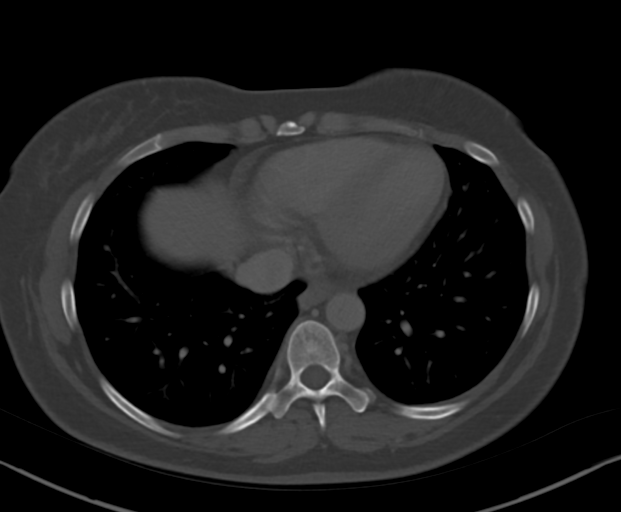

[Series 6: abd pelvis 2.00 br40 s3 cor · coronal · 0.70mm/px · 3 of 140 slices shown]
[im 47/140  soft-tissue]
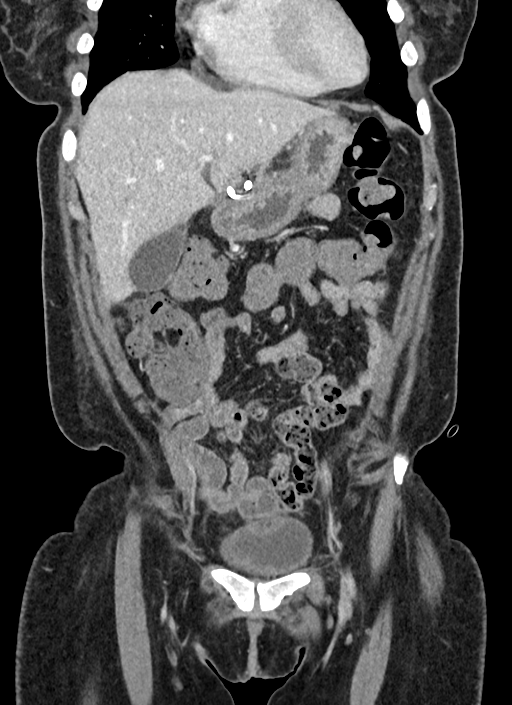
[im 62/140  soft-tissue]
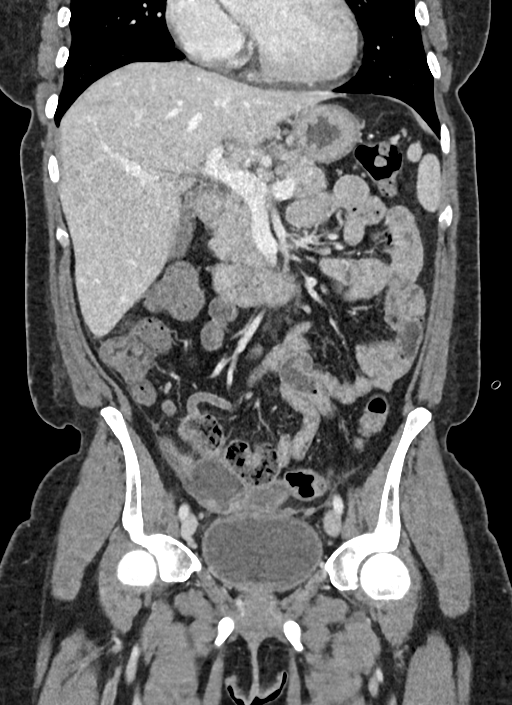
[im 78/140  soft-tissue]
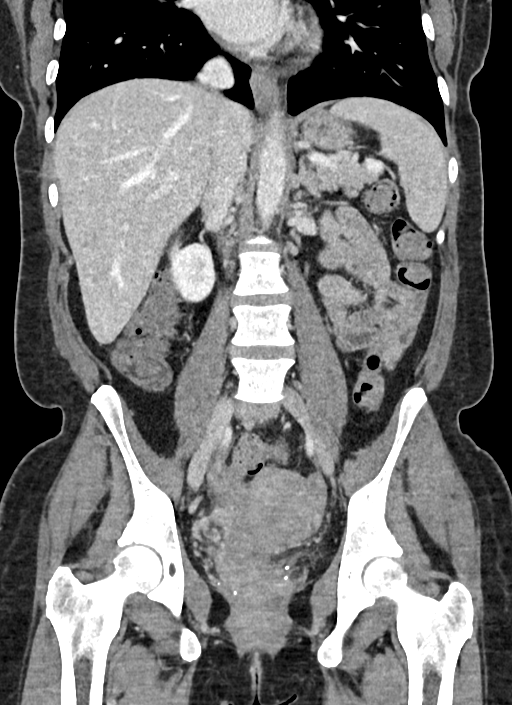

[12 of 46 positions shown; findings below may reference images not displayed]

RADIATION DOSE REDUCTION: This exam was performed according to the
departmental dose-optimization program which includes automated
exposure control, adjustment of the mA and/or kV according to
patient size and/or use of iterative reconstruction technique.

CONTRAST:  100mL 26TQ0Q-XDD IOPAMIDOL (26TQ0Q-XDD) INJECTION 61%
FINDINGS: Lower chest: Redemonstration of mixed cavitation in ground-glass
opacity at the right lower lobe periphery, similar to recent CT.
Greatest diameter 10 mm on the current, decreasing in size. No new
acute abnormality of the lung base.

Hepatobiliary: Percutaneous drainage catheter in the subxiphoid
region is unchanged in position. There has been complete resolution
of the previous abscess/fluid. No new abscess formation. Tiny
hypodensity in the right liver most compatible with a benign biliary
cyst. Unremarkable gallbladder.

Pancreas: Unremarkable, with interval resolution of prior
inflammatory changes. No calcifications or radiopaque foci that
would be suspicious of stones in the duct. No focal fluid.

Spleen: Unremarkable

Adrenals/Urinary Tract:

- Right adrenal gland:  Unremarkable

- Left adrenal gland: Unremarkable.

- Right kidney: No hydronephrosis, nephrolithiasis, inflammation, or
ureteral dilation. No focal lesion.

- Left Kidney: No hydronephrosis, nephrolithiasis, inflammation, or
ureteral dilation. No focal lesion.

- Urinary Bladder: Unremarkable.

Stomach/Bowel:

- Stomach: Unremarkable.

- Small bowel: Unremarkable

- Appendix: Normal.

- Colon: Fluid collection in the recto uterine space has nearly
completely resolved, now measuring 11 mm transversely. Otherwise
unremarkable colon.

Vascular/Lymphatic: Minimal atherosclerotic changes of the abdominal
aorta. Mesenteric and renal arteries patent. Bilateral iliac and
proximal femoral arteries patent.

Small lymph nodes in the inguinal region. No mesenteric or
retroperitoneal adenopathy.

Reproductive: Unremarkable uterus.  Unremarkable left adnexa.

Redemonstration of low-density and nonenhancing/in non complex right
ovarian cystic measuring 3.5 cm. Previously measuring 4.2 cm.

Other: None

Musculoskeletal: Mild degenerative changes of the spine. No acute
displaced fracture.
IMPRESSION: Unchanged position of pigtail drain at the deep margin of the left
liver, with complete resolution of the previous fluid/abscess. No
new focal fluid or inflammatory changes.

Interval resolution of inflammation adjacent to the pancreas.

Near complete resolution of the fluid in the recto uterine space.

Slightly decreased size of the partially cavitary lesion at the
right lung base. While mostly related to focal
infection/inflammation, follow-up CT recommended within 6 months to
assure resolution.

Redemonstration of right ovarian cyst, now measuring approximately
3.5 cm. As previously noted, follow-up ultrasound is recommended in
3-6 months.

## 2023-01-29 ENCOUNTER — Encounter (HOSPITAL_COMMUNITY): Payer: Self-pay

## 2023-01-29 ENCOUNTER — Other Ambulatory Visit: Payer: Self-pay

## 2023-01-29 ENCOUNTER — Emergency Department (HOSPITAL_COMMUNITY): Payer: 59

## 2023-01-29 ENCOUNTER — Emergency Department (HOSPITAL_COMMUNITY)
Admission: EM | Admit: 2023-01-29 | Discharge: 2023-01-29 | Disposition: A | Discharge status: Home / Self Care | Payer: 59 | Attending: Emergency Medicine | Admitting: Emergency Medicine

## 2023-01-29 DIAGNOSIS — I10 Essential (primary) hypertension: Secondary | ICD-10-CM | POA: Diagnosis not present

## 2023-01-29 DIAGNOSIS — R0789 Other chest pain: Secondary | ICD-10-CM | POA: Diagnosis not present

## 2023-01-29 DIAGNOSIS — R079 Chest pain, unspecified: Secondary | ICD-10-CM | POA: Diagnosis present

## 2023-01-29 DIAGNOSIS — Z7982 Long term (current) use of aspirin: Secondary | ICD-10-CM | POA: Insufficient documentation

## 2023-01-29 DIAGNOSIS — Z79899 Other long term (current) drug therapy: Secondary | ICD-10-CM | POA: Insufficient documentation

## 2023-01-29 DIAGNOSIS — E119 Type 2 diabetes mellitus without complications: Secondary | ICD-10-CM | POA: Diagnosis not present

## 2023-01-29 LAB — BASIC METABOLIC PANEL
Anion gap: 10 (ref 5–15)
BUN: 10 mg/dL (ref 6–20)
CO2: 22 mmol/L (ref 22–32)
Calcium: 8.5 mg/dL — ABNORMAL LOW (ref 8.9–10.3)
Chloride: 104 mmol/L (ref 98–111)
Creatinine, Ser: 0.79 mg/dL (ref 0.44–1.00)
GFR, Estimated: >60 mL/min (ref >60)
Glucose, Bld: 136 mg/dL — ABNORMAL HIGH (ref 70–99)
Potassium: 3.1 mmol/L — ABNORMAL LOW (ref 3.5–5.1)
Sodium: 136 mmol/L (ref 135–145)

## 2023-01-29 LAB — CBC
HCT: 43.6 % (ref 36.0–46.0)
Hemoglobin: 14.3 g/dL (ref 12.0–15.0)
MCH: 28.3 pg (ref 26.0–34.0)
MCHC: 32.8 g/dL (ref 30.0–36.0)
MCV: 86.2 fL (ref 80.0–100.0)
Platelets: 225 10*3/uL (ref 150–400)
RBC: 5.06 MIL/uL (ref 3.87–5.11)
RDW: 14.4 % (ref 11.5–15.5)
WBC: 6.6 10*3/uL (ref 4.0–10.5)
nRBC: 0 % (ref 0.0–0.2)

## 2023-01-29 LAB — TROPONIN I (HIGH SENSITIVITY): Troponin I (High Sensitivity): <2 ng/L (ref <18)

## 2023-01-29 LAB — GROUP A STREP BY PCR: Group A Strep by PCR: NOT DETECTED

## 2023-01-29 NOTE — ED Notes (Signed)
Ent trauma pg and neurosurg.

## 2023-01-29 NOTE — ED Notes (Signed)
Patient left prior to updated vitals and prior to me being able to give her the discharge paperwork.

## 2023-01-29 NOTE — ED Provider Notes (Signed)
[place] EMERGENCY DEPARTMENT AT [facility] Provider Note   CSN: [id number] Arrival date & time: 01/29/23  1611     History  Chief Complaint  Patient presents with   Chest Pain   Sore Throat    Crystal Mora is a 47 y.o. female.  Patient here with burning chest pain and throat pain for about 1 day.  Daughter with strep throat.  Not having any active symptoms now.  Denies any fever or chills.  History of hypertension diabetes.  Denies any exertional chest pain in the past.  Has been fairly healthy otherwise.  Denies any abdominal pain nausea vomiting diarrhea.  The history is provided by the patient.       Home Medications Prior to Admission medications   Medication Sig Start Date End Date Taking? Authorizing Provider  acetaminophen (TYLENOL) 325 MG tablet Take 2 tablets (650 mg total) by mouth every 6 (six) hours as needed for mild pain. 08/13/22   Barrett, Erin R, PA-C  amLODipine (NORVASC) 2.5 MG tablet Take 2.5 mg by mouth in the morning.    [provider]  aspirin EC 81 MG tablet Take 81 mg by mouth in the morning.    [provider]  Bacillus Coagulans-Inulin (BENEFIBER PREBIOTIC+PROBIOTIC) CHEW Chew 3 tablets by mouth in the morning and at bedtime.    [provider]  diazepam (VALIUM) 5 MG tablet Take 5 mg by mouth in the morning and at bedtime.    [provider]  diphenhydrAMINE (BENADRYL) 25 mg capsule Take 25 mg by mouth 2 (two) times daily as needed for itching.    [provider]  EPINEPHrine (EPIPEN 2-PAK) 0.3 mg/0.3 mL IJ SOAJ injection Inject 0.3 mLs (0.3 mg total) into the muscle once as needed for up to 1 dose (for severe allergic reaction). CAll 911 immediately if you have to use this medicine 09/24/18   Garlon Hatchet, PA-C  estradiol [facility] - DOSED IN MG/24 HR) 0.05 mg/24hr patch Place 0.05 mg onto the skin every Sunday.    [provider]  gabapentin (NEURONTIN) 300 MG capsule Take 300 mg  by mouth at bedtime. 08/14/22   [provider]  HYDROcodone-acetaminophen (NORCO) 7.5-325 MG tablet Take 1 tablet by mouth 3 (three) times daily as needed (pain.). 04/19/22   [provider]  hydrOXYzine (ATARAX) 50 MG tablet Take 50 mg by mouth every 8 (eight) hours as needed for anxiety.    [provider]  ketotifen (ALLERGY EYE DROPS) 0.035 % ophthalmic solution Place 1 drop into both eyes in the morning and at bedtime.    [provider]  losartan (COZAAR) 100 MG tablet Take 100 mg by mouth in the morning.    [provider]  mirtazapine (REMERON) 15 MG tablet Take 15 mg by mouth at bedtime. 08/14/22   [provider]  Multiple Vitamins-Minerals (ADULT GUMMY PO) Take 3 each by mouth in the morning.    [provider]  ondansetron (ZOFRAN) 4 MG tablet Take 1 tablet (4 mg total) by mouth every 8 (eight) hours as needed for nausea or vomiting. 08/14/22   Barrett, Erin R, PA-C  pantoprazole (PROTONIX) 40 MG tablet Take 1 tablet (40 mg total) by mouth 2 (two) times daily. 08/09/22   Armbruster, Willaim Rayas, MD  RESTASIS 0.05 % ophthalmic emulsion Place 1 drop into both eyes 2 (two) times daily. 12/20/21   [provider]  rosuvastatin (CRESTOR) 5 MG tablet Take 5 mg by  mouth in the morning. 10/13/21   [provider]  sucralfate (CARAFATE) 1 GM/10ML suspension Take 10 mLs (1 g total) by mouth 4 (four) times daily. 07/18/22   Armbruster, Willaim Rayas, MD  XOLAIR 150 MG/ML prefilled syringe Inject 150 mg into the skin every 14 (fourteen) days. 07/31/22   [provider]      Allergies    Biaxin [clarithromycin], Morphine and codeine, Percocet [oxycodone-acetaminophen], Shrimp [shellfish allergy], Celexa [citalopram], and Topamax [topiramate]    Review of Systems   Review of Systems  Physical Exam Updated Vital Signs BP 131/86 (BP Location: Right Arm)   Pulse 76   Temp 98.1 F (36.7 C) (Oral)   Resp 18   Ht 5\' 4"  (1.626  m)   Wt 81 kg   LMP 02/13/2020 (Approximate)   SpO2 96%   BMI 30.65 kg/m  Physical Exam Vitals and nursing note reviewed.  Constitutional:      General: She is not in acute distress.    Appearance: She is well-developed. She is not ill-appearing.  HENT:     Head: Normocephalic and atraumatic.  Eyes:     Extraocular Movements: Extraocular movements intact.     Conjunctiva/sclera: Conjunctivae normal.     Pupils: Pupils are equal, round, and reactive to light.  Cardiovascular:     Rate and Rhythm: Normal rate and regular rhythm.     Pulses:          Radial pulses are 2+ on the right side and 2+ on the left side.     Heart sounds: Normal heart sounds. No murmur heard. Pulmonary:     Effort: Pulmonary effort is normal. No respiratory distress.     Breath sounds: Normal breath sounds. No decreased breath sounds.  Abdominal:     Palpations: Abdomen is soft.     Tenderness: There is no abdominal tenderness.  Musculoskeletal:        General: No swelling.     Cervical back: Normal range of motion and neck supple.  Skin:    General: Skin is warm and dry.     Capillary Refill: Capillary refill takes less than 2 seconds.  Neurological:     Mental Status: She is alert.  Psychiatric:        Mood and Affect: Mood normal.     ED Results / Procedures / Treatments   Labs (all labs ordered are listed, but only abnormal results are displayed) Labs Reviewed  BASIC METABOLIC PANEL - Abnormal; Notable for the following components:      Result Value   Potassium 3.1 (*)    Glucose, Bld 136 (*)    Calcium 8.5 (*)    All other components within normal limits  GROUP A STREP BY PCR  CBC  TROPONIN I (HIGH SENSITIVITY)    EKG EKG Interpretation  Date/Time:  Tuesday January 29 2023 16:56:04 EDT Ventricular Rate:  73 PR Interval:  150 QRS Duration: 88 QT Interval:  394 QTC Calculation: 434 R Axis:   48 Text Interpretation: Normal sinus rhythm When compared with ECG of 08-Aug-2022 13:56,  PREVIOUS ECG IS PRESENT Confirmed by Virgina Norfolk ([id number]) on 01/29/2023 6:34:09 PM  Radiology DG Chest 2 View  Result Date: 01/29/2023 CLINICAL DATA:  Chest pain. Sore throat for 1 day. Smoker. Right lower lobectomy. EXAM: CHEST - 2 VIEW COMPARISON:  08/14/2022 FINDINGS: Volume loss in the right hemithorax with tracheal deviation right. Normal heart size. Moderate right hemidiaphragm elevation. Trace right pleural thickening or less likely  fluid on the lateral view. Improvement in right apical pleural thickening. No pneumothorax or superimposed cardiopulmonary process. IMPRESSION: Expected appearance after right lower lobectomy. No superimposed process or explanation for chest pain. Electronically Signed   By: Jeronimo Greaves M.D.   On: 01/29/2023 18:32    Procedures Procedures    Medications Ordered in ED Medications - No data to display  ED Course/ Medical Decision Making/ A&P                             Medical Decision Making Amount and/or Complexity of Data Reviewed Labs: ordered. Radiology: ordered.   Darel Hong is here with chest pain and sore throat.  Normal vitals.  No fever.  History of diabetes and hypertension.  Exposure to strep recently.  Strep test negative.  Throat exam is unremarkable.  Chest pain not occurring now.  Somewhat atypical.  Evaluated for ACS, pneumonia, electrolyte abnormality, anemia and per my review interpretation labs troponins normal.  Chest x-ray without evidence of pneumonia or pneumothorax.  No significant anemia or electrolyte abnormality or kidney injury otherwise.  She has no PE risk factors.  Overall patient very well-appearing.  Discharged in good condition.  EKG shows sinus rhythm.  No ischemic changes.  This chart was dictated using voice recognition software.  Despite best efforts to proofread,  errors can occur which can change the documentation meaning.         Final Clinical Impression(s) / ED Diagnoses Final diagnoses:  Atypical  chest pain    Rx / DC Orders ED Discharge Orders     None         Virgina Norfolk, DO 01/29/23 1951

## 2023-01-29 NOTE — ED Triage Notes (Signed)
Pt c/o central chest and throat burning x1 day.  Pain score 8/10.  Pt reports her daughter recently had strep.

## 2023-02-08 ENCOUNTER — Other Ambulatory Visit: Payer: Self-pay | Admitting: Gastroenterology

## 2023-02-08 DIAGNOSIS — R1010 Upper abdominal pain, unspecified: Secondary | ICD-10-CM

## 2023-02-08 DIAGNOSIS — D509 Iron deficiency anemia, unspecified: Secondary | ICD-10-CM

## 2023-02-08 DIAGNOSIS — Z8711 Personal history of peptic ulcer disease: Secondary | ICD-10-CM

## 2023-02-28 ENCOUNTER — Other Ambulatory Visit: Payer: 59

## 2023-03-01 ENCOUNTER — Encounter (HOSPITAL_COMMUNITY): Payer: Self-pay

## 2023-03-01 ENCOUNTER — Inpatient Hospital Stay: Payer: 59 | Attending: Internal Medicine

## 2023-03-01 ENCOUNTER — Ambulatory Visit (HOSPITAL_COMMUNITY)
Admission: RE | Admit: 2023-03-01 | Discharge: 2023-03-01 | Disposition: A | Discharge status: Home / Self Care | Payer: 59 | Source: Ambulatory Visit | Attending: Internal Medicine | Admitting: Internal Medicine

## 2023-03-01 ENCOUNTER — Other Ambulatory Visit: Payer: Self-pay

## 2023-03-01 DIAGNOSIS — C3490 Malignant neoplasm of unspecified part of unspecified bronchus or lung: Secondary | ICD-10-CM | POA: Insufficient documentation

## 2023-03-01 DIAGNOSIS — F17210 Nicotine dependence, cigarettes, uncomplicated: Secondary | ICD-10-CM | POA: Diagnosis not present

## 2023-03-01 DIAGNOSIS — C3431 Malignant neoplasm of lower lobe, right bronchus or lung: Secondary | ICD-10-CM | POA: Diagnosis present

## 2023-03-01 DIAGNOSIS — C782 Secondary malignant neoplasm of pleura: Secondary | ICD-10-CM | POA: Diagnosis not present

## 2023-03-01 LAB — CBC WITH DIFFERENTIAL (CANCER CENTER ONLY)
Basophils Absolute: 0 10*3/uL (ref 0.0–0.1)
Basophils Relative: 0 %
Eosinophils Absolute: 0.2 10*3/uL (ref 0.0–0.5)
Eosinophils Relative: 5 %
HCT: 43 % (ref 36.0–46.0)
Hemoglobin: 14 g/dL (ref 12.0–15.0)
Lymphocytes Relative: 31 %
Lymphs Abs: 1.3 10*3/uL (ref 0.7–4.0)
MCH: 28.5 pg (ref 26.0–34.0)
MCHC: 32.6 g/dL (ref 30.0–36.0)
MCV: 87.4 fL (ref 80.0–100.0)
Monocytes Absolute: 0.5 10*3/uL (ref 0.1–1.0)
Monocytes Relative: 11 %
Neutro Abs: 2.3 10*3/uL (ref 1.7–7.7)
Neutrophils Relative %: 52 %
Platelet Count: 214 10*3/uL (ref 150–400)
RBC: 4.92 MIL/uL (ref 3.87–5.11)
RDW: 13.2 % (ref 11.5–15.5)
WBC Count: 4.3 10*3/uL (ref 4.0–10.5)
nRBC: 0 % (ref 0.0–0.2)

## 2023-03-01 LAB — CMP (CANCER CENTER ONLY)
ALT: 14 U/L (ref 0–44)
AST: 15 U/L (ref 15–41)
Albumin: 3.7 g/dL (ref 3.5–5.0)
Alkaline Phosphatase: 99 U/L (ref 38–126)
Anion gap: 6 (ref 5–15)
BUN: 11 mg/dL (ref 6–20)
CO2: 33 mmol/L — ABNORMAL HIGH (ref 22–32)
Calcium: 9.2 mg/dL (ref 8.9–10.3)
Chloride: 104 mmol/L (ref 98–111)
Creatinine: 0.76 mg/dL (ref 0.44–1.00)
GFR, Estimated: >60 mL/min (ref >60)
Glucose, Bld: 109 mg/dL — ABNORMAL HIGH (ref 70–99)
Potassium: 3.6 mmol/L (ref 3.5–5.1)
Sodium: 143 mmol/L (ref 135–145)
Total Bilirubin: 0.3 mg/dL (ref 0.3–1.2)
Total Protein: 6.9 g/dL (ref 6.5–8.1)

## 2023-03-01 MED ORDER — IOHEXOL 300 MG/ML  SOLN
75.0000 mL | Freq: Once | INTRAMUSCULAR | Status: AC | PRN
  Administered 2023-03-01: 75 mL via INTRAVENOUS

## 2023-03-04 ENCOUNTER — Inpatient Hospital Stay: Payer: 59 | Admitting: Internal Medicine

## 2023-03-12 ENCOUNTER — Telehealth: Payer: Self-pay | Admitting: Internal Medicine

## 2023-03-12 NOTE — Telephone Encounter (Signed)
Patient is aware of rescheduled appointment times/dates 

## 2023-03-14 ENCOUNTER — Emergency Department (HOSPITAL_COMMUNITY): Payer: 59

## 2023-03-14 ENCOUNTER — Other Ambulatory Visit: Payer: Self-pay

## 2023-03-14 ENCOUNTER — Emergency Department (HOSPITAL_COMMUNITY)
Admission: EM | Admit: 2023-03-14 | Discharge: 2023-03-14 | Disposition: A | Discharge status: Home / Self Care | Payer: 59 | Attending: Emergency Medicine | Admitting: Emergency Medicine

## 2023-03-14 ENCOUNTER — Encounter (HOSPITAL_COMMUNITY): Payer: Self-pay

## 2023-03-14 DIAGNOSIS — E119 Type 2 diabetes mellitus without complications: Secondary | ICD-10-CM | POA: Diagnosis not present

## 2023-03-14 DIAGNOSIS — I10 Essential (primary) hypertension: Secondary | ICD-10-CM | POA: Diagnosis not present

## 2023-03-14 DIAGNOSIS — S60512A Abrasion of left hand, initial encounter: Secondary | ICD-10-CM | POA: Diagnosis not present

## 2023-03-14 DIAGNOSIS — Z7982 Long term (current) use of aspirin: Secondary | ICD-10-CM | POA: Diagnosis not present

## 2023-03-14 DIAGNOSIS — Y92410 Unspecified street and highway as the place of occurrence of the external cause: Secondary | ICD-10-CM | POA: Insufficient documentation

## 2023-03-14 DIAGNOSIS — M25532 Pain in left wrist: Secondary | ICD-10-CM | POA: Diagnosis not present

## 2023-03-14 DIAGNOSIS — Z79899 Other long term (current) drug therapy: Secondary | ICD-10-CM | POA: Diagnosis not present

## 2023-03-14 DIAGNOSIS — S6992XA Unspecified injury of left wrist, hand and finger(s), initial encounter: Secondary | ICD-10-CM | POA: Diagnosis present
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
## 2023-03-19 ENCOUNTER — Other Ambulatory Visit: Payer: Self-pay

## 2023-03-19 ENCOUNTER — Inpatient Hospital Stay: Payer: 59 | Attending: Internal Medicine | Admitting: Internal Medicine

## 2023-03-19 VITALS — BP 119/70 | HR 96 | Temp 98.0°F | Resp 18 | Wt 186.6 lb

## 2023-03-19 DIAGNOSIS — Z85118 Personal history of other malignant neoplasm of bronchus and lung: Secondary | ICD-10-CM | POA: Insufficient documentation

## 2023-03-19 DIAGNOSIS — C3490 Malignant neoplasm of unspecified part of unspecified bronchus or lung: Secondary | ICD-10-CM

## 2023-03-19 DIAGNOSIS — Z902 Acquired absence of lung [part of]: Secondary | ICD-10-CM | POA: Diagnosis not present

## 2023-03-19 DIAGNOSIS — Z08 Encounter for follow-up examination after completed treatment for malignant neoplasm: Secondary | ICD-10-CM | POA: Insufficient documentation
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
## 2023-04-11 ENCOUNTER — Other Ambulatory Visit: Payer: Self-pay | Admitting: Gastroenterology

## 2023-06-04 ENCOUNTER — Ambulatory Visit (HOSPITAL_COMMUNITY)
Admission: EM | Admit: 2023-06-04 | Discharge: 2023-06-04 | Disposition: A | Discharge status: Home / Self Care | Payer: 59 | Attending: Internal Medicine | Admitting: Internal Medicine

## 2023-06-04 ENCOUNTER — Encounter (HOSPITAL_COMMUNITY): Payer: Self-pay | Admitting: Emergency Medicine

## 2023-06-04 DIAGNOSIS — L732 Hidradenitis suppurativa: Secondary | ICD-10-CM

## 2023-06-04 MED ORDER — DOXYCYCLINE HYCLATE 100 MG PO CAPS: 100.0000 mg | ORAL_CAPSULE | Freq: Two times a day (BID) | ORAL | 0 refills | Status: AC

## 2023-06-04 NOTE — ED Triage Notes (Signed)
Has abscess on RUQ and left axilla that have gotten  worse over the past 3 days. Using hibiclens and epsom soaks. Tried popping the one in left axilla. Had @ [facility] powders but still not helping with pain

## 2023-06-04 NOTE — ED Provider Notes (Signed)
[facility]    CSN: [id number] Arrival date & time: 06/04/23  1318      History   Chief Complaint Chief Complaint  Patient presents with   Abscess    HPI Crystal Mora is a 47 y.o. female.   Patient presents to urgent care for evaluation of abscess to the left axilla and right upper quadrant abdomen that started a few days ago. History of hidradenitis suppurativa. Axillary abscess started 2 days ago and has grown significantly in size/tenderness over the last 24 hours. She attempted to squeeze the axillary abscess without success at home. Abscesses are non-draining. Abdominal abscess is in the very early stages and is indurated, axillary abscess is mature. She has been using Hibiclens, warm compresses, epsom salt soaks, etc to help with abscess and states this has helped abscess to become more fluctuant. No fevers, chills, N/V. History of type 2 diabetes, does not currently take medicines for DM as last HA1C was between 5-6. PCP manages diabetes.   Abscess   Past Medical History:  Diagnosis Date   Anxiety    Bipolar 1 disorder (HCC)    Chronic urticaria    Depression    Diabetes mellitus without complication (HCC)    Pt states prediabetic   Hypertension    states she has not took BP medication for 2 years   Insomnia    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   Thyroid disease     Patient Active Problem List   Diagnosis Date Noted   Adenocarcinoma of right lung, stage 1 (HCC) 09/03/2022   Lung nodule 08/10/2022   Pulmonary nodule 08/10/2022   S/P lobectomy of lung 08/10/2022   Penicillin allergy 01/23/2022   Intra-abdominal abscess (HCC) 01/19/2022   Abscess of multiple sites 01/02/2022   Cavitary lesion of lung 01/02/2022   Immunosuppressed status (HCC) 01/02/2022   Peptic ulcer disease 01/02/2022   Abnormal CT of the abdomen 01/02/2022   Premature menopause on hormone replacement therapy 01/02/2022   Anxiety 01/02/2022   Diarrhea 01/02/2022    Abdominal pain 01/02/2022   SIRS (systemic inflammatory response syndrome) (HCC) 01/02/2022   Diabetes (HCC) 01/02/2022   Dyslipidemia 01/02/2022   Pancreatic cyst    Gastric ulcer without hemorrhage or perforation    Leukocytosis    Acute gastric perforation 12/25/2021   Epigastric pain 12/22/2021   Peroneal tendinitis 12/06/2021   Chronic buttock pain 07/26/2021   Arthropathy of lumbar facet joint 10/10/2020   Bipolar 2 disorder, major depressive episode (HCC) 03/30/2020   Other bipolar disorder (HCC) 03/30/2020   GAD (generalized anxiety disorder) 03/30/2020   Hearing loss 04/30/2019   IFG (impaired fasting glucose) 04/30/2019   Overweight with body mass index (BMI) of 28 to 28.9 in adult 01/28/2019   Cervical radiculopathy 01/06/2019   Depression 10/30/2018   Essential hypertension 10/30/2018   Mixed hyperlipidemia 10/30/2018   History of thyroidectomy, subtotal 09/10/2018   Lumbar degenerative disc disease 09/05/2018   Chronic migraine w/o aura w/o status migrainosus, not intractable 06/19/2018   Chronic back pain 05/30/2018   Menorrhagia 11/25/2017   Iron deficiency 10/03/2017   Pulsatile tinnitus of left ear 07/18/2017   Sensorineural hearing loss (SNHL), bilateral 07/18/2017   Cesarean deliv due to previous difficult deliv, deliv, curr hospitaliz 01/02/2017   Chronic narcotic use 06/29/2016   History of eclampsia 06/29/2016   History of reversal of tubal ligation 03/28/2016   Anemia 01/12/2016   GERD (gastroesophageal reflux disease) 11/10/2015   Analgesic rebound headache  07/25/2015   Chronic urticaria 07/25/2015   Hidradenitis suppurativa 07/25/2015   Hyperopia of both eyes with astigmatism and presbyopia 07/25/2015   Meibomian gland dysfunction (MGD) of upper and lower lids of both eyes 07/25/2015   Psychiatric disorder 07/25/2015    Past Surgical History:  Procedure Laterality Date   BRONCHIAL BRUSHINGS  08/10/2022   Procedure: BRONCHIAL BRUSHINGS;  Surgeon:  Josephine Igo, DO;  [facility];  Service: Pulmonary;;   BRONCHIAL NEEDLE ASPIRATION BIOPSY  08/10/2022   Procedure: BRONCHIAL NEEDLE ASPIRATION BIOPSIES;  Surgeon: Josephine Igo, DO;  [facility];  Service: Pulmonary;;   CESAREAN SECTION     times 5   FIDUCIAL MARKER PLACEMENT  08/10/2022   Procedure: FIDUCIAL dye marker;  Surgeon: Josephine Igo, DO;  [facility]: [facility];  Service: Pulmonary;;   INTERCOSTAL NERVE BLOCK Right 08/10/2022   Procedure: INTERCOSTAL NERVE BLOCK;  Surgeon: Corliss Skains, MD;  Location: [facility];  Service: Thoracic;  Laterality: Right;   IR RADIOLOGIST EVAL & MGMT  01/18/2022   LYMPH NODE DISSECTION Right 08/10/2022   Procedure: LYMPH NODE DISSECTION;  Surgeon: Corliss Skains, MD;  Location: [facility];  Service: Thoracic;  Laterality: Right;   THYROID SURGERY     TUBAL LIGATION     tubal reversal Bilateral     OB History     Gravida  14   Para  8   Term  3   Preterm  5   AB  3   Living  8      SAB  1   IAB  2   Ectopic  0   Multiple  1   Live Births  8            Home Medications    Prior to Admission medications   Medication Sig Start Date End Date Taking? Authorizing Provider  doxycycline (VIBRAMYCIN) 100 MG capsule Take 1 capsule (100 mg total) by mouth 2 (two) times daily for 7 days. 06/04/23 06/11/23 Yes StanhopeDonavan Burnet, FNP  acetaminophen (TYLENOL) 325 MG tablet Take 2 tablets (650 mg total) by mouth every 6 (six) hours as needed for mild pain. 08/13/22   Barrett, Erin R, PA-C  amLODipine (NORVASC) 2.5 MG tablet Take 2.5 mg by mouth in the morning.    [provider]  aspirin EC 81 MG tablet Take 81 mg by mouth in the morning.    [provider]  Bacillus Coagulans-Inulin (BENEFIBER PREBIOTIC+PROBIOTIC) CHEW Chew 3 tablets by mouth in the morning and at bedtime.    [provider]  diazepam (VALIUM) 5 MG tablet Take 5 mg by mouth in the morning and at  bedtime.    [provider]  diphenhydrAMINE (BENADRYL) 25 mg capsule Take 25 mg by mouth 2 (two) times daily as needed for itching.    [provider]  EPINEPHrine (EPIPEN 2-PAK) 0.3 mg/0.3 mL IJ SOAJ injection Inject 0.3 mLs (0.3 mg total) into the muscle once as needed for up to 1 dose (for severe allergic reaction). CAll 911 immediately if you have to use this medicine 09/24/18   Garlon Hatchet, PA-C  estradiol [facility] - DOSED IN MG/24 HR) 0.05 mg/24hr patch Place 0.05 mg onto the skin every Sunday.    [provider]  gabapentin (NEURONTIN) 300 MG capsule Take 300 mg by mouth at bedtime. 08/14/22   [provider]  HYDROcodone-acetaminophen (NORCO) 7.5-325 MG tablet Take 1 tablet by mouth 3 (three) times daily  as needed (pain.). 04/19/22   [provider]  hydrOXYzine (ATARAX) 50 MG tablet Take 50 mg by mouth every 8 (eight) hours as needed for anxiety.    [provider]  ketotifen (ALLERGY EYE DROPS) 0.035 % ophthalmic solution Place 1 drop into both eyes in the morning and at bedtime.    [provider]  losartan (COZAAR) 100 MG tablet Take 100 mg by mouth in the morning.    [provider]  mirtazapine (REMERON) 15 MG tablet Take 15 mg by mouth at bedtime. 08/14/22   [provider]  Multiple Vitamins-Minerals (ADULT GUMMY PO) Take 3 each by mouth in the morning.    [provider]  ondansetron (ZOFRAN) 4 MG tablet Take 1 tablet (4 mg total) by mouth every 8 (eight) hours as needed for nausea or vomiting. 08/14/22   Barrett, Erin R, PA-C  pantoprazole (PROTONIX) 40 MG tablet TAKE 1 TABLET BY MOUTH TWICE A DAY 02/08/23   Armbruster, Willaim Rayas, MD  RESTASIS 0.05 % ophthalmic emulsion Place 1 drop into both eyes 2 (two) times daily. 12/20/21   [provider]  rosuvastatin (CRESTOR) 5 MG tablet Take 5 mg by mouth in the morning. 10/13/21   [provider]  sucralfate (CARAFATE) 1 GM/10ML suspension  Take 10 mLs (1 g total) by mouth 4 (four) times daily. 07/18/22   Armbruster, Willaim Rayas, MD  XOLAIR 150 MG/ML prefilled syringe Inject 150 mg into the skin every 14 (fourteen) days. 07/31/22   [provider]    Family History Family History  Problem Relation Age of Onset   Depression Mother    Mental illness Mother    Asthma Mother    COPD Mother    Diabetes Mother    Mental illness Brother    Mental illness Daughter    Diabetes Maternal Aunt    Mental illness Maternal Aunt    Depression Maternal Aunt    Diabetes Maternal Grandmother     Social History Social History   Tobacco Use   Smoking status: Former    Current packs/day: 0.50    Types: Cigars, Cigarettes    Quit date: 07/22/2022   Smokeless tobacco: Never   Tobacco comments:    Smokes 1-2 black and Mild every other day and vape every day. 06/14/2022 Tay  Vaping Use   Vaping status: Former   Quit date: 07/22/2022   Substances: Flavoring  Substance Use Topics   Alcohol use: Not Currently    Comment: Ocassional   Drug use: No     Allergies   Biaxin [clarithromycin], Morphine and codeine, Percocet [oxycodone-acetaminophen], Shrimp [shellfish allergy], Celexa [citalopram], and Topamax [topiramate]   Review of Systems Review of Systems Per HPI  Physical Exam Triage Vital Signs ED Triage Vitals  Encounter Vitals Group     BP 06/04/23 1329 122/82     Systolic BP Percentile --      Diastolic BP Percentile --      Pulse Rate 06/04/23 1329 85     Resp 06/04/23 1329 17     Temp 06/04/23 1329 (!) 97.5 F (36.4 C)     Temp Source 06/04/23 1329 Oral     SpO2 06/04/23 1329 98 %     Weight --      Height --      Head Circumference --      Peak Flow --      Pain Score 06/04/23 1327 10     Pain Loc --  Pain Education --      Exclude from Growth Chart --    No data found.  Updated Vital Signs BP 122/82 (BP Location: Right Arm)   Pulse 85   Temp (!) 97.5 F (36.4 C) (Oral)   Resp 17   LMP  02/13/2020 (Approximate)   SpO2 98%   Visual Acuity Right Eye Distance:   Left Eye Distance:   Bilateral Distance:    Right Eye Near:   Left Eye Near:    Bilateral Near:     Physical Exam Vitals and nursing note reviewed.  Constitutional:      Appearance: She is not ill-appearing or toxic-appearing.  HENT:     Head: Normocephalic and atraumatic.     Right Ear: Hearing and external ear normal.     Left Ear: Hearing and external ear normal.     Nose: Nose normal.     Mouth/Throat:     Lips: Pink.  Eyes:     General: Lids are normal. Vision grossly intact. Gaze aligned appropriately.     Extraocular Movements: Extraocular movements intact.     Conjunctiva/sclera: Conjunctivae normal.  Pulmonary:     Effort: Pulmonary effort is normal.  Musculoskeletal:     Cervical back: Neck supple.  Skin:    General: Skin is warm and dry.     Capillary Refill: Capillary refill takes less than 2 seconds.     Findings: Abscess present. No rash.       Neurological:     General: No focal deficit present.     Mental Status: She is alert and oriented to person, place, and time. Mental status is at baseline.     Cranial Nerves: No dysarthria or facial asymmetry.  Psychiatric:        Mood and Affect: Mood normal.        Speech: Speech normal.        Behavior: Behavior normal.        Thought Content: Thought content normal.        Judgment: Judgment normal.      UC Treatments / Results  Labs (all labs ordered are listed, but only abnormal results are displayed) Labs Reviewed - No data to display  EKG   Radiology No results found.  Procedures Procedures (including critical care time)  Medications Ordered in UC Medications - No data to display  Initial Impression / Assessment and Plan / UC Course  I have reviewed the triage vital signs and the nursing notes.  Pertinent labs & imaging results that were available during my care of the patient were reviewed by me and considered  in my medical decision making (see chart for details).   1. HS of multiple sites Left axillary abscess appears ready for I&D, offered this to patient today who declines and would prefer antibiotic therapy with continued use of warm compresses and Hibiclens. She may return to clinic whenever she would like to have this incised and drained if it does not drain on its own in the next 2-3 days. Doxycycline BID for 7 days. Follow-up with PCP as needed. She is on the wait list for an HS specialist dermatologist.   Counseled patient on potential for adverse effects with medications prescribed/recommended today, strict ER and return-to-clinic precautions discussed, patient verbalized understanding.    Final Clinical Impressions(s) / UC Diagnoses   Final diagnoses:  Hidradenitis suppurativa of multiple sites     Discharge Instructions      Your abscess has been  evaluated in the clinic and appears to be infected, but is not quite ready for drainage.   I would like for you to perform warm compresses to the area frequently and continue taking over the counter medications for pain and inflammation as directed.   Start taking antibiotic sent to pharmacy as directed. This will help reduce infection to area and help the abscess mature/drain.   If abscess does not begin to drain on its own over the next few days and becomes softer, please return to urgent care for this to be drained appropriately.   If you have any fever, chills, nausea, vomiting, or worsening pain/swelling to the area, please return to urgent care for reevaluation.     ED Prescriptions     Medication Sig Dispense Auth. Provider   doxycycline (VIBRAMYCIN) 100 MG capsule Take 1 capsule (100 mg total) by mouth 2 (two) times daily for 7 days. 14 capsule Carlisle Beers, FNP      PDMP not reviewed this encounter.   Carlisle Beers, [place] 06/04/23 1438

## 2023-06-04 NOTE — Discharge Instructions (Addendum)
 Your abscess has been evaluated in the clinic and appears to be infected, but is not quite ready for drainage.   I would like for you to perform warm compresses to the area frequently and continue taking over the counter medications for pain and inflammation as directed.   Start taking antibiotic sent to pharmacy as directed. This will help reduce infection to area and help the abscess mature/drain.   If abscess does not begin to drain on its own over the next few days and becomes softer, please return to urgent care for this to be drained appropriately.   If you have any fever, chills, nausea, vomiting, or worsening pain/swelling to the area, please return to urgent care for reevaluation.

## 2023-08-25 ENCOUNTER — Other Ambulatory Visit: Payer: Self-pay | Admitting: Gastroenterology

## 2023-08-25 DIAGNOSIS — Z8711 Personal history of peptic ulcer disease: Secondary | ICD-10-CM

## 2023-08-25 DIAGNOSIS — R1010 Upper abdominal pain, unspecified: Secondary | ICD-10-CM

## 2023-08-25 DIAGNOSIS — D509 Iron deficiency anemia, unspecified: Secondary | ICD-10-CM

## 2023-09-12 ENCOUNTER — Ambulatory Visit (HOSPITAL_COMMUNITY)
Admission: RE | Admit: 2023-09-12 | Discharge: 2023-09-12 | Disposition: A | Discharge status: Home / Self Care | Payer: 59 | Source: Ambulatory Visit | Attending: Internal Medicine | Admitting: Internal Medicine

## 2023-09-12 ENCOUNTER — Inpatient Hospital Stay: Payer: 59 | Attending: Internal Medicine

## 2023-09-12 DIAGNOSIS — C3490 Malignant neoplasm of unspecified part of unspecified bronchus or lung: Secondary | ICD-10-CM

## 2023-09-12 DIAGNOSIS — Z85118 Personal history of other malignant neoplasm of bronchus and lung: Secondary | ICD-10-CM | POA: Diagnosis present

## 2023-09-12 LAB — CMP (CANCER CENTER ONLY)
ALT: 14 U/L (ref 0–44)
AST: 14 U/L — ABNORMAL LOW (ref 15–41)
Albumin: 3.8 g/dL (ref 3.5–5.0)
Alkaline Phosphatase: 91 U/L (ref 38–126)
Anion gap: 5 (ref 5–15)
BUN: 13 mg/dL (ref 6–20)
CO2: 31 mmol/L (ref 22–32)
Calcium: 8.8 mg/dL — ABNORMAL LOW (ref 8.9–10.3)
Chloride: 106 mmol/L (ref 98–111)
Creatinine: 0.81 mg/dL (ref 0.44–1.00)
GFR, Estimated: >60 mL/min (ref >60)
Glucose, Bld: 113 mg/dL — ABNORMAL HIGH (ref 70–99)
Potassium: 3.6 mmol/L (ref 3.5–5.1)
Sodium: 142 mmol/L (ref 135–145)
Total Bilirubin: 0.3 mg/dL (ref 0.0–1.2)
Total Protein: 6.9 g/dL (ref 6.5–8.1)

## 2023-09-12 LAB — CBC WITH DIFFERENTIAL (CANCER CENTER ONLY)
Abs Immature Granulocytes: 0.01 10*3/uL (ref 0.00–0.07)
Basophils Absolute: 0 10*3/uL (ref 0.0–0.1)
Basophils Relative: 1 %
Eosinophils Absolute: 0.1 10*3/uL (ref 0.0–0.5)
Eosinophils Relative: 2 %
HCT: 42.5 % (ref 36.0–46.0)
Hemoglobin: 13.8 g/dL (ref 12.0–15.0)
Immature Granulocytes: 0 %
Lymphocytes Relative: 25 %
Lymphs Abs: 1.5 10*3/uL (ref 0.7–4.0)
MCH: 27.1 pg (ref 26.0–34.0)
MCHC: 32.5 g/dL (ref 30.0–36.0)
MCV: 83.5 fL (ref 80.0–100.0)
Monocytes Absolute: 0.4 10*3/uL (ref 0.1–1.0)
Monocytes Relative: 7 %
Neutro Abs: 3.7 10*3/uL (ref 1.7–7.7)
Neutrophils Relative %: 65 %
Platelet Count: 225 10*3/uL (ref 150–400)
RBC: 5.09 MIL/uL (ref 3.87–5.11)
RDW: 13.7 % (ref 11.5–15.5)
WBC Count: 5.8 10*3/uL (ref 4.0–10.5)
nRBC: 0 % (ref 0.0–0.2)

## 2023-09-12 MED ORDER — IOHEXOL 300 MG/ML  SOLN
75.0000 mL | Freq: Once | INTRAMUSCULAR | Status: AC | PRN
  Administered 2023-09-12: 75 mL via INTRAVENOUS

## 2023-09-18 ENCOUNTER — Inpatient Hospital Stay: Payer: 59 | Attending: Internal Medicine | Admitting: Internal Medicine

## 2023-09-18 VITALS — BP 135/71 | HR 85 | Resp 16 | Ht 65.5 in | Wt 186.0 lb

## 2023-09-18 DIAGNOSIS — C3490 Malignant neoplasm of unspecified part of unspecified bronchus or lung: Secondary | ICD-10-CM

## 2023-09-18 DIAGNOSIS — Z85118 Personal history of other malignant neoplasm of bronchus and lung: Secondary | ICD-10-CM | POA: Insufficient documentation

## 2023-09-18 DIAGNOSIS — Z902 Acquired absence of lung [part of]: Secondary | ICD-10-CM | POA: Diagnosis not present

## 2023-09-18 NOTE — Progress Notes (Signed)
 [facility] [phone]   [phone]  OFFICE PROGRESS NOTE  Garwin Lum Fuse, PA-C No address on file  DIAGNOSIS: Stage Ib (T2a, N0, M0) non-small cell lung cancer, adenocarcinoma presented with cavitary right lower lobe lung nodule   PRIOR THERAPY: status post right lower lobectomy with lymph node sampling under the care of Dr. Shyrl on August 10, 2022 and there was visceral pleural involvement and the tumor was 1.1 cm in size.   CURRENT THERAPY: Observation  INTERVAL HISTORY: Crystal Mora 48 y.o. female returns to the clinic today for 36-month follow-up visit.Discussed the use of AI scribe software for clinical note transcription with the patient, who gave verbal consent to proceed.  History of Present Illness   Crystal Mora is a 48 year old female with stage 1B non-small cell lung cancer, adenocarcinoma, who presents for routine follow-up.  She was diagnosed with stage 1B non-small cell lung cancer, adenocarcinoma, in December 2023 and underwent a right lower lobectomy. Since the surgery, she has been under surveillance with regular imaging studies every few months. The last follow-up was six months ago.  Over the past six months, she has experienced increased shortness of breath and fatigue, feeling more out of breath and exhausted. Despite these symptoms, she maintains a stable weight and remains physically active, caring for her three grandchildren, which requires frequent rest due to exhaustion. Her oxygen saturation is 100% on room air. A recent scan was performed, but the formal report is pending.  There is a significant family history of cancer, including lung cancer in her uncle and aunt, both of whom had lung cancer. Her uncle's cancer recurred after initial treatment, and her aunt underwent a right lung resection but passed away a few months later. Other relatives, including her first cousin, grandmother, and  grandfather, have also had cancer. Her parents and siblings have not been affected.       Past Medical History:  Diagnosis Date   Anxiety    Bipolar 1 disorder (HCC)    Chronic urticaria    Depression    Diabetes mellitus without complication (HCC)    Pt states prediabetic   Hypertension    states she has not took BP medication for 2 years   Insomnia    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   Thyroid disease     ALLERGIES:  is allergic to biaxin [clarithromycin], morphine  and codeine, percocet [oxycodone -acetaminophen ], shrimp [shellfish allergy], celexa [citalopram], and topamax [topiramate].  MEDICATIONS:  Current Outpatient Medications  Medication Sig Dispense Refill   acetaminophen  (TYLENOL ) 325 MG tablet Take 2 tablets (650 mg total) by mouth every 6 (six) hours as needed for mild pain.     amLODipine  (NORVASC ) 2.5 MG tablet Take 2.5 mg by mouth in the morning.     aspirin  EC 81 MG tablet Take 81 mg by mouth in the morning.     Bacillus Coagulans-Inulin (BENEFIBER PREBIOTIC+PROBIOTIC) CHEW Chew 3 tablets by mouth in the morning and at bedtime.     diazepam  (VALIUM ) 5 MG tablet Take 5 mg by mouth in the morning and at bedtime.     diphenhydrAMINE  (BENADRYL ) 25 mg capsule Take 25 mg by mouth 2 (two) times daily as needed for itching.     EPINEPHrine  (EPIPEN  2-PAK) 0.3 mg/0.3 mL IJ SOAJ injection Inject 0.3 mLs (0.3 mg total) into the muscle once as needed for up to 1 dose (for severe allergic reaction). CAll 911 immediately  if you have to use this medicine 1 Device 1   estradiol  (CLIMARA  - DOSED IN MG/24 HR) 0.05 mg/24hr patch Place 0.05 mg onto the skin every Sunday.     gabapentin  (NEURONTIN ) 300 MG capsule Take 300 mg by mouth at bedtime.     HYDROcodone -acetaminophen  (NORCO) 7.5-325 MG tablet Take 1 tablet by mouth 3 (three) times daily as needed (pain.).     hydrOXYzine  (ATARAX ) 50 MG tablet Take 50 mg by mouth every 8 (eight) hours as needed for anxiety.     ketotifen   (ALLERGY EYE DROPS) 0.035 % ophthalmic solution Place 1 drop into both eyes in the morning and at bedtime.     losartan  (COZAAR ) 100 MG tablet Take 100 mg by mouth in the morning.     mirtazapine  (REMERON ) 15 MG tablet Take 15 mg by mouth at bedtime.     Multiple Vitamins-Minerals (ADULT GUMMY PO) Take 3 each by mouth in the morning.     ondansetron  (ZOFRAN ) 4 MG tablet Take 1 tablet (4 mg total) by mouth every 8 (eight) hours as needed for nausea or vomiting. 20 tablet 0   pantoprazole  (PROTONIX ) 40 MG tablet TAKE 1 TABLET BY MOUTH TWICE A DAY 180 tablet 1   RESTASIS  0.05 % ophthalmic emulsion Place 1 drop into both eyes 2 (two) times daily.     rosuvastatin  (CRESTOR ) 5 MG tablet Take 5 mg by mouth in the morning.     sucralfate  (CARAFATE ) 1 GM/10ML suspension Take 10 mLs (1 g total) by mouth 4 (four) times daily. 420 mL 1   XOLAIR 150 MG/ML prefilled syringe Inject 150 mg into the skin every 14 (fourteen) days.     No current facility-administered medications for this visit.    SURGICAL HISTORY:  Past Surgical History:  Procedure Laterality Date   BRONCHIAL BRUSHINGS  08/10/2022   Procedure: BRONCHIAL BRUSHINGS;  Surgeon: Brenna Adine CROME, DO;  Location: [facility];  Service: Pulmonary;;   BRONCHIAL NEEDLE ASPIRATION BIOPSY  08/10/2022   Procedure: BRONCHIAL NEEDLE ASPIRATION BIOPSIES;  Surgeon: Brenna Adine CROME, DO;  Location: [facility];  Service: Pulmonary;;   CESAREAN SECTION     times 5   FIDUCIAL MARKER PLACEMENT  08/10/2022   Procedure: FIDUCIAL dye marker;  Surgeon: Brenna Adine CROME, DO;  Location: [facility];  Service: Pulmonary;;   INTERCOSTAL NERVE BLOCK Right 08/10/2022   Procedure: INTERCOSTAL NERVE BLOCK;  Surgeon: Shyrl Linnie KIDD, MD;  Location: [facility] OR;  Service: Thoracic;  Laterality: Right;   IR RADIOLOGIST EVAL & MGMT  01/18/2022   LYMPH NODE DISSECTION Right 08/10/2022   Procedure: LYMPH NODE DISSECTION;  Surgeon: Shyrl Linnie KIDD, MD;  Location: [facility] OR;   Service: Thoracic;  Laterality: Right;   THYROID SURGERY     TUBAL LIGATION     tubal reversal Bilateral     REVIEW OF SYSTEMS:  A comprehensive review of systems was negative except for: Respiratory: positive for dyspnea on exertion   PHYSICAL EXAMINATION: General appearance: alert, cooperative, fatigued, and no distress Head: Normocephalic, without obvious abnormality, atraumatic Neck: no adenopathy, no JVD, supple, symmetrical, trachea midline, and thyroid not enlarged, symmetric, no tenderness/mass/nodules Lymph nodes: Cervical, supraclavicular, and axillary nodes normal. Resp: clear to auscultation bilaterally Back: symmetric, no curvature. ROM normal. No CVA tenderness. Cardio: regular rate and rhythm, S1, S2 normal, no murmur, click, rub or gallop GI: soft, non-tender; bowel sounds normal; no masses,  no organomegaly Extremities: extremities normal, atraumatic, no cyanosis or edema  ECOG PERFORMANCE STATUS:  1 - Symptomatic but completely ambulatory  Blood pressure 135/71, pulse 85, resp. rate 16, height 5' 5.5 (1.664 m), weight 186 lb (84.4 kg), last menstrual period 02/13/2020, SpO2 100%.  LABORATORY DATA: Lab Results  Component Value Date   WBC 5.8 09/12/2023   HGB 13.8 09/12/2023   HCT 42.5 09/12/2023   MCV 83.5 09/12/2023   PLT 225 09/12/2023      Chemistry      Component Value Date/Time   NA 142 09/12/2023 0810   K 3.6 09/12/2023 0810   CL 106 09/12/2023 0810   CO2 31 09/12/2023 0810   BUN 13 09/12/2023 0810   CREATININE 0.81 09/12/2023 0810      Component Value Date/Time   CALCIUM  8.8 (L) 09/12/2023 0810   ALKPHOS 91 09/12/2023 0810   AST 14 (L) 09/12/2023 0810   ALT 14 09/12/2023 0810   BILITOT 0.3 09/12/2023 0810       RADIOGRAPHIC STUDIES: No results found.  ASSESSMENT AND PLAN: This is a very pleasant 48 years old African-American female with stage Ib (T2a, N0, M0) non-small cell lung cancer, adenocarcinoma presented with cavitary right lower  lobe lung nodule status post right lower lobectomy with lymph node sampling under the care of Dr. Shyrl on August 10, 2022 and there was visceral pleural involvement and the tumor was 1.1 cm in size.  The patient is currently on observation and she is feeling fine except for occasional shortness of breath with exertion. She had repeat CT scan of the chest performed recently.  Unfortunately the final report is still pending but I personally independently reviewed the scan images and I do not see any clear evidence for disease recurrence or metastasis but I will wait for the final report for confirmation.    Non-Small Cell Lung Cancer (NSCLC), Adenocarcinoma, Stage 1B Diagnosed December 2023. Underwent right lower lobectomy. Currently under observation with regular imaging. Reports increased dyspnea and fatigue over the last six months. Weight well-maintained. Oxygen saturation at 100%. Recent scan showed no concerning findings; awaiting formal radiologist report. Discussed recurrence risk and importance of regular monitoring due to family history. - Await formal radiologist report on recent scan - Schedule follow-up appointment in six months - Perform scan and lab tests one week before the next appointment - Contact patient if any concerning findings are noted in the radiologist report  Family History of Cancer Significant family history of cancer, including lung cancer in multiple relatives (uncle, aunt, grandmother, grandfather). No immediate family members affected. Discussed genetic predisposition and importance of vigilant monitoring. - Continue close monitoring due to family history  General Health Maintenance Engages in regular physical activity and maintains weight. Reports increased dyspnea and fatigue, likely related to cancer history and treatment. - Encourage continued physical activity and healthy lifestyle.   The patient was advised to call immediately if she has any concerning  symptoms in the interval. The patient voices understanding of current disease status and treatment options and is in agreement with the current care plan.  All questions were answered. The patient knows to call the clinic with any problems, questions or concerns. We can certainly see the patient much sooner if necessary.  The total time spent in the appointment was 20 minutes.  Disclaimer: This note was dictated with voice recognition software. Similar sounding words can inadvertently be transcribed and may not be corrected upon review.

## 2023-09-19 ENCOUNTER — Encounter: Payer: Self-pay | Admitting: Internal Medicine

## 2023-12-10 ENCOUNTER — Emergency Department (HOSPITAL_COMMUNITY)
Admission: EM | Admit: 2023-12-10 | Discharge: 2023-12-10 | Disposition: A | Discharge status: Home / Self Care | Attending: Emergency Medicine | Admitting: Emergency Medicine

## 2023-12-10 ENCOUNTER — Other Ambulatory Visit: Payer: Self-pay

## 2023-12-10 ENCOUNTER — Emergency Department (HOSPITAL_COMMUNITY)

## 2023-12-10 ENCOUNTER — Encounter (HOSPITAL_COMMUNITY): Payer: Self-pay

## 2023-12-10 DIAGNOSIS — R112 Nausea with vomiting, unspecified: Secondary | ICD-10-CM | POA: Diagnosis not present

## 2023-12-10 DIAGNOSIS — M25532 Pain in left wrist: Secondary | ICD-10-CM | POA: Insufficient documentation

## 2023-12-10 DIAGNOSIS — Z7982 Long term (current) use of aspirin: Secondary | ICD-10-CM | POA: Insufficient documentation

## 2023-12-10 DIAGNOSIS — E876 Hypokalemia: Secondary | ICD-10-CM | POA: Diagnosis not present

## 2023-12-10 DIAGNOSIS — D72829 Elevated white blood cell count, unspecified: Secondary | ICD-10-CM | POA: Insufficient documentation

## 2023-12-10 LAB — BASIC METABOLIC PANEL WITH GFR
Anion gap: 9 (ref 5–15)
BUN: 9 mg/dL (ref 6–20)
CO2: 27 mmol/L (ref 22–32)
Calcium: 8.5 mg/dL — ABNORMAL LOW (ref 8.9–10.3)
Chloride: 101 mmol/L (ref 98–111)
Creatinine, Ser: 0.8 mg/dL (ref 0.44–1.00)
GFR, Estimated: >60 mL/min (ref >60)
Glucose, Bld: 112 mg/dL — ABNORMAL HIGH (ref 70–99)
Potassium: 3.1 mmol/L — ABNORMAL LOW (ref 3.5–5.1)
Sodium: 137 mmol/L (ref 135–145)

## 2023-12-10 LAB — CBC
HCT: 44.1 % (ref 36.0–46.0)
Hemoglobin: 14.3 g/dL (ref 12.0–15.0)
MCH: 27.3 pg (ref 26.0–34.0)
MCHC: 32.4 g/dL (ref 30.0–36.0)
MCV: 84.2 fL (ref 80.0–100.0)
Platelets: 243 10*3/uL (ref 150–400)
RBC: 5.24 MIL/uL — ABNORMAL HIGH (ref 3.87–5.11)
RDW: 13.6 % (ref 11.5–15.5)
WBC: 13.7 10*3/uL — ABNORMAL HIGH (ref 4.0–10.5)
nRBC: 0 % (ref 0.0–0.2)

## 2023-12-10 LAB — TROPONIN I (HIGH SENSITIVITY): Troponin I (High Sensitivity): 4 ng/L (ref <18)

## 2023-12-10 MED ORDER — ONDANSETRON 4 MG PO TBDP: ORAL_TABLET | ORAL | 0 refills | Status: AC

## 2023-12-10 MED ORDER — ONDANSETRON 4 MG PO TBDP
4.0000 mg | ORAL_TABLET | Freq: Once | ORAL | Status: AC
  Administered 2023-12-10: 4 mg via ORAL
  Filled 2023-12-10: qty 1

## 2023-12-10 MED ORDER — KETOROLAC TROMETHAMINE 15 MG/ML IJ SOLN
15.0000 mg | Freq: Once | INTRAMUSCULAR | Status: AC
  Administered 2023-12-10: 15 mg via INTRAMUSCULAR
  Filled 2023-12-10: qty 1

## 2023-12-10 NOTE — ED Provider Notes (Signed)
 [place] EMERGENCY DEPARTMENT AT [facility] Provider Note   CSN: [id number] Arrival date & time: 12/10/23  4098     History  Chief Complaint  Patient presents with   Extremity Weakness    Crystal Mora is a 48 y.o. female.  48 yo F with a chief complaint of left wrist pain.  Patient was out with her family yesterday and when she got home she unfortunately became unwell nausea vomiting throughout the evening.  When she woke up she knows that her wrists hurt bilaterally.  The right wrist has gotten better but the left wrist is still bothering her quite a bit decided come into the ED for evaluation.   Extremity Weakness       Home Medications Prior to Admission medications   Medication Sig Start Date End Date Taking? Authorizing Provider  ondansetron  (ZOFRAN -ODT) 4 MG disintegrating tablet 4mg  ODT q4 hours prn nausea/vomit 12/10/23  Yes Julez Huseby, DO  acetaminophen  (TYLENOL ) 325 MG tablet Take 2 tablets (650 mg total) by mouth every 6 (six) hours as needed for mild pain. 08/13/22   Barrett, Erin R, PA-C  amLODipine  (NORVASC ) 2.5 MG tablet Take 2.5 mg by mouth in the morning.    [provider]  aspirin  EC 81 MG tablet Take 81 mg by mouth in the morning.    [provider]  Bacillus Coagulans-Inulin (BENEFIBER PREBIOTIC+PROBIOTIC) CHEW Chew 3 tablets by mouth in the morning and at bedtime.    [provider]  diazepam  (VALIUM ) 5 MG tablet Take 5 mg by mouth in the morning and at bedtime.    [provider]  diphenhydrAMINE  (BENADRYL ) 25 mg capsule Take 25 mg by mouth 2 (two) times daily as needed for itching.    [provider]  EPINEPHrine  (EPIPEN  2-PAK) 0.3 mg/0.3 mL IJ SOAJ injection Inject 0.3 mLs (0.3 mg total) into the muscle once as needed for up to 1 dose (for severe allergic reaction). CAll 911 immediately if you have to use this medicine 09/24/18   Coretha Dew, PA-C  estradiol  (CLIMARA  - DOSED IN MG/24 HR) 0.05  mg/24hr patch Place 0.05 mg onto the skin every Sunday.    [provider]  gabapentin  (NEURONTIN ) 300 MG capsule Take 300 mg by mouth at bedtime. 08/14/22   [provider]  HYDROcodone -acetaminophen  (NORCO) 7.5-325 MG tablet Take 1 tablet by mouth 3 (three) times daily as needed (pain.). 04/19/22   [provider]  hydrOXYzine  (ATARAX ) 50 MG tablet Take 50 mg by mouth every 8 (eight) hours as needed for anxiety.    [provider]  ketotifen  (ALLERGY EYE DROPS) 0.035 % ophthalmic solution Place 1 drop into both eyes in the morning and at bedtime.    [provider]  losartan  (COZAAR ) 100 MG tablet Take 100 mg by mouth in the morning.    [provider]  mirtazapine  (REMERON ) 15 MG tablet Take 15 mg by mouth at bedtime. 08/14/22   [provider]  Multiple Vitamins-Minerals (ADULT GUMMY PO) Take 3 each by mouth in the morning.    [provider]  ondansetron  (ZOFRAN ) 4 MG tablet Take 1 tablet (4 mg total) by mouth every 8 (eight) hours as needed for nausea or vomiting. 08/14/22   Barrett, Erin R, PA-C  pantoprazole  (PROTONIX ) 40 MG tablet TAKE 1 TABLET BY MOUTH TWICE A DAY 02/08/23   Armbruster, Lendon Queen, MD  RESTASIS  0.05 % ophthalmic emulsion Place 1 drop into both eyes 2 (two)  times daily. 12/20/21   [provider]  rosuvastatin  (CRESTOR ) 5 MG tablet Take 5 mg by mouth in the morning. 10/13/21   [provider]  sucralfate  (CARAFATE ) 1 GM/10ML suspension Take 10 mLs (1 g total) by mouth 4 (four) times daily. 07/18/22   Armbruster, Lendon Queen, MD  XOLAIR 150 MG/ML prefilled syringe Inject 150 mg into the skin every 14 (fourteen) days. 07/31/22   [provider]      Allergies    Biaxin [clarithromycin], Morphine  and codeine, Percocet [oxycodone -acetaminophen ], Shrimp [shellfish allergy], Celexa [citalopram], and Topamax [topiramate]    Review of Systems   Review of Systems  Musculoskeletal:  Positive for  extremity weakness.    Physical Exam Updated Vital Signs BP 107/70 (BP Location: Right Arm)   Pulse 78   Temp 98 F (36.7 C) (Oral)   Resp 16   Ht 5\' 5"  (1.651 m)   Wt 81.6 kg   LMP 02/13/2020 (Approximate)   SpO2 99%   BMI 29.95 kg/m  Physical Exam Vitals and nursing note reviewed.  Constitutional:      General: She is not in acute distress.    Appearance: She is well-developed. She is not diaphoretic.  HENT:     Head: Normocephalic and atraumatic.  Eyes:     Pupils: Pupils are equal, round, and reactive to light.  Cardiovascular:     Rate and Rhythm: Normal rate and regular rhythm.     Heart sounds: No murmur heard.    No friction rub. No gallop.  Pulmonary:     Effort: Pulmonary effort is normal.     Breath sounds: No wheezing or rales.  Abdominal:     General: There is no distension.     Palpations: Abdomen is soft.     Tenderness: There is no abdominal tenderness.  Musculoskeletal:        General: Tenderness present.     Cervical back: Normal range of motion and neck supple.     Comments: Pain to the left wrist mostly about the ulnar aspect.  No obvious edema to the wrist.  Pulse motor and sensation intact.  No pain about the elbow.  She does have some pain about the left trapezius muscle belly.  No obvious midline spinal tenderness step-offs or deformities.  Skin:    General: Skin is warm and dry.  Neurological:     Mental Status: She is alert and oriented to person, place, and time.  Psychiatric:        Behavior: Behavior normal.     ED Results / Procedures / Treatments   Labs (all labs ordered are listed, but only abnormal results are displayed) Labs Reviewed  BASIC METABOLIC PANEL WITH GFR - Abnormal; Notable for the following components:      Result Value   Potassium 3.1 (*)    Glucose, Bld 112 (*)    Calcium  8.5 (*)    All other components within normal limits  CBC - Abnormal; Notable for the following components:   WBC 13.7 (*)    RBC 5.24 (*)     All other components within normal limits  TROPONIN I (HIGH SENSITIVITY)    EKG EKG Interpretation Date/Time:  Tuesday December 10 2023 10:15:27 EDT Ventricular Rate:  93 PR Interval:  148 QRS Duration:  83 QT Interval:  376 QTC Calculation: 468 R Axis:   41  Text Interpretation: Sinus rhythm Borderline T wave abnormalities Baseline wander in lead(s) I III aVL Baseline wander TECHNICALLY DIFFICULT  Otherwise no significant change Confirmed by Albertus Hughs [phone]) on 12/10/2023 11:44:30 AM  Radiology DG Wrist Complete Left Result Date: 12/10/2023 CLINICAL DATA:  wrist pain , left arm pain, numbness EXAM: LEFT WRIST - COMPLETE 3+ VIEW COMPARISON:  March 14, 2023 FINDINGS: No acute fracture or dislocation. There is no evidence of arthropathy or other focal bone abnormality. Soft tissues are unremarkable. IMPRESSION: No acute fracture or dislocation. Electronically Signed   By: Rance Burrows M.D.   On: 12/10/2023 13:44   DG Chest 2 View Result Date: 12/10/2023 CLINICAL DATA:  Chest pain. EXAM: CHEST - 2 VIEW COMPARISON:  January 29, 2023. FINDINGS: The heart size and mediastinal contours are within normal limits. Stable elevated right hemidiaphragm. Both lungs are clear. The visualized skeletal structures are unremarkable. IMPRESSION: No active cardiopulmonary disease. Electronically Signed   By: Rosalene Colon M.D.   On: 12/10/2023 11:16    Procedures Procedures    Medications Ordered in ED Medications  ondansetron  (ZOFRAN -ODT) disintegrating tablet 4 mg (4 mg Oral Given 12/10/23 1156)  ketorolac  (TORADOL ) 15 MG/ML injection 15 mg (15 mg Intramuscular Given 12/10/23 1156)    ED Course/ Medical Decision Making/ A&P                                 Medical Decision Making Amount and/or Complexity of Data Reviewed Labs: ordered. Radiology: ordered.  Risk Prescription drug management.   48 yo F with a chief complaints of left wrist pain after an evening where she was throwing up  reportedly all night.  Suspect likely she strained her wrist trying to forcefully vomit.  I think unlikely the patient has septic arthritis to the left wrist.  No obvious edema no erythema.  Will obtain a plain film.  Through the triage process patient had lab work performed.  Mild leukocytosis likely secondary to vomiting, mild hypokalemia.  Troponin negative.  Plain film of the left wrist independently interpreted by me without fracture.  1:59 PM:  I have discussed the diagnosis/risks/treatment options with the patient.  Evaluation and diagnostic testing in the emergency department does not suggest an emergent condition requiring admission or immediate intervention beyond what has been performed at this time.  They will follow up with PCP. We also discussed returning to the ED immediately if new or worsening sx occur. We discussed the sx which are most concerning (e.g., sudden worsening pain, fever, inability to tolerate by mouth) that necessitate immediate return. Medications administered to the patient during their visit and any new prescriptions provided to the patient are listed below.  Medications given during this visit Medications  ondansetron  (ZOFRAN -ODT) disintegrating tablet 4 mg (4 mg Oral Given 12/10/23 1156)  ketorolac  (TORADOL ) 15 MG/ML injection 15 mg (15 mg Intramuscular Given 12/10/23 1156)     The patient appears reasonably screen and/or stabilized for discharge and I doubt any other medical condition or other [facility] requiring further screening, evaluation, or treatment in the ED at this time prior to discharge.          Final Clinical Impression(s) / ED Diagnoses Final diagnoses:  Wrist pain, acute, left  Nausea and vomiting in adult    Rx / DC Orders ED Discharge Orders          Ordered    ondansetron  (ZOFRAN -ODT) 4 MG disintegrating tablet        12/10/23 1358  Albertus Hughs, DO 12/10/23 1359

## 2023-12-10 NOTE — ED Triage Notes (Signed)
 Patient presented to ER with left arm pain, numbness to left arm and nausea. Patient stated this started last night. Patient denies taking blood thinners.

## 2023-12-10 NOTE — Progress Notes (Signed)
 Orthopedic Tech Progress Note Patient Details:  Crystal Mora [birth year] [id number]  Ortho Devices Type of Ortho Device: Velcro wrist splint Ortho Device/Splint Location: left Ortho Device/Splint Interventions: Ordered, Application, Adjustment   Post Interventions Patient Tolerated: Well Instructions Provided: Adjustment of device, Care of device  [name] 12/10/2023, 2:16 PM

## 2023-12-10 NOTE — Discharge Instructions (Signed)
 The x-ray of your wrist looks okay.  Please follow-up with your family doctor in the office.  I have given you a brace to help you with discomfort.  Please return for redness fever uncontrolled pain.  Take 4 over the counter ibuprofen  tablets 3 times a day or 2 over-the-counter naproxen tablets twice a day for pain. Also take tylenol  1000mg (2 extra strength) four times a day.

## 2024-03-09 ENCOUNTER — Ambulatory Visit (HOSPITAL_COMMUNITY)
Admission: RE | Admit: 2024-03-09 | Discharge: 2024-03-09 | Disposition: A | Discharge status: Home / Self Care | Source: Ambulatory Visit | Attending: Internal Medicine | Admitting: Internal Medicine

## 2024-03-09 ENCOUNTER — Inpatient Hospital Stay: Payer: 59 | Attending: Internal Medicine

## 2024-03-09 DIAGNOSIS — Z902 Acquired absence of lung [part of]: Secondary | ICD-10-CM | POA: Diagnosis not present

## 2024-03-09 DIAGNOSIS — Z85118 Personal history of other malignant neoplasm of bronchus and lung: Secondary | ICD-10-CM | POA: Insufficient documentation

## 2024-03-09 DIAGNOSIS — C3490 Malignant neoplasm of unspecified part of unspecified bronchus or lung: Secondary | ICD-10-CM | POA: Insufficient documentation

## 2024-03-09 LAB — CMP (CANCER CENTER ONLY)
ALT: 7 U/L (ref 0–44)
AST: 9 U/L — ABNORMAL LOW (ref 15–41)
Albumin: 3.7 g/dL (ref 3.5–5.0)
Alkaline Phosphatase: 84 U/L (ref 38–126)
Anion gap: 5 (ref 5–15)
BUN: 9 mg/dL (ref 6–20)
CO2: 30 mmol/L (ref 22–32)
Calcium: 8.9 mg/dL (ref 8.9–10.3)
Chloride: 106 mmol/L (ref 98–111)
Creatinine: 0.83 mg/dL (ref 0.44–1.00)
GFR, Estimated: >60 mL/min (ref >60)
Glucose, Bld: 109 mg/dL — ABNORMAL HIGH (ref 70–99)
Potassium: 3.4 mmol/L — ABNORMAL LOW (ref 3.5–5.1)
Sodium: 141 mmol/L (ref 135–145)
Total Bilirubin: 0.3 mg/dL (ref 0.0–1.2)
Total Protein: 6.9 g/dL (ref 6.5–8.1)

## 2024-03-09 LAB — CBC WITH DIFFERENTIAL (CANCER CENTER ONLY)
Abs Immature Granulocytes: 0.01 K/uL (ref 0.00–0.07)
Basophils Absolute: 0 K/uL (ref 0.0–0.1)
Basophils Relative: 0 %
Eosinophils Absolute: 0.1 K/uL (ref 0.0–0.5)
Eosinophils Relative: 2 %
HCT: 40.2 % (ref 36.0–46.0)
Hemoglobin: 13.4 g/dL (ref 12.0–15.0)
Immature Granulocytes: 0 %
Lymphocytes Relative: 25 %
Lymphs Abs: 1.5 K/uL (ref 0.7–4.0)
MCH: 27.5 pg (ref 26.0–34.0)
MCHC: 33.3 g/dL (ref 30.0–36.0)
MCV: 82.5 fL (ref 80.0–100.0)
Monocytes Absolute: 0.6 K/uL (ref 0.1–1.0)
Monocytes Relative: 9 %
Neutro Abs: 4 K/uL (ref 1.7–7.7)
Neutrophils Relative %: 64 %
Platelet Count: 229 K/uL (ref 150–400)
RBC: 4.87 MIL/uL (ref 3.87–5.11)
RDW: 14 % (ref 11.5–15.5)
WBC Count: 6.2 K/uL (ref 4.0–10.5)
nRBC: 0 % (ref 0.0–0.2)

## 2024-03-09 MED ORDER — IOHEXOL 300 MG/ML  SOLN
75.0000 mL | Freq: Once | INTRAMUSCULAR | Status: AC | PRN
  Administered 2024-03-09: 75 mL via INTRAVENOUS

## 2024-03-16 ENCOUNTER — Inpatient Hospital Stay: Payer: 59 | Attending: Internal Medicine | Admitting: Internal Medicine

## 2024-03-16 VITALS — BP 119/71 | HR 85 | Temp 98.0°F | Resp 18 | Ht 65.0 in | Wt 179.0 lb

## 2024-03-16 DIAGNOSIS — R062 Wheezing: Secondary | ICD-10-CM | POA: Diagnosis not present

## 2024-03-16 DIAGNOSIS — Z85118 Personal history of other malignant neoplasm of bronchus and lung: Secondary | ICD-10-CM | POA: Diagnosis present

## 2024-03-16 DIAGNOSIS — Z902 Acquired absence of lung [part of]: Secondary | ICD-10-CM | POA: Diagnosis not present

## 2024-03-16 DIAGNOSIS — C3490 Malignant neoplasm of unspecified part of unspecified bronchus or lung: Secondary | ICD-10-CM | POA: Diagnosis not present

## 2024-03-16 DIAGNOSIS — Z08 Encounter for follow-up examination after completed treatment for malignant neoplasm: Secondary | ICD-10-CM | POA: Insufficient documentation

## 2024-03-16 NOTE — Progress Notes (Signed)
 [facility] [phone]   [phone]  OFFICE PROGRESS NOTE  Crystal Camie HERO., Crystal Mora [address] [place] [address]  DIAGNOSIS: Stage Ib (T2a, N0, M0) non-small cell lung cancer, adenocarcinoma presented with cavitary right lower lobe lung nodule   PRIOR THERAPY: status post right lower lobectomy with lymph node sampling under the care of Dr. Shyrl on August 10, 2022 and there was visceral pleural involvement and the tumor was 1.1 cm in size.   CURRENT THERAPY: Observation  INTERVAL HISTORY: Crystal Mora 48 y.o. female returns to the clinic today for 44-month follow-up visit.Discussed the use of AI scribe software for clinical note transcription with the patient, who gave verbal consent to proceed.  History of Present Illness Crystal Mora is a 48 year old female with stage 1 small cell lung cancer, adenocarcinoma, who presents for evaluation with repeat CT scan for restaging of her disease.  She was diagnosed with stage 1 small cell lung cancer, adenocarcinoma, in December 2023 and underwent a right lower lobectomy with lymph node sampling. She is currently in an observation period.  She experiences increased shortness of breath and wheezing at night. No diagnosis of COPD and she is not using inhalers.     Past Medical History:  Diagnosis Date   Anxiety    Bipolar 1 disorder (HCC)    Chronic urticaria    Depression    Diabetes mellitus without complication (HCC)    Pt states prediabetic   Hypertension    states she has not took BP medication for 2 years   Insomnia    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   Thyroid disease     ALLERGIES:  is allergic to biaxin [clarithromycin], morphine  and codeine, percocet [oxycodone -acetaminophen ], shrimp [shellfish allergy], celexa [citalopram], and topamax [topiramate].  MEDICATIONS:  Current Outpatient Medications  Medication Sig Dispense Refill   acetaminophen   (TYLENOL ) 325 MG tablet Take 2 tablets (650 mg total) by mouth every 6 (six) hours as needed for mild pain.     amLODipine  (NORVASC ) 2.5 MG tablet Take 2.5 mg by mouth in the morning.     aspirin  EC 81 MG tablet Take 81 mg by mouth in the morning.     Bacillus Coagulans-Inulin (BENEFIBER PREBIOTIC+PROBIOTIC) CHEW Chew 3 tablets by mouth in the morning and at bedtime.     diazepam  (VALIUM ) 5 MG tablet Take 5 mg by mouth in the morning and at bedtime.     diphenhydrAMINE  (BENADRYL ) 25 mg capsule Take 25 mg by mouth 2 (two) times daily as needed for itching.     EPINEPHrine  (EPIPEN  2-PAK) 0.3 mg/0.3 mL IJ SOAJ injection Inject 0.3 mLs (0.3 mg total) into the muscle once as needed for up to 1 dose (for severe allergic reaction). CAll 911 immediately if you have to use this medicine 1 Device 1   estradiol  (CLIMARA  - DOSED IN MG/24 HR) 0.05 mg/24hr patch Place 0.05 mg onto the skin every Sunday.     gabapentin  (NEURONTIN ) 300 MG capsule Take 300 mg by mouth at bedtime.     HYDROcodone -acetaminophen  (NORCO) 7.5-325 MG tablet Take 1 tablet by mouth 3 (three) times daily as needed (pain.).     hydrOXYzine  (ATARAX ) 50 MG tablet Take 50 mg by mouth every 8 (eight) hours as needed for anxiety.     ketotifen  (ALLERGY EYE DROPS) 0.035 % ophthalmic solution Place 1 drop into both eyes in the morning and at  bedtime.     losartan  (COZAAR ) 100 MG tablet Take 100 mg by mouth in the morning.     mirtazapine  (REMERON ) 15 MG tablet Take 15 mg by mouth at bedtime.     Multiple Vitamins-Minerals (ADULT GUMMY PO) Take 3 each by mouth in the morning.     ondansetron  (ZOFRAN ) 4 MG tablet Take 1 tablet (4 mg total) by mouth every 8 (eight) hours as needed for nausea or vomiting. 20 tablet 0   ondansetron  (ZOFRAN -ODT) 4 MG disintegrating tablet 4mg  ODT q4 hours prn nausea/vomit 20 tablet 0   pantoprazole  (PROTONIX ) 40 MG tablet TAKE 1 TABLET BY MOUTH TWICE A DAY 180 tablet 1   RESTASIS  0.05 % ophthalmic emulsion Place 1 drop  into both eyes 2 (two) times daily.     rosuvastatin  (CRESTOR ) 5 MG tablet Take 5 mg by mouth in the morning.     sucralfate  (CARAFATE ) 1 GM/10ML suspension Take 10 mLs (1 g total) by mouth 4 (four) times daily. 420 mL 1   XOLAIR 150 MG/ML prefilled syringe Inject 150 mg into the skin every 14 (fourteen) days.     No current facility-administered medications for this visit.    SURGICAL HISTORY:  Past Surgical History:  Procedure Laterality Date   BRONCHIAL BRUSHINGS  08/10/2022   Procedure: BRONCHIAL BRUSHINGS;  Surgeon: Brenna Adine CROME, DO;  Location: [facility];  Service: Pulmonary;;   BRONCHIAL NEEDLE ASPIRATION BIOPSY  08/10/2022   Procedure: BRONCHIAL NEEDLE ASPIRATION BIOPSIES;  Surgeon: Brenna Adine CROME, DO;  Location: [facility];  Service: Pulmonary;;   CESAREAN SECTION     times 5   FIDUCIAL MARKER PLACEMENT  08/10/2022   Procedure: FIDUCIAL dye marker;  Surgeon: Brenna Adine CROME, DO;  Location: [facility];  Service: Pulmonary;;   INTERCOSTAL NERVE BLOCK Right 08/10/2022   Procedure: INTERCOSTAL NERVE BLOCK;  Surgeon: Shyrl Linnie KIDD, Crystal Mora;  Location: [facility] OR;  Service: Thoracic;  Laterality: Right;   IR RADIOLOGIST EVAL & MGMT  01/18/2022   LYMPH NODE DISSECTION Right 08/10/2022   Procedure: LYMPH NODE DISSECTION;  Surgeon: Shyrl Linnie KIDD, Crystal Mora;  Location: [facility] OR;  Service: Thoracic;  Laterality: Right;   THYROID SURGERY     TUBAL LIGATION     tubal reversal Bilateral     REVIEW OF SYSTEMS:  A comprehensive review of systems was negative except for: Respiratory: positive for wheezing   PHYSICAL EXAMINATION: General appearance: alert, cooperative, and no distress Head: Normocephalic, without obvious abnormality, atraumatic Neck: no adenopathy, no JVD, supple, symmetrical, trachea midline, and thyroid not enlarged, symmetric, no tenderness/mass/nodules Lymph nodes: Cervical, supraclavicular, and axillary nodes normal. Resp: clear to auscultation  bilaterally Back: symmetric, no curvature. ROM normal. No CVA tenderness. Cardio: regular rate and rhythm, S1, S2 normal, no murmur, click, rub or gallop GI: soft, non-tender; bowel sounds normal; no masses,  no organomegaly Extremities: extremities normal, atraumatic, no cyanosis or edema  ECOG PERFORMANCE STATUS: 1 - Symptomatic but completely ambulatory  Blood pressure 119/71, pulse 85, temperature 98 F (36.7 C), temperature source Temporal, resp. rate 18, height 5' 5 (1.651 m), weight 179 lb (81.2 kg), last menstrual period 02/13/2020, SpO2 99%.  LABORATORY DATA: Lab Results  Component Value Date   WBC 6.2 03/09/2024   HGB 13.4 03/09/2024   HCT 40.2 03/09/2024   MCV 82.5 03/09/2024   PLT 229 03/09/2024      Chemistry      Component Value Date/Time   NA 141 03/09/2024 0818   K 3.4 (L)  03/09/2024 0818   CL 106 03/09/2024 0818   CO2 30 03/09/2024 0818   BUN 9 03/09/2024 0818   CREATININE 0.83 03/09/2024 0818      Component Value Date/Time   CALCIUM  8.9 03/09/2024 0818   ALKPHOS 84 03/09/2024 0818   AST 9 (L) 03/09/2024 0818   ALT 7 03/09/2024 0818   BILITOT 0.3 03/09/2024 0818       RADIOGRAPHIC STUDIES: CT Chest W Contrast Result Date: 03/10/2024 CLINICAL DATA:  Non-small cell lung cancer, staging. History of right lower lobectomy. * Tracking Code: BO * EXAM: CT CHEST WITH CONTRAST TECHNIQUE: Multidetector CT imaging of the chest was performed during intravenous contrast administration. RADIATION DOSE REDUCTION: This exam was performed according to the departmental dose-optimization program which includes automated exposure control, adjustment of the mA and/or kV according to patient size and/or use of iterative reconstruction technique. CONTRAST:  75mL OMNIPAQUE  IOHEXOL  300 MG/ML  SOLN COMPARISON:  Multiple priors including CT September 12, 2023 FINDINGS: Cardiovascular: Normal caliber thoracic aorta. Normal size heart. No significant pericardial effusion/thickening.  Mediastinum/Nodes: Prior left hemithyroidectomy. Similar soft tissue in the anterior mediastinum which conforms underlying vasculature and has a somewhat feathery lobular appearance for instance on image 50/2 compatible with thymic hyperplasia/rebound. No pathologically enlarged mediastinal, hilar or axillary lymph nodes. Lungs/Pleura: Similar surgical changes of right lower lobectomy without new suspicious nodularity along the suture line. New 10 mm ground-glass nodule in the dependent left lower lobe on image 70/7. Stable 3 mm right upper lobe pulmonary nodule on image 40/7. Mild diffuse bronchial wall thickening with mosaic attenuation of the lungs. Upper Abdomen: No acute abnormality. Musculoskeletal: No aggressive lytic or blastic lesion of bone. IMPRESSION: 1. Similar surgical changes of right lower lobectomy without evidence of local recurrence. 2. New 10 mm ground-glass nodule in the dependent left lower lobe, nonspecific but favored to reflect an infectious or inflammatory process. Suggest follow up short term interval chest CT. 3. Mild diffuse bronchial wall thickening with mosaic attenuation of the lungs, suggestive of small airways disease. 4. Similar soft tissue in the anterior mediastinum which conforms underlying vasculature and has a somewhat feathery lobular appearance compatible with thymic hyperplasia/rebound. Electronically Signed   By: Crystal Holder M.D.   On: 03/10/2024 12:35    ASSESSMENT AND PLAN: This is a very pleasant 48 years old African-American female with stage Ib (T2a, N0, M0) non-small cell lung cancer, adenocarcinoma presented with cavitary right lower lobe lung nodule status post right lower lobectomy with lymph node sampling under the care of Dr. Shyrl on August 10, 2022 and there was visceral pleural involvement and the tumor was 1.1 cm in size.  The patient is currently on observation and she is feeling fine except for some wheezing. She had repeat CT scan of the  chest performed recently.  I personally and independently reviewed the scan and discussed the results with the patient today.  Her scan showed no concerning findings for disease recurrence or metastasis except for suspicious 10 mm groundglass nodule in the dependent left lower lobe that were nonspecific and favored to reflect an infectious or inflammatory process. Assessment and Plan Assessment & Plan Stage 1 right lower lobe lung adenocarcinoma, post-lobectomy, under surveillance Stage 1 adenocarcinoma of the right lower lobe, status post-lobectomy with lymph node sampling, currently under surveillance. No evidence of recurrence or metastasis on recent CT scan. - Continue surveillance with repeat CT scan in six months - Provide copy of current scan results to her  Pulmonary inflammation, left  lung (radiographic finding) Radiographic finding of a small area of inflammation in the left lung. Considered non-concerning by both the provider and radiologist. No signs of pneumonia or other acute pulmonary conditions. - Monitor the area of inflammation on subsequent scans  Shortness of breath and wheezing Reports increased shortness of breath and wheezing, particularly at night. No prior diagnosis of COPD and not using inhalers. She was advised to call immediately if she has any other concerning symptoms in the interval.  The patient voices understanding of current disease status and treatment options and is in agreement with the current care plan.  All questions were answered. The patient knows to call the clinic with any problems, questions or concerns. We can certainly see the patient much sooner if necessary.  The total time spent in the appointment was 20 minutes.  Disclaimer: This note was dictated with voice recognition software. Similar sounding words can inadvertently be transcribed and may not be corrected upon review.

## 2024-03-17 ENCOUNTER — Telehealth: Payer: Self-pay | Admitting: Internal Medicine

## 2024-03-17 NOTE — Telephone Encounter (Signed)
 Left the patient a voicemail with the scheduled appointment details.

## 2024-04-18 ENCOUNTER — Emergency Department (HOSPITAL_COMMUNITY)

## 2024-04-18 ENCOUNTER — Other Ambulatory Visit: Payer: Self-pay

## 2024-04-18 ENCOUNTER — Encounter (HOSPITAL_COMMUNITY): Payer: Self-pay

## 2024-04-18 ENCOUNTER — Emergency Department (HOSPITAL_COMMUNITY): Admission: EM | Admit: 2024-04-18 | Discharge: 2024-04-18 | Disposition: A | Discharge status: Home / Self Care

## 2024-04-18 DIAGNOSIS — R0982 Postnasal drip: Secondary | ICD-10-CM | POA: Insufficient documentation

## 2024-04-18 DIAGNOSIS — R0602 Shortness of breath: Secondary | ICD-10-CM | POA: Diagnosis present

## 2024-04-18 DIAGNOSIS — Z7982 Long term (current) use of aspirin: Secondary | ICD-10-CM | POA: Diagnosis not present

## 2024-04-18 DIAGNOSIS — Z85118 Personal history of other malignant neoplasm of bronchus and lung: Secondary | ICD-10-CM | POA: Diagnosis not present

## 2024-04-18 LAB — BASIC METABOLIC PANEL WITH GFR
Anion gap: 12 (ref 5–15)
BUN: 15 mg/dL (ref 6–20)
CO2: 24 mmol/L (ref 22–32)
Calcium: 9.5 mg/dL (ref 8.9–10.3)
Chloride: 107 mmol/L (ref 98–111)
Creatinine, Ser: 0.81 mg/dL (ref 0.44–1.00)
GFR, Estimated: >60 mL/min (ref >60)
Glucose, Bld: 78 mg/dL (ref 70–99)
Potassium: 3 mmol/L — ABNORMAL LOW (ref 3.5–5.1)
Sodium: 144 mmol/L (ref 135–145)

## 2024-04-18 LAB — PRO BRAIN NATRIURETIC PEPTIDE: Pro Brain Natriuretic Peptide: <50 pg/mL (ref <300.0)

## 2024-04-18 LAB — CBC WITH DIFFERENTIAL/PLATELET
Abs Immature Granulocytes: 0.03 K/uL (ref 0.00–0.07)
Basophils Absolute: 0 K/uL (ref 0.0–0.1)
Basophils Relative: 0 %
Eosinophils Absolute: 0.1 K/uL (ref 0.0–0.5)
Eosinophils Relative: 1 %
HCT: 44.8 % (ref 36.0–46.0)
Hemoglobin: 13.6 g/dL (ref 12.0–15.0)
Immature Granulocytes: 0 %
Lymphocytes Relative: 20 %
Lymphs Abs: 1.9 K/uL (ref 0.7–4.0)
MCH: 26 pg (ref 26.0–34.0)
MCHC: 30.4 g/dL (ref 30.0–36.0)
MCV: 85.7 fL (ref 80.0–100.0)
Monocytes Absolute: 0.5 K/uL (ref 0.1–1.0)
Monocytes Relative: 6 %
Neutro Abs: 6.8 K/uL (ref 1.7–7.7)
Neutrophils Relative %: 73 %
Platelets: 257 K/uL (ref 150–400)
RBC: 5.23 MIL/uL — ABNORMAL HIGH (ref 3.87–5.11)
RDW: 14.1 % (ref 11.5–15.5)
WBC: 9.3 K/uL (ref 4.0–10.5)
nRBC: 0 % (ref 0.0–0.2)

## 2024-04-18 LAB — RESP PANEL BY RT-PCR (RSV, FLU A&B, COVID)  RVPGX2
Influenza A by PCR: NEGATIVE
Influenza B by PCR: NEGATIVE
Resp Syncytial Virus by PCR: NEGATIVE
SARS Coronavirus 2 by RT PCR: NEGATIVE

## 2024-04-18 LAB — TROPONIN T, HIGH SENSITIVITY: Troponin T High Sensitivity: <15 ng/L (ref 0–19)

## 2024-04-18 MED ORDER — PREDNISONE 20 MG PO TABS
40.0000 mg | ORAL_TABLET | Freq: Once | ORAL | Status: AC
  Administered 2024-04-18: 40 mg via ORAL
  Filled 2024-04-18: qty 2

## 2024-04-18 MED ORDER — PREDNISONE 10 MG PO TABS: 40.0000 mg | ORAL_TABLET | Freq: Every day | ORAL | 0 refills | Status: AC

## 2024-04-18 MED ORDER — POTASSIUM CHLORIDE CRYS ER 20 MEQ PO TBCR
40.0000 meq | EXTENDED_RELEASE_TABLET | Freq: Once | ORAL | Status: AC
  Administered 2024-04-18: 40 meq via ORAL
  Filled 2024-04-18: qty 2

## 2024-04-18 MED ORDER — CETIRIZINE HCL 10 MG PO TABS: 10.0000 mg | ORAL_TABLET | Freq: Every day | ORAL | 0 refills | Status: AC

## 2024-04-18 MED ORDER — KETOROLAC TROMETHAMINE 15 MG/ML IJ SOLN
15.0000 mg | Freq: Once | INTRAMUSCULAR | Status: AC
  Administered 2024-04-18: 15 mg via INTRAVENOUS
  Filled 2024-04-18: qty 1

## 2024-04-18 MED ORDER — IPRATROPIUM-ALBUTEROL 0.5-2.5 (3) MG/3ML IN SOLN
3.0000 mL | Freq: Once | RESPIRATORY_TRACT | Status: AC
  Administered 2024-04-18: 3 mL via RESPIRATORY_TRACT
  Filled 2024-04-18: qty 3

## 2024-04-18 NOTE — Discharge Instructions (Signed)
 Continue your inhaler as needed.  Take your steroids as prescribed.  Take your Zyrtec  every day.  Use your Flonase twice a day.  Follow-up with your primary care doctor and with your pulmonologist.  Call the offices Monday morning to make an appointment.

## 2024-04-18 NOTE — ED Provider Notes (Signed)
  EMERGENCY DEPARTMENT AT [facility] Provider Note   CSN: [id number] Arrival date & time: 04/18/24  1702     Patient presents with: Shortness of Breath   Crystal Mora is a 48 y.o. female.   48 year old female presents for evaluation of shortness of breath and cough.  States having for 5 weeks.  Has a history of lung cancer and had a recent lobectomy.  States that they recently found a new nodule in one of her lungs.  States she has been on steroids antibiotics and inhalers at home without any improvement in her symptoms.  States the cough is productive, not bloody but yellow.  States she is also had congestion in her head as well as some sinus pain and headache.  Denies any other symptoms or concerns.   Shortness of Breath Associated symptoms: cough and headaches   Associated symptoms: no abdominal pain, no chest pain, no ear pain, no fever, no rash, no sore throat and no vomiting        Prior to Admission medications   Medication Sig Start Date End Date Taking? Authorizing Provider  cetirizine  (ZYRTEC  ALLERGY) 10 MG tablet Take 1 tablet (10 mg total) by mouth daily for 14 days. 04/18/24 05/02/24 Yes Jasiya Markie L, DO  predniSONE  (DELTASONE ) 10 MG tablet Take 4 tablets (40 mg total) by mouth daily for 4 days. 04/18/24 04/22/24 Yes Hernan Turnage L, DO  acetaminophen  (TYLENOL ) 325 MG tablet Take 2 tablets (650 mg total) by mouth every 6 (six) hours as needed for mild pain. 08/13/22   Barrett, Erin R, PA-C  amLODipine  (NORVASC ) 2.5 MG tablet Take 2.5 mg by mouth in the morning.    [provider]  aspirin  EC 81 MG tablet Take 81 mg by mouth in the morning.    [provider]  Bacillus Coagulans-Inulin (BENEFIBER PREBIOTIC+PROBIOTIC) CHEW Chew 3 tablets by mouth in the morning and at bedtime.    [provider]  diazepam  (VALIUM ) 5 MG tablet Take 5 mg by mouth in the morning and at bedtime.    [provider]  diphenhydrAMINE   (BENADRYL ) 25 mg capsule Take 25 mg by mouth 2 (two) times daily as needed for itching.    [provider]  EPINEPHrine  (EPIPEN  2-PAK) 0.3 mg/0.3 mL IJ SOAJ injection Inject 0.3 mLs (0.3 mg total) into the muscle once as needed for up to 1 dose (for severe allergic reaction). CAll 911 immediately if you have to use this medicine 09/24/18   Jarold Olam HERO, PA-C  estradiol  (CLIMARA  - DOSED IN MG/24 HR) 0.05 mg/24hr patch Place 0.05 mg onto the skin every Sunday.    [provider]  gabapentin  (NEURONTIN ) 300 MG capsule Take 300 mg by mouth at bedtime. 08/14/22   [provider]  HYDROcodone -acetaminophen  (NORCO) 7.5-325 MG tablet Take 1 tablet by mouth 3 (three) times daily as needed (pain.). 04/19/22   [provider]  hydrOXYzine  (ATARAX ) 50 MG tablet Take 50 mg by mouth every 8 (eight) hours as needed for anxiety.    [provider]  ketotifen  (ALLERGY EYE DROPS) 0.035 % ophthalmic solution Place 1 drop into both eyes in the morning and at bedtime.    [provider]  losartan  (COZAAR ) 100 MG tablet Take 100 mg by mouth in the morning.    [provider]  mirtazapine  (REMERON ) 15 MG tablet Take 15 mg by mouth at bedtime. 08/14/22   [provider]  Multiple Vitamins-Minerals (ADULT GUMMY PO)  Take 3 each by mouth in the morning.    [provider]  ondansetron  (ZOFRAN ) 4 MG tablet Take 1 tablet (4 mg total) by mouth every 8 (eight) hours as needed for nausea or vomiting. 08/14/22   Barrett, Erin R, PA-C  ondansetron  (ZOFRAN -ODT) 4 MG disintegrating tablet 4mg  ODT q4 hours prn nausea/vomit 12/10/23   Floyd, Dan, DO  pantoprazole  (PROTONIX ) 40 MG tablet TAKE 1 TABLET BY MOUTH TWICE A DAY 02/08/23   Armbruster, Elspeth SQUIBB, MD  RESTASIS  0.05 % ophthalmic emulsion Place 1 drop into both eyes 2 (two) times daily. 12/20/21   [provider]  rosuvastatin  (CRESTOR ) 5 MG tablet Take 5 mg by mouth in the morning. 10/13/21   [provider]  sucralfate  (CARAFATE ) 1 GM/10ML suspension Take 10 mLs (1 g total) by mouth 4 (four) times daily. 07/18/22   Armbruster, Elspeth SQUIBB, MD  XOLAIR 150 MG/ML prefilled syringe Inject 150 mg into the skin every 14 (fourteen) days. 07/31/22   [provider]    Allergies: Biaxin [clarithromycin], Morphine  and codeine, Percocet [oxycodone -acetaminophen ], Shrimp [shellfish allergy], Celexa [citalopram], and Topamax [topiramate]    Review of Systems  Constitutional:  Positive for fatigue. Negative for chills and fever.  HENT:  Positive for congestion. Negative for ear pain and sore throat.   Eyes:  Negative for pain and visual disturbance.  Respiratory:  Positive for cough and shortness of breath.   Cardiovascular:  Negative for chest pain and palpitations.  Gastrointestinal:  Negative for abdominal pain and vomiting.  Genitourinary:  Negative for dysuria and hematuria.  Musculoskeletal:  Negative for arthralgias and back pain.  Skin:  Negative for color change and rash.  Neurological:  Positive for headaches. Negative for seizures and syncope.  All other systems reviewed and are negative.   Updated Vital Signs BP (!) 150/94 (BP Location: Right Arm)   Pulse 94   Temp 98.2 F (36.8 C) (Oral)   Resp 18   Ht 5' 5 (1.651 m)   Wt 81.6 kg   LMP 02/13/2020 (Approximate)   SpO2 93%   BMI 29.95 kg/m   Physical Exam Vitals and nursing note reviewed.  Constitutional:      General: She is not in acute distress.    Appearance: She is well-developed. She is not ill-appearing.  HENT:     Head: Normocephalic and atraumatic.  Eyes:     Conjunctiva/sclera: Conjunctivae normal.  Cardiovascular:     Rate and Rhythm: Normal rate and regular rhythm.     Heart sounds: No murmur heard. Pulmonary:     Effort: Pulmonary effort is normal. No respiratory distress.     Breath sounds: Normal breath sounds. No decreased breath sounds, wheezing, rhonchi or rales.  Abdominal:      Palpations: Abdomen is soft.     Tenderness: There is no abdominal tenderness.  Musculoskeletal:        General: No swelling.     Cervical back: Neck supple.  Skin:    General: Skin is warm and dry.     Capillary Refill: Capillary refill takes less than 2 seconds.  Neurological:     Mental Status: She is alert.  Psychiatric:        Mood and Affect: Mood normal.     (all labs ordered are listed, but only abnormal results are displayed) Labs Reviewed  CBC WITH DIFFERENTIAL/PLATELET - Abnormal; Notable for the following components:      Result Value   RBC 5.23 (*)  All other components within normal limits  BASIC METABOLIC PANEL WITH GFR - Abnormal; Notable for the following components:   Potassium 3.0 (*)    All other components within normal limits  RESP PANEL BY RT-PCR (RSV, FLU A&B, COVID)  [id number]  PRO BRAIN NATRIURETIC PEPTIDE  TROPONIN T, HIGH SENSITIVITY  TROPONIN T, HIGH SENSITIVITY    EKG: EKG Interpretation Date/Time:  Saturday April 18 2024 18:57:05 EDT Ventricular Rate:  83 PR Interval:  160 QRS Duration:  94 QT Interval:  366 QTC Calculation: 430 R Axis:   48  Text Interpretation: Sinus rhythm no Acute changes when compared to prior EKG from 12/10/2023 Confirmed by Gennaro Bouchard ([id number]) on 04/18/2024 6:59:58 PM  Radiology: CT Chest Wo Contrast Result Date: 04/18/2024 CLINICAL DATA:  Shortness of breath, cough.  History of lung cancer. EXAM: CT CHEST WITHOUT CONTRAST TECHNIQUE: Multidetector CT imaging of the chest was performed following the standard protocol without IV contrast. RADIATION DOSE REDUCTION: This exam was performed according to the departmental dose-optimization program which includes automated exposure control, adjustment of the mA and/or kV according to patient size and/or use of iterative reconstruction technique. COMPARISON:  03/09/2024 FINDINGS: Cardiovascular: Heart is normal size. Aorta is normal caliber. Mediastinum/Nodes: Similar soft  tissue in the anterior mediastinum felt to reflect thymic rebound/hyperplasia. Small scattered mediastinal lymph nodes, none pathologically enlarged. No axillary adenopathy. Prior left thyroidectomy. Lungs/Pleura: Changes of prior right lower lobectomy. Previously seen nodularity posteriorly in the left lower lobe not definitively seen on today's study. There are mild ground-glass densities posteriorly in the left lower lobe felt to reflect dependent atelectasis. No confluent opacities, effusions or new pulmonary nodules. Upper Abdomen: No acute findings Musculoskeletal: Chest wall soft tissues are unremarkable. No acute bony abnormality. IMPRESSION: No acute cardiopulmonary disease. Previously seen dependent nodular density in the left lower lobe no longer visualized. Minimal dependent atelectasis in the left lower lobe on today's study. 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 Electronically Signed   By: Franky Crease M.D.   On: 04/18/2024 19:18   DG Chest 2 View Result Date: 04/18/2024 CLINICAL DATA:  Five week history of cough and shortness of breath EXAM: CHEST - 2 VIEW COMPARISON:  Chest radiograph dated 12/10/2023 FINDINGS: Unchanged asymmetric elevation of the right hemidiaphragm. Normal lung volumes. No focal consolidations. No pleural effusion or pneumothorax. The heart size and mediastinal contours are within normal limits. No acute osseous abnormality. IMPRESSION: 1. No active cardiopulmonary disease. 2. Unchanged asymmetric elevation of the right hemidiaphragm. Electronically Signed   By: Limin  Xu M.D.   On: 04/18/2024 17:36     Procedures   Medications Ordered in the ED  ketorolac  (TORADOL ) 15 MG/ML injection 15 mg (15 mg Intravenous Given 04/18/24 1833)  ipratropium-albuterol  (DUONEB) 0.5-2.5 (3) MG/3ML nebulizer solution 3 mL (3 mLs Nebulization Given 04/18/24 1834)  potassium chloride  SA (KLOR-CON  M) CR tablet 40 mEq (40 mEq Oral Given 04/18/24  1945)  predniSONE  (DELTASONE ) tablet 40 mg (40 mg Oral Given 04/18/24 1944)                                    Medical Decision Making Cardiac monitor interpretation: Sinus rhythm, no ectopy  Patient's imaging reviewed and unremarkable.  No evidence of pneumonia and just chronic lung changes on CT scan.  Her vitals are stable.  Lab workup was unremarkable.  Will be started on steroids and  give her prescription for Zyrtec .  Advised Flonase as prescribed continue albuterol  as needed.  Advise.  With her pulmonologist and primary care doctor and otherwise return to the ER for new or worsening symptoms.  Patient feels comfortable with the plan.  Discharged home.  Problems Addressed: Post-nasal drip: acute illness or injury Shortness of breath: undiagnosed new problem with uncertain prognosis  Amount and/or Complexity of Data Reviewed External Data Reviewed: notes.    Details: Prior outpatient records reviewed and patient was recently treated with steroids and antibiotics Labs: ordered. Decision-making details documented in ED Course.    Details: Ordered and reviewed by me and unremarkable Radiology: ordered and independent interpretation performed. Decision-making details documented in ED Course.    Details: Chest x-ray ordered and interpreted by me independently radiology and shows no acute abnormality in the chest  CT chest: Ordered and reviewed by me shows some evidence of chronic scarring but no other acute abnormality ECG/medicine tests: ordered and independent interpretation performed. Decision-making details documented in ED Course.    Details: Ordered and interpreted by me in the absence of cardiology shows sinus rhythm, no STEMI or acute change when compared to prior EKG  Risk OTC drugs. Prescription drug management.     Final diagnoses:  Shortness of breath  Post-nasal drip    ED Discharge Orders          Ordered    predniSONE  (DELTASONE ) 10 MG tablet  Daily         04/18/24 1929    cetirizine  (ZYRTEC  ALLERGY) 10 MG tablet  Daily        04/18/24 1929               Gennaro Duwaine CROME, DO 04/18/24 2227

## 2024-04-18 NOTE — ED Triage Notes (Signed)
 Pt presents to ED from home C/O SOB, productive cough X 5 weeks. States she has taken abx, steroids, inhalers, nebulizers, and it just keeps coming back.

## 2024-07-06 ENCOUNTER — Emergency Department (HOSPITAL_COMMUNITY): Admission: EM | Admit: 2024-07-06 | Discharge: 2024-07-06 | Discharge status: Against Medical Advice

## 2024-09-08 ENCOUNTER — Ambulatory Visit (HOSPITAL_COMMUNITY)
Admission: RE | Admit: 2024-09-08 | Discharge: 2024-09-08 | Disposition: A | Source: Ambulatory Visit | Attending: Internal Medicine | Admitting: Internal Medicine

## 2024-09-08 ENCOUNTER — Inpatient Hospital Stay: Attending: Internal Medicine

## 2024-09-08 ENCOUNTER — Encounter (HOSPITAL_COMMUNITY): Payer: Self-pay

## 2024-09-08 DIAGNOSIS — Z902 Acquired absence of lung [part of]: Secondary | ICD-10-CM | POA: Insufficient documentation

## 2024-09-08 DIAGNOSIS — C3490 Malignant neoplasm of unspecified part of unspecified bronchus or lung: Secondary | ICD-10-CM | POA: Insufficient documentation

## 2024-09-08 DIAGNOSIS — Z85118 Personal history of other malignant neoplasm of bronchus and lung: Secondary | ICD-10-CM | POA: Diagnosis present

## 2024-09-08 LAB — CMP (CANCER CENTER ONLY)
ALT: 15 U/L (ref 0–44)
AST: 16 U/L (ref 15–41)
Albumin: 4 g/dL (ref 3.5–5.0)
Alkaline Phosphatase: 109 U/L (ref 38–126)
Anion gap: 9 (ref 5–15)
BUN: 9 mg/dL (ref 6–20)
CO2: 28 mmol/L (ref 22–32)
Calcium: 8.8 mg/dL — ABNORMAL LOW (ref 8.9–10.3)
Chloride: 105 mmol/L (ref 98–111)
Creatinine: 0.78 mg/dL (ref 0.44–1.00)
GFR, Estimated: >60 mL/min
Glucose, Bld: 105 mg/dL — ABNORMAL HIGH (ref 70–99)
Potassium: 3.5 mmol/L (ref 3.5–5.1)
Sodium: 141 mmol/L (ref 135–145)
Total Bilirubin: 0.2 mg/dL (ref 0.0–1.2)
Total Protein: 7.1 g/dL (ref 6.5–8.1)

## 2024-09-08 LAB — CBC WITH DIFFERENTIAL (CANCER CENTER ONLY)
Abs Immature Granulocytes: 0.01 10*3/uL (ref 0.00–0.07)
Basophils Absolute: 0 10*3/uL (ref 0.0–0.1)
Basophils Relative: 0 %
Eosinophils Absolute: 0.2 10*3/uL (ref 0.0–0.5)
Eosinophils Relative: 2 %
HCT: 43.8 % (ref 36.0–46.0)
Hemoglobin: 14.5 g/dL (ref 12.0–15.0)
Immature Granulocytes: 0 %
Lymphocytes Relative: 24 %
Lymphs Abs: 1.8 10*3/uL (ref 0.7–4.0)
MCH: 26.8 pg (ref 26.0–34.0)
MCHC: 33.1 g/dL (ref 30.0–36.0)
MCV: 81 fL (ref 80.0–100.0)
Monocytes Absolute: 0.5 10*3/uL (ref 0.1–1.0)
Monocytes Relative: 7 %
Neutro Abs: 4.9 10*3/uL (ref 1.7–7.7)
Neutrophils Relative %: 67 %
Platelet Count: 228 10*3/uL (ref 150–400)
RBC: 5.41 MIL/uL — ABNORMAL HIGH (ref 3.87–5.11)
RDW: 14.3 % (ref 11.5–15.5)
WBC Count: 7.4 10*3/uL (ref 4.0–10.5)
nRBC: 0 % (ref 0.0–0.2)

## 2024-09-08 MED ORDER — IOHEXOL 300 MG/ML  SOLN
75.0000 mL | Freq: Once | INTRAMUSCULAR | Status: AC | PRN
  Administered 2024-09-08: 75 mL via INTRAVENOUS

## 2024-09-15 ENCOUNTER — Inpatient Hospital Stay: Admitting: Internal Medicine

## 2024-09-15 VITALS — BP 152/91 | HR 99 | Temp 98.1°F | Resp 17 | Ht 65.0 in | Wt 178.0 lb

## 2024-09-15 DIAGNOSIS — C3490 Malignant neoplasm of unspecified part of unspecified bronchus or lung: Secondary | ICD-10-CM

## 2025-09-08 ENCOUNTER — Inpatient Hospital Stay

## 2025-09-15 ENCOUNTER — Inpatient Hospital Stay: Admitting: Internal Medicine
